# Patient Record
Sex: Female | Born: 1955 | Race: White | Hispanic: No | Smoking: Never smoker
Health system: Southern US, Community
[De-identification: ages and names within clinical notes are randomized; demographics above are authoritative.]

## PROBLEM LIST (undated history)

## (undated) DIAGNOSIS — F32A Depression, unspecified: Secondary | ICD-10-CM

## (undated) DIAGNOSIS — F419 Anxiety disorder, unspecified: Secondary | ICD-10-CM

## (undated) DIAGNOSIS — E119 Type 2 diabetes mellitus without complications: Secondary | ICD-10-CM

## (undated) DIAGNOSIS — E785 Hyperlipidemia, unspecified: Secondary | ICD-10-CM

## (undated) DIAGNOSIS — T7840XA Allergy, unspecified, initial encounter: Secondary | ICD-10-CM

## (undated) DIAGNOSIS — Z5189 Encounter for other specified aftercare: Secondary | ICD-10-CM

## (undated) DIAGNOSIS — E1122 Type 2 diabetes mellitus with diabetic chronic kidney disease: Secondary | ICD-10-CM

## (undated) DIAGNOSIS — M1990 Unspecified osteoarthritis, unspecified site: Secondary | ICD-10-CM

## (undated) DIAGNOSIS — J45909 Unspecified asthma, uncomplicated: Secondary | ICD-10-CM

## (undated) DIAGNOSIS — F329 Major depressive disorder, single episode, unspecified: Secondary | ICD-10-CM

## (undated) DIAGNOSIS — R42 Dizziness and giddiness: Secondary | ICD-10-CM

## (undated) DIAGNOSIS — N1831 Chronic kidney disease, stage 3a: Secondary | ICD-10-CM

## (undated) DIAGNOSIS — G43909 Migraine, unspecified, not intractable, without status migrainosus: Secondary | ICD-10-CM

## (undated) DIAGNOSIS — M810 Age-related osteoporosis without current pathological fracture: Secondary | ICD-10-CM

## (undated) HISTORY — DX: Anxiety disorder, unspecified: F41.9

## (undated) HISTORY — PX: CHOLECYSTECTOMY: SHX55

## (undated) HISTORY — PX: FEMUR FRACTURE SURGERY: SHX633

## (undated) HISTORY — DX: Allergy, unspecified, initial encounter: T78.40XA

## (undated) HISTORY — DX: Age-related osteoporosis without current pathological fracture: M81.0

## (undated) HISTORY — DX: Encounter for other specified aftercare: Z51.89

## (undated) HISTORY — DX: Hyperlipidemia, unspecified: E78.5

## (undated) HISTORY — DX: Major depressive disorder, single episode, unspecified: F32.9

## (undated) HISTORY — PX: TUBAL LIGATION: SHX77

## (undated) HISTORY — PX: ABDOMINAL HYSTERECTOMY: SHX81

## (undated) HISTORY — PX: JOINT REPLACEMENT: SHX530

## (undated) HISTORY — DX: Depression, unspecified: F32.A

## (undated) HISTORY — PX: ARTHROPLASTY: SHX135

## (undated) HISTORY — PX: TOTAL KNEE ARTHROPLASTY: SHX125

---

## 2007-01-10 ENCOUNTER — Ambulatory Visit: Payer: Self-pay

## 2008-02-16 ENCOUNTER — Ambulatory Visit: Payer: Self-pay | Admitting: Family Medicine

## 2008-03-25 ENCOUNTER — Ambulatory Visit: Payer: Self-pay | Admitting: Family Medicine

## 2009-08-03 ENCOUNTER — Ambulatory Visit: Payer: Self-pay

## 2009-08-05 DIAGNOSIS — R0602 Shortness of breath: Secondary | ICD-10-CM | POA: Insufficient documentation

## 2009-10-04 ENCOUNTER — Observation Stay: Payer: Self-pay | Admitting: Internal Medicine

## 2010-06-16 ENCOUNTER — Ambulatory Visit: Payer: Self-pay | Admitting: Family Medicine

## 2010-10-05 ENCOUNTER — Ambulatory Visit: Payer: Self-pay | Admitting: Family Medicine

## 2010-12-30 ENCOUNTER — Emergency Department: Payer: Self-pay | Admitting: Emergency Medicine

## 2011-02-21 ENCOUNTER — Encounter: Payer: Self-pay | Admitting: Orthopedic Surgery

## 2011-08-01 ENCOUNTER — Ambulatory Visit: Payer: Self-pay | Admitting: Family Medicine

## 2011-11-15 ENCOUNTER — Ambulatory Visit: Payer: Self-pay | Admitting: Otolaryngology

## 2011-12-14 ENCOUNTER — Ambulatory Visit: Payer: Self-pay | Admitting: Family Medicine

## 2013-10-23 IMAGING — CR DG FEMUR 2V*L*
1 series · 4 of 4 positions shown · non-contrast
Comparison: none

REASON FOR EXAM: pain, twistiing injury
COMMENTS:

[Series 1: view not recorded · 0.17mm/px · 4 of 4 slices shown]
[im 1/4]
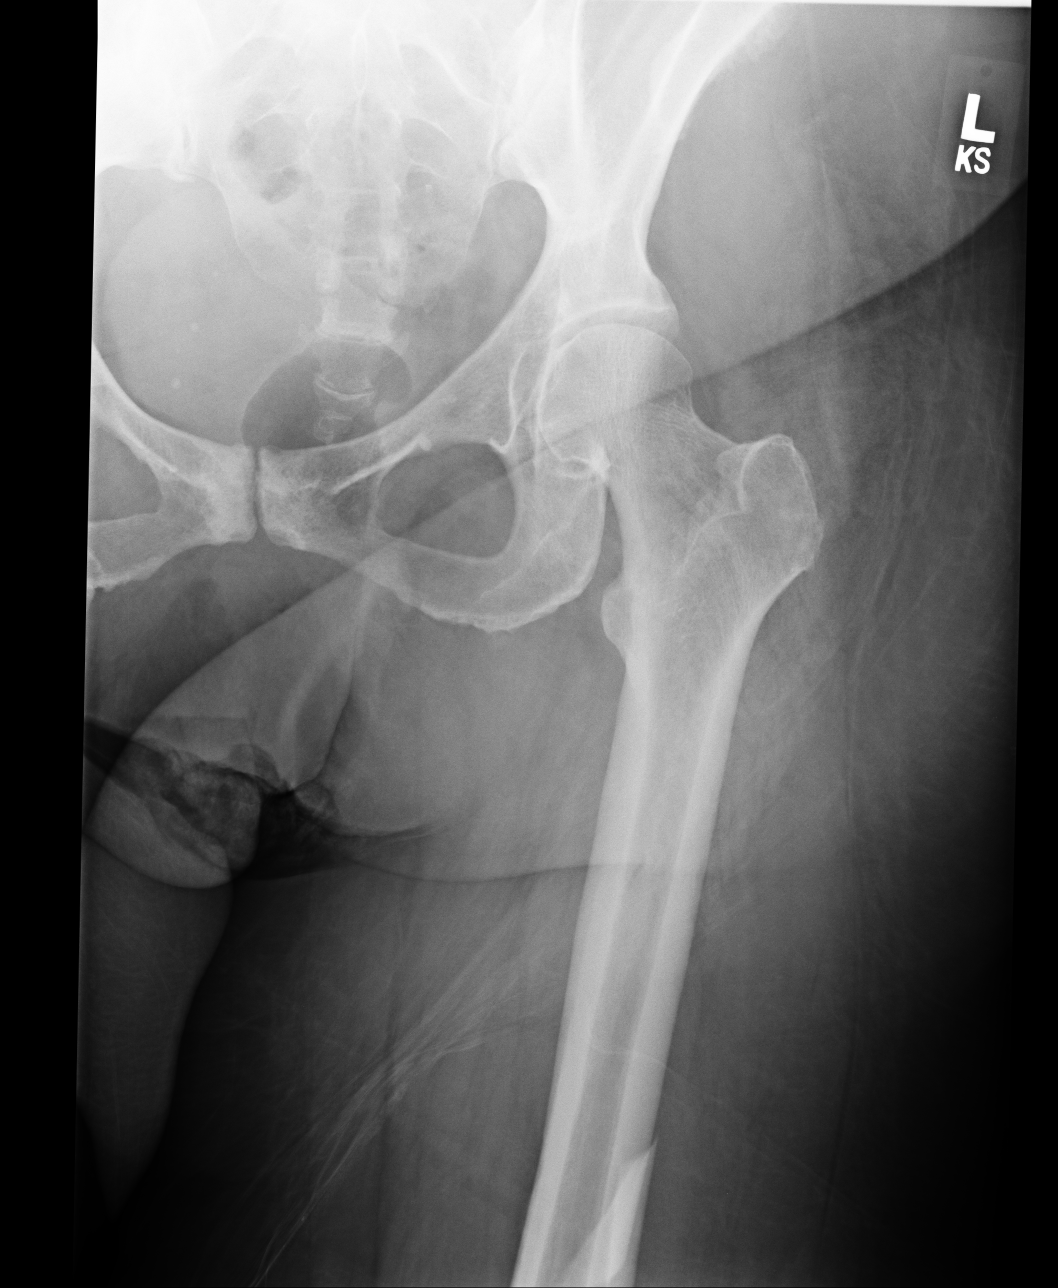
[im 2/4]
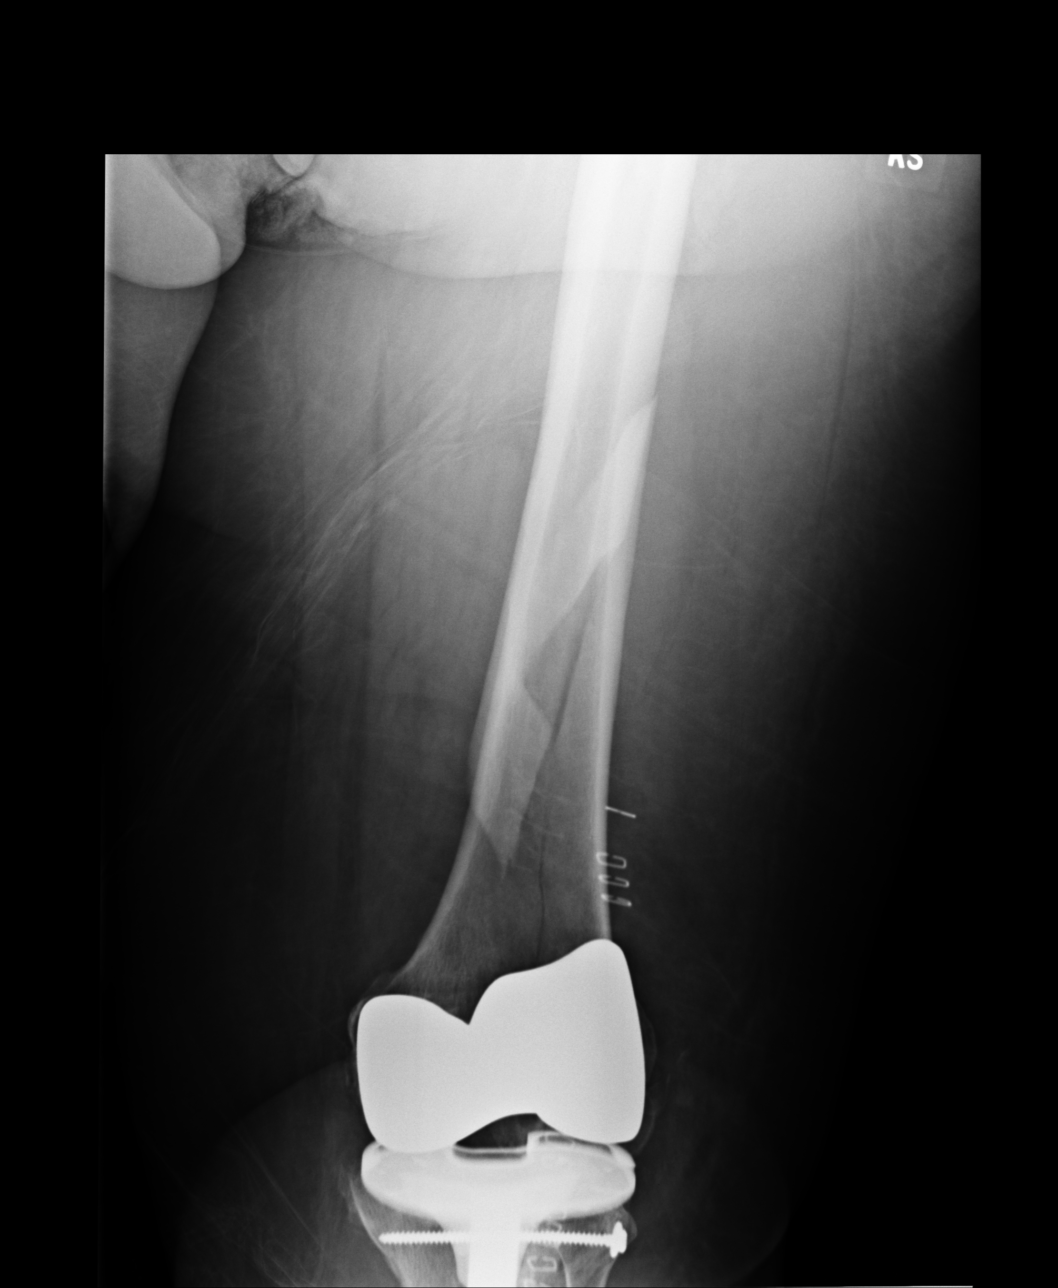
[im 3/4]
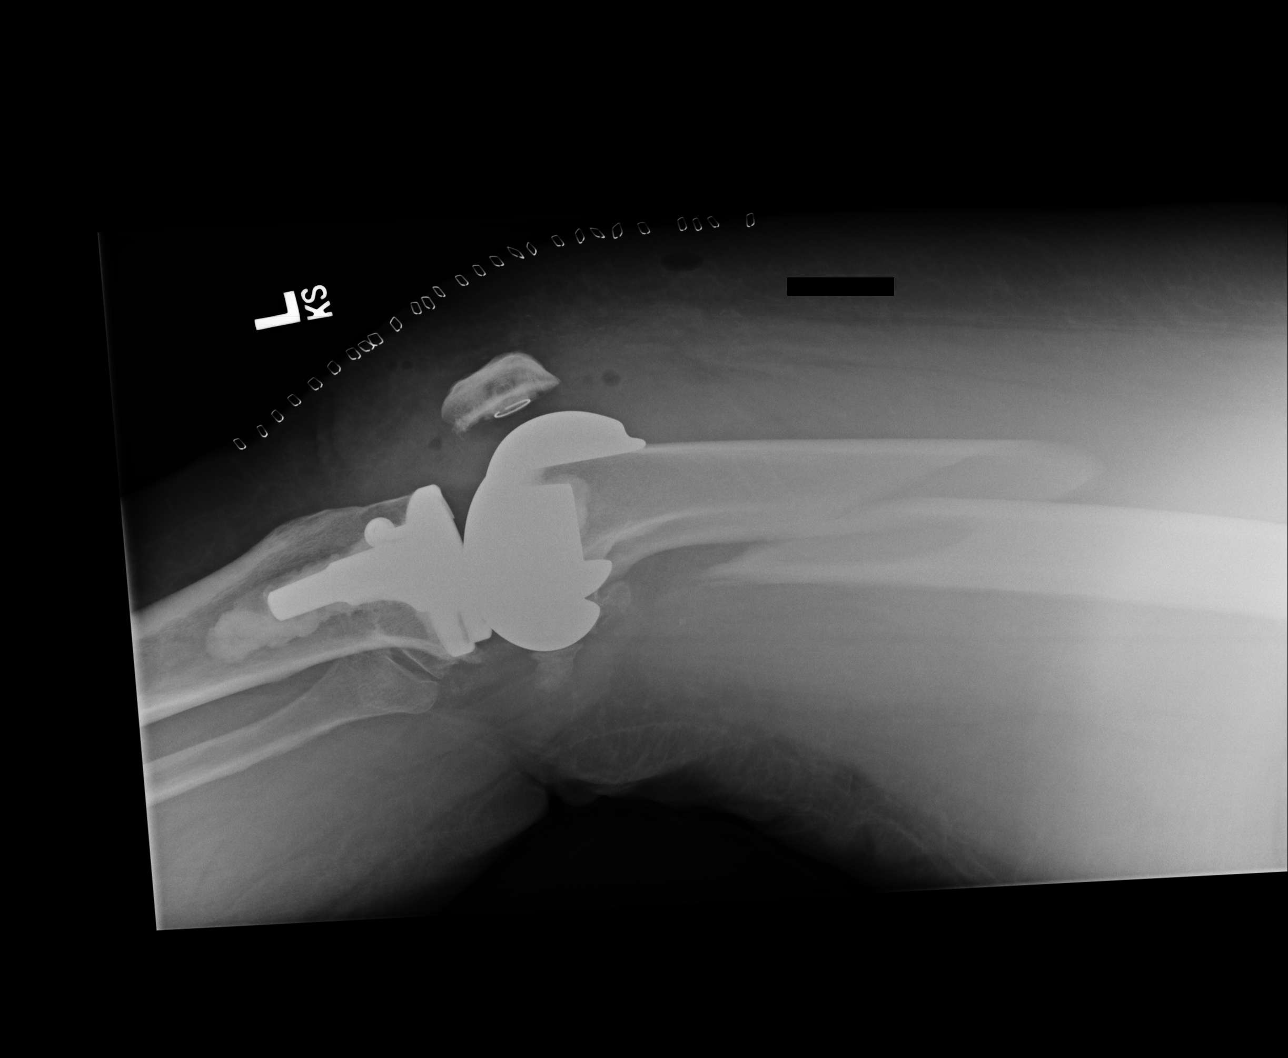
[im 4/4]
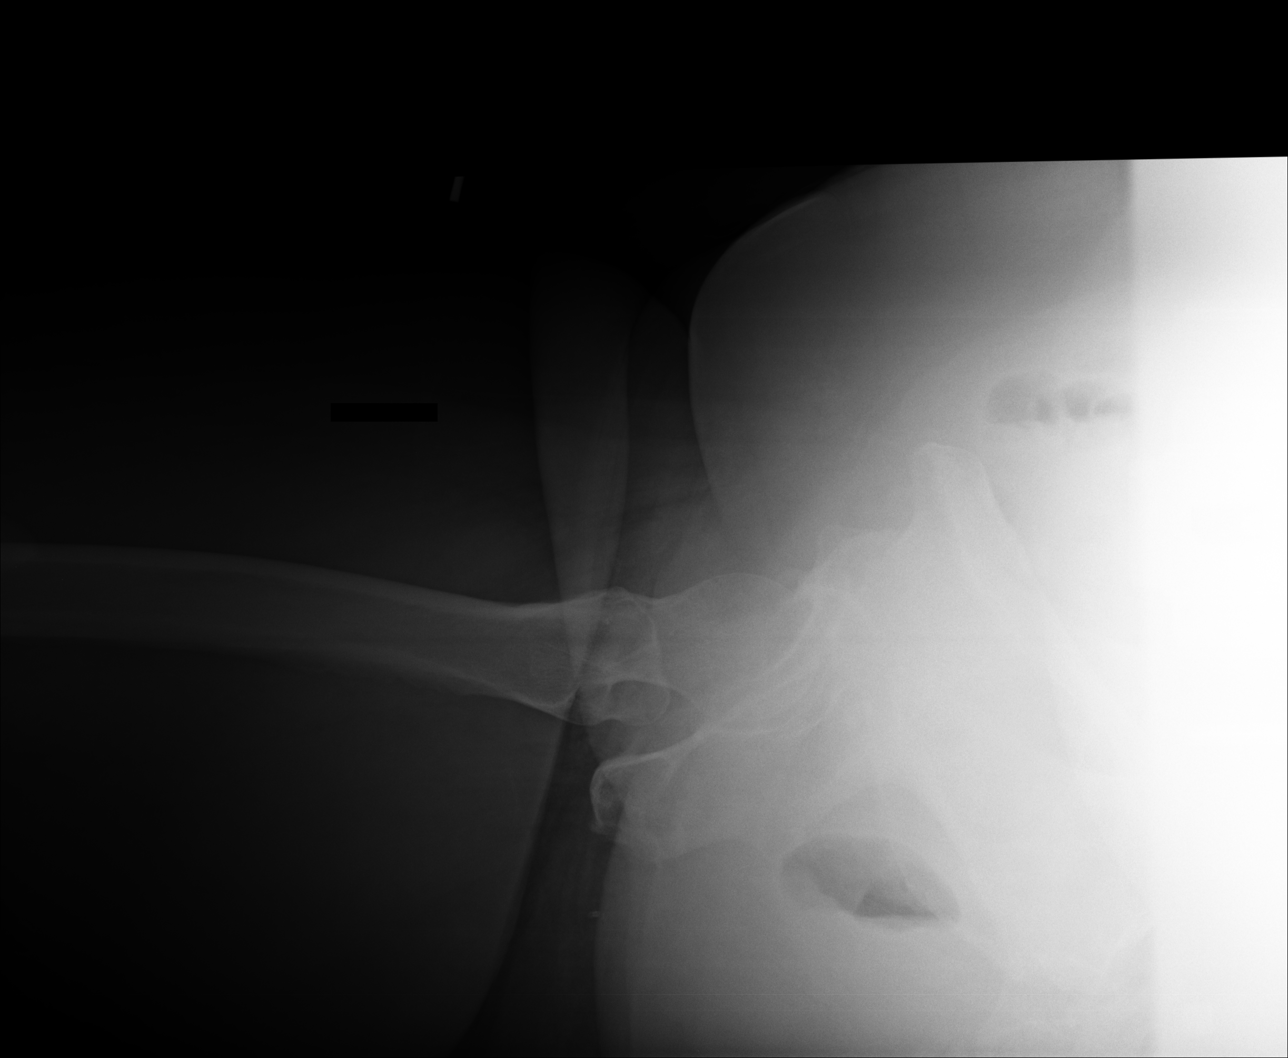

[4 of 4 positions shown; findings below may reference images not displayed]

PROCEDURE:     DXR - DXR FEMUR LEFT  - December 30, 2010  [DATE]

RESULT:     An oblique comminuted distal femoral fracture is identified. A
vertical component extends to the level of the patient's knee prosthesis
lateral femoral condylar component. There does appear to a component of
override. There is no significant displacement or angulation.
IMPRESSION: Distal left femoral fracture. A component of the fracture extends to the
level of the patient's knee prosthesis.

## 2013-10-23 IMAGING — CR DG KNEE COMPLETE 4+V*L*
1 series · 4 of 4 positions shown · non-contrast
Comparison: none

REASON FOR EXAM: pain, twisting injury
COMMENTS:   May transport without cardiac monitor

[Series 1: view not recorded · 0.17mm/px · 4 of 4 slices shown]
[im 1/4]
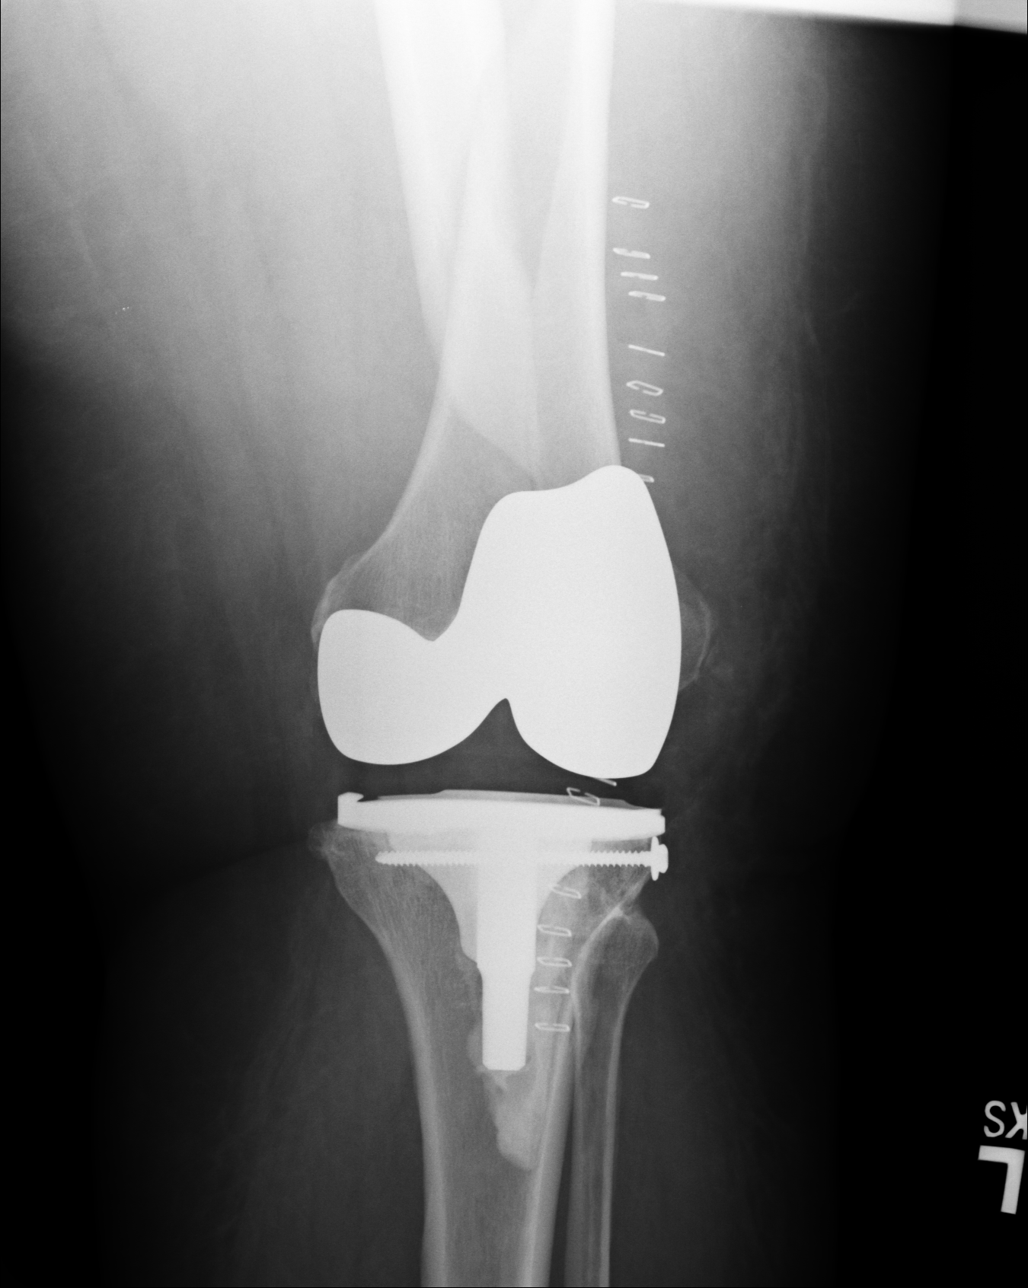
[im 2/4]
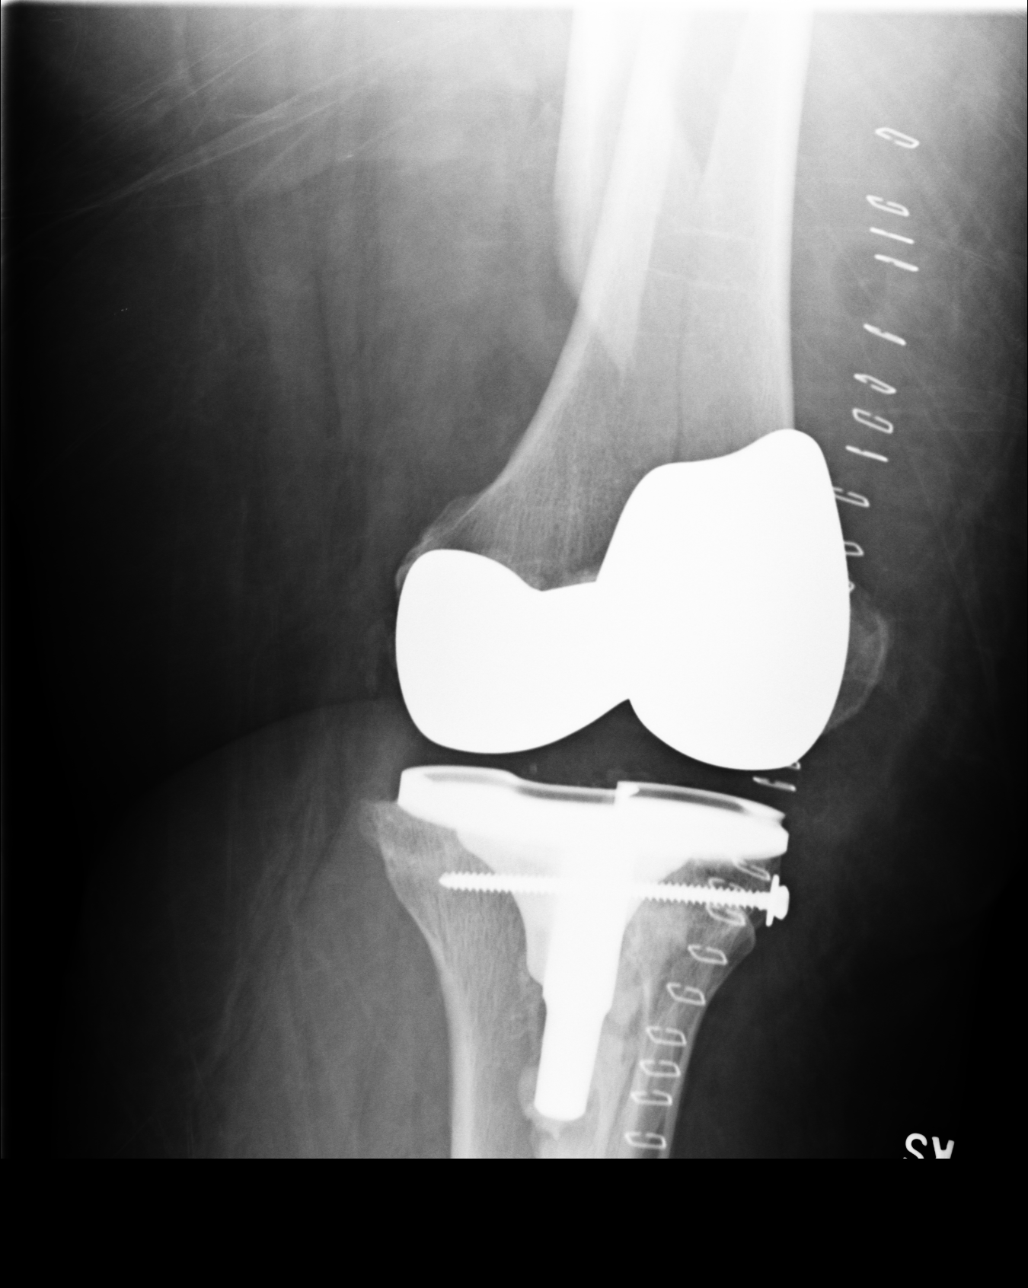
[im 3/4]
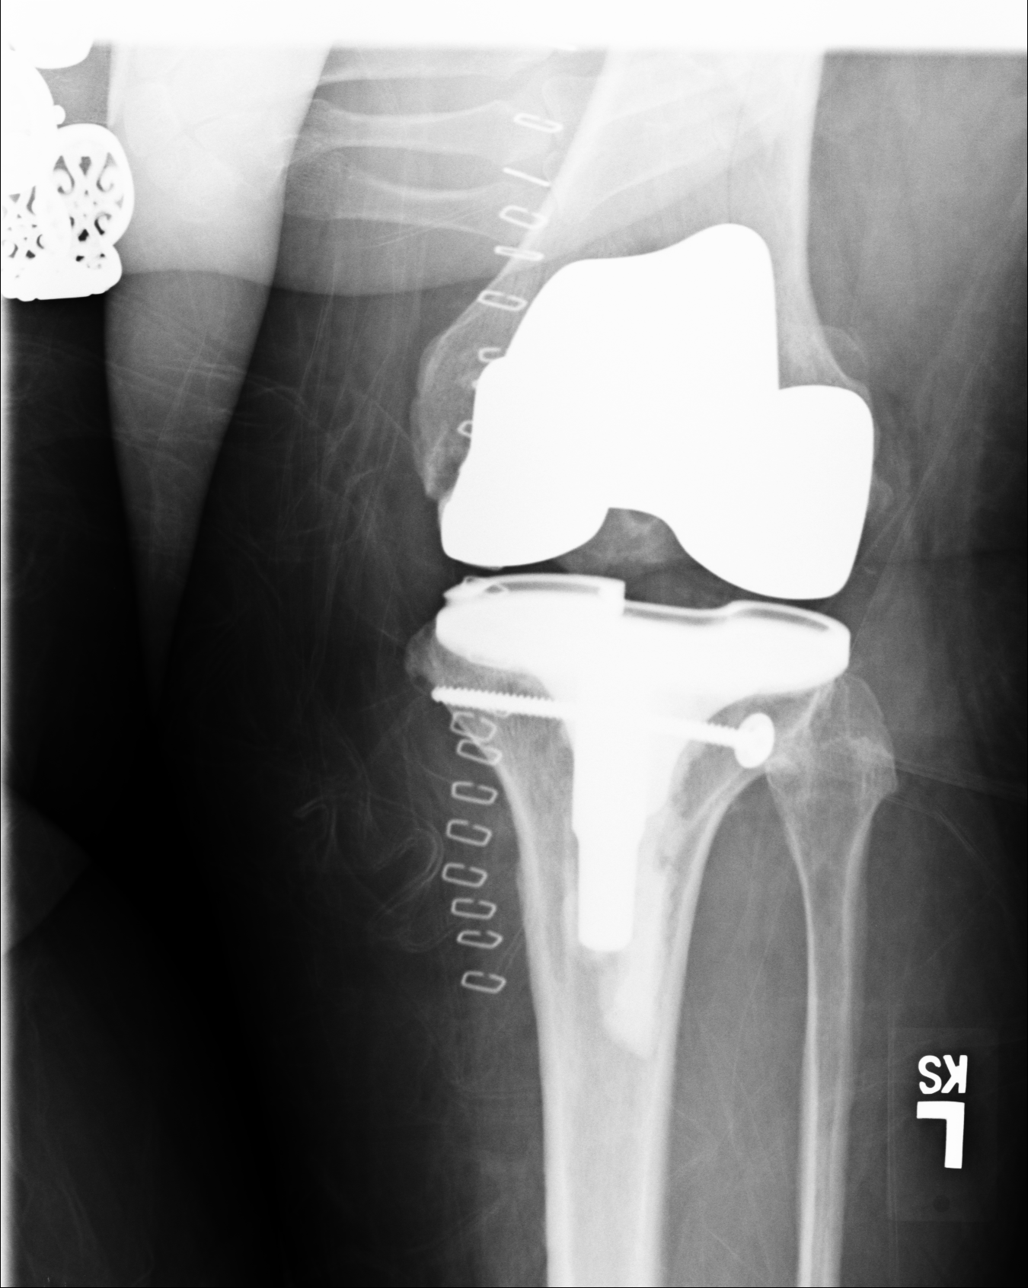
[im 4/4]
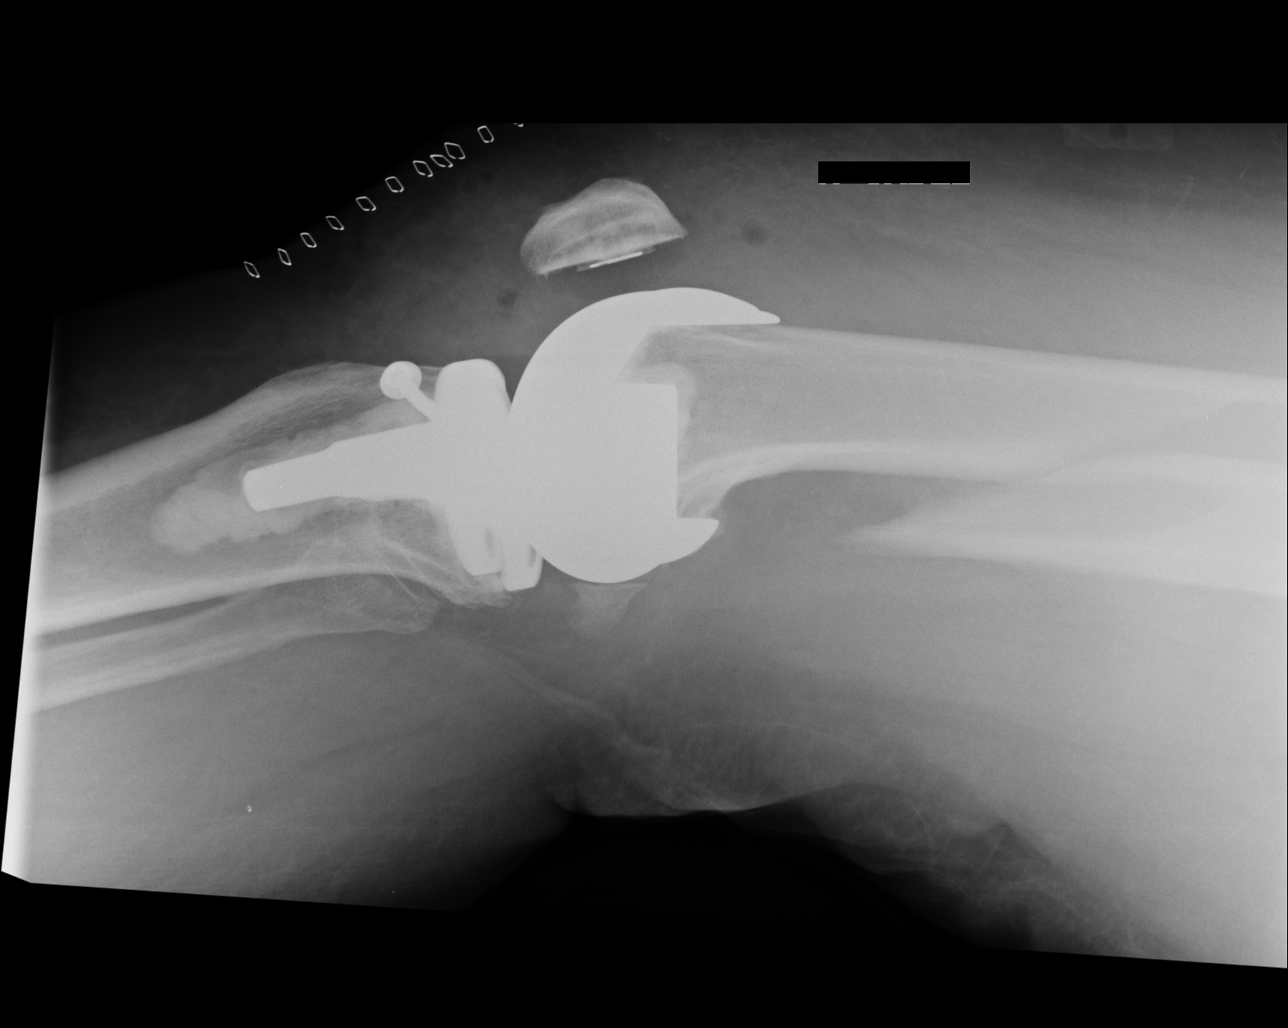

[4 of 4 positions shown; findings below may reference images not displayed]

PROCEDURE:     DXR - DXR KNEE LT COMP WITH OBLIQUES  - December 30, 2010  [DATE]

RESULT:     The patient is status post total knee prostheses. A distal
femoral shaft fracture is partially visualized. A fracture line extends into
the lateral femoral condylar component of the prosthesis. Hardware otherwise
appears intact without evidence of loosening or failure. Skin staples are
identified about the left knee.
IMPRESSION: Partial visualized distal femoral shaft fracture. Left knee otherwise
appears to be intact.

## 2014-08-02 DIAGNOSIS — F329 Major depressive disorder, single episode, unspecified: Secondary | ICD-10-CM | POA: Insufficient documentation

## 2014-08-02 DIAGNOSIS — J45909 Unspecified asthma, uncomplicated: Secondary | ICD-10-CM | POA: Insufficient documentation

## 2014-08-02 DIAGNOSIS — R61 Generalized hyperhidrosis: Secondary | ICD-10-CM | POA: Insufficient documentation

## 2014-08-02 DIAGNOSIS — F419 Anxiety disorder, unspecified: Secondary | ICD-10-CM | POA: Insufficient documentation

## 2014-08-02 DIAGNOSIS — G4700 Insomnia, unspecified: Secondary | ICD-10-CM | POA: Insufficient documentation

## 2014-08-02 DIAGNOSIS — Z8659 Personal history of other mental and behavioral disorders: Secondary | ICD-10-CM | POA: Insufficient documentation

## 2014-08-02 DIAGNOSIS — O039 Complete or unspecified spontaneous abortion without complication: Secondary | ICD-10-CM | POA: Insufficient documentation

## 2014-08-02 DIAGNOSIS — E785 Hyperlipidemia, unspecified: Secondary | ICD-10-CM | POA: Insufficient documentation

## 2014-08-02 DIAGNOSIS — R7303 Prediabetes: Secondary | ICD-10-CM | POA: Insufficient documentation

## 2014-08-02 DIAGNOSIS — E7800 Pure hypercholesterolemia, unspecified: Secondary | ICD-10-CM | POA: Insufficient documentation

## 2014-08-02 DIAGNOSIS — E559 Vitamin D deficiency, unspecified: Secondary | ICD-10-CM | POA: Insufficient documentation

## 2014-08-02 DIAGNOSIS — R091 Pleurisy: Secondary | ICD-10-CM | POA: Insufficient documentation

## 2014-08-03 ENCOUNTER — Encounter: Payer: Self-pay | Admitting: Physician Assistant

## 2014-08-03 ENCOUNTER — Ambulatory Visit (INDEPENDENT_AMBULATORY_CARE_PROVIDER_SITE_OTHER): Payer: BLUE CROSS/BLUE SHIELD | Admitting: Physician Assistant

## 2014-08-03 VITALS — BP 132/74 | HR 102 | Temp 98.3°F | Resp 16 | Wt 246.2 lb

## 2014-08-03 DIAGNOSIS — R399 Unspecified symptoms and signs involving the genitourinary system: Secondary | ICD-10-CM | POA: Diagnosis not present

## 2014-08-03 DIAGNOSIS — J4521 Mild intermittent asthma with (acute) exacerbation: Secondary | ICD-10-CM

## 2014-08-03 LAB — POCT URINALYSIS DIPSTICK
Bilirubin, UA: NEGATIVE
Blood, UA: NEGATIVE
Glucose, UA: NEGATIVE
KETONES UA: NEGATIVE
Leukocytes, UA: NEGATIVE
NITRITE UA: NEGATIVE
PH UA: 6.5
Protein, UA: NEGATIVE
Urobilinogen, UA: 0.2

## 2014-08-03 MED ORDER — ALBUTEROL SULFATE HFA 108 (90 BASE) MCG/ACT IN AERS
2.0000 | INHALATION_SPRAY | RESPIRATORY_TRACT | Status: DC | PRN
Start: 2014-08-03 — End: 2014-12-09

## 2014-08-03 MED ORDER — HYDROCODONE-HOMATROPINE 5-1.5 MG/5ML PO SYRP: 5.0000 mL | ORAL_SOLUTION | Freq: Three times a day (TID) | ORAL | Status: DC | PRN

## 2014-08-03 MED ORDER — CIPROFLOXACIN HCL 500 MG PO TABS: 500.0000 mg | ORAL_TABLET | Freq: Two times a day (BID) | ORAL | Status: DC

## 2014-08-03 NOTE — Progress Notes (Signed)
Subjective:    Patient ID: Joanna Bishop, female    DOB: 09/24/1955, 59 y.o.   MRN: [id number]  Urinary Tract Infection  This is a new problem. The current episode started yesterday. The problem occurs every urination. The problem has been gradually improving. The quality of the pain is described as aching (foul odor and back pain). The pain is at a severity of 3/10. The pain is mild. The maximum temperature recorded prior to her arrival was 101 - 101.9 F. The fever has been present for less than 1 day. She is sexually active. There is no history of pyelonephritis. Pertinent negatives include no chills, discharge, flank pain, frequency, hematuria, hesitancy, nausea, possible pregnancy, sweats, urgency or vomiting. Associated symptoms comments: Foul odor. She has tried increased fluids for the symptoms. The treatment provided mild relief. There is no history of catheterization, kidney stones, recurrent UTIs, a single kidney, urinary stasis or a urological procedure.  Cough This is a chronic problem. The current episode started more than 1 year ago. The problem has been gradually worsening. The problem occurs every few minutes. The cough is productive of purulent sputum. Associated symptoms include a fever, shortness of breath and wheezing. Pertinent negatives include no chest pain, chills, ear congestion, ear pain, headaches, heartburn, hemoptysis, myalgias, nasal congestion, postnasal drip, rash, rhinorrhea, sore throat, sweats or weight loss. The symptoms are aggravated by lying down. She has tried a beta-agonist inhaler and rest for the symptoms. The treatment provided mild relief. Her past medical history is significant for asthma, bronchitis and environmental allergies. There is no history of bronchiectasis, COPD, emphysema or pneumonia.      Review of Systems  Constitutional: Positive for fever. Negative for chills and weight loss.  HENT: Negative for ear pain, postnasal drip, rhinorrhea and  sore throat.   Respiratory: Positive for cough, shortness of breath and wheezing. Negative for hemoptysis.   Cardiovascular: Negative for chest pain.  Gastrointestinal: Negative for heartburn, nausea and vomiting.  Genitourinary: Negative for hesitancy, urgency, frequency, hematuria and flank pain.  Musculoskeletal: Positive for back pain (low back pain with urination). Negative for myalgias.  Skin: Negative for rash.  Allergic/Immunologic: Positive for environmental allergies.  Neurological: Negative for weakness, numbness and headaches.       Objective:   Physical Exam  Constitutional: She appears well-developed and well-nourished. No distress.  HENT:  Head: Normocephalic and atraumatic.  Right Ear: Hearing, tympanic membrane, external ear and ear canal normal.  Left Ear: Hearing, tympanic membrane, external ear and ear canal normal.  Nose: Nose normal. Right sinus exhibits no maxillary sinus tenderness and no frontal sinus tenderness. Left sinus exhibits no maxillary sinus tenderness and no frontal sinus tenderness.  Mouth/Throat: Uvula is midline, oropharynx is clear and moist and mucous membranes are normal. No oropharyngeal exudate.  Neck: Normal range of motion. Neck supple. No tracheal deviation present.  Cardiovascular: Regular rhythm.  Tachycardia present.  Exam reveals no gallop and no friction rub.   No murmur heard. Pulmonary/Chest: Effort normal. No stridor. No respiratory distress. She has no wheezes. She has rales (RUL). She exhibits no tenderness.  Lymphadenopathy:    She has no cervical adenopathy.  Skin: She is not diaphoretic.  Vitals reviewed.         Assessment & Plan:  1. AB (asthmatic bronchitis), mild intermittent, with acute exacerbation Worsening with increased sputum production.  Will treat as below.  She is to call the office if symptoms fail to improve.  - albuterol (PROVENTIL  HFA;VENTOLIN HFA) 108 (90 BASE) MCG/ACT inhaler; Inhale 2 puffs into the  lungs every 4 (four) hours as needed for wheezing or shortness of breath. Every 4-6 hours as needed  Dispense: 1 Inhaler; Refill: 6 - HYDROcodone-homatropine (HYCODAN) 5-1.5 MG/5ML syrup; Take 5 mLs by mouth every 8 (eight) hours as needed for cough.  Dispense: 120 mL; Refill: 0 - ciprofloxacin (CIPRO) 500 MG tablet; Take 1 tablet (500 mg total) by mouth 2 (two) times daily.  Dispense: 20 tablet; Refill: 0  2. Urinary tract infection symptoms UA was negative for UTI.  Will treat with cipro as below due to being symptomatic.  - ciprofloxacin (CIPRO) 500 MG tablet; Take 1 tablet (500 mg total) by mouth 2 (two) times daily.  Dispense: 20 tablet; Refill: 0 - POCT Urinalysis Dipstick

## 2014-08-03 NOTE — Patient Instructions (Signed)
Chronic Asthmatic Bronchitis Chronic asthmatic bronchitis is a complication of persistent asthma. After a period of time with asthma, some people develop airflow obstruction that is present all the time, even when not having an asthma attack.There is also persistent inflammation of the airways, and the bronchial tubes produce more mucus. Chronic asthmatic bronchitis usually is a permanent problem with the lungs. CAUSES  Chronic asthmatic bronchitis happens most often in people who have asthma and also smoke cigarettes. Occasionally, it can happen to a person with long-standing or severe asthma even if the person is not a smoker. SIGNS AND SYMPTOMS  Chronic asthmatic bronchitis usually causes symptoms of both asthma and chronic bronchitis, including:   Coughing.  Increased sputum production.  Wheezing and shortness of breath.  Chest discomfort.  Recurring infections. DIAGNOSIS  Your health care provider will take a medical history and perform a physical exam. Chronic asthmatic bronchitis is suspected when a person with asthma has abnormal results on breathing tests (pulmonary function tests) even when breathing symptoms are at their best. Other tests, such as a chest X-ray, may be performed to rule out other conditions.  TREATMENT  Treatment involves controlling symptoms with medicine and lifestyle changes.  Your health care provider may prescribe asthma medicines, including inhaler and nebulizer medicines.  Infection can be treated with medicine to kill germs (antibiotics). Serious infections may require hospitalization. These can include:  Pneumonia.  Sinus infections.  Acute bronchitis.   Preventing infection and hospitalization is very important. Get an influenza vaccination every year as directed by your health care provider. Ask your health care provider whether you need a pneumonia vaccine.  Ask your health care provider whether you would benefit from a pulmonary  rehabilitation program. HOME CARE INSTRUCTIONS  Take medicines only as directed by your health care provider.  If you are a cigarette smoker, the most important thing that you can do is quit. Talk to your health care provider for help with quitting smoking.  Avoid pollen, dust, animal dander, molds, smoke, and other things that cause attacks.  Regular exercise is very important to help you feel better. Discuss possible exercise routines with your health care provider.  If animal dander is the cause of asthma, you may not be able to keep pets.  It is important that you:  Become educated about your medical condition.  Participate in maintaining wellness.  Seek medical care as directed. Delay in seeking medical care could cause permanent injury and may be a risk to your life. SEEK MEDICAL CARE IF:  You have wheezing and shortness of breath even if taking medicine to prevent attacks.  You have muscle aches, chest pain, or thickening of sputum.  Your sputum changes from clear or white to yellow, green, gray, or bloody. SEEK IMMEDIATE MEDICAL CARE IF:  Your usual medicines do not stop your wheezing.  You have increased coughing or shortness of breath or both.  You have increased difficulty breathing.  You have any problems from the medicine you are taking, such as a rash, itching, swelling, or trouble breathing. MAKE SURE YOU:   Understand these instructions.  Will watch your condition.  Will get help right away if you are not doing well or get worse. Document Released: 11/30/2005 Document Revised: 06/29/2013 Document Reviewed: 03/23/2013 [employer] Patient Information 2015 [employer]. This information is not intended to replace advice given to you by your health care provider. Make sure you discuss any questions you have with your health care provider.  

## 2014-08-06 ENCOUNTER — Other Ambulatory Visit: Payer: Self-pay

## 2014-08-09 MED ORDER — SERTRALINE HCL 100 MG PO TABS: 150.0000 mg | ORAL_TABLET | Freq: Every day | ORAL | Status: DC

## 2014-09-09 ENCOUNTER — Encounter: Payer: BLUE CROSS/BLUE SHIELD | Admitting: Physician Assistant

## 2014-09-10 ENCOUNTER — Encounter: Payer: Self-pay | Admitting: Physician Assistant

## 2014-09-10 ENCOUNTER — Ambulatory Visit (INDEPENDENT_AMBULATORY_CARE_PROVIDER_SITE_OTHER): Payer: BLUE CROSS/BLUE SHIELD | Admitting: Physician Assistant

## 2014-09-10 VITALS — BP 102/60 | HR 80 | Temp 97.9°F | Resp 20 | Wt 246.0 lb

## 2014-09-10 DIAGNOSIS — E7800 Pure hypercholesterolemia, unspecified: Secondary | ICD-10-CM

## 2014-09-10 DIAGNOSIS — Z1211 Encounter for screening for malignant neoplasm of colon: Secondary | ICD-10-CM

## 2014-09-10 DIAGNOSIS — Z0000 Encounter for general adult medical examination without abnormal findings: Secondary | ICD-10-CM

## 2014-09-10 DIAGNOSIS — R7303 Prediabetes: Secondary | ICD-10-CM

## 2014-09-10 DIAGNOSIS — R7309 Other abnormal glucose: Secondary | ICD-10-CM

## 2014-09-10 DIAGNOSIS — Z1239 Encounter for other screening for malignant neoplasm of breast: Secondary | ICD-10-CM

## 2014-09-10 NOTE — Progress Notes (Signed)
Patient ID: Joanna Bishop, female   DOB: 1955-06-20, 59 y.o.   MRN: [id number]        Patient: Joanna Bishop Female    DOB: July 27, 1955   59 y.o.   MRN: [id number] Visit Date: 09/10/2014  Today's Provider: Margaretann Loveless, PA-C   Chief Complaint  Patient presents with  . Annual Exam   Subjective:    HPI Patient is here for her annual physical exam. She feels well with no complaints.  She is status post hysterectomy with complete removal of the uterus and cervix. The hysterectomy was done for endometriosis and heavy menses.  She never had an abnormal Pap smear. She is due for her colonoscopy. There is a positive family history for colon cancer in her maternal grandfather. She also has not had a mammogram in a couple of years. There is no family history of breast cancer. She has been diagnosed as being a borderline diabetic and does have a positive family history for diabetes. She also has a history of hypercholesterolemia and hypertriglyceridemia.    Allergies  Allergen Reactions  . Penicillins    Previous Medications   ALBUTEROL (PROVENTIL HFA;VENTOLIN HFA) 108 (90 BASE) MCG/ACT INHALER    Inhale 2 puffs into the lungs every 4 (four) hours as needed for wheezing or shortness of breath. Every 4-6 hours as needed   ALPRAZOLAM (XANAX) 0.5 MG TABLET    Take 1 tablet by mouth every 8 (eight) hours as needed.   ASPIRIN 81 MG TABLET    Take by mouth daily.   BUPROPION (WELLBUTRIN XL) 150 MG 24 HR TABLET    Take 1 tablet by mouth daily.   CALCIUM-MAGNESIUM-VITAMIN D (CALCIUM 500 PO)    Take 1 tablet by mouth 2 (two) times daily.   CHOLECALCIFEROL (VITAMIN D) 1000 UNITS TABLET    Take 1 tablet by mouth 2 (two) times daily.   CIPROFLOXACIN (CIPRO) 500 MG TABLET    Take 1 tablet (500 mg total) by mouth 2 (two) times daily.   CLOTRIMAZOLE-BETAMETHASONE (LOTRISONE) CREAM    1 application 2 (two) times daily.   FLUTICASONE (FLONASE) 50 MCG/ACT NASAL SPRAY    Place 2 sprays into the nose daily.   FLUTICASONE-SALMETEROL (ADVAIR) 250-50 MCG/DOSE AEPB    Inhale 1 puff into the lungs 2 (two) times daily.   HYDROCODONE-HOMATROPINE (HYCODAN) 5-1.5 MG/5ML SYRUP    Take 5 mLs by mouth every 8 (eight) hours as needed for cough.   LORATADINE (CLARITIN) 10 MG TABLET    Take 1 tablet by mouth daily.   MONTELUKAST (SINGULAIR) 10 MG TABLET    Take 1 tablet by mouth daily.   MULTIPLE VITAMIN PO    Take 1 tablet by mouth daily.   OMEGA-3 FATTY ACIDS PO    Take 1 tablet by mouth daily.   SERTRALINE (ZOLOFT) 100 MG TABLET    Take 1.5 tablets (150 mg total) by mouth daily.    Review of Systems  Constitutional: Positive for diaphoresis and fatigue. Negative for fever, chills and unexpected weight change.  HENT: Positive for mouth sores and trouble swallowing. Negative for congestion, dental problem, ear discharge, ear pain, hearing loss, nosebleeds, postnasal drip, rhinorrhea, sinus pressure, sneezing, sore throat, tinnitus and voice change.   Eyes: Negative.   Respiratory: Positive for apnea, chest tightness, shortness of breath and wheezing. Negative for cough, choking and stridor.        Has asthma.   Cardiovascular: Negative.   Gastrointestinal: Negative.   Endocrine: Positive  for heat intolerance, polydipsia and polyphagia. Negative for polyuria.  Genitourinary: Positive for urgency and enuresis. Negative for dysuria, frequency, hematuria, vaginal bleeding, vaginal discharge, vaginal pain, menstrual problem and pelvic pain.  Musculoskeletal: Positive for myalgias, arthralgias, gait problem (left leg is shorter than right due to surgeries), neck pain and neck stiffness.  Skin: Negative.   Neurological: Positive for light-headedness and headaches. Negative for dizziness, syncope, speech difficulty, weakness and numbness.  Hematological: Negative.   Psychiatric/Behavioral: Positive for decreased concentration. Negative for suicidal ideas, hallucinations, behavioral problems, confusion, sleep  disturbance, self-injury, dysphoric mood and agitation. The patient is not nervous/anxious.     History  Substance Use Topics  . Smoking status: Never Smoker   . Smokeless tobacco: Never Used  . Alcohol Use: No   Objective:   BP 102/60 mmHg  Pulse 80  Temp(Src) 97.9 F (36.6 C)  Resp 20  Wt 246 lb (111.585 kg)  Physical Exam  Constitutional: She is oriented to person, place, and time. She appears well-developed and well-nourished. No distress.  HENT:  Head: Normocephalic and atraumatic.  Right Ear: External ear normal.  Left Ear: External ear normal.  Nose: Nose normal.  Mouth/Throat: Oropharynx is clear and moist. No oropharyngeal exudate.  Eyes: Conjunctivae and EOM are normal. Pupils are equal, round, and reactive to light. Right eye exhibits no discharge. Left eye exhibits no discharge. No scleral icterus.  Neck: Normal range of motion. Neck supple. No JVD present. No tracheal deviation present. No thyromegaly present.  Cardiovascular: Normal rate, regular rhythm, normal heart sounds and intact distal pulses.  Exam reveals no gallop and no friction rub.   No murmur heard. Pulmonary/Chest: Effort normal and breath sounds normal. No respiratory distress. She has no wheezes. She has no rales. She exhibits no tenderness. Right breast exhibits no inverted nipple, no mass, no nipple discharge, no skin change and no tenderness. Left breast exhibits no inverted nipple, no mass, no nipple discharge, no skin change and no tenderness. Breasts are symmetrical.  Abdominal: Soft. Bowel sounds are normal. She exhibits no distension and no mass. There is no tenderness. There is no rebound and no guarding.  Musculoskeletal: Normal range of motion. She exhibits no edema or tenderness.  Lymphadenopathy:    She has no cervical adenopathy.  Neurological: She is alert and oriented to person, place, and time.  Skin: Skin is warm and dry. No rash noted. She is not diaphoretic.  Psychiatric: She has a  normal mood and affect. Her behavior is normal. Judgment and thought content normal.  Vitals reviewed.       Assessment & Plan:     1. Routine general medical examination at a health care facility Will check labs and f/u pending labs or in 6 months. - CBC with Differential - Comprehensive metabolic panel - TSH  2. Borderline diabetes Will check labs and f/u pending labs or in 6 months. - CBC with Differential - Comprehensive metabolic panel - Hemoglobin A1c  3. Hypercholesteremia Will check labs as they have not been checked since 2013.  F/U pending labs or in 6 months. - Lipid panel  4. Breast cancer screening - Mammogram Digital Screening; Future  5. Colon cancer screening - Ambulatory referral to Gastroenterology         Margaretann LovelessJennifer M Burnette, PA-C  [facility]

## 2014-09-10 NOTE — Patient Instructions (Signed)
Health Maintenance Adopting a healthy lifestyle and getting preventive care can go a long way to promote health and wellness. Talk with your health care provider about what schedule of regular examinations is right for you. This is a good chance for you to check in with your provider about disease prevention and staying healthy. In between checkups, there are plenty of things you can do on your own. Experts have done a lot of research about which lifestyle changes and preventive measures are most likely to keep you healthy. Ask your health care provider for more information. WEIGHT AND DIET  Eat a healthy diet 1. Be sure to include plenty of vegetables, fruits, low-fat dairy products, and lean protein. 2. Do not eat a lot of foods high in solid fats, added sugars, or salt. 3. Get regular exercise. This is one of the most important things you can do for your health. 1. Most adults should exercise for at least 150 minutes each week. The exercise should increase your heart rate and make you sweat (moderate-intensity exercise). 2. Most adults should also do strengthening exercises at least twice a week. This is in addition to the moderate-intensity exercise.  Maintain a healthy weight 1. Body mass index (BMI) is a measurement that can be used to identify possible weight problems. It estimates body fat based on height and weight. Your health care provider can help determine your BMI and help you achieve or maintain a healthy weight. 2. For females 6 years of age and older:  1. A BMI below 18.5 is considered underweight. 2. A BMI of 18.5 to 24.9 is normal. 3. A BMI of 25 to 29.9 is considered overweight. 4. A BMI of 30 and above is considered obese.  Watch levels of cholesterol and blood lipids 1. You should start having your blood tested for lipids and cholesterol at 57 years of age, then have this test every 5 years. 2. You may need to have your cholesterol levels checked more often if: 1. Your  lipid or cholesterol levels are high. 2. You are older than 57 years of age. 3. You are at high risk for heart disease.  CANCER SCREENING   Lung Cancer 1. Lung cancer screening is recommended for adults 7-87 years old who are at high risk for lung cancer because of a history of smoking. 2. A yearly low-dose CT scan of the lungs is recommended for people who: 1. Currently smoke. 2. Have quit within the past 15 years. 3. Have at least a 30-pack-year history of smoking. A pack year is smoking an average of one pack of cigarettes a day for 1 year. 3. Yearly screening should continue until it has been 15 years since you quit. 4. Yearly screening should stop if you develop a health problem that would prevent you from having lung cancer treatment.  Breast Cancer  Practice breast self-awareness. This means understanding how your breasts normally appear and feel.  It also means doing regular breast self-exams. Let your health care provider know about any changes, no matter how small.  If you are in your 20s or 30s, you should have a clinical breast exam (CBE) by a health care provider every 1-3 years as part of a regular health exam.  If you are 30 or older, have a CBE every year. Also consider having a breast X-Madlock (mammogram) every year.  If you have a family history of breast cancer, talk to your health care provider about genetic screening.  If you are  at high risk for breast cancer, talk to your health care provider about having an MRI and a mammogram every year.  Breast cancer gene (BRCA) assessment is recommended for women who have family members with BRCA-related cancers. BRCA-related cancers include:  Breast.  Ovarian.  Tubal.  Peritoneal cancers.  Results of the assessment will determine the need for genetic counseling and BRCA1 and BRCA2 testing. Cervical Cancer Routine pelvic examinations to screen for cervical cancer are no longer recommended for nonpregnant women who  are considered low risk for cancer of the pelvic organs (ovaries, uterus, and vagina) and who do not have symptoms. A pelvic examination may be necessary if you have symptoms including those associated with pelvic infections. Ask your health care provider if a screening pelvic exam is right for you.   The Pap test is the screening test for cervical cancer for women who are considered at risk.  If you had a hysterectomy for a problem that was not cancer or a condition that could lead to cancer, then you no longer need Pap tests.  If you are older than 65 years, and you have had normal Pap tests for the past 10 years, you no longer need to have Pap tests.  If you have had past treatment for cervical cancer or a condition that could lead to cancer, you need Pap tests and screening for cancer for at least 20 years after your treatment.  If you no longer get a Pap test, assess your risk factors if they change (such as having a new sexual partner). This can affect whether you should start being screened again.  Some women have medical problems that increase their chance of getting cervical cancer. If this is the case for you, your health care provider may recommend more frequent screening and Pap tests.  The human papillomavirus (HPV) test is another test that may be used for cervical cancer screening. The HPV test looks for the virus that can cause cell changes in the cervix. The cells collected during the Pap test can be tested for HPV.  The HPV test can be used to screen women 2 years of age and older. Getting tested for HPV can extend the interval between normal Pap tests from three to five years.  An HPV test also should be used to screen women of any age who have unclear Pap test results.  After 57 years of age, women should have HPV testing as often as Pap tests.  Colorectal Cancer  This type of cancer can be detected and often prevented.  Routine colorectal cancer screening usually  begins at 57 years of age and continues through 57 years of age.  Your health care provider may recommend screening at an earlier age if you have risk factors for colon cancer.  Your health care provider may also recommend using home test kits to check for hidden blood in the stool.  A small camera at the end of a tube can be used to examine your colon directly (sigmoidoscopy or colonoscopy). This is done to check for the earliest forms of colorectal cancer.  Routine screening usually begins at age 57.  Direct examination of the colon should be repeated every 5-10 years through 57 years of age. However, you may need to be screened more often if early forms of precancerous polyps or small growths are found. Skin Cancer  Check your skin from head to toe regularly.  Tell your health care provider about any new moles or changes in  moles, especially if there is a change in a mole's shape or color.  Also tell your health care provider if you have a mole that is larger than the size of a pencil eraser.  Always use sunscreen. Apply sunscreen liberally and repeatedly throughout the day.  Protect yourself by wearing long sleeves, pants, a wide-brimmed hat, and sunglasses whenever you are outside. HEART DISEASE, DIABETES, AND HIGH BLOOD PRESSURE   Have your blood pressure checked at least every 1-2 years. High blood pressure causes heart disease and increases the risk of stroke.  If you are between 32 years and 30 years old, ask your health care provider if you should take aspirin to prevent strokes.  Have regular diabetes screenings. This involves taking a blood sample to check your fasting blood sugar level.  If you are at a normal weight and have a low risk for diabetes, have this test once every three years after 57 years of age.  If you are overweight and have a high risk for diabetes, consider being tested at a younger age or more often. PREVENTING INFECTION  Hepatitis B  If you have a  higher risk for hepatitis B, you should be screened for this virus. You are considered at high risk for hepatitis B if:  You were born in a country where hepatitis B is common. Ask your health care provider which countries are considered high risk.  Your parents were born in a high-risk country, and you have not been immunized against hepatitis B (hepatitis B vaccine).  You have HIV or AIDS.  You use needles to inject street drugs.  You live with someone who has hepatitis B.  You have had sex with someone who has hepatitis B.  You get hemodialysis treatment.  You take certain medicines for conditions, including cancer, organ transplantation, and autoimmune conditions. Hepatitis C  Blood testing is recommended for:  Everyone born from [birth year] through 1963.  Anyone with known risk factors for hepatitis C. Sexually transmitted infections (STIs)  You should be screened for sexually transmitted infections (STIs) including gonorrhea and chlamydia if:  You are sexually active and are younger than 57 years of age.  You are older than 57 years of age and your health care provider tells you that you are at risk for this type of infection.  Your sexual activity has changed since you were last screened and you are at an increased risk for chlamydia or gonorrhea. Ask your health care provider if you are at risk.  If you do not have HIV, but are at risk, it may be recommended that you take a prescription medicine daily to prevent HIV infection. This is called pre-exposure prophylaxis (PrEP). You are considered at risk if:  You are sexually active and do not regularly use condoms or know the HIV status of your partner(s).  You take drugs by injection.  You are sexually active with a partner who has HIV. Talk with your health care provider about whether you are at high risk of being infected with HIV. If you choose to begin PrEP, you should first be tested for HIV. You should then be tested  every 3 months for as long as you are taking PrEP.  PREGNANCY   If you are premenopausal and you may become pregnant, ask your health care provider about preconception counseling.  If you may become pregnant, take 400 to 800 micrograms (mcg) of folic acid every day.  If you want to prevent pregnancy, talk to your  health care provider about birth control (contraception). OSTEOPOROSIS AND MENOPAUSE   Osteoporosis is a disease in which the bones lose minerals and strength with aging. This can result in serious bone fractures. Your risk for osteoporosis can be identified using a bone density scan.  If you are 44 years of age or older, or if you are at risk for osteoporosis and fractures, ask your health care provider if you should be screened.  Ask your health care provider whether you should take a calcium or vitamin D supplement to lower your risk for osteoporosis.  Menopause may have certain physical symptoms and risks.  Hormone replacement therapy may reduce some of these symptoms and risks. Talk to your health care provider about whether hormone replacement therapy is right for you.  HOME CARE INSTRUCTIONS   Schedule regular health, dental, and eye exams.  Stay current with your immunizations.   Do not use any tobacco products including cigarettes, chewing tobacco, or electronic cigarettes.  If you are pregnant, do not drink alcohol.  If you are breastfeeding, limit how much and how often you drink alcohol.  Limit alcohol intake to no more than 1 drink per day for nonpregnant women. One drink equals 12 ounces of beer, 5 ounces of wine, or 1 ounces of hard liquor.  Do not use street drugs.  Do not share needles.  Ask your health care provider for help if you need support or information about quitting drugs.  Tell your health care provider if you often feel depressed.  Tell your health care provider if you have ever been abused or do not feel safe at home. Document  Released: 08/28/2010 Document Revised: 06/29/2013 Document Reviewed: 01/14/2013 [facility] Patient Information 2015 [place], [place]. This information is not intended to replace advice given to you by your health care provider. Make sure you discuss any questions you have with your health care provider.  Colonoscopy A colonoscopy is an exam to look at the entire large intestine (colon). This exam can help find problems such as tumors, polyps, inflammation, and areas of bleeding. The exam takes about 1 hour.  LET [facility] CARE PROVIDER KNOW ABOUT:  4. Any allergies you have. 5. All medicines you are taking, including vitamins, herbs, eye drops, creams, and over-the-counter medicines. 6. Previous problems you or members of your family have had with the use of anesthetics. 7. Any blood disorders you have. 8. Previous surgeries you have had. 9. Medical conditions you have. RISKS AND COMPLICATIONS  Generally, this is a safe procedure. However, as with any procedure, complications can occur. Possible complications include: 3. Bleeding. 4. Tearing or rupture of the colon wall. 5. Reaction to medicines given during the exam. 6. Infection (rare). BEFORE THE PROCEDURE  3. Ask your health care provider about changing or stopping your regular medicines. 4. You may be prescribed an oral bowel prep. This involves drinking a large amount of medicated liquid, starting the day before your procedure. The liquid will cause you to have multiple loose stools until your stool is almost clear or light green. This cleans out your colon in preparation for the procedure. 5. Do not eat or drink anything else once you have started the bowel prep, unless your health care provider tells you it is safe to do so. 6. Arrange for someone to drive you home after the procedure. PROCEDURE  5. You will be given medicine to help you relax (sedative). 6. You will lie on your side with your knees bent. 7. A  long, flexible tube  with a light and camera on the end (colonoscope) will be inserted through the rectum and into the colon. The camera sends video back to a computer screen as it moves through the colon. The colonoscope also releases carbon dioxide gas to inflate the colon. This helps your health care provider see the area better. 8. During the exam, your health care provider may take a small tissue sample (biopsy) to be examined under a microscope if any abnormalities are found. 9. The exam is finished when the entire colon has been viewed. AFTER THE PROCEDURE   Do not drive for 24 hours after the exam.  You may have a small amount of blood in your stool.  You may pass moderate amounts of gas and have mild abdominal cramping or bloating. This is caused by the gas used to inflate your colon during the exam.  Ask when your test results will be ready and how you will get your results. Make sure you get your test results. Document Released: 02/10/2000 Document Revised: 12/03/2012 Document Reviewed: 10/20/2012 [facility] Patient Information 2015 [place], [place]. This information is not intended to replace advice given to you by your health care provider. Make sure you discuss any questions you have with your health care provider.     Why follow it? Research shows. . Those who follow the Mediterranean diet have a reduced risk of heart disease  . The diet is associated with a reduced incidence of Parkinson's and Alzheimer's diseases . People following the diet may have longer life expectancies and lower rates of chronic diseases  . The Dietary Guidelines for Americans recommends the Mediterranean diet as an eating plan to promote health and prevent disease  What Is the Mediterranean Diet?  . Healthy eating plan based on typical foods and recipes of Mediterranean-style cooking . The diet is primarily a plant based diet; these foods should make up a majority of meals   Starches - Plant based foods should make up a  majority of meals - They are an important sources of vitamins, minerals, energy, antioxidants, and fiber - Choose whole grains, foods high in fiber and minimally processed items  - Typical grain sources include wheat, oats, barley, corn, brown rice, bulgar, farro, millet, polenta, couscous  - Various types of beans include chickpeas, lentils, fava beans, black beans, white beans   Fruits  Veggies - Large quantities of antioxidant rich fruits & veggies; 6 or more servings  - Vegetables can be eaten raw or lightly drizzled with oil and cooked  - Vegetables common to the traditional Mediterranean Diet include: artichokes, arugula, beets, broccoli, brussel sprouts, cabbage, carrots, celery, collard greens, cucumbers, eggplant, kale, leeks, lemons, lettuce, mushrooms, okra, onions, peas, peppers, potatoes, pumpkin, radishes, rutabaga, shallots, spinach, sweet potatoes, turnips, zucchini - Fruits common to the Mediterranean Diet include: apples, apricots, avocados, cherries, clementines, dates, figs, grapefruits, grapes, melons, nectarines, oranges, peaches, pears, pomegranates, strawberries, tangerines  Fats - Replace butter and margarine with healthy oils, such as olive oil, canola oil, and tahini  - Limit nuts to no more than a handful a day  - Nuts include walnuts, almonds, pecans, pistachios, pine nuts  - Limit or avoid candied, honey roasted or heavily salted nuts - Olives are central to the [employer] - can be eaten whole or used in a variety of dishes   Meats Protein - Limiting red meat: no more than a few times a month - When eating red meat: choose lean cuts and  keep the portion to the size of deck of cards - Eggs: approx. 0 to 4 times a week  - Fish and lean poultry: at least 2 a week  - Healthy protein sources include, chicken, [place], lean beef, lamb - Increase intake of seafood such as tuna, salmon, trout, mackerel, shrimp, scallops - Avoid or limit high fat processed meats such  as sausage and bacon  Dairy - Include moderate amounts of low fat dairy products  - Focus on healthy dairy such as fat free yogurt, skim milk, low or reduced fat cheese - Limit dairy products higher in fat such as whole or 2% milk, cheese, ice cream  Alcohol - Moderate amounts of red wine is ok  - No more than 5 oz daily for women (all ages) and men older than age 70  - No more than 10 oz of wine daily for men younger than 8  Other - Limit sweets and other desserts  - Use herbs and spices instead of salt to flavor foods  - Herbs and spices common to the traditional Mediterranean Diet include: basil, bay leaves, chives, cloves, cumin, fennel, garlic, lavender, marjoram, mint, oregano, parsley, pepper, rosemary, sage, savory, sumac, tarragon, thyme   It's not just a diet, it's a lifestyle:  . The Mediterranean diet includes lifestyle factors typical of those in the region  . Foods, drinks and meals are best eaten with others and savored . Daily physical activity is important for overall good health . This could be strenuous exercise like running and aerobics . This could also be more leisurely activities such as walking, housework, yard-work, or taking the stairs . Moderation is the key; a balanced and healthy diet accommodates most foods and drinks . Consider portion sizes and frequency of consumption of certain foods   Meal Ideas & Options:  . Breakfast:  o Whole wheat toast or whole wheat English muffins with peanut butter & hard boiled egg o Steel cut oats topped with apples & cinnamon and skim milk  o Fresh fruit: banana, strawberries, melon, berries, peaches  o Smoothies: strawberries, bananas, greek yogurt, peanut butter o Low fat greek yogurt with blueberries and granola  o Egg white omelet with spinach and mushrooms o Breakfast couscous: whole wheat couscous, apricots, skim milk, cranberries  . Sandwiches:  o Hummus and grilled vegetables (peppers, zucchini, squash) on whole  wheat bread   o Grilled chicken on whole wheat pita with lettuce, tomatoes, cucumbers or tzatziki  o Tuna salad on whole wheat bread: tuna salad made with greek yogurt, olives, red peppers, capers, green onions o Garlic rosemary lamb pita: lamb sauted with garlic, rosemary, salt & pepper; add lettuce, cucumber, greek yogurt to pita - flavor with lemon juice and black pepper  . Seafood:  o Mediterranean grilled salmon, seasoned with garlic, basil, parsley, lemon juice and black pepper o Shrimp, lemon, and spinach whole-grain pasta salad made with low fat greek yogurt  o Seared scallops with lemon orzo  o Seared tuna steaks seasoned salt, pepper, coriander topped with tomato mixture of olives, tomatoes, olive oil, minced garlic, parsley, green onions and cappers  . Meats:  o Herbed greek chicken salad with kalamata olives, cucumber, feta  o Red bell peppers stuffed with spinach, bulgur, lean ground beef (or lentils) & topped with feta   o Kebabs: skewers of chicken, tomatoes, onions, zucchini, squash  o [place] burgers: made with red onions, mint, dill, lemon juice, feta cheese topped with roasted red peppers . Vegetarian  o Cucumber salad: cucumbers, artichoke hearts, celery, red onion, feta cheese, tossed in olive oil & lemon juice  o Hummus and whole grain pita points with a greek salad (lettuce, tomato, feta, olives, cucumbers, red onion) o Lentil soup with celery, carrots made with vegetable broth, garlic, salt and pepper  o Tabouli salad: parsley, bulgur, mint, scallions, cucumbers, tomato, radishes, lemon juice, olive oil, salt and pepper.      Fat and Cholesterol Control Diet Fat and cholesterol levels in your blood and organs are influenced by your diet. High levels of fat and cholesterol may lead to diseases of the heart, small and large blood vessels, gallbladder, liver, and pancreas. CONTROLLING FAT AND CHOLESTEROL WITH DIET Although exercise and lifestyle factors are important, your  diet is key. That is because certain foods are known to raise cholesterol and others to lower it. The goal is to balance foods for their effect on cholesterol and more importantly, to replace saturated and trans fat with other types of fat, such as monounsaturated fat, polyunsaturated fat, and omega-3 fatty acids. On average, a person should consume no more than 15 to 17 g of saturated fat daily. Saturated and trans fats are considered "bad" fats, and they will raise LDL cholesterol. Saturated fats are primarily found in animal products such as meats, butter, and cream. However, that does not mean you need to give up all your favorite foods. Today, there are good tasting, low-fat, low-cholesterol substitutes for most of the things you like to eat. Choose low-fat or nonfat alternatives. Choose round or loin cuts of red meat. These types of cuts are lowest in fat and cholesterol. Chicken (without the skin), fish, veal, and ground [place] breast are great choices. Eliminate fatty meats, such as hot dogs and salami. Even shellfish have little or no saturated fat. Have a 3 oz (85 g) portion when you eat lean meat, poultry, or fish. Trans fats are also called "partially hydrogenated oils." They are oils that have been scientifically manipulated so that they are solid at room temperature resulting in a longer shelf life and improved taste and texture of foods in which they are added. Trans fats are found in stick margarine, some tub margarines, cookies, crackers, and baked goods.  When baking and cooking, oils are a great substitute for butter. The monounsaturated oils are especially beneficial since it is believed they lower LDL and raise HDL. The oils you should avoid entirely are saturated tropical oils, such as coconut and palm.  Remember to eat a lot from food groups that are naturally free of saturated and trans fat, including fish, fruit, vegetables, beans, grains (barley, rice, couscous, bulgur wheat), and pasta  (without cream sauces).  IDENTIFYING FOODS THAT LOWER FAT AND CHOLESTEROL  Soluble fiber may lower your cholesterol. This type of fiber is found in fruits such as apples, vegetables such as broccoli, potatoes, and carrots, legumes such as beans, peas, and lentils, and grains such as barley. Foods fortified with plant sterols (phytosterol) may also lower cholesterol. You should eat at least 2 g per day of these foods for a cholesterol lowering effect.  Read package labels to identify low-saturated fats, trans fat free, and low-fat foods at the supermarket. Select cheeses that have only 2 to 3 g saturated fat per ounce. Use a heart-healthy tub margarine that is free of trans fats or partially hydrogenated oil. When buying baked goods (cookies, crackers), avoid partially hydrogenated oils. Breads and muffins should be made from whole grains (whole-wheat or  whole oat flour, instead of "flour" or "enriched flour"). Buy non-creamy canned soups with reduced salt and no added fats.  FOOD PREPARATION TECHNIQUES  Never deep-fry. If you must fry, either stir-fry, which uses very little fat, or use non-stick cooking sprays. When possible, broil, bake, or roast meats, and steam vegetables. Instead of putting butter or margarine on vegetables, use lemon and herbs, applesauce, and cinnamon (for squash and sweet potatoes). Use nonfat yogurt, salsa, and low-fat dressings for salads.  LOW-SATURATED FAT / LOW-FAT FOOD SUBSTITUTES Meats / Saturated Fat (g) 10. Avoid: Steak, marbled (3 oz/85 g) / 11 g 11. Choose: Steak, lean (3 oz/85 g) / 4 g 7. Avoid: Hamburger (3 oz/85 g) / 7 g 8. Choose: Hamburger, lean (3 oz/85 g) / 5 g 7. Avoid: Ham (3 oz/85 g) / 6 g 8. Choose: Ham, lean cut (3 oz/85 g) / 2.4 g 10. Avoid: Chicken, with skin, dark meat (3 oz/85 g) / 4 g 11. Choose: Chicken, skin removed, dark meat (3 oz/85 g) / 2 g  Avoid: Chicken, with skin, light meat (3 oz/85 g) / 2.5 g  Choose: Chicken, skin removed, light  meat (3 oz/85 g) / 1 g Dairy / Saturated Fat (g)  Avoid: Whole milk (1 cup) / 5 g  Choose: Low-fat milk, 2% (1 cup) / 3 g  Choose: Low-fat milk, 1% (1 cup) / 1.5 g  Choose: Skim milk (1 cup) / 0.3 g  Avoid: Hard cheese (1 oz/28 g) / 6 g  Choose: Skim milk cheese (1 oz/28 g) / 2 to 3 g  Avoid: Cottage cheese, 4% fat (1 cup) / 6.5 g  Choose: Low-fat cottage cheese, 1% fat (1 cup) / 1.5 g  Avoid: Ice cream (1 cup) / 9 g  Choose: Sherbet (1 cup) / 2.5 g  Choose: Nonfat frozen yogurt (1 cup) / 0.3 g  Choose: Frozen fruit bar / trace  Avoid: Whipped cream (1 tbs) / 3.5 g  Choose: Nondairy whipped topping (1 tbs) / 1 g Condiments / Saturated Fat (g)  Avoid: Mayonnaise (1 tbs) / 2 g  Choose: Low-fat mayonnaise (1 tbs) / 1 g  Avoid: Butter (1 tbs) / 7 g  Choose: Extra light margarine (1 tbs) / 1 g  Avoid: Coconut oil (1 tbs) / 11.8 g  Choose: Olive oil (1 tbs) / 1.8 g  Choose: Corn oil (1 tbs) / 1.7 g  Choose: Safflower oil (1 tbs) / 1.2 g  Choose: Sunflower oil (1 tbs) / 1.4 g  Choose: Soybean oil (1 tbs) / 2.4 g  Choose: Canola oil (1 tbs) / 1 g Document Released: 02/12/2005 Document Revised: 06/09/2012 Document Reviewed: 05/13/2013 ExitCare Patient Information 2015 [place], [place]. This information is not intended to replace advice given to you by your health care provider. Make sure you discuss any questions you have with your health care provider.  American Heart Association (AHA) Exercise Recommendation  Being physically active is important to prevent heart disease and stroke, the nation's No. 1and No. 5killers. To improve overall cardiovascular health, we suggest at least 150 minutes per week of moderate exercise or 75 minutes per week of vigorous exercise (or a combination of moderate and vigorous activity). Thirty minutes a day, five times a week is an easy goal to remember. You will also experience benefits even if you divide your time into two or three  segments of 10 to 15 minutes per day.  For people who would benefit from lowering their blood  pressure or cholesterol, we recommend 40 minutes of aerobic exercise of moderate to vigorous intensity three to four times a week to lower the risk for heart attack and stroke.  Physical activity is anything that makes you move your body and burn calories.  This includes things like climbing stairs or playing sports. Aerobic exercises benefit your heart, and include walking, jogging, swimming or biking. Strength and stretching exercises are best for overall stamina and flexibility.  The simplest, positive change you can make to effectively improve your heart health is to start walking. It's enjoyable, free, easy, social and great exercise. A walking program is flexible and boasts high success rates because people can stick with it. It's easy for walking to become a regular and satisfying part of life.   For Overall Cardiovascular Health:  At least 30 minutes of moderate-intensity aerobic activity at least 5 days per week for a total of 150  OR   At least 25 minutes of vigorous aerobic activity at least 3 days per week for a total of 75 minutes; or a combination of moderate- and vigorous-intensity aerobic activity  AND   Moderate- to high-intensity muscle-strengthening activity at least 2 days per week for additional health benefits.  For Lowering Blood Pressure and Cholesterol  An average 40 minutes of moderate- to vigorous-intensity aerobic activity 3 or 4 times per week  What if I can't make it to the time goal? Something is always better than nothing! And everyone has to start somewhere. Even if you've been sedentary for years, today is the day you can begin to make healthy changes in your life. If you don't think you'll make it for 30 or 40 minutes, set a reachable goal for today. You can work up toward your overall goal by increasing your time as you get stronger. Don't let all-or-nothing  thinking rob you of doing what you can every day.  Source:http://www.heart.org

## 2014-09-14 ENCOUNTER — Other Ambulatory Visit: Payer: Self-pay

## 2014-09-14 ENCOUNTER — Telehealth: Payer: Self-pay | Admitting: Gastroenterology

## 2014-09-14 LAB — CBC WITH DIFFERENTIAL/PLATELET
Basophils Absolute: 0.1 10*3/uL (ref 0.0–0.2)
Basos: 1 %
EOS (ABSOLUTE): 0.5 10*3/uL — ABNORMAL HIGH (ref 0.0–0.4)
EOS: 7 %
HEMATOCRIT: 41.9 % (ref 34.0–46.6)
HEMOGLOBIN: 14.3 g/dL (ref 11.1–15.9)
Immature Grans (Abs): 0 10*3/uL (ref 0.0–0.1)
Immature Granulocytes: 0 %
LYMPHS ABS: 1.9 10*3/uL (ref 0.7–3.1)
Lymphs: 27 %
MCH: 29.4 pg (ref 26.6–33.0)
MCHC: 34.1 g/dL (ref 31.5–35.7)
MCV: 86 fL (ref 79–97)
MONOCYTES: 8 %
Monocytes Absolute: 0.5 10*3/uL (ref 0.1–0.9)
Neutrophils Absolute: 4 10*3/uL (ref 1.4–7.0)
Neutrophils: 57 %
Platelets: 242 10*3/uL (ref 150–379)
RBC: 4.86 x10E6/uL (ref 3.77–5.28)
RDW: 14 % (ref 12.3–15.4)
WBC: 7 10*3/uL (ref 3.4–10.8)

## 2014-09-14 LAB — LIPID PANEL
Chol/HDL Ratio: 4.3 ratio units (ref 0.0–4.4)
Cholesterol, Total: 187 mg/dL (ref 100–199)
HDL: 43 mg/dL (ref >39)
LDL Calculated: 98 mg/dL (ref 0–99)
TRIGLYCERIDES: 230 mg/dL — AB (ref 0–149)
VLDL Cholesterol Cal: 46 mg/dL — ABNORMAL HIGH (ref 5–40)

## 2014-09-14 LAB — COMPREHENSIVE METABOLIC PANEL
ALT: 44 IU/L — ABNORMAL HIGH (ref 0–32)
AST: 29 IU/L (ref 0–40)
Albumin/Globulin Ratio: 1.8 (ref 1.1–2.5)
Albumin: 4.6 g/dL (ref 3.5–5.5)
Alkaline Phosphatase: 73 IU/L (ref 39–117)
BUN/Creatinine Ratio: 14 (ref 9–23)
BUN: 16 mg/dL (ref 6–24)
Bilirubin Total: 0.6 mg/dL (ref 0.0–1.2)
CO2: 24 mmol/L (ref 18–29)
Calcium: 9.7 mg/dL (ref 8.7–10.2)
Chloride: 102 mmol/L (ref 97–108)
Creatinine, Ser: 1.12 mg/dL — ABNORMAL HIGH (ref 0.57–1.00)
GFR calc Af Amer: 63 mL/min/{1.73_m2} (ref >59)
GFR calc non Af Amer: 55 mL/min/{1.73_m2} — ABNORMAL LOW (ref >59)
Globulin, Total: 2.5 g/dL (ref 1.5–4.5)
Glucose: 133 mg/dL — ABNORMAL HIGH (ref 65–99)
Potassium: 5.1 mmol/L (ref 3.5–5.2)
Sodium: 142 mmol/L (ref 134–144)
Total Protein: 7.1 g/dL (ref 6.0–8.5)

## 2014-09-14 LAB — HEMOGLOBIN A1C
Est. average glucose Bld gHb Est-mCnc: 123 mg/dL
HEMOGLOBIN A1C: 5.9 % — AB (ref 4.8–5.6)

## 2014-09-14 LAB — TSH: TSH: 1.73 u[IU]/mL (ref 0.450–4.500)

## 2014-09-14 NOTE — Telephone Encounter (Signed)
Gastroenterology Pre-Procedure Review  Request Date: 10-08-14 Requesting Physician: Dr. Doristine CounterBurnett  PATIENT REVIEW QUESTIONS: The patient responded to the following health history questions as indicated:    1. Are you having any GI issues? no 2. Do you have a personal history of Polyps? no 3. Do you have a family history of Colon Cancer or Polyps? yes (Maternal grandfather) 4. Diabetes Mellitus? no 5. Joint replacements in the past 12 months?no 6. Major health problems in the past 3 months?no 7. Any artificial heart valves, MVP, or defibrillator?no    MEDICATIONS & ALLERGIES:    Patient reports the following regarding taking any anticoagulation/antiplatelet therapy:   Plavix, Coumadin, Eliquis, Xarelto, Lovenox, Pradaxa, Brilinta, or Effient? no Aspirin? yes (81mg )  Patient confirms/reports the following medications:  Current Outpatient Prescriptions  Medication Sig Dispense Refill  . albuterol (PROVENTIL HFA;VENTOLIN HFA) 108 (90 BASE) MCG/ACT inhaler Inhale 2 puffs into the lungs every 4 (four) hours as needed for wheezing or shortness of breath. Every 4-6 hours as needed 1 Inhaler 6  . ALPRAZolam (XANAX) 0.5 MG tablet Take 1 tablet by mouth every 8 (eight) hours as needed.    Marland Kitchen. aspirin 81 MG tablet Take by mouth daily.    Marland Kitchen. buPROPion (WELLBUTRIN XL) 150 MG 24 hr tablet Take 1 tablet by mouth daily.    . Calcium-Magnesium-Vitamin D (CALCIUM 500 PO) Take 1 tablet by mouth 2 (two) times daily.    . cholecalciferol (VITAMIN D) 1000 UNITS tablet Take 1 tablet by mouth 2 (two) times daily.    . ciprofloxacin (CIPRO) 500 MG tablet Take 1 tablet (500 mg total) by mouth 2 (two) times daily. (Patient not taking: Reported on 09/10/2014) 20 tablet 0  . clotrimazole-betamethasone (LOTRISONE) cream 1 application 2 (two) times daily.    . fluticasone (FLONASE) 50 MCG/ACT nasal spray Place 2 sprays into the nose daily.    . Fluticasone-Salmeterol (ADVAIR) 250-50 MCG/DOSE AEPB Inhale 1 puff into the  lungs 2 (two) times daily.    Marland Kitchen. HYDROcodone-homatropine (HYCODAN) 5-1.5 MG/5ML syrup Take 5 mLs by mouth every 8 (eight) hours as needed for cough. (Patient not taking: Reported on 09/10/2014) 120 mL 0  . loratadine (CLARITIN) 10 MG tablet Take 1 tablet by mouth daily.    . montelukast (SINGULAIR) 10 MG tablet Take 1 tablet by mouth daily.    . MULTIPLE VITAMIN PO Take 1 tablet by mouth daily.    . OMEGA-3 FATTY ACIDS PO Take 1 tablet by mouth daily.    . sertraline (ZOLOFT) 100 MG tablet Take 1.5 tablets (150 mg total) by mouth daily. 30 tablet 5   No current facility-administered medications for this visit.    Patient confirms/reports the following allergies:  Allergies  Allergen Reactions  . Penicillins     No orders of the defined types were placed in this encounter.    AUTHORIZATION INFORMATION Primary Insurance: 1D#: Group #:  Secondary Insurance: 1D#: Group #:  SCHEDULE INFORMATION: Date: 10-08-14 Time: Location: [facility]

## 2014-09-14 NOTE — Telephone Encounter (Signed)
Pt returning you call for colonoscopy triage Work [phone] Cell [phone]

## 2014-09-17 ENCOUNTER — Telehealth: Payer: Self-pay

## 2014-09-17 NOTE — Telephone Encounter (Signed)
LMTCB  aa 

## 2014-09-17 NOTE — Telephone Encounter (Signed)
Pt called you back around 4:30.  Joanna Bishop Joanna Bishop

## 2014-09-17 NOTE — Telephone Encounter (Signed)
-----   Message from Margaretann Loveless, PA-C sent at 09/14/2014  8:28 AM EDT ----- HgBA1c was elevtaed at 5.9 indicating pre-diabetic state.  Triglycerides were elevated at 230 (like below 150).  Total cholesterol was ok at 187.  Work on diet with trying to avoid fatty foods and foods with high cholesterol.  Try to add exercise for 30-40 minutes 3-4 days per week.  All other labs were WNL.  Will recheck labs in 6 months.  Thanks! -JB

## 2014-09-20 NOTE — Telephone Encounter (Signed)
Pt advised-aa 

## 2014-09-30 ENCOUNTER — Telehealth: Payer: Self-pay | Admitting: Gastroenterology

## 2014-09-30 NOTE — Telephone Encounter (Signed)
LVM for pt to return my call.

## 2014-09-30 NOTE — Telephone Encounter (Signed)
Patient needs to change her colonoscopy appt. 08/12

## 2014-10-07 NOTE — Telephone Encounter (Signed)
Returned pts call regarding rescheduling procedure. Pt stated her husband had an appt with his cardiologist and wanted to call me back when his appts were done. Contacted [facility] and cancelled procedure that was scheduled for 10/08/14.

## 2014-10-08 ENCOUNTER — Ambulatory Visit: Payer: BLUE CROSS/BLUE SHIELD | Admitting: Physician Assistant

## 2014-10-08 ENCOUNTER — Encounter: Admission: RE | Payer: Self-pay | Source: Ambulatory Visit

## 2014-10-08 ENCOUNTER — Ambulatory Visit
Admission: RE | Admit: 2014-10-08 | Payer: BLUE CROSS/BLUE SHIELD | Source: Ambulatory Visit | Admitting: Gastroenterology

## 2014-10-08 SURGERY — COLONOSCOPY WITH PROPOFOL
Anesthesia: Choice

## 2014-10-13 ENCOUNTER — Ambulatory Visit (INDEPENDENT_AMBULATORY_CARE_PROVIDER_SITE_OTHER): Payer: BLUE CROSS/BLUE SHIELD | Admitting: Physician Assistant

## 2014-10-13 ENCOUNTER — Encounter: Payer: Self-pay | Admitting: Physician Assistant

## 2014-10-13 VITALS — BP 148/84 | HR 99 | Temp 98.0°F | Resp 18 | Wt 242.4 lb

## 2014-10-13 DIAGNOSIS — J4521 Mild intermittent asthma with (acute) exacerbation: Secondary | ICD-10-CM

## 2014-10-13 MED ORDER — HYDROCODONE-HOMATROPINE 5-1.5 MG/5ML PO SYRP: 5.0000 mL | ORAL_SOLUTION | Freq: Three times a day (TID) | ORAL | Status: DC | PRN

## 2014-10-13 MED ORDER — PREDNISONE 10 MG PO TABS: ORAL_TABLET | ORAL | Status: DC

## 2014-10-13 MED ORDER — CLINDAMYCIN HCL 300 MG PO CAPS: 300.0000 mg | ORAL_CAPSULE | Freq: Three times a day (TID) | ORAL | Status: DC

## 2014-10-13 NOTE — Patient Instructions (Signed)
Cough, Adult  A cough is a reflex that helps clear your throat and airways. It can help heal the body or may be a reaction to an irritated airway. A cough may only last 2 or 3 weeks (acute) or may last more than 8 weeks (chronic).  CAUSES Acute cough:  Viral or bacterial infections. Chronic cough:  Infections.  Allergies.  Asthma.  Post-nasal drip.  Smoking.  Heartburn or acid reflux.  Some medicines.  Chronic lung problems (COPD).  Cancer. SYMPTOMS   Cough.  Fever.  Chest pain.  Increased breathing rate.  High-pitched whistling sound when breathing (wheezing).  Colored mucus that you cough up (sputum). TREATMENT   A bacterial cough may be treated with antibiotic medicine.  A viral cough must run its course and will not respond to antibiotics.  Your caregiver may recommend other treatments if you have a chronic cough. HOME CARE INSTRUCTIONS   Only take over-the-counter or prescription medicines for pain, discomfort, or fever as directed by your caregiver. Use cough suppressants only as directed by your caregiver.  Use a cold steam vaporizer or humidifier in your bedroom or home to help loosen secretions.  Sleep in a semi-upright position if your cough is worse at night.  Rest as needed.  Stop smoking if you smoke. SEEK IMMEDIATE MEDICAL CARE IF:   You have pus in your sputum.  Your cough starts to worsen.  You cannot control your cough with suppressants and are losing sleep.  You begin coughing up blood.  You have difficulty breathing.  You develop pain which is getting worse or is uncontrolled with medicine.  You have a fever. MAKE SURE YOU:   Understand these instructions.  Will watch your condition.  Will get help right away if you are not doing well or get worse. Document Released: 08/11/2010 Document Revised: 05/07/2011 Document Reviewed: 08/11/2010 Exit[employer] Patient Information 2015 [employer]. This information is not intended  to replace advice given to you by your health care provider. Make sure you discuss any questions you have with your health care provider. Asthma Asthma is a recurring condition in which the airways tighten and narrow. Asthma can make it difficult to breathe. It can cause coughing, wheezing, and shortness of breath. Asthma episodes, also called asthma attacks, range from minor to life-threatening. Asthma cannot be cured, but medicines and lifestyle changes can help control it. CAUSES Asthma is believed to be caused by inherited (genetic) and environmental factors, but its exact cause is unknown. Asthma may be triggered by allergens, lung infections, or irritants in the air. Asthma triggers are different for each person. Common triggers include:   Animal dander.  Dust mites.  Cockroaches.  Pollen from trees or grass.  Mold.  Smoke.  Air pollutants such as dust, household cleaners, hair sprays, aerosol sprays, paint fumes, strong chemicals, or strong odors.  Cold air, weather changes, and winds (which increase molds and pollens in the air).  Strong emotional expressions such as crying or laughing hard.  Stress.  Certain medicines (such as aspirin) or types of drugs (such as beta-blockers).  Sulfites in foods and drinks. Foods and drinks that may contain sulfites include dried fruit, potato chips, and sparkling grape juice.  Infections or inflammatory conditions such as the flu, a cold, or an inflammation of the nasal membranes (rhinitis).  Gastroesophageal reflux disease (GERD).  Exercise or strenuous activity. SYMPTOMS Symptoms may occur immediately after asthma is triggered or many hours later. Symptoms include:  Wheezing.  Excessive nighttime or   early morning coughing.  Frequent or severe coughing with a common cold.  Chest tightness.  Shortness of breath. DIAGNOSIS  The diagnosis of asthma is made by a review of your medical history and a physical exam. Tests may also  be performed. These may include:  Lung function studies. These tests show how much air you breathe in and out.  Allergy tests.  Imaging tests such as X-rays. TREATMENT  Asthma cannot be cured, but it can usually be controlled. Treatment involves identifying and avoiding your asthma triggers. It also involves medicines. There are 2 classes of medicine used for asthma treatment:   Controller medicines. These prevent asthma symptoms from occurring. They are usually taken every day.  Reliever or rescue medicines. These quickly relieve asthma symptoms. They are used as needed and provide short-term relief. Your health care provider will help you create an asthma action plan. An asthma action plan is a written plan for managing and treating your asthma attacks. It includes a list of your asthma triggers and how they may be avoided. It also includes information on when medicines should be taken and when their dosage should be changed. An action plan may also involve the use of a device called a peak flow meter. A peak flow meter measures how well the lungs are working. It helps you monitor your condition. HOME CARE INSTRUCTIONS   Take medicines only as directed by your health care provider. Speak with your health care provider if you have questions about how or when to take the medicines.  Use a peak flow meter as directed by your health care provider. Record and keep track of readings.  Understand and use the action plan to help minimize or stop an asthma attack without needing to seek medical care.  Control your home environment in the following ways to help prevent asthma attacks:  Do not smoke. Avoid being exposed to secondhand smoke.  Change your heating and air conditioning filter regularly.  Limit your use of fireplaces and wood stoves.  Get rid of pests (such as roaches and mice) and their droppings.  Throw away plants if you see mold on them.  Clean your floors and dust regularly.  Use unscented cleaning products.  Try to have someone else vacuum for you regularly. Stay out of rooms while they are being vacuumed and for a short while afterward. If you vacuum, use a dust mask from a hardware store, a double-layered or microfilter vacuum cleaner bag, or a vacuum cleaner with a HEPA filter.  Replace carpet with wood, tile, or vinyl flooring. Carpet can trap dander and dust.  Use allergy-proof pillows, mattress covers, and box spring covers.  Wash bed sheets and blankets every week in hot water and dry them in a dryer.  Use blankets that are made of polyester or cotton.  Clean bathrooms and kitchens with bleach. If possible, have someone repaint the walls in these rooms with mold-resistant paint. Keep out of the rooms that are being cleaned and painted.  Wash hands frequently. SEEK MEDICAL CARE IF:   You have wheezing, shortness of breath, or a cough even if taking medicine to prevent attacks.  The colored mucus you cough up (sputum) is thicker than usual.  Your sputum changes from clear or white to yellow, green, gray, or bloody.  You have any problems that may be related to the medicines you are taking (such as a rash, itching, swelling, or trouble breathing).  You are using a reliever medicine more than   2-3 times per week.  Your peak flow is still at 50-79% of your personal best after following your action plan for 1 hour.  You have a fever. SEEK IMMEDIATE MEDICAL CARE IF:   You seem to be getting worse and are unresponsive to treatment during an asthma attack.  You are short of breath even at rest.  You get short of breath when doing very little physical activity.  You have difficulty eating, drinking, or talking due to asthma symptoms.  You develop chest pain.  You develop a fast heartbeat.  You have a bluish color to your lips or fingernails.  You are light-headed, dizzy, or faint.  Your peak flow is less than 50% of your personal best. MAKE  SURE YOU:   Understand these instructions.  Will watch your condition.  Will get help right away if you are not doing well or get worse. Document Released: 02/12/2005 Document Revised: 06/29/2013 Document Reviewed: 09/11/2012 Exit[employer] Patient Information 2015 [employer]. This information is not intended to replace advice given to you by your health care provider. Make sure you discuss any questions you have with your health care provider.  

## 2014-10-13 NOTE — Progress Notes (Signed)
Subjective:    Patient ID: Joanna Bishop, female    DOB: Apr 19, 1955, 59 y.o.   MRN: [id number]  Cough This is a chronic problem. The current episode started more than 1 month ago. The problem has been gradually worsening. The problem occurs every few minutes. The cough is non-productive. Associated symptoms include headaches, postnasal drip, shortness of breath and wheezing. Pertinent negatives include no chest pain, chills, ear congestion, ear pain, fever, heartburn, hemoptysis, myalgias, nasal congestion, rash, rhinorrhea, sore throat, sweats or weight loss. The symptoms are aggravated by lying down. She has tried rest, steroid inhaler and a beta-agonist inhaler for the symptoms. The treatment provided mild relief. Her past medical history is significant for asthma and bronchitis. There is no history of bronchiectasis, COPD, emphysema, environmental allergies or pneumonia.      Review of Systems  Constitutional: Negative for fever, chills and weight loss.  HENT: Positive for postnasal drip. Negative for ear pain, rhinorrhea and sore throat.   Respiratory: Positive for cough, shortness of breath and wheezing. Negative for hemoptysis.   Cardiovascular: Negative for chest pain.  Gastrointestinal: Negative for heartburn.  Musculoskeletal: Negative for myalgias.  Skin: Negative for rash.  Allergic/Immunologic: Negative for environmental allergies.  Neurological: Positive for headaches.       Objective:   Physical Exam  Constitutional: She appears well-developed and well-nourished. No distress.  HENT:  Head: Normocephalic and atraumatic.  Right Ear: Hearing, tympanic membrane, external ear and ear canal normal.  Left Ear: Hearing, tympanic membrane, external ear and ear canal normal.  Nose: Nose normal.  Mouth/Throat: Uvula is midline, oropharynx is clear and moist and mucous membranes are normal. No oropharyngeal exudate.  Eyes: Conjunctivae and EOM are normal. Pupils are equal, round,  and reactive to light. Right eye exhibits no discharge. Left eye exhibits no discharge.  Neck: Normal range of motion. Neck supple. No JVD present. No tracheal deviation present. No Brudzinski's sign and no Kernig's sign noted. No thyromegaly present.  Cardiovascular: Normal rate, regular rhythm and normal heart sounds.  Exam reveals no gallop and no friction rub.   No murmur heard. Pulmonary/Chest: Effort normal. No stridor. No respiratory distress. She has decreased breath sounds (throughout). She has wheezes in the right lower field and the left lower field. She has no rales. She exhibits no tenderness.  Lymphadenopathy:    She has no cervical adenopathy.  Skin: Skin is warm and dry. She is not diaphoretic.  Vitals reviewed.         Assessment & Plan:  1. AB (asthmatic bronchitis), mild intermittent, with acute exacerbation Will treat with prednisone and clindamycin as below.  Hycodan cough syrup for nighttime cough prn.  She does not need to use her albuterol inhaler while taking the prednisone unless absolutely needed for rescue breathing.  She is to call the office if Sx do not improve or worsen.  May get CXR if no improvement.  Once stable may need to check spirometry again as it has been a while.  She may have asthma progression and need more control with her medications.  She does continue advair and singulair daily. - predniSONE (DELTASONE) 10 MG tablet; 6 tabs PO day 1 & 2, 5 tabs PO on day 3 & 4, 4 tabs PO on day 5 & 6, 3 tabs PO on day 7 & 8, 2 tabs PO on day 9 & 10, 1 tab PO onday 11 &12.  Dispense: 42 tablet; Refill: 0 - clindamycin (CLEOCIN) 300 MG capsule;  Take 1 capsule (300 mg total) by mouth 3 (three) times daily.  Dispense: 30 capsule; Refill: 0 - HYDROcodone-homatropine (HYCODAN) 5-1.5 MG/5ML syrup; Take 5 mLs by mouth every 8 (eight) hours as needed for cough.  Dispense: 120 mL; Refill: 0

## 2014-11-23 ENCOUNTER — Ambulatory Visit: Payer: BLUE CROSS/BLUE SHIELD | Admitting: Family Medicine

## 2014-12-09 ENCOUNTER — Encounter: Payer: Self-pay | Admitting: Physician Assistant

## 2014-12-09 ENCOUNTER — Ambulatory Visit (INDEPENDENT_AMBULATORY_CARE_PROVIDER_SITE_OTHER): Payer: BLUE CROSS/BLUE SHIELD | Admitting: Physician Assistant

## 2014-12-09 VITALS — BP 110/70 | HR 98 | Temp 98.3°F | Resp 16 | Ht 65.0 in | Wt 243.8 lb

## 2014-12-09 DIAGNOSIS — F419 Anxiety disorder, unspecified: Secondary | ICD-10-CM

## 2014-12-09 DIAGNOSIS — R05 Cough: Secondary | ICD-10-CM | POA: Diagnosis not present

## 2014-12-09 DIAGNOSIS — F3175 Bipolar disorder, in partial remission, most recent episode depressed: Secondary | ICD-10-CM | POA: Diagnosis not present

## 2014-12-09 DIAGNOSIS — Z1389 Encounter for screening for other disorder: Secondary | ICD-10-CM | POA: Diagnosis not present

## 2014-12-09 DIAGNOSIS — R059 Cough, unspecified: Secondary | ICD-10-CM

## 2014-12-09 DIAGNOSIS — J4521 Mild intermittent asthma with (acute) exacerbation: Secondary | ICD-10-CM | POA: Diagnosis not present

## 2014-12-09 MED ORDER — ALBUTEROL SULFATE HFA 108 (90 BASE) MCG/ACT IN AERS: 2.0000 | INHALATION_SPRAY | RESPIRATORY_TRACT | Status: DC | PRN

## 2014-12-09 MED ORDER — HYDROCODONE-HOMATROPINE 5-1.5 MG/5ML PO SYRP: 5.0000 mL | ORAL_SOLUTION | Freq: Three times a day (TID) | ORAL | Status: DC | PRN

## 2014-12-09 MED ORDER — ALPRAZOLAM 0.5 MG PO TABS: 0.5000 mg | ORAL_TABLET | Freq: Three times a day (TID) | ORAL | Status: DC | PRN

## 2014-12-09 MED ORDER — BUPROPION HCL ER (XL) 300 MG PO TB24: 300.0000 mg | ORAL_TABLET | Freq: Every day | ORAL | Status: DC

## 2014-12-09 NOTE — Patient Instructions (Signed)
Major Depressive Disorder Major depressive disorder is a mental illness. It also may be called clinical depression or unipolar depression. Major depressive disorder usually causes feelings of sadness, hopelessness, or helplessness. Some people with this disorder do not feel particularly sad but lose interest in doing things they used to enjoy (anhedonia). Major depressive disorder also can cause physical symptoms. It can interfere with work, school, relationships, and other normal everyday activities. The disorder varies in severity but is longer lasting and more serious than the sadness we all feel from time to time in our lives. Major depressive disorder often is triggered by stressful life events or major life changes. Examples of these triggers include divorce, loss of your job or home, a move, and the death of a family member or close friend. Sometimes this disorder occurs for no obvious reason at all. People who have family members with major depressive disorder or bipolar disorder are at higher risk for developing this disorder, with or without life stressors. Major depressive disorder can occur at any age. It may occur just once in your life (single episode major depressive disorder). It may occur multiple times (recurrent major depressive disorder). SYMPTOMS People with major depressive disorder have either anhedonia or depressed mood on nearly a daily basis for at least 2 weeks or longer. Symptoms of depressed mood include:  Feelings of sadness (blue or down in the dumps) or emptiness.  Feelings of hopelessness or helplessness.  Tearfulness or episodes of crying (may be observed by others).  Irritability (children and adolescents). In addition to depressed mood or anhedonia or both, people with this disorder have at least four of the following symptoms:  Difficulty sleeping or sleeping too much.   Significant change (increase or decrease) in appetite or weight.   Lack of energy or  motivation.  Feelings of guilt and worthlessness.   Difficulty concentrating, remembering, or making decisions.  Unusually slow movement (psychomotor retardation) or restlessness (as observed by others).   Recurrent wishes for death, recurrent thoughts of self-harm (suicide), or a suicide attempt. People with major depressive disorder commonly have persistent negative thoughts about themselves, other people, and the world. People with severe major depressive disorder may experiencedistorted beliefs or perceptions about the world (psychotic delusions). They also may see or hear things that are not real (psychotic hallucinations). DIAGNOSIS Major depressive disorder is diagnosed through an assessment by your health care provider. Your health care provider will ask aboutaspects of your daily life, such as mood,sleep, and appetite, to see if you have the diagnostic symptoms of major depressive disorder. Your health care provider may ask about your medical history and use of alcohol or drugs, including prescription medicines. Your health care provider also may do a physical exam and blood work. This is because certain medical conditions and the use of certain substances can cause major depressive disorder-like symptoms (secondary depression). Your health care provider also may refer you to a mental health specialist for further evaluation and treatment. TREATMENT It is important to recognize the symptoms of major depressive disorder and seek treatment. The following treatments can be prescribed for this disorder:   Medicine. Antidepressant medicines usually are prescribed. Antidepressant medicines are thought to correct chemical imbalances in the brain that are commonly associated with major depressive disorder. Other types of medicine may be added if the symptoms do not respond to antidepressant medicines alone or if psychotic delusions or hallucinations occur.  Talk therapy. Talk therapy can be  helpful in treating major depressive disorder by providing   support, education, and guidance. Certain types of talk therapy also can help with negative thinking (cognitive behavioral therapy) and with relationship issues that trigger this disorder (interpersonal therapy). A mental health specialist can help determine which treatment is best for you. Most people with major depressive disorder do well with a combination of medicine and talk therapy. Treatments involving electrical stimulation of the brain can be used in situations with extremely severe symptoms or when medicine and talk therapy do not work over time. These treatments include electroconvulsive therapy, transcranial magnetic stimulation, and vagal nerve stimulation.   This information is not intended to replace advice given to you by your health care provider. Make sure you discuss any questions you have with your health care provider.   Document Released: 06/09/2012 Document Revised: 03/05/2014 Document Reviewed: 06/09/2012 Elsevier Interactive Patient Education 2016 [employer] Inc.  

## 2014-12-10 NOTE — Progress Notes (Signed)
Subjective:     Patient ID: Joanna Bishop, female   DOB: 07/19/1955, 59 y.o.   MRN: [id number]  HPI  CC: Cough Patient returns to clinic today with concerns of cough, last office visit was 10/10/14 she was placed on Hydrocodone-Homatropine. Patient has finished Rx but states cough is still present  Panic Attack Patient would like to address today symptoms of anxiety and depression. Patient states that she was diagnosed with bipolar disorder years ago and has maintained on medication Wellbutrin 150mg  and Xanax PRN. Patient reports have a panic attack daily for the past 4 days.        Joanna Bishop is a 59 year old female that comes to the office today with persistent cough. This did improve some from her last visit but with the season changes and has started to become bothersome again. She states she has not been sleeping well. She feels that because of the lack of sleep it has caused her depression symptoms to worsen. She does have a history of bipolar affective disorder with depression. She has been currently controlled on Zoloft and Wellbutrin. She states that over the last 4 days she has noticed the symptoms worsening. She has had more frequent panic attacks. She states she has had approximately 3-4 panic attacks in the last couple days. Prior to this she had not had a panic attack in about 13 years. She does have Xanax as needed for her panic attacks. This does help. She has had symptoms of decreased interest in activities, not wanting to get up out of bed, and has even called out of work due to the fear of just being in public. This is unlike her and she states that she feels that her depression symptoms are uncontrolled at this time. She does not have any suicidal or homicidal ideations at this time.  Review of Systems  Constitutional: Positive for activity change, appetite change and fatigue. Negative for fever, chills and diaphoresis.  HENT: Positive for congestion, rhinorrhea and sinus  pressure. Negative for ear discharge, ear pain, sneezing, sore throat, tinnitus, trouble swallowing and voice change.   Eyes: Negative for discharge, itching and visual disturbance.  Respiratory: Positive for cough, chest tightness and shortness of breath. Negative for wheezing.   Cardiovascular: Positive for chest pain (with panic attacks) and palpitations (with panic attacks).  Gastrointestinal: Negative for nausea, vomiting, diarrhea and constipation.  Endocrine: Negative.   Musculoskeletal: Negative.   Skin: Negative.   Neurological: Negative for dizziness, seizures, syncope, weakness, light-headedness and headaches.  Psychiatric/Behavioral: Positive for dysphoric mood and decreased concentration. Negative for suicidal ideas, sleep disturbance and self-injury. The patient is nervous/anxious.        Objective:   Physical Exam  Constitutional: She is oriented to person, place, and time. She appears well-developed and well-nourished. No distress.  HENT:  Head: Normocephalic and atraumatic.  Right Ear: Hearing, tympanic membrane, external ear and ear canal normal.  Left Ear: Hearing, tympanic membrane, external ear and ear canal normal.  Nose: Nose normal. Right sinus exhibits no maxillary sinus tenderness and no frontal sinus tenderness. Left sinus exhibits no maxillary sinus tenderness and no frontal sinus tenderness.  Mouth/Throat: Uvula is midline, oropharynx is clear and moist and mucous membranes are normal. No oropharyngeal exudate.  Eyes: Conjunctivae and EOM are normal. Pupils are equal, round, and reactive to light. Right eye exhibits no discharge. Left eye exhibits no discharge.  Neck: Normal range of motion. Neck supple. No JVD present. No tracheal deviation present. No Brudzinski's  sign and no Kernig's sign noted. No thyromegaly present.  Cardiovascular: Normal rate, regular rhythm and normal heart sounds.  Exam reveals no gallop and no friction rub.   No murmur  heard. Pulmonary/Chest: Effort normal and breath sounds normal. No stridor. No respiratory distress. She has no wheezes. She has no rales. She exhibits no tenderness.  Lymphadenopathy:    She has no cervical adenopathy.  Neurological: She is alert and oriented to person, place, and time.  Skin: Skin is warm and dry. She is not diaphoretic.  Psychiatric: Her speech is normal and behavior is normal. Judgment and thought content normal. Her mood appears not anxious. Cognition and memory are normal. She exhibits a depressed mood.  Vitals reviewed.      Assessment:     1. AB (asthmatic bronchitis), mild intermittent, with acute exacerbation   2. Anxiety   3. Bipolar disorder, in partial remission, most recent episode depressed (HCC)   4. Cough        Plan:     1. AB (asthmatic bronchitis), mild intermittent, with acute exacerbation I do feel the cough is an acute exacerbation of her asthmatic bronchitis. Currently her lungs sound good and she is not having any wheezing as she has previously. There were no adventitious lung sounds. I will refill her albuterol as below as well as Hycodan cough syrup for nighttime cough. She is to call the office if symptoms fail to improve or worsen. She is one that will quickly transition into severe bronchitis. I feel more that the reason of her visit was her depression and anxiety other than her cough. She did not complain of the cough too much while in the room nor did she have a severe or frequent cough during my time with her. I will hold off on antibiotic treatment at this time. If she does not improve we will then consider antibiotic therapy. - albuterol (PROVENTIL HFA;VENTOLIN HFA) 108 (90 BASE) MCG/ACT inhaler; Inhale 2 puffs into the lungs every 4 (four) hours as needed for wheezing or shortness of breath. Every 4-6 hours as needed  Dispense: 1 Inhaler; Refill: 11 - HYDROcodone-homatropine (HYCODAN) 5-1.5 MG/5ML syrup; Take 5 mLs by mouth every 8 (eight)  hours as needed for cough.  Dispense: 120 mL; Refill: 0  2. Anxiety Worsening. She states that from her Xanax prescription from January she does still have 6 pills remaining. I told her I would go ahead and refill her Xanax just in case she needs it for the future since she has had a worsening of her symptoms recently. I will see her back in 4 weeks to evaluate and see how she is doing. - ALPRAZolam (XANAX) 0.5 MG tablet; Take 1 tablet (0.5 mg total) by mouth every 8 (eight) hours as needed.  Dispense: 30 tablet; Refill: 1  3. Bipolar disorder, in partial remission, most recent episode depressed (HCC) Worsening. I will increase her Wellbutrin to 300 mg daily. She will continue to take the Wellbutrin in the morning and the Zoloft at night. I will follow-up with her in 4 weeks to see how she is doing with the increase in medication. I did advise her if she has any adverse effects or if she starts developing suicidal or homicidal ideations she is to please discontinue the medication and call the office immediately. - buPROPion (WELLBUTRIN XL) 300 MG 24 hr tablet; Take 1 tablet (300 mg total) by mouth daily.  Dispense: 30 tablet; Refill: 1  4. Cough Seemingly stable at this  time. I will refill her Hycodan cough syrup as below to hopefully help with nighttime cough. She is to call the office if symptoms fail to improve or worsen. - HYDROcodone-homatropine (HYCODAN) 5-1.5 MG/5ML syrup; Take 5 mLs by mouth every 8 (eight) hours as needed for cough.  Dispense: 120 mL; Refill: 0

## 2014-12-14 ENCOUNTER — Telehealth: Payer: Self-pay | Admitting: Physician Assistant

## 2014-12-14 DIAGNOSIS — J4521 Mild intermittent asthma with (acute) exacerbation: Secondary | ICD-10-CM

## 2014-12-14 MED ORDER — MOXIFLOXACIN HCL 400 MG PO TABS: 400.0000 mg | ORAL_TABLET | Freq: Every day | ORAL | Status: DC

## 2014-12-14 NOTE — Telephone Encounter (Signed)
Please review. Thanks!  

## 2014-12-14 NOTE — Telephone Encounter (Signed)
Pt called saying her cough was not much better.  Also she wants you to know she has an appt with a therapist on Thursday and that her anxiety and depression are not much better either.  Her call back is   Thanks , Fortune Brandsteri

## 2014-12-14 NOTE — Telephone Encounter (Signed)
Sent in Rx for [facility] to [facility].  Have her call if Sx do not improve with antibiotic.  Thanks.

## 2014-12-15 NOTE — Telephone Encounter (Signed)
Patient advised as directed below.  Thanks,  -Sequoya Hogsett 

## 2014-12-15 NOTE — Telephone Encounter (Signed)
LMTCB  Thanks,  -Avryl Roehm 

## 2014-12-17 ENCOUNTER — Telehealth: Payer: Self-pay | Admitting: Physician Assistant

## 2014-12-17 NOTE — Telephone Encounter (Signed)
Forms have been filled out and are available at front desk for pick up at your convenience.  Thanks.

## 2014-12-17 NOTE — Telephone Encounter (Signed)
Patient advised as directed below.  Thanks,  -Jamirah Zelaya 

## 2015-01-05 ENCOUNTER — Telehealth: Payer: Self-pay | Admitting: Physician Assistant

## 2015-01-05 NOTE — Telephone Encounter (Signed)
Pt is asking if the paper work from [employer]/short term disability?  Pt states this would be the 2nd set sent to be completed.   CB#[phone]/MW

## 2015-01-05 NOTE — Telephone Encounter (Signed)
I do have this paperwork.  I did just receive it on Monday.  I will have it ready by tomorrow.

## 2015-01-05 NOTE — Telephone Encounter (Signed)
Do you have this paper work. I don't remember getting anything for her.

## 2015-01-06 NOTE — Telephone Encounter (Signed)
Patient aware of FMLA is completed and faxed. Per patient fax to: [phone].  Patient has an appointment on Monday with Antony ContrasJenni and will pick up a copy of the form.  Thanks,  -Joanna Bishop

## 2015-01-10 ENCOUNTER — Ambulatory Visit (INDEPENDENT_AMBULATORY_CARE_PROVIDER_SITE_OTHER): Payer: BLUE CROSS/BLUE SHIELD | Admitting: Physician Assistant

## 2015-01-10 ENCOUNTER — Encounter: Payer: Self-pay | Admitting: Physician Assistant

## 2015-01-10 VITALS — BP 130/80 | HR 86 | Temp 98.2°F | Resp 16 | Wt 244.0 lb

## 2015-01-10 DIAGNOSIS — J4530 Mild persistent asthma, uncomplicated: Secondary | ICD-10-CM

## 2015-01-10 DIAGNOSIS — G4700 Insomnia, unspecified: Secondary | ICD-10-CM

## 2015-01-10 DIAGNOSIS — F419 Anxiety disorder, unspecified: Secondary | ICD-10-CM

## 2015-01-10 DIAGNOSIS — F3175 Bipolar disorder, in partial remission, most recent episode depressed: Secondary | ICD-10-CM

## 2015-01-10 MED ORDER — DOXYLAMINE SUCCINATE (SLEEP) 25 MG PO TABS: 25.0000 mg | ORAL_TABLET | Freq: Every evening | ORAL | Status: DC | PRN

## 2015-01-10 NOTE — Progress Notes (Signed)
Patient: Joanna Bishop Female    DOB: 03-17-1955   59 y.o.   MRN: [id number] Visit Date: 01/10/2015  Today's Provider: Mar Daring, PA-C   Chief Complaint  Patient presents with  . Follow-up    Anxiety and Bipolar Disorder   Subjective:    HPI Anxiety: Patient here following-up on anxiety disorder.  She has the following symptoms: difficulty concentrating,chest pain, fatigue,feeling of losing control,dizziness,shortness of breath and Insomnia. Onset of symptoms was approximately 2 months ago, gradually worsening since that time. She denies current suicidal and homicidal ideation. Family history significant for anxiety and depression. 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  She complains of the following side effects from the treatment: insomnia.  Bipolar disorder, in partial remission, most recent episode depressed [facility]): Patient is here on her 4 week follow-up. On last office visit patient was worsening. Wellbutrin was increased  to 300 mg daily. She was to  continue to take the Wellbutrin in the morning and the Zoloft at night. She does state that she has noticed some improvement in her symptoms. She does not feel that she is back 100% yet. She still has anxiety with leaving the house. She has been seeing a therapist but states that she would be interested in seeing a psychiatrist at this point. She states that it has been approximately 10-15 years since she has seen a formal psychiatrist.  She did have adverse reactions with the Xanax. She states that when she took the Xanax when she would close her eyes she would see flashes of lights and a Her awake. She states that she was awake for 36 hours when she took the Xanax initially. She discontinued the use and started taking her melatonin again. She has not tried any over-the-counter sleep aids other than melatonin.  She does also have a lot of stress on her. She is primary caregiver for her husband  who has myasthenia gravis. She also has HER-2 granddaughters age 37 and age 42 for which she is primary caregiver. Her and her husband have custody over the children. She has had stress recently with her husband's declining health and physical abilities as well as the fact that her daughter recently got out of prison and has been trying to come around more frequently. She feels that these things could have exacerbated her depression symptoms. She also is curious if menopause could be playing a factor as well. She did have a partial hysterectomy in her 50s but her ovaries were left intact. She would not be able to judge menopause based on her menses.      Allergies  Allergen Reactions  . Penicillins    Previous Medications   ALBUTEROL (PROVENTIL HFA;VENTOLIN HFA) 108 (90 BASE) MCG/ACT INHALER    Inhale 2 puffs into the lungs every 4 (four) hours as needed for wheezing or shortness of breath. Every 4-6 hours as needed   ALPRAZOLAM (XANAX) 0.5 MG TABLET    Take 1 tablet (0.5 mg total) by mouth every 8 (eight) hours as needed.   ASPIRIN 81 MG TABLET    Take by mouth daily.   BUPROPION (WELLBUTRIN XL) 300 MG 24 HR TABLET    Take 1 tablet (300 mg total) by mouth daily.   CALCIUM-MAGNESIUM-VITAMIN D (CALCIUM 500 PO)    Take 1 tablet by mouth 2 (two) times daily.   FLUTICASONE (FLONASE) 50 MCG/ACT NASAL SPRAY    Place 2 sprays into the nose daily.  FLUTICASONE-SALMETEROL (ADVAIR) 250-50 MCG/DOSE AEPB    Inhale 1 puff into the lungs 2 (two) times daily.   MONTELUKAST (SINGULAIR) 10 MG TABLET    Take 1 tablet by mouth daily.   MULTIPLE VITAMIN PO    Take 1 tablet by mouth daily.   SERTRALINE (ZOLOFT) 100 MG TABLET    Take 1.5 tablets (150 mg total) by mouth daily.    Review of Systems  Constitutional: Positive for fatigue. Negative for fever and chills.  Respiratory: Positive for shortness of breath. Negative for cough, chest tightness and wheezing.   Cardiovascular: Positive for chest pain. Negative  for palpitations and leg swelling.  Gastrointestinal: Negative.   Neurological: Positive for dizziness and headaches.  Psychiatric/Behavioral: Positive for sleep disturbance and decreased concentration. The patient is nervous/anxious.     Social History  Substance Use Topics  . Smoking status: Never Smoker   . Smokeless tobacco: Never Used  . Alcohol Use: No   Objective:   BP 130/80 mmHg  Pulse 86  Temp(Src) 98.2 F (36.8 C) (Oral)  Resp 16  Wt 244 lb (110.678 kg)  Physical Exam  Constitutional: She appears well-developed and well-nourished. No distress.  Cardiovascular: Normal rate, regular rhythm and normal heart sounds.  Exam reveals no gallop and no friction rub.   No murmur heard. Pulmonary/Chest: Effort normal and breath sounds normal. No respiratory distress. She has no wheezes. She has no rales.  Skin: She is not diaphoretic.  Psychiatric: She has a normal mood and affect. Her behavior is normal. Judgment and thought content normal.  Vitals reviewed.       Assessment & Plan:     1. Bipolar disorder, in partial remission, most recent episode depressed ([facility]) She has been doing okay since the increase in the Wellbutrin. I will continue her Zoloft at night as prescribed and continue the Wellbutrin 300 mg during the day. I will also put in referral for her to psychiatry. I think she will would have a good rapport with Dr. Nicolasa Ducking and have requested an appointment with her first.  If there were no availabilities we will get her in with first available. I will see her back in 4 weeks. She is to call the office if her symptoms worsen or if she develops any suicidal or homicidal ideations in the meantime. - Ambulatory referral to Psychiatry  2. Anxiety Xanax discontinued due to side effects. We'll continue treatment with Zoloft and Wellbutrin. Referral to psychiatry has been made. She is to call the office if her symptoms worsen. - Ambulatory referral to Psychiatry  3.  Insomnia Xanax was discontinued due to side effects. The Xanax did cause her insomnia to worsen. She has been taking melatonin for the insomnia. She has not tried any over-the-counter sleep aids. I did send a prescription for Unisom as below to see if this may help. If she does not get a good sleep with Unisom we may try other sleep aids such as Ambien. She is to call the office if she has any adverse effects with the Unisom as she did with the Xanax. If not I will see her back in 4 weeks to reevaluate and see how she is doing. - doxylamine, Sleep, (UNISOM) 25 MG tablet; Take 1 tablet (25 mg total) by mouth at bedtime as needed.  Dispense: 30 tablet; Refill: 1 - Ambulatory referral to Psychiatry  4. AB (asthmatic bronchitis), mild persistent, uncomplicated She states today that she has noticed marked improvement in her cough but that  of recent she feels as if she cannot take a deep breath. She states that she will feel a she takes a deep breath but it feels like that it doesn't fill her lungs completely. She does use Advair daily and has her albuterol inhaler as needed. I gave her a sample today of Incruse for her to try instead of the Advair. I advised her to call the office if this works better and I will send in a prescription for her. I will also follow up with her in 4 weeks to see how she is doing at that time. If she does not have an acute exacerbation at that time we may get a spirometry as well.       Mar Daring, PA-C  [facility]

## 2015-01-10 NOTE — Patient Instructions (Signed)
Doxylamine tablets What is this medicine? DOXYLAMINE (dox IL a meen) is an antihistamine. It is used to treat sleeping problems. This medicine may be used for other purposes; ask your health care provider or pharmacist if you have questions. What should I tell my health care provider before I take this medicine? They need to know if you have any of these conditions: -glaucoma -heart disease -liver disease -lung or breathing disease, like asthma or emphysema -pain or trouble passing urine -prostate trouble -ulcers or other stomach problems -an unusual or allergic reaction to doxylamine, other medicines, foods, dyes, or preservatives -pregnant or trying to get pregnant -breast-feeding How should I use this medicine? Take this medicine by mouth with a glass of water. Follow the directions on the package or prescription label. Take this medicine 30 minutes before you go to bed. Do not take it more often than directed. Talk to your pediatrician regarding the use of this medicine in children. While this drug may be prescribed for children as young as 57 years old for selected conditions, precautions do apply. Patients over 57 years old may have a stronger reaction and need a smaller dose. Overdosage: If you think you have taken too much of this medicine contact a poison control center or emergency room at once. NOTE: This medicine is only for you. Do not share this medicine with others. What if I miss a dose? If you miss a dose, take it as soon as you can. If it is almost time for your next dose, take only that dose. Do not take double or extra doses. What may interact with this medicine? -alcohol -MAOIs like Carbex, Eldepryl, Marplan, Nardil, and Parnate -some medicines for allergies, cold, or cough -medicines that make you sleepy This list may not describe all possible interactions. Give your health care provider a list of all the medicines, herbs, non-prescription drugs, or dietary  supplements you use. Also tell them if you smoke, drink alcohol, or use illegal drugs. Some items may interact with your medicine. What should I watch for while using this medicine? See your doctor or healthcare professional if you need to use this sleep aid for more than 2 weeks. Your mouth may get dry. Chewing sugarless gum or sucking hard candy, and drinking plenty of water may help. Contact your doctor if the problem does not go away or is severe. This medicine may cause dry eyes and blurred vision. If you wear contact lenses you may feel some discomfort. Lubricating drops may help. See your eye doctor if the problem does not go away or is severe. You may get drowsy or dizzy. Do not drive, use machinery, or do anything that needs mental alertness until you know how this medicine affects you. Do not stand or sit up quickly, especially if you are an older patient. This reduces the risk of dizzy or fainting spells. Alcohol may interfere with the effect of this medicine. Avoid alcoholic drinks. What side effects may I notice from receiving this medicine? Side effects that you should report to your doctor or health care professional as soon as possible: -allergic reactions like skin rash, itching or hives, swelling of the face, lips, or tongue -changes in vision -confused, agitated, or nervous -fast, irregular heartbeat -feeling faint or lightheaded, falls -muscle or facial twitches -seizure -trouble passing urine or change in the amount of urine -unusual bleeding or bruising Side effects that usually do not require medical attention (report to your doctor or health care professional if they  continue or are bothersome): -dry mouth -headache -loss of appetite -stomach upset, vomiting This list may not describe all possible side effects. Call your doctor for medical advice about side effects. You may report side effects to FDA at 1-800-FDA-1088. Where should I keep my medicine? Keep out of the  reach of children. Store at room temperature between 15 and 30 degrees C (59 and 86 degrees F). Do not freeze. Protect from light. Throw away any unused medicine after the expiration date. NOTE: This sheet is a summary. It may not cover all possible information. If you have questions about this medicine, talk to your doctor, pharmacist, or health care provider.    2016, Elsevier/Gold Standard. (2007-06-05 17:57:55) Umeclidinium inhalation powder What is this medicine? UMECLIDINIUM (ue MEK li DIN ee um) is a bronchodilator. It helps open up the airways of your lungs. It is for chronic obstructive pulmonary disease (COPD), including chronic bronchitis or emphysema. Do NOT use for asthma or an acute asthma attack. Do NOT use for a COPD attack. This medicine may be used for other purposes; ask your health care provider or pharmacist if you have questions. What should I tell my health care provider before I take this medicine? They need to know if you have any of these conditions: -bladder problems or difficulty passing urine -glaucoma -prostate trouble -an unusual or allergic reaction to umeclidinium, lactose, milk proteins, other medicines, foods, dyes, or preservatives -pregnant or trying to get pregnant -breast-feeding How should I use this medicine? This medicine is inhaled through the mouth. It is used once per day. Follow the directions on the prescription label. Do not use a spacer device with this inhaler. Take your medicine at regular intervals. Do not take your medicine more often than directed. Do not stop taking except on your doctor's advice. Make sure that you are using your inhaler correctly. Ask you doctor or health care provider if you have any questions. Talk to your pediatrician regarding the use of this medicine in children. Special care may be needed. Overdosage: If you think you have taken too much of this medicine contact a poison control center or emergency room at  once. NOTE: This medicine is only for you. Do not share this medicine with others. What if I miss a dose? If you miss a dose, use it as soon as you can. If it is almost time for your next dose, use only that dose and continue with your regular schedule. Do not use double or extra doses. What may interact with this medicine? -atropine -antihistamines for allergy, cough and cold -certain medicines for bladder problems like oxybutynin, tolterodine -certain medicines for stomach problems like dicyclomine, hyoscyamine -certain medicines for travel sickness like scopolamine -certain medicines for Parkinson's disease like benztropine, trihexyphenidyl -ipratropium -tiotropium This list may not describe all possible interactions. Give your health care provider a list of all the medicines, herbs, non-prescription drugs, or dietary supplements you use. Also tell them if you smoke, drink alcohol, or use illegal drugs. Some items may interact with your medicine. What should I watch for while using this medicine? Visit your doctor or health care professional for regular checkups. Tell your doctor or health care professional if your symptoms do not get better. If your symptoms get worse or if you need your short-acting inhalers more often, call your doctor right away. Do not use this medicine more than once every 24 hours. What side effects may I notice from receiving this medicine? Side effects that you should  report to your doctor or health care professional as soon as possible: -allergic reactions like skin rash or hives, swelling of the face, lips, or tongue -breathing problems right after inhaling your medicine -changes in vision -cough -fast or irregular heartbeat -feeling faint or lightheaded, falls -fever or chills -sore throat -trouble passing urine or change in the amount of urine Side effects that usually do not require medical attention (Report these to your doctor or health care  professional if they continue or are bothersome.): -headache -muscle pain -nausea -stomach pain This list may not describe all possible side effects. Call your doctor for medical advice about side effects. You may report side effects to FDA at 1-800-FDA-1088. Where should I keep my medicine? Keep out of the reach of children. Store at room temperature between 15 and 30 degrees C (59 and 86 degrees F). Store in a dry place away from direct heat or sunlight. Throw away 6 weeks after you remove the inhaler from the foil tray, or after the dose indicator reads 0, whichever comes first. Throw away any unopened packages after the expiration date. NOTE: This sheet is a summary. It may not cover all possible information. If you have questions about this medicine, talk to your doctor, pharmacist, or health care provider.    2016, Elsevier/Gold Standard. (2012-06-27 16:37:09)

## 2015-01-31 ENCOUNTER — Telehealth: Payer: Self-pay | Admitting: Physician Assistant

## 2015-01-31 ENCOUNTER — Encounter: Payer: Self-pay | Admitting: Physician Assistant

## 2015-01-31 NOTE — Telephone Encounter (Signed)
LMTCB  Thanks,  -Danyela Posas 

## 2015-01-31 NOTE — Telephone Encounter (Signed)
Pt stated her work faxed disability forms to the office on 01/27/15. Pt wanted to make sure that they were received and going to be faxed back. Pt also stated that she can not see the other doctor until 02/11/15 but was supposed to return to work on 02/08/15 so she needed to at least be out until after the appt on 02/11/15. Pt would like a nurse to return her call to update her. Thanks TNP

## 2015-01-31 NOTE — Telephone Encounter (Signed)
Pt returned call. Thanks TNP °

## 2015-01-31 NOTE — Telephone Encounter (Signed)
Patient advised as directed below. Informed patient that we have not receive any disability form on 12/1 but we have a copy of the one back in  November 110. Per patient wants a copy of the form and will pick it up tomorrow.  Thanks,  -[name]

## 2015-01-31 NOTE — Telephone Encounter (Signed)
I called patient regarding her disability form. But she is needing a doctor note until after the appointment on 02/11/15

## 2015-01-31 NOTE — Telephone Encounter (Signed)
Letter has been printed for work and can either be faxed or picked up at the front desk.

## 2015-02-01 ENCOUNTER — Telehealth: Payer: Self-pay | Admitting: Physician Assistant

## 2015-02-01 NOTE — Telephone Encounter (Signed)
Pt called wanting Joyceline to be on the look out for her disability papers.  They should come in on a fax.  Call back is   [phone]  Thanks Barth Kirkseri

## 2015-02-02 NOTE — Telephone Encounter (Signed)
We received the paper work. Will call and notify patient when ready and when fax.  Thanks,  -Lanyah Spengler

## 2015-02-03 NOTE — Telephone Encounter (Signed)
Advised Rinaldo Cloudamela forms have been completed and Faxed.  Thanks,  -Matina Rodier R. 02/03/15

## 2015-02-07 ENCOUNTER — Ambulatory Visit: Payer: BLUE CROSS/BLUE SHIELD | Admitting: Physician Assistant

## 2015-02-08 ENCOUNTER — Telehealth: Payer: Self-pay

## 2015-02-08 NOTE — Telephone Encounter (Signed)
FYIEdmund Hilda: LaRoy nurse from [facility] [employer] called regarding the dates patient have been seen in our office. Also was requesting office notes from us and the Psychiatrist. I told him to called patient Psychiatrist office and to request the notes. A consent needs to be sign. Also Nurse stated that received the paper work but not office notes. I asked if he can fax over a form requesting the notes specifically.  I was not sure if we were going to send them.   Thanks,  -Shantrell Placzek

## 2015-02-09 NOTE — Telephone Encounter (Signed)
If Pam says that it is ok for us to send them then we can.  She would have to give verbal consent to us or written.  I think she has an appt coming up tomorrow. Thanks. -JB

## 2015-02-10 ENCOUNTER — Ambulatory Visit (INDEPENDENT_AMBULATORY_CARE_PROVIDER_SITE_OTHER): Payer: BLUE CROSS/BLUE SHIELD | Admitting: Physician Assistant

## 2015-02-10 ENCOUNTER — Encounter: Payer: Self-pay | Admitting: Physician Assistant

## 2015-02-10 VITALS — BP 126/70 | HR 86 | Temp 98.1°F | Resp 16 | Wt 240.8 lb

## 2015-02-10 DIAGNOSIS — J4530 Mild persistent asthma, uncomplicated: Secondary | ICD-10-CM | POA: Diagnosis not present

## 2015-02-10 DIAGNOSIS — G4700 Insomnia, unspecified: Secondary | ICD-10-CM | POA: Diagnosis not present

## 2015-02-10 DIAGNOSIS — F3162 Bipolar disorder, current episode mixed, moderate: Secondary | ICD-10-CM | POA: Diagnosis not present

## 2015-02-10 DIAGNOSIS — F4001 Agoraphobia with panic disorder: Secondary | ICD-10-CM | POA: Diagnosis not present

## 2015-02-10 MED ORDER — UMECLIDINIUM BROMIDE 62.5 MCG/INH IN AEPB: 1.0000 | INHALATION_SPRAY | Freq: Every day | RESPIRATORY_TRACT | Status: DC

## 2015-02-10 NOTE — Progress Notes (Signed)
Patient: Joanna Bishop Female    DOB: [birth year]   59 y.o.   MRN: [id number] Visit Date: 02/10/2015  Today's Provider: Margaretann Loveless, PA-C   Chief Complaint  Patient presents with  . Follow-up    Bipolar Disorder,Insomnia,Asthmatic brochitis   Subjective:    Insomnia Primary symptoms: sleep disturbance, difficulty falling asleep, frequent awakening, no malaise/fatigue, napping.  The current episode started 1 to 4 weeks ago. The problem occurs nightly. The problem is unchanged. The symptoms are aggravated by anxiety. How many beverages per day that contain caffeine: 0 - 1.  Types of beverages you drink: soda and coffee. Past treatments include medication. Typical bedtime:  10-11 P.M..  How long after going to bed to you fall asleep: 30-60 minutes.   Duration of naps:  One to two hours.     1. AB (asthmatic bronchitis), mild persistent, uncomplicated: Last office visit patient was feeling like she couldn't take deep breath. Patient was given samples of Incruse to try instead of Advair. Patient states the samples that was given of Incruse are working well.  She has not had a relapse of acute exacerbation since switching to Incruse.    2. Bipolar disorder, in partial remission, most recent episode depressed [facility]): Patient is here for her 4 week follow-up on bipolar disorder. She does continue to have a lot of stress on her. She is primary caregiver for her husband who was diagnosed with myasthenia gravis, which is now felt to be ALS due to rapid progression. He is being sent to a specialist at [facility] for further evaluation.  She also has her 2 granddaughters, age 34 and age 15, for which she is primary caregiver. Patient on last office visit was improving on her treatments. Patient was also referred to a psychiatrist Dr. Maryruth Bun. Per patient went to Dr. Caryn Section on 02/09/15 and she took her off the wellbutrin and put patient on Depakote. Patient was advised to take the Depakote in night  and Zoloft in the morning.  She also wrote to keep the patient out of work until 03/07/15 for her to transition to the new treatment plan.  She was also diagnosed with agorophobia with panic attacks.  She did however discontinue her xanax for panic attacks.      Allergies  Allergen Reactions  . Penicillins    Previous Medications   ALBUTEROL (PROVENTIL HFA;VENTOLIN HFA) 108 (90 BASE) MCG/ACT INHALER    Inhale 2 puffs into the lungs every 4 (four) hours as needed for wheezing or shortness of breath. Every 4-6 hours as needed   ASPIRIN 81 MG TABLET    Take by mouth daily.   CALCIUM-MAGNESIUM-VITAMIN D (CALCIUM 500 PO)    Take 1 tablet by mouth 2 (two) times daily.   DIVALPROEX (DEPAKOTE ER) 250 MG 24 HR TABLET    Take 250 mg by mouth at bedtime.   DOXYLAMINE, SLEEP, (UNISOM) 25 MG TABLET    Take 1 tablet (25 mg total) by mouth at bedtime as needed.   FLUTICASONE (FLONASE) 50 MCG/ACT NASAL SPRAY    Place 2 sprays into the nose daily.   FLUTICASONE-SALMETEROL (ADVAIR) 250-50 MCG/DOSE AEPB    Inhale 1 puff into the lungs 2 (two) times daily.   MONTELUKAST (SINGULAIR) 10 MG TABLET    Take 1 tablet by mouth daily.   MULTIPLE VITAMIN PO    Take 1 tablet by mouth daily.   SERTRALINE (ZOLOFT) 100 MG TABLET    Take  1.5 tablets (150 mg total) by mouth daily.    Review of Systems  Constitutional: Positive for fatigue. Negative for fever, malaise/fatigue, diaphoresis, appetite change and unexpected weight change.  HENT: Negative.   Eyes: Negative.   Respiratory: Positive for cough and shortness of breath. Negative for chest tightness and wheezing.   Cardiovascular: Negative.   Gastrointestinal: Negative.   Endocrine: Negative.   Genitourinary: Negative.   Musculoskeletal: Negative.   Allergic/Immunologic: Negative.   Neurological: Negative.   Hematological: Negative.   Psychiatric/Behavioral: Positive for sleep disturbance, dysphoric mood and agitation. Negative for suicidal ideas and self-injury.  The patient is nervous/anxious and has insomnia.     Social History  Substance Use Topics  . Smoking status: Never Smoker   . Smokeless tobacco: Never Used  . Alcohol Use: No   Objective:   BP 126/70 mmHg  Pulse 86  Temp(Src) 98.1 F (36.7 C) (Oral)  Resp 16  Wt 240 lb 12.8 oz (109.226 kg)  Physical Exam  Constitutional: She appears well-developed and well-nourished. No distress.  Neck: Normal range of motion. Neck supple. No JVD present. No tracheal deviation present. No thyromegaly present.  Cardiovascular: Normal rate, regular rhythm and normal heart sounds.  Exam reveals no gallop and no friction rub.   No murmur heard. Pulmonary/Chest: Effort normal and breath sounds normal. No respiratory distress. She has no wheezes. She has no rales.  Lymphadenopathy:    She has no cervical adenopathy.  Skin: She is not diaphoretic.  Psychiatric: She has a normal mood and affect. Her behavior is normal. Judgment and thought content normal.  Vitals reviewed.       Assessment & Plan:     1. Bipolar disorder, current episode mixed, moderate (HCC) Still not fully controlled. Patient did follow through with appointment with Dr. Maryruth BunKapur. At that visit patient was switched from Wellbutrin and Zoloft to Zoloft and Depakote. She is currently undergoing a transition from lowering the Wellbutrin to discontinue use as well as titrating the dose of Depakote up to therapeutic levels. She is going to be undergoing lab work to make sure that all labs are stable with these changes. This will be done on December 27th at her follow-up with Dr. Maryruth BunKapur. She is currently been put out of work until 03/07/2015 to fully be able to transition to the new medications and make sure that she is stable at that dose. I will follow-up with her in 3 weeks, which will be after her appointment with Dr. Maryruth BunKapur that prior to her return to work to make sure that she is still stable for this transition.  2. AB (asthmatic  bronchitis), mild persistent, uncomplicated This is currently stable on Incruse daily and albuterol as needed. I did prescribe this for her today as previously she was given samples and I did not have any samples in the office today. I did however give her a prescription payment card that will help with copayments on the inhalers. She is to call the office if she has any issues with getting this medication filled, worsening symptoms, questions or concerns. If not I will see her back in 3 weeks and we can reevaluate how she is doing again at that time. I would like to have a repeat spirometry on her as she has not had one in a while but we have been holding off due to the worsening bipolar and medication changes. When she is more stabilized with the medications we will get a repeat spirometry. - Umeclidinium Bromide (  INCRUSE ELLIPTA) 62.5 MCG/INH AEPB; Inhale 1-2 puffs into the lungs daily.  Dispense: 1 each; Refill: 13  3. Insomnia Worsening. Thought to have been secondary to some of her medications as well as a hypomania state mixed with depression. Dr. Maryruth Bun has put her on Depakote at night. We are awaiting for dosage to reach therapeutic goal to see if this helps the insomnia.  4. Agoraphobia with panic disorder This has been improving with treatment. She has only had 2 days of the new treatment of Depakote with the Zoloft so we're unsure of how effective this will be for her agoraphobia. She was however improving with the Zoloft and Wellbutrin prior to the medication change. She will be following up with Dr. Maryruth Bun on December 27 and then again will follow up with me in 3 weeks. She is to call the office if she has any worsening symptoms, questions or concerns.       Margaretann Loveless, PA-C  [facility]

## 2015-02-10 NOTE — Patient Instructions (Signed)
Umeclidinium inhalation powder  What is this medicine?  UMECLIDINIUM (ue MEK li DIN ee um) is a bronchodilator. It helps open up the airways of your lungs. It is for chronic obstructive pulmonary disease (COPD), including chronic bronchitis or emphysema. Do NOT use for asthma or an acute asthma attack. Do NOT use for a COPD attack.  This medicine may be used for other purposes; ask your health care provider or pharmacist if you have questions.  What should I tell my health care provider before I take this medicine?  They need to know if you have any of these conditions:  -bladder problems or difficulty passing urine  -glaucoma  -prostate trouble  -an unusual or allergic reaction to umeclidinium, lactose, milk proteins, other medicines, foods, dyes, or preservatives  -pregnant or trying to get pregnant  -breast-feeding  How should I use this medicine?  This medicine is inhaled through the mouth. It is used once per day. Follow the directions on the prescription label. Do not use a spacer device with this inhaler. Take your medicine at regular intervals. Do not take your medicine more often than directed. Do not stop taking except on your doctor's advice. Make sure that you are using your inhaler correctly. Ask you doctor or health care provider if you have any questions.  Talk to your pediatrician regarding the use of this medicine in children. Special care may be needed.  Overdosage: If you think you have taken too much of this medicine contact a poison control center or emergency room at once.  NOTE: This medicine is only for you. Do not share this medicine with others.  What if I miss a dose?  If you miss a dose, use it as soon as you can. If it is almost time for your next dose, use only that dose and continue with your regular schedule. Do not use double or extra doses.  What may interact with this medicine?  -atropine  -antihistamines for allergy, cough and cold  -certain medicines for bladder problems like  oxybutynin, tolterodine  -certain medicines for stomach problems like dicyclomine, hyoscyamine  -certain medicines for travel sickness like scopolamine  -certain medicines for Parkinson's disease like benztropine, trihexyphenidyl  -ipratropium  -tiotropium  This list may not describe all possible interactions. Give your health care provider a list of all the medicines, herbs, non-prescription drugs, or dietary supplements you use. Also tell them if you smoke, drink alcohol, or use illegal drugs. Some items may interact with your medicine.  What should I watch for while using this medicine?  Visit your doctor or health care professional for regular checkups. Tell your doctor or health care professional if your symptoms do not get better.  If your symptoms get worse or if you need your short-acting inhalers more often, call your doctor right away. Do not use this medicine more than once every 24 hours.  What side effects may I notice from receiving this medicine?  Side effects that you should report to your doctor or health care professional as soon as possible:  -allergic reactions like skin rash or hives, swelling of the face, lips, or tongue  -breathing problems right after inhaling your medicine  -changes in vision  -cough  -fast or irregular heartbeat  -feeling faint or lightheaded, falls  -fever or chills  -sore throat  -trouble passing urine or change in the amount of urine  Side effects that usually do not require medical attention (Report these to your doctor or health care   professional if they continue or are bothersome.):  -headache  -muscle pain  -nausea  -stomach pain  This list may not describe all possible side effects. Call your doctor for medical advice about side effects. You may report side effects to FDA at 1-800-FDA-1088.  Where should I keep my medicine?  Keep out of the reach of children.  Store at room temperature between 15 and 30 degrees C (59 and 86 degrees F). Store in a dry place away from  direct heat or sunlight. Throw away 6 weeks after you remove the inhaler from the foil tray, or after the dose indicator reads 0, whichever comes first. Throw away any unopened packages after the expiration date.  NOTE: This sheet is a summary. It may not cover all possible information. If you have questions about this medicine, talk to your doctor, pharmacist, or health care provider.     © 2016, Elsevier/Gold Standard. (2012-06-27 16:37:09)

## 2015-02-10 NOTE — Telephone Encounter (Signed)
Per Rinaldo CloudPamela sign consent at [employer] [facility] office. Not sure if it was to release notes to the disability people.  With our notes she may not give consent to be send. Antony ContrasJenni will talk with patient and come up with a plan.  Thanks,  -Joseline

## 2015-02-11 ENCOUNTER — Other Ambulatory Visit: Payer: Self-pay | Admitting: Physician Assistant

## 2015-02-11 DIAGNOSIS — J4530 Mild persistent asthma, uncomplicated: Secondary | ICD-10-CM

## 2015-02-11 MED ORDER — UMECLIDINIUM BROMIDE 62.5 MCG/INH IN AEPB: 1.0000 | INHALATION_SPRAY | Freq: Every day | RESPIRATORY_TRACT | Status: DC

## 2015-02-18 ENCOUNTER — Telehealth: Payer: Self-pay | Admitting: Emergency Medicine

## 2015-02-18 DIAGNOSIS — J0141 Acute recurrent pansinusitis: Secondary | ICD-10-CM

## 2015-02-18 MED ORDER — SULFAMETHOXAZOLE-TRIMETHOPRIM 800-160 MG PO TABS: 1.0000 | ORAL_TABLET | Freq: Two times a day (BID) | ORAL | Status: DC

## 2015-02-18 NOTE — Telephone Encounter (Signed)
Antibiotic sent to [facility] [address]  Increase use of albuterol inhaler as needed.  If not improving by early next week with use of albuterol let me know and I will send in prednisone if needed.  If she needs cough syrup let me know.  I can print and have up front for pick up. Thanks.

## 2015-02-18 NOTE — Telephone Encounter (Signed)
Pt wanted to let you know that she has a sinus infection and she thinks that it is going into bronchitis. She said she was seen for this and was told to call if she did not get any better. Please advise.   [facility] [address]  CB# [phone]

## 2015-02-18 NOTE — Telephone Encounter (Signed)
LMTCB 02/18/2015  Thanks,   -Hao Dion  

## 2015-02-23 NOTE — Telephone Encounter (Signed)
Pt advised-aa 

## 2015-03-01 ENCOUNTER — Ambulatory Visit (INDEPENDENT_AMBULATORY_CARE_PROVIDER_SITE_OTHER): Payer: BLUE CROSS/BLUE SHIELD | Admitting: Physician Assistant

## 2015-03-01 ENCOUNTER — Encounter: Payer: Self-pay | Admitting: Physician Assistant

## 2015-03-01 VITALS — BP 110/60 | HR 97 | Temp 98.2°F | Resp 16 | Wt 247.4 lb

## 2015-03-01 DIAGNOSIS — F3162 Bipolar disorder, current episode mixed, moderate: Secondary | ICD-10-CM | POA: Diagnosis not present

## 2015-03-01 DIAGNOSIS — J4530 Mild persistent asthma, uncomplicated: Secondary | ICD-10-CM

## 2015-03-01 NOTE — Progress Notes (Signed)
Patient: Joanna Bishop Female    DOB: [birth year]   60 y.o.   MRN: [id number] Visit Date: 03/01/2015  Today's Provider: Margaretann Loveless, PA-C   Chief Complaint  Patient presents with  . Follow-up    Bipolar Disorder and Asthmatic Bronchitis   Subjective:    HPI 1. AB (asthmatic bronchitis), mild persistent, uncomplicated: Patient was seen on 02/10/15. Patient states Incruse works well. Patient is not wheezing today, no SOB, and feels like when she takes a deep breath is actually going to her lungs. Has a mild cough today but feels is the change of weather.  2. Bipolar disorder, current episode mixed, moderate (HCC): Patient here for her 3 weeks follow up. Patient followed with Doctor Maryruth Bun  On Dec. 27th and got labs done. Patient states Dr. Maryruth Bun took her off from Zoloft and put Latuda 40 mg. Patient reports doing well on the medication specially with the sleep. Patient states that with the Depakote was feeling more angry and losing control. Patient states her granddaughter described her as a machine gun. This incident happened on the 26 th of Dec.Per patient Dr. Maryruth Bun lower her Depakote to 4 times a day instead of 5 times a day.     Allergies  Allergen Reactions  . Penicillins    Previous Medications   ALBUTEROL (PROVENTIL HFA;VENTOLIN HFA) 108 (90 BASE) MCG/ACT INHALER    Inhale 2 puffs into the lungs every 4 (four) hours as needed for wheezing or shortness of breath. Every 4-6 hours as needed   ASPIRIN 81 MG TABLET    Take by mouth daily.   CALCIUM-MAGNESIUM-VITAMIN D (CALCIUM 500 PO)    Take 1 tablet by mouth 2 (two) times daily.   DIVALPROEX (DEPAKOTE ER) 250 MG 24 HR TABLET    Take 250 mg by mouth at bedtime.   DOXYLAMINE, SLEEP, (UNISOM) 25 MG TABLET    Take 1 tablet (25 mg total) by mouth at bedtime as needed.   FLUTICASONE (FLONASE) 50 MCG/ACT NASAL SPRAY    Place 2 sprays into the nose daily.   MONTELUKAST (SINGULAIR) 10 MG TABLET    Take 1 tablet by mouth daily.   MULTIPLE VITAMIN PO    Take 1 tablet by mouth daily.   SERTRALINE (ZOLOFT) 100 MG TABLET    Take 1.5 tablets (150 mg total) by mouth daily.   UMECLIDINIUM BROMIDE (INCRUSE ELLIPTA) 62.5 MCG/INH AEPB    Inhale 1 puff into the lungs daily.    Review of Systems  Constitutional: Negative.   HENT: Negative.  Negative for congestion.   Respiratory: Positive for cough. Negative for chest tightness, shortness of breath and wheezing.   Cardiovascular: Negative.   Gastrointestinal: Negative.   Neurological: Negative.   Psychiatric/Behavioral: Negative.     Social History  Substance Use Topics  . Smoking status: Never Smoker   . Smokeless tobacco: Never Used  . Alcohol Use: No   Objective:   BP 110/60 mmHg  Pulse 97  Temp(Src) 98.2 F (36.8 C) (Oral)  Resp 16  Wt 247 lb 6.4 oz (112.22 kg)  SpO2 96%  Physical Exam  Constitutional: She appears well-developed and well-nourished. No distress.  Neck: Normal range of motion. Neck supple. No tracheal deviation present. No thyromegaly present.  Cardiovascular: Normal rate, regular rhythm and normal heart sounds.  Exam reveals no gallop and no friction rub.   No murmur heard. Pulmonary/Chest: Effort normal and breath sounds normal. No respiratory distress. She has no  wheezes. She has no rales.  Lymphadenopathy:    She has no cervical adenopathy.  Skin: She is not diaphoretic.  Psychiatric: She has a normal mood and affect. Her behavior is normal. Judgment and thought content normal.  Vitals reviewed.       Assessment & Plan:     1. AB (asthmatic bronchitis), mild persistent, uncomplicated Currently stable on Incruse.  We will continue current medical treatment plan. We did discuss possibly doing spirometry today but she states that she has another doctor's appointment with her husband for which she had to get to. We will hold off on testing spirometry at this time due to that. I will see her back in 3 months and we will check spirometry  then. She is to call the office in the meantime if she has any exacerbations or worsening symptoms.  2. Bipolar disorder, current episode mixed, moderate (HCC) Mood is improved tremendously since previous. She is currently on Depakote 4 times daily and Latuda. Zoloft and Wellbutrin have been discontinued. She does follow-up with Dr. Maryruth BunKapur again on Friday, 03/04/2015. She states she does feel much better. She did become slightly tearful when talking about her husband's recent diagnosis of ALS but she did not cry and was overall in a seemingly good mood. She is to call the office if she has any worsening symptoms. - lurasidone (LATUDA) 40 MG TABS tablet; Take 1 tablet (40 mg total) by mouth daily with breakfast.  Dispense: 30 tablet; Refill: 0       Margaretann LovelessJennifer M Burnette, PA-C  [facility]

## 2015-03-01 NOTE — Patient Instructions (Signed)
Umeclidinium inhalation powder  What is this medicine?  UMECLIDINIUM (ue MEK li DIN ee um) is a bronchodilator. It helps open up the airways of your lungs. It is for chronic obstructive pulmonary disease (COPD), including chronic bronchitis or emphysema. Do NOT use for asthma or an acute asthma attack. Do NOT use for a COPD attack.  This medicine may be used for other purposes; ask your health care provider or pharmacist if you have questions.  What should I tell my health care provider before I take this medicine?  They need to know if you have any of these conditions:  -bladder problems or difficulty passing urine  -glaucoma  -prostate trouble  -an unusual or allergic reaction to umeclidinium, lactose, milk proteins, other medicines, foods, dyes, or preservatives  -pregnant or trying to get pregnant  -breast-feeding  How should I use this medicine?  This medicine is inhaled through the mouth. It is used once per day. Follow the directions on the prescription label. Do not use a spacer device with this inhaler. Take your medicine at regular intervals. Do not take your medicine more often than directed. Do not stop taking except on your doctor's advice. Make sure that you are using your inhaler correctly. Ask you doctor or health care provider if you have any questions.  Talk to your pediatrician regarding the use of this medicine in children. Special care may be needed.  Overdosage: If you think you have taken too much of this medicine contact a poison control center or emergency room at once.  NOTE: This medicine is only for you. Do not share this medicine with others.  What if I miss a dose?  If you miss a dose, use it as soon as you can. If it is almost time for your next dose, use only that dose and continue with your regular schedule. Do not use double or extra doses.  What may interact with this medicine?  -atropine  -antihistamines for allergy, cough and cold  -certain medicines for bladder problems like  oxybutynin, tolterodine  -certain medicines for stomach problems like dicyclomine, hyoscyamine  -certain medicines for travel sickness like scopolamine  -certain medicines for Parkinson's disease like benztropine, trihexyphenidyl  -ipratropium  -tiotropium  This list may not describe all possible interactions. Give your health care provider a list of all the medicines, herbs, non-prescription drugs, or dietary supplements you use. Also tell them if you smoke, drink alcohol, or use illegal drugs. Some items may interact with your medicine.  What should I watch for while using this medicine?  Visit your doctor or health care professional for regular checkups. Tell your doctor or health care professional if your symptoms do not get better.  If your symptoms get worse or if you need your short-acting inhalers more often, call your doctor right away. Do not use this medicine more than once every 24 hours.  What side effects may I notice from receiving this medicine?  Side effects that you should report to your doctor or health care professional as soon as possible:  -allergic reactions like skin rash or hives, swelling of the face, lips, or tongue  -breathing problems right after inhaling your medicine  -changes in vision  -cough  -fast or irregular heartbeat  -feeling faint or lightheaded, falls  -fever or chills  -sore throat  -trouble passing urine or change in the amount of urine  Side effects that usually do not require medical attention (Report these to your doctor or health care   professional if they continue or are bothersome.):  -headache  -muscle pain  -nausea  -stomach pain  This list may not describe all possible side effects. Call your doctor for medical advice about side effects. You may report side effects to FDA at 1-800-FDA-1088.  Where should I keep my medicine?  Keep out of the reach of children.  Store at room temperature between 15 and 30 degrees C (59 and 86 degrees F). Store in a dry place away from  direct heat or sunlight. Throw away 6 weeks after you remove the inhaler from the foil tray, or after the dose indicator reads 0, whichever comes first. Throw away any unopened packages after the expiration date.  NOTE: This sheet is a summary. It may not cover all possible information. If you have questions about this medicine, talk to your doctor, pharmacist, or health care provider.     © 2016, Elsevier/Gold Standard. (2012-06-27 16:37:09)

## 2015-03-01 NOTE — Addendum Note (Signed)
Addended by: Margaretann LovelessBURNETTE, Field Staniszewski M on: 03/01/2015 10:38 AM   Modules accepted: Orders, Medications

## 2015-03-14 ENCOUNTER — Ambulatory Visit: Payer: BLUE CROSS/BLUE SHIELD | Admitting: Physician Assistant

## 2015-04-04 ENCOUNTER — Telehealth: Payer: Self-pay | Admitting: Physician Assistant

## 2015-04-04 DIAGNOSIS — J4541 Moderate persistent asthma with (acute) exacerbation: Secondary | ICD-10-CM

## 2015-04-04 MED ORDER — PREDNISONE 10 MG PO TABS: ORAL_TABLET | ORAL | Status: DC

## 2015-04-04 MED ORDER — AZITHROMYCIN 250 MG PO TABS: ORAL_TABLET | ORAL | Status: DC

## 2015-04-04 NOTE — Telephone Encounter (Signed)
Zpak and prednisone sent to [facility]. If no improvement she will need to be seen.

## 2015-04-04 NOTE — Telephone Encounter (Signed)
Patient reports that she has had a lingering cough that has gotten worse over the last 3 days. She reports that she may be developing bronchitis again. She reports that you had prescribed prednisone before and that helped her symptoms the best. Patient is requesting that prednisone be called into the pharmacy. She reports that she has had a low grade fever, coughing up thick green mucus, and reports that it's mainly in her chest. Patient uses [facility] on [address]. Thanks!

## 2015-04-04 NOTE — Telephone Encounter (Signed)
Advised patient as below.  

## 2015-04-04 NOTE — Telephone Encounter (Signed)
Yes if you could see what is going on please. I think she may have trouble getting in unfortunately due to the health of her husband.  Thanks.

## 2015-04-04 NOTE — Telephone Encounter (Signed)
Joanna Bishop with [facility] [address] called advise of drug interactions with the antibiotic.    CB#[phone]

## 2015-04-04 NOTE — Telephone Encounter (Signed)
Pt would like to speak with Antony Contras b/c she thinks she is getting bronchitis again. I advised that we can not treat over the phone and pt stated that she would like to speak with Antony Contras before she schedules an appt. Thanks TNP

## 2015-04-04 NOTE — Telephone Encounter (Signed)
Antony Contras, would you like me to speak with patient? Let me know. Thanks!

## 2015-04-04 NOTE — Telephone Encounter (Signed)
Also spoke with Antony Contras about interaction with Zpak and citalopram causing QT prolongation and she reports that patient may take Zpak.

## 2015-04-06 ENCOUNTER — Other Ambulatory Visit: Payer: Self-pay

## 2015-04-06 DIAGNOSIS — J302 Other seasonal allergic rhinitis: Secondary | ICD-10-CM

## 2015-04-06 MED ORDER — MONTELUKAST SODIUM 10 MG PO TABS: 10.0000 mg | ORAL_TABLET | Freq: Every day | ORAL | Status: DC

## 2015-05-16 ENCOUNTER — Telehealth: Payer: Self-pay

## 2015-05-16 NOTE — Telephone Encounter (Signed)
She needs to come in for appt and EKG please. Thanks.

## 2015-05-16 NOTE — Telephone Encounter (Signed)
Dr. Maryruth BunKapur called concerned about patient after she mentioned to her that she has been having chest pains. She reports that patient says that she has had symptoms for about 1 week. She describes it as a dull ache that seems to come and go. She reports that the episodes are becoming more frequent. She reports that chest pain could be anxiety related. However, she advised patient that she should be evaluated through her PCP also.  I called patient to get more information, and she reports that she also has a cold sensation in her legs and feet. She denies any pain radiating to her back. Denies headache, dizziness, or shortness of breath. Patient reports that it has been going on for about 1 week, and her symptoms does not seem to be activity related. Is there any recommendations you like me to give patient? Please advise. Thanks!

## 2015-05-16 NOTE — Telephone Encounter (Signed)
Pt called back and scheduled appt for f/u and EKG for 05/17/15. Thanks TNP

## 2015-05-16 NOTE — Telephone Encounter (Signed)
Left message to call back  

## 2015-05-17 ENCOUNTER — Encounter: Payer: Self-pay | Admitting: Physician Assistant

## 2015-05-17 ENCOUNTER — Ambulatory Visit (INDEPENDENT_AMBULATORY_CARE_PROVIDER_SITE_OTHER): Payer: BLUE CROSS/BLUE SHIELD | Admitting: Physician Assistant

## 2015-05-17 VITALS — BP 150/80 | HR 79 | Temp 98.0°F | Resp 16 | Wt 252.2 lb

## 2015-05-17 DIAGNOSIS — R079 Chest pain, unspecified: Secondary | ICD-10-CM | POA: Diagnosis not present

## 2015-05-17 NOTE — Progress Notes (Signed)
Patient: Joanna Bishop Female    DOB: 07/08/1955   60 y.o.   MRN: [id number] Visit Date: 05/17/2015  Today's Provider: Margaretann LovelessJennifer M Birtie Fellman, PA-C   Chief Complaint  Patient presents with  . Chest Pain   Subjective:    Chest Pain  This is a new problem. The current episode started 1 to 4 weeks ago. The onset quality is gradual. The problem occurs intermittently (it comes and goes but becoming more frequently). The pain is present in the substernal region. The pain is at a severity of 8/10 (at the worst, most of the time is 3/10). The quality of the pain is described as dull, crushing and tightness. The pain radiates to the left jaw and mid back. Associated symptoms include back pain, dizziness (sometimes), exertional chest pressure (sometimes), leg pain (on both legs with cold sensation), lower extremity edema (ankles and feet), malaise/fatigue, numbness (Left arm), shortness of breath and weakness. Pertinent negatives include no abdominal pain, cough, fever, headaches, irregular heartbeat, nausea, near-syncope, palpitations, syncope or vomiting. The pain is aggravated by walking. She has tried rest (Aleve and elevating her legs) for the symptoms. The treatment provided no relief.       Allergies  Allergen Reactions  . Penicillins    Previous Medications   ALBUTEROL (PROVENTIL HFA;VENTOLIN HFA) 108 (90 BASE) MCG/ACT INHALER    Inhale 2 puffs into the lungs every 4 (four) hours as needed for wheezing or shortness of breath. Every 4-6 hours as needed   ASPIRIN 81 MG TABLET    Take by mouth daily.   AZITHROMYCIN (ZITHROMAX) 250 MG TABLET    Take 2 tablets PO on day one, and one tablet PO daily thereafter until completed.   CALCIUM-MAGNESIUM-VITAMIN D (CALCIUM 500 PO)    Take 1 tablet by mouth 2 (two) times daily.   DIVALPROEX (DEPAKOTE ER) 250 MG 24 HR TABLET    Take 250 mg by mouth at bedtime. Reported on 05/17/2015   DOXYLAMINE, SLEEP, (UNISOM) 25 MG TABLET    Take 1 tablet (25 mg  total) by mouth at bedtime as needed.   FLUTICASONE (FLONASE) 50 MCG/ACT NASAL SPRAY    Place 2 sprays into the nose daily.   LURASIDONE (LATUDA) 40 MG TABS TABLET    Take 40 mg by mouth daily with breakfast. Reported on 05/17/2015   MONTELUKAST (SINGULAIR) 10 MG TABLET    Take 1 tablet (10 mg total) by mouth daily.   MULTIPLE VITAMIN PO    Take 1 tablet by mouth daily.   PREDNISONE (DELTASONE) 10 MG TABLET    Take 6 tabs PO on day 1&2, 5 tabs PO on day 3&4, 4 tabs PO on day 5&6, 3 tabs PO on day 7&8, 2 tabs PO on day 9&10, 1 tab PO on day 11&12.   UMECLIDINIUM BROMIDE (INCRUSE ELLIPTA) 62.5 MCG/INH AEPB    Inhale 1 puff into the lungs daily.    Review of Systems  Constitutional: Positive for malaise/fatigue. Negative for fever and chills.  Respiratory: Positive for shortness of breath. Negative for cough.   Cardiovascular: Positive for chest pain. Negative for palpitations, syncope and near-syncope.  Gastrointestinal: Negative for nausea, vomiting and abdominal pain.  Musculoskeletal: Positive for back pain.  Neurological: Positive for dizziness (sometimes), weakness and numbness (Left arm). Negative for headaches.    Social History  Substance Use Topics  . Smoking status: Never Smoker   . Smokeless tobacco: Never Used  . Alcohol Use: No  Objective:   BP 150/80 mmHg  Pulse 79  Temp(Src) 98 F (36.7 C) (Oral)  Resp 16  Wt 252 lb 3.2 oz (114.397 kg)  Physical Exam  Constitutional: She appears well-developed and well-nourished. No distress.  Neck: Normal range of motion. Neck supple. No JVD present. No tracheal deviation present. No thyromegaly present.  Cardiovascular: Normal rate, regular rhythm and normal heart sounds.  Exam reveals no gallop and no friction rub.   No murmur heard. Pulmonary/Chest: Effort normal and breath sounds normal. No respiratory distress. She has no wheezes. She has no rales.  Musculoskeletal: She exhibits no edema (no more than normal ).    Lymphadenopathy:    She has no cervical adenopathy.  Skin: She is not diaphoretic.  Vitals reviewed.       Assessment & Plan:     1. Chest pain, unspecified chest pain type EKG was normal with exception of left axis deviation. BP was elevated today in the office, but yesterday her BP was WNL at Dr. Shelda Altes office. I did advise her to check her BP at home 1st thing in the morning and call with results. It is likely this may be anxiety as she is under a lot of stress with her husband's ALS diagnosis. She is overweight, h/o hypercholesterolemia and borderline diabetic.  I do feel she needs a cardiac stress test to further evaluate and R/O cardiac involvement however.  I will see her back in 4 weeks to discuss her cardiology appointment. She is to call the office if symptoms worsen.  - EKG 12-Lead - Ambulatory referral to Cardiology       Margaretann Loveless, PA-C  [facility]

## 2015-05-17 NOTE — Patient Instructions (Signed)
Nonspecific Chest Pain  °Chest pain can be caused by many different conditions. There is always a chance that your pain could be related to something serious, such as a heart attack or a blood clot in your lungs. Chest pain can also be caused by conditions that are not life-threatening. If you have chest pain, it is very important to follow up with your health care provider. °CAUSES  °Chest pain can be caused by: °· Heartburn. °· Pneumonia or bronchitis. °· Anxiety or stress. °· Inflammation around your heart (pericarditis) or lung (pleuritis or pleurisy). °· A blood clot in your lung. °· A collapsed lung (pneumothorax). It can develop suddenly on its own (spontaneous pneumothorax) or from trauma to the chest. °· Shingles infection (varicella-zoster virus). °· Heart attack. °· Damage to the bones, muscles, and cartilage that make up your chest wall. This can include: °¨ Bruised bones due to injury. °¨ Strained muscles or cartilage due to frequent or repeated coughing or overwork. °¨ Fracture to one or more ribs. °¨ Sore cartilage due to inflammation (costochondritis). °RISK FACTORS  °Risk factors for chest pain may include: °· Activities that increase your risk for trauma or injury to your chest. °· Respiratory infections or conditions that cause frequent coughing. °· Medical conditions or overeating that can cause heartburn. °· Heart disease or family history of heart disease. °· Conditions or health behaviors that increase your risk of developing a blood clot. °· Having had chicken pox (varicella zoster). °SIGNS AND SYMPTOMS °Chest pain can feel like: °· Burning or tingling on the surface of your chest or deep in your chest. °· Crushing, pressure, aching, or squeezing pain. °· Dull or sharp pain that is worse when you move, cough, or take a deep breath. °· Pain that is also felt in your back, neck, shoulder, or arm, or pain that spreads to any of these areas. °Your chest pain may come and go, or it may stay  constant. °DIAGNOSIS °Lab tests or other studies may be needed to find the cause of your pain. Your health care provider may have you take a test called an ambulatory ECG (electrocardiogram). An ECG records your heartbeat patterns at the time the test is performed. You may also have other tests, such as: °· Transthoracic echocardiogram (TTE). During echocardiography, sound waves are used to create a picture of all of the heart structures and to look at how blood flows through your heart. °· Transesophageal echocardiogram (TEE). This is a more advanced imaging test that obtains images from inside your body. It allows your health care provider to see your heart in finer detail. °· Cardiac monitoring. This allows your health care provider to monitor your heart rate and rhythm in real time. °· Holter monitor. This is a portable device that records your heartbeat and can help to diagnose abnormal heartbeats. It allows your health care provider to track your heart activity for several days, if needed. °· Stress tests. These can be done through exercise or by taking medicine that makes your heart beat more quickly. °· Blood tests. °· Imaging tests. °TREATMENT  °Your treatment depends on what is causing your chest pain. Treatment may include: °· Medicines. These may include: °¨ Acid blockers for heartburn. °¨ Anti-inflammatory medicine. °¨ Pain medicine for inflammatory conditions. °¨ Antibiotic medicine, if an infection is present. °¨ Medicines to dissolve blood clots. °¨ Medicines to treat coronary artery disease. °· Supportive care for conditions that do not require medicines. This may include: °¨ Resting. °¨ Applying heat   or cold packs to injured areas. °¨ Limiting activities until pain decreases. °HOME CARE INSTRUCTIONS °· If you were prescribed an antibiotic medicine, finish it all even if you start to feel better. °· Avoid any activities that bring on chest pain. °· Do not use any tobacco products, including  cigarettes, chewing tobacco, or electronic cigarettes. If you need help quitting, ask your health care provider. °· Do not drink alcohol. °· Take medicines only as directed by your health care provider. °· Keep all follow-up visits as directed by your health care provider. This is important. This includes any further testing if your chest pain does not go away. °· If heartburn is the cause for your chest pain, you may be told to keep your head raised (elevated) while sleeping. This reduces the chance that acid will go from your stomach into your esophagus. °· Make lifestyle changes as directed by your health care provider. These may include: °¨ Getting regular exercise. Ask your health care provider to suggest some activities that are safe for you. °¨ Eating a heart-healthy diet. A registered dietitian can help you to learn healthy eating options. °¨ Maintaining a healthy weight. °¨ Managing diabetes, if necessary. °¨ Reducing stress. °SEEK MEDICAL CARE IF: °· Your chest pain does not go away after treatment. °· You have a rash with blisters on your chest. °· You have a fever. °SEEK IMMEDIATE MEDICAL CARE IF:  °· Your chest pain is worse. °· You have an increasing cough, or you cough up blood. °· You have severe abdominal pain. °· You have severe weakness. °· You faint. °· You have chills. °· You have sudden, unexplained chest discomfort. °· You have sudden, unexplained discomfort in your arms, back, neck, or jaw. °· You have shortness of breath at any time. °· You suddenly start to sweat, or your skin gets clammy. °· You feel nauseous or you vomit. °· You suddenly feel light-headed or dizzy. °· Your heart begins to beat quickly, or it feels like it is skipping beats. °These symptoms may represent a serious problem that is an emergency. Do not wait to see if the symptoms will go away. Get medical help right away. Call your local emergency services (911 in the [place] Do not drive yourself to the hospital. °  °This  information is not intended to replace advice given to you by your health care provider. Make sure you discuss any questions you have with your health care provider. °  °Document Released: 11/22/2004 Document Revised: 03/05/2014 Document Reviewed: 09/18/2013 °Elsevier Interactive Patient Education ©2016 [employer] ° °

## 2015-05-30 ENCOUNTER — Ambulatory Visit: Payer: BLUE CROSS/BLUE SHIELD | Admitting: Physician Assistant

## 2015-06-14 ENCOUNTER — Ambulatory Visit (INDEPENDENT_AMBULATORY_CARE_PROVIDER_SITE_OTHER): Payer: BLUE CROSS/BLUE SHIELD | Admitting: Physician Assistant

## 2015-06-14 ENCOUNTER — Encounter: Payer: Self-pay | Admitting: Physician Assistant

## 2015-06-14 VITALS — BP 138/88 | HR 94 | Temp 97.8°F | Resp 16 | Wt 247.6 lb

## 2015-06-14 DIAGNOSIS — M1812 Unilateral primary osteoarthritis of first carpometacarpal joint, left hand: Secondary | ICD-10-CM | POA: Diagnosis not present

## 2015-06-14 DIAGNOSIS — J4540 Moderate persistent asthma, uncomplicated: Secondary | ICD-10-CM

## 2015-06-14 DIAGNOSIS — M7522 Bicipital tendinitis, left shoulder: Secondary | ICD-10-CM

## 2015-06-14 NOTE — Progress Notes (Signed)
Patient: Joanna Bishop Female    DOB: 03/13/1955   60 y.o.   MRN: [id number] Visit Date: 06/14/2015  Today's Provider: Margaretann LovelessJennifer M Burnette, PA-C   Chief Complaint  Patient presents with  . Follow-up    Cardiology appointment  . Asthma   Subjective:    HPIPatient is here to follow up on her Cardiology appointment for atypical chest pain. Per patient went to the Cardiologist last week and she is scheduled for May 5 for a stress test. It is felt that the chest pain may have been associated with her anxiety secondary to her the deterioration of her husband's health (he has ALS), but due to overweight, sedentary lifestyle and family history it was felt it best to R/O cardiac involvement. She states she has not had any recent episodes of the chest pain like she had before.   Asthma: Joanna Cablesamela S Deskins also presents for evaluation of asthma. Patient's symptoms include chest tightness, non-productive cough and wheezing and SOB. Associated symptoms include sneezing and headache (squeezing).The patient has been suffering from these symptoms for approximately 3 days. Symptoms have been gradually worsening since their onset. Medications used in the past to treat these symptoms include ipratropium inhalers and oral steroids (albuterol inhaler prn). She states she has increased her albuterol inhaler use to twice daily due to recent exacerbations. She also continues to use her Incruse inhaler daily. Patient is awoken from sleep approximately 2 times per night (not due to asthma, but because of her husband). Patient has not required Emergency Room treatment for these symptoms, and has not required hospitalization.  Bipolar/Depression: She is also doing better with her bipolar-depression episode currently. She is taking Geodon and depakote currently. Kasandra KnudsenLatuda was discontinued due to cost. She is followed by Dr. Maryruth BunKapur and follows up with her next month. She has not started work back currently because she is  awaiting HH to be set up for her husband at home as he cannot be left for long periods be himself and the fact that [employer] [facility] [facility] to come back at 4 days per week for 10 hours instead of part-time like Dr. Maryruth BunKapur had requested of starting back at 5 hour days.     Allergies  Allergen Reactions  . Penicillins    Previous Medications   ALBUTEROL (PROVENTIL HFA;VENTOLIN HFA) 108 (90 BASE) MCG/ACT INHALER    Inhale 2 puffs into the lungs every 4 (four) hours as needed for wheezing or shortness of breath. Every 4-6 hours as needed   ASPIRIN 81 MG TABLET    Take by mouth daily.   AZITHROMYCIN (ZITHROMAX) 250 MG TABLET    Take 2 tablets PO on day one, and one tablet PO daily thereafter until completed.   CALCIUM-MAGNESIUM-VITAMIN D (CALCIUM 500 PO)    Take 1 tablet by mouth 2 (two) times daily.   CITALOPRAM (CELEXA) 20 MG TABLET    Take by mouth.   DIVALPROEX (DEPAKOTE ER) 250 MG 24 HR TABLET    Take 250 mg by mouth at bedtime. Reported on 05/17/2015   DOXYLAMINE, SLEEP, (UNISOM) 25 MG TABLET    Take 1 tablet (25 mg total) by mouth at bedtime as needed.   FLUTICASONE (FLONASE) 50 MCG/ACT NASAL SPRAY    Place 2 sprays into the nose daily.   LURASIDONE (LATUDA) 40 MG TABS TABLET    Take 40 mg by mouth daily with breakfast. Reported on 06/14/2015   MONTELUKAST (SINGULAIR) 10 MG TABLET  Take 1 tablet (10 mg total) by mouth daily.   MULTIPLE VITAMIN PO    Take 1 tablet by mouth daily.   PREDNISONE (DELTASONE) 10 MG TABLET    Take 6 tabs PO on day 1&2, 5 tabs PO on day 3&4, 4 tabs PO on day 5&6, 3 tabs PO on day 7&8, 2 tabs PO on day 9&10, 1 tab PO on day 11&12.   UMECLIDINIUM BROMIDE (INCRUSE ELLIPTA) 62.5 MCG/INH AEPB    Inhale 1 puff into the lungs daily.   ZIPRASIDONE (GEODON) 60 MG CAPSULE    Take by mouth.    Review of Systems  Constitutional: Positive for fatigue. Negative for fever and chills.  HENT: Positive for sneezing. Negative for postnasal drip, rhinorrhea and sore throat.     Respiratory: Positive for cough, chest tightness, shortness of breath and wheezing.   Cardiovascular: Positive for chest pain (is seeing the Cardiologist ). Negative for palpitations and leg swelling.  Gastrointestinal: Negative for nausea, vomiting and abdominal pain.  Musculoskeletal: Positive for joint swelling (left wrist and left knee) and arthralgias (left shoulder, left knee and left wrist). Negative for back pain, gait problem, neck pain and neck stiffness.  Neurological: Positive for headaches.  Psychiatric/Behavioral: Positive for sleep disturbance (Sleeps approx 3 hours at a time because husband wakes her up during the night). Negative for dysphoric mood (improving) and decreased concentration. The patient is nervous/anxious (occasionally but improving).     Social History  Substance Use Topics  . Smoking status: Never Smoker   . Smokeless tobacco: Never Used  . Alcohol Use: No   Objective:   BP 138/88 mmHg  Pulse 94  Temp(Src) 97.8 F (36.6 C) (Oral)  Resp 16  Wt 247 lb 9.6 oz (112.311 kg)  SpO2 96%  Physical Exam  Constitutional: She appears well-developed and well-nourished. No distress.  HENT:  Head: Normocephalic and atraumatic.  Right Ear: Hearing, external ear and ear canal normal. Tympanic membrane is not erythematous and not bulging. A middle ear effusion is present.  Left Ear: Hearing, external ear and ear canal normal. Tympanic membrane is not erythematous and not bulging. A middle ear effusion is present.  Nose: Mucosal edema present. No rhinorrhea. Right sinus exhibits no maxillary sinus tenderness and no frontal sinus tenderness. Left sinus exhibits no maxillary sinus tenderness and no frontal sinus tenderness.  Mouth/Throat: Uvula is midline, oropharynx is clear and moist and mucous membranes are normal. No oropharyngeal exudate, posterior oropharyngeal edema or posterior oropharyngeal erythema.  Eyes: Conjunctivae are normal. Pupils are equal, round, and  reactive to light. Right eye exhibits no discharge. Left eye exhibits no discharge. No scleral icterus.  Neck: Normal range of motion. Neck supple. No tracheal deviation present. No thyromegaly present.  Cardiovascular: Normal rate, regular rhythm and normal heart sounds.  Exam reveals no gallop and no friction rub.   No murmur heard. Pulmonary/Chest: Effort normal and breath sounds normal. No stridor. No respiratory distress. She has no wheezes. She has no rales.  Currently no wheezing heard  Musculoskeletal:       Left shoulder: She exhibits decreased range of motion (decreased abdustion) and tenderness (pain over supraspinatus muscle and bicpes tendon). She exhibits no bony tenderness, no swelling, no effusion, no crepitus, no deformity, no laceration, no pain, no spasm, normal pulse and normal strength.       Right wrist: Normal.       Left wrist: She exhibits tenderness (tender over 1st MC-carpal joint), bony tenderness and swelling (very  mild). She exhibits normal range of motion, no effusion, no crepitus, no deformity and no laceration.       Left knee: She exhibits swelling. She exhibits normal range of motion, no effusion, no ecchymosis, no deformity, no laceration, no erythema, normal alignment, no LCL laxity, normal patellar mobility, no bony tenderness, normal meniscus and no MCL laxity. Tenderness found. Medial joint line (known OA;has not worsened but no improvement) and lateral joint line tenderness noted. No MCL, no LCL and no patellar tendon tenderness noted.  Lymphadenopathy:    She has no cervical adenopathy.  Skin: Skin is warm and dry. She is not diaphoretic.  Vitals reviewed.       Assessment & Plan:     1. AB (asthmatic bronchitis), moderate persistent, uncomplicated Currently stable with Incruse and albuterol rescue inhaler. She does have a history of getting acute exacerbations of chronic asthmatic bronchitis during allergy seasons requiring an antibiotic and oral  steroid taper. She is to call the office if she develops a fever, increasing productive or non-productive cough, increasing SOB or wheezing.  2. Primary osteoarthritis of first carpometacarpal joint of left hand Discussed conservative options including continuing aleve 220 mg 2 tabs BID with food and using a wrist brace for support.  She may ice the area as well. Discussed adding physical therapy for relief and to be taught appropriate lifting techniques for lifting her husband, as I feel these issues are developing from overuse of lifting him inappropriately. She does not wish to have PT at this time but will call if symptoms persist or worsen over the next two weeks. If it improves I will see her back in 3 months for her CPE.   3. Biceps tendinitis on left As stated above will continue conservative therapy with aleve, heating/icing as needed/ and she has a TENs unit for the shoulder. Exercises and stretches handout were given to the patient. She is to call if no improvement in 2 weeks for consideration of PT vs orthopedic referral. If symptoms improve I will see her back in 3 months for CPE.       Margaretann Loveless, PA-C  [facility]

## 2015-06-14 NOTE — Patient Instructions (Signed)
Osteoarthritis Osteoarthritis is a disease that causes soreness and inflammation of a joint. It occurs when the cartilage at the affected joint wears down. Cartilage acts as a cushion, covering the ends of bones where they meet to form a joint. Osteoarthritis is the most common form of arthritis. It often occurs in older people. The joints affected most often by this condition include those in the:  Ends of the fingers.  Thumbs.  Neck.  Lower back.  Knees.  Hips. CAUSES  Over time, the cartilage that covers the ends of bones begins to wear away. This causes bone to rub on bone, producing pain and stiffness in the affected joints.  RISK FACTORS Certain factors can increase your chances of having osteoarthritis, including:  Older age.  Excessive body weight.  Overuse of joints.  Previous joint injury. SIGNS AND SYMPTOMS   Pain, swelling, and stiffness in the joint.  Over time, the joint may lose its normal shape.  Small deposits of bone (osteophytes) may grow on the edges of the joint.  Bits of bone or cartilage can break off and float inside the joint space. This may cause more pain and damage. DIAGNOSIS  Your health care provider will do a physical exam and ask about your symptoms. Various tests may be ordered, such as:  X-rays of the affected joint.  Blood tests to rule out other types of arthritis. Additional tests may be used to diagnose your condition. TREATMENT  Goals of treatment are to control pain and improve joint function. Treatment plans may include:  A prescribed exercise program that allows for rest and joint relief.  A weight control plan.  Pain relief techniques, such as:  Properly applied heat and cold.  Electric pulses delivered to nerve endings under the skin (transcutaneous electrical nerve stimulation [TENS]).  Massage.  Certain nutritional supplements.  Medicines to control pain, such as:  Acetaminophen.  Nonsteroidal  anti-inflammatory drugs (NSAIDs), such as naproxen.  Narcotic or central-acting agents, such as tramadol.  Corticosteroids. These can be given orally or as an injection.  Surgery to reposition the bones and relieve pain (osteotomy) or to remove loose pieces of bone and cartilage. Joint replacement may be needed in advanced states of osteoarthritis. HOME CARE INSTRUCTIONS   Take medicines only as directed by your health care provider.  Maintain a healthy weight. Follow your health care provider's instructions for weight control. This may include dietary instructions.  Exercise as directed. Your health care provider can recommend specific types of exercise. These may include:  Strengthening exercises. These are done to strengthen the muscles that support joints affected by arthritis. They can be performed with weights or with exercise bands to add resistance.  Aerobic activities. These are exercises, such as brisk walking or low-impact aerobics, that get your heart pumping.  Range-of-motion activities. These keep your joints limber.  Balance and agility exercises. These help you maintain daily living skills.  Rest your affected joints as directed by your health care provider.  Keep all follow-up visits as directed by your health care provider. SEEK MEDICAL CARE IF:   Your skin turns red.  You develop a rash in addition to your joint pain.  You have worsening joint pain.  You have a fever along with joint or muscle aches. SEEK IMMEDIATE MEDICAL CARE IF:  You have a significant loss of weight or appetite.  You have night sweats. FOR MORE INFORMATION   National Institute of Arthritis and Musculoskeletal and Skin Diseases: www.niams.nih.gov  National Institute on   Aging: https://walker.com/  American College of Rheumatology: www.rheumatology.org   This information is not intended to replace advice given to you by your health care provider. Make sure you discuss any questions you  have with your health care provider.   Document Released: 02/12/2005 Document Revised: 03/05/2014 Document Reviewed: 10/20/2012 Elsevier Interactive Patient Education 2016 Elsevier Inc.  Biceps Tendon Tendinitis (Distal) With Rehab Tendinitis involves inflammation and pain over the affected tendon. The distal biceps tendon (near the elbow) is vulnerable to tendinitis. Distal biceps tendonitis is usually due to the bony bump near the elbow (bicipital tuberosity) causing increased friction over the tendon. The biceps tendon attaches the biceps muscle to one bone in the elbow and two in the shoulder. It is important for proper function of the elbow and for turning the palm upward (supination). SYMPTOMS   Pain, aching, tenderness, and sometimes warmth or redness over the front of the elbow.  Pain when bending the elbow or turning the palm up, using the wrist, especially if performed against resistance.  Crackling sound (crepitation) when the tendon or elbow is moved or touched. CAUSES  The symptoms of biceps tendonitis are due to inflammation of the tendon. Inflammation may be caused by:  Strain from sudden increase in amount or intensity of activity.  Direct blow or injury to the elbow (uncommon).  Overuse or repetitive elbow bending or wrist rotation, particularly when turning the palm up, or with elbow hyperextension. RISK INCREASES WITH:  Sports that involve contact or overhead arm activity (throwing sports, gymnastics, weightlifting, bodybuilding, rock climbing).  Heavy labor.  Poor strength and flexibility.  Failure to warm up properly before activity.  Injury to other structures of the elbow.  Restraint of the elbow. PREVENTION  Warm up and stretch properly before activity.  Allow time for recovery between activities.  Maintain physical fitness:  Strength, flexibility, and endurance.  Cardiovascular fitness.  Learn and use proper exercise technique. PROGNOSIS    With proper treatment, biceps tendon tendonitis is usually curable within 6 weeks.  RELATED COMPLICATIONS   Longer healing time if not properly treated or if not given enough time to heal.  Chronically inflamed tendon that causes persistent pain with activity, that may progress to constant pain and potentially rupture of the tendon.  Recurring symptoms, especially if activity is resumed too soon, with overuse or with poor technique. TREATMENT  Treatment first involves ice and medicine to reduce pain and inflammation. Modify activities that cause pain, to reduce the chances of causing the condition to get worse. Strengthening and stretching exercises should be performed to promote proper use of the muscles of the elbow. These exercise may be performed at home or with a therapist. Other treatments may be given such as ultrasound or heat therapy. Surgery is usually not recommended.  MEDICATION  If pain medicine is needed, nonsteroidal anti-inflammatory medicines (aspirin and ibuprofen), or other minor pain relievers (acetaminophen), are often advised.  Do not take pain relieving medication for 7 days before surgery.  Prescription pain relievers may be given if your caregiver thinks they are needed. Use only as directed and only as much as you need. HEAT AND COLD:   Cold treatment (icing) should be applied for 10 to 15 minutes every 2 to 3 hours for inflammation and pain, and immediately after activity that aggravates your symptoms. Use ice packs or an ice massage.  Heat treatment may be used before performing stretching and strengthening activities prescribed by your caregiver, physical therapist, or athletic trainer. Use a  heat pack or a warm water soak. SEEK MEDICAL CARE IF:   Symptoms get worse or do not improve in 2 weeks, despite treatment.  New, unexplained symptoms develop. (Drugs used in treatment may produce side effects.) EXERCISES  RANGE OF MOTION (ROM) AND STRETCHING EXERCISES  - Biceps Tendon Tendinitis (Distal) These exercises may help you when beginning to rehabilitate your injury. Your symptoms may go away with or without further involvement from your physician, physical therapist, or athletic trainer. While completing these exercises, remember:   Restoring tissue flexibility helps normal motion to return to the joints. This allows healthier, less painful movement and activity.  An effective stretch should be held for at least 30 seconds.  A stretch should never be painful. You should only feel a gentle lengthening or release in the stretched tissue. STRETCH - Elbow Flexors   Lie on a firm bed or countertop on your back. Be sure that you are in a comfortable position which will allow you to relax your arm muscles.  Place a folded towel under your right / left upper arm, so that your elbow and shoulder are at the same height. Extend your arm; your elbow should not rest on the bed or towel.  Allow the weight of your hand to straighten your elbow. Keep your arm and chest muscles relaxed. Your caretaker may ask you to increase the intensity of your stretch by adding a small wrist or hand weight.  Hold for __________ seconds. You should feel a stretch on the inside of your elbow. Slowly return to the starting position. Repeat __________ times. Complete this exercise __________ times per day. RANGE OF MOTION - Supination, Active   Stand or sit with your elbows at your side. Bend your right / left elbow to 90 degrees.  Turn your palm upward until you feel a gentle stretch on the inside of your forearm.  Hold this position for __________ seconds. Slowly release and return to the starting position. Repeat __________ times. Complete this stretch __________ times per day.  RANGE OF MOTION - Pronation, Active   Stand or sit with your elbows at your side. Bend your right / left elbow to 90 degrees.  Turn your palm downward until you feel a gentle stretch on the top of  your forearm.  Hold this position for __________ seconds. Slowly release and return to the starting position. Repeat __________ times. Complete this stretch __________ times per day.  STRENGTHENING EXERCISES - Biceps Tendon Tendinitis (Distal) These exercises may help you when beginning to rehabilitate your injury. They may resolve your symptoms with or without further involvement from your physician, physical therapist or athletic trainer. While completing these exercises, remember:   Muscles can gain both the endurance and the strength needed for everyday activities through controlled exercises.  Complete these exercises as instructed by your physician, physical therapist or athletic trainer. Increase the resistance and repetitions only as guided.  You may experience muscle soreness or fatigue, but the pain or discomfort you are trying to eliminate should never get worse during these exercises. If this pain does get worse, stop and make sure you are following the directions exactly. If the pain is still present after adjustments, discontinue the exercise until you can discuss the trouble with your clinician. STRENGTH - Elbow Flexors, Isometric   Stand or sit upright on a firm surface. Place your right / left arm so that your hand is palm-up and at the height of your waist.  Place your opposite hand  on top of your forearm. Gently push down as your right / left arm resists. Push as hard as you can with both arms without causing any pain or movement at your right / left elbow. Hold this stationary position for __________ seconds.  Gradually release the tension in both arms. Allow your muscles to relax completely before repeating. Repeat __________ times. Complete this exercise __________ times per day. STRENGTH - Forearm Supinators   Sit with your right / left forearm supported on a table, keeping your elbow below shoulder height. Rest your hand over the edge, palm down.  Gently grip a hammer  or a soup ladle.  Without moving your elbow, slowly turn your palm and hand upward to a "thumbs-up" position.  Hold this position for __________ seconds. Slowly return to the starting position. Repeat __________ times. Complete this exercise __________ times per day.  STRENGTH - Forearm Pronators   Sit with your right / left forearm supported on a table, keeping your elbow below shoulder height. Rest your hand over the edge, palm up.  Gently grip a hammer or a soup ladle.  Without moving your elbow, slowly turn your palm and hand upward to a "thumbs-up" position.  Hold this position for __________ seconds. Slowly return to the starting position. Repeat __________ times. Complete this exercise __________ times per day.  STRENGTH - Elbow Flexors, Supinated  With good posture, stand or sit on a firm chair without armrests. Allow your right / left arm to rest at your side with your palm facing forward.  Holding a __________ weight, or gripping a rubber exercise band or tubing, bring your hand toward your shoulder.  Allow your muscles to control the resistance as your hand returns to your side. Repeat __________ times. Complete this exercise __________ times per day.  STRENGTH - Elbow Flexors, Neutral  With good posture, stand or sit on a firm chair without armrests. Allow your right / left arm to rest at your side with your thumb facing forward.  Holding a __________ weight, or gripping a rubber exercise band or tubing, bring your hand toward your shoulder.  Allow your muscles to control the resistance as your hand returns to your side. Repeat __________ times. Complete this exercise __________ times per day.    This information is not intended to replace advice given to you by your health care provider. Make sure you discuss any questions you have with your health care provider.   Document Released: 02/12/2005 Document Revised: 03/05/2014 Document Reviewed: 05/27/2008 Elsevier  Interactive Patient Education [employer]2016 Elsevier Inc.

## 2015-06-15 ENCOUNTER — Ambulatory Visit: Payer: BLUE CROSS/BLUE SHIELD | Admitting: Physician Assistant

## 2015-07-07 ENCOUNTER — Telehealth: Payer: Self-pay | Admitting: Physician Assistant

## 2015-07-07 DIAGNOSIS — J40 Bronchitis, not specified as acute or chronic: Secondary | ICD-10-CM

## 2015-07-07 MED ORDER — AZITHROMYCIN 250 MG PO TABS: ORAL_TABLET | ORAL | Status: DC

## 2015-07-07 MED ORDER — PREDNISONE 10 MG PO TABS: ORAL_TABLET | ORAL | Status: DC

## 2015-07-07 NOTE — Telephone Encounter (Signed)
Please advise.  Thanks,  -Joseline 

## 2015-07-07 NOTE — Telephone Encounter (Signed)
[facility] [address]

## 2015-07-07 NOTE — Telephone Encounter (Signed)
Pt called saying her cough has gone into bronchitis.  She uses [facility] [address]  Pt's call back is [phone]   She said you told her to call if this happened.  Was in two weeks ago.  Thanks, Fortune Brandsteri

## 2015-07-12 ENCOUNTER — Inpatient Hospital Stay
Admission: EM | Admit: 2015-07-12 | Discharge: 2015-07-15 | DRG: 885 | Disposition: A | Discharge status: Home / Self Care | Payer: Medicaid Other | Source: Intra-hospital | Attending: Psychiatry | Admitting: Psychiatry

## 2015-07-12 ENCOUNTER — Emergency Department
Admission: EM | Admit: 2015-07-12 | Discharge: 2015-07-12 | Disposition: A | Discharge status: Other Acute Inpatient Hospital | Payer: Medicaid Other | Attending: Student | Admitting: Student

## 2015-07-12 ENCOUNTER — Encounter: Payer: Self-pay | Admitting: *Deleted

## 2015-07-12 ENCOUNTER — Encounter: Payer: Self-pay | Admitting: Emergency Medicine

## 2015-07-12 DIAGNOSIS — F314 Bipolar disorder, current episode depressed, severe, without psychotic features: Secondary | ICD-10-CM

## 2015-07-12 DIAGNOSIS — F319 Bipolar disorder, unspecified: Principal | ICD-10-CM | POA: Diagnosis present

## 2015-07-12 DIAGNOSIS — Z9049 Acquired absence of other specified parts of digestive tract: Secondary | ICD-10-CM

## 2015-07-12 DIAGNOSIS — J449 Chronic obstructive pulmonary disease, unspecified: Secondary | ICD-10-CM | POA: Diagnosis present

## 2015-07-12 DIAGNOSIS — E559 Vitamin D deficiency, unspecified: Secondary | ICD-10-CM | POA: Diagnosis present

## 2015-07-12 DIAGNOSIS — Z7951 Long term (current) use of inhaled steroids: Secondary | ICD-10-CM | POA: Insufficient documentation

## 2015-07-12 DIAGNOSIS — Z880 Allergy status to penicillin: Secondary | ICD-10-CM | POA: Diagnosis not present

## 2015-07-12 DIAGNOSIS — R45851 Suicidal ideations: Secondary | ICD-10-CM | POA: Diagnosis present

## 2015-07-12 DIAGNOSIS — E663 Overweight: Secondary | ICD-10-CM | POA: Diagnosis present

## 2015-07-12 DIAGNOSIS — Z825 Family history of asthma and other chronic lower respiratory diseases: Secondary | ICD-10-CM

## 2015-07-12 DIAGNOSIS — R7303 Prediabetes: Secondary | ICD-10-CM | POA: Diagnosis present

## 2015-07-12 DIAGNOSIS — E1169 Type 2 diabetes mellitus with other specified complication: Secondary | ICD-10-CM | POA: Diagnosis present

## 2015-07-12 DIAGNOSIS — Z90710 Acquired absence of both cervix and uterus: Secondary | ICD-10-CM | POA: Diagnosis not present

## 2015-07-12 DIAGNOSIS — Z8659 Personal history of other mental and behavioral disorders: Secondary | ICD-10-CM

## 2015-07-12 DIAGNOSIS — Z79899 Other long term (current) drug therapy: Secondary | ICD-10-CM | POA: Diagnosis not present

## 2015-07-12 DIAGNOSIS — Z8249 Family history of ischemic heart disease and other diseases of the circulatory system: Secondary | ICD-10-CM

## 2015-07-12 DIAGNOSIS — Z7982 Long term (current) use of aspirin: Secondary | ICD-10-CM | POA: Insufficient documentation

## 2015-07-12 DIAGNOSIS — F332 Major depressive disorder, recurrent severe without psychotic features: Secondary | ICD-10-CM | POA: Insufficient documentation

## 2015-07-12 DIAGNOSIS — G43909 Migraine, unspecified, not intractable, without status migrainosus: Secondary | ICD-10-CM | POA: Diagnosis present

## 2015-07-12 DIAGNOSIS — Z915 Personal history of self-harm: Secondary | ICD-10-CM | POA: Diagnosis not present

## 2015-07-12 DIAGNOSIS — Z599 Problem related to housing and economic circumstances, unspecified: Secondary | ICD-10-CM | POA: Diagnosis not present

## 2015-07-12 DIAGNOSIS — Z833 Family history of diabetes mellitus: Secondary | ICD-10-CM

## 2015-07-12 DIAGNOSIS — F322 Major depressive disorder, single episode, severe without psychotic features: Secondary | ICD-10-CM

## 2015-07-12 DIAGNOSIS — Z96652 Presence of left artificial knee joint: Secondary | ICD-10-CM | POA: Diagnosis present

## 2015-07-12 DIAGNOSIS — J45909 Unspecified asthma, uncomplicated: Secondary | ICD-10-CM | POA: Diagnosis not present

## 2015-07-12 DIAGNOSIS — F4001 Agoraphobia with panic disorder: Secondary | ICD-10-CM | POA: Diagnosis present

## 2015-07-12 DIAGNOSIS — E7800 Pure hypercholesterolemia, unspecified: Secondary | ICD-10-CM | POA: Diagnosis present

## 2015-07-12 DIAGNOSIS — F3130 Bipolar disorder, current episode depressed, mild or moderate severity, unspecified: Secondary | ICD-10-CM | POA: Diagnosis not present

## 2015-07-12 DIAGNOSIS — G4700 Insomnia, unspecified: Secondary | ICD-10-CM | POA: Diagnosis present

## 2015-07-12 HISTORY — DX: Unspecified asthma, uncomplicated: J45.909

## 2015-07-12 LAB — CBC
HEMATOCRIT: 42.7 % (ref 35.0–47.0)
HEMOGLOBIN: 14.7 g/dL (ref 12.0–16.0)
MCH: 29.5 pg (ref 26.0–34.0)
MCHC: 34.4 g/dL (ref 32.0–36.0)
MCV: 85.7 fL (ref 80.0–100.0)
Platelets: 219 10*3/uL (ref 150–440)
RBC: 4.99 MIL/uL (ref 3.80–5.20)
RDW: 13.4 % (ref 11.5–14.5)
WBC: 8.2 10*3/uL (ref 3.6–11.0)

## 2015-07-12 LAB — URINE DRUG SCREEN, QUALITATIVE (ARMC ONLY)
AMPHETAMINES, UR SCREEN: NOT DETECTED
Barbiturates, Ur Screen: NOT DETECTED
Benzodiazepine, Ur Scrn: NOT DETECTED
COCAINE METABOLITE, UR ~~LOC~~: NOT DETECTED
Cannabinoid 50 Ng, Ur ~~LOC~~: NOT DETECTED
MDMA (ECSTASY) UR SCREEN: NOT DETECTED
METHADONE SCREEN, URINE: NOT DETECTED
OPIATE, UR SCREEN: NOT DETECTED
Phencyclidine (PCP) Ur S: NOT DETECTED
TRICYCLIC, UR SCREEN: NOT DETECTED

## 2015-07-12 LAB — COMPREHENSIVE METABOLIC PANEL
ALBUMIN: 4.4 g/dL (ref 3.5–5.0)
ALK PHOS: 82 U/L (ref 38–126)
ALT: 51 U/L (ref 14–54)
ANION GAP: 8 (ref 5–15)
AST: 35 U/L (ref 15–41)
BUN: 18 mg/dL (ref 6–20)
CHLORIDE: 109 mmol/L (ref 101–111)
CO2: 22 mmol/L (ref 22–32)
CREATININE: 0.91 mg/dL (ref 0.44–1.00)
Calcium: 9.4 mg/dL (ref 8.9–10.3)
GFR calc non Af Amer: >60 mL/min (ref >60)
GLUCOSE: 154 mg/dL — AB (ref 65–99)
Potassium: 4.3 mmol/L (ref 3.5–5.1)
SODIUM: 139 mmol/L (ref 135–145)
Total Bilirubin: 0.9 mg/dL (ref 0.3–1.2)
Total Protein: 7.4 g/dL (ref 6.5–8.1)

## 2015-07-12 LAB — LIPID PANEL
Cholesterol: 165 mg/dL (ref 0–200)
HDL: 36 mg/dL — AB (ref >40)
LDL CALC: 56 mg/dL (ref 0–99)
TRIGLYCERIDES: 367 mg/dL — AB (ref <150)
Total CHOL/HDL Ratio: 4.6 RATIO
VLDL: 73 mg/dL — ABNORMAL HIGH (ref 0–40)

## 2015-07-12 LAB — ACETAMINOPHEN LEVEL

## 2015-07-12 LAB — SALICYLATE LEVEL

## 2015-07-12 LAB — TSH: TSH: 3.03 u[IU]/mL (ref 0.350–4.500)

## 2015-07-12 LAB — ETHANOL: Alcohol, Ethyl (B): <5 mg/dL (ref <5)

## 2015-07-12 MED ORDER — ALBUTEROL SULFATE HFA 108 (90 BASE) MCG/ACT IN AERS
2.0000 | INHALATION_SPRAY | RESPIRATORY_TRACT | Status: DC | PRN
  Administered 2015-07-15: 2 via RESPIRATORY_TRACT
  Filled 2015-07-12: qty 6.7

## 2015-07-12 MED ORDER — UMECLIDINIUM BROMIDE 62.5 MCG/INH IN AEPB
1.0000 | INHALATION_SPRAY | Freq: Every day | RESPIRATORY_TRACT | Status: DC
  Administered 2015-07-13 – 2015-07-15 (×3): 1 via RESPIRATORY_TRACT
  Filled 2015-07-12: qty 7

## 2015-07-12 MED ORDER — MAGNESIUM HYDROXIDE 400 MG/5ML PO SUSP
30.0000 mL | Freq: Every day | ORAL | Status: DC | PRN
Start: 2015-07-12 — End: 2015-07-15

## 2015-07-12 MED ORDER — CITALOPRAM HYDROBROMIDE 20 MG PO TABS: 20.0000 mg | ORAL_TABLET | Freq: Every day | ORAL | Status: DC

## 2015-07-12 MED ORDER — MONTELUKAST SODIUM 10 MG PO TABS: 10.0000 mg | ORAL_TABLET | Freq: Every day | ORAL | Status: DC

## 2015-07-12 MED ORDER — FLUTICASONE PROPIONATE 50 MCG/ACT NA SUSP
2.0000 | Freq: Every day | NASAL | Status: DC
  Administered 2015-07-13 – 2015-07-15 (×3): 2 via NASAL
  Filled 2015-07-12: qty 16

## 2015-07-12 MED ORDER — ASPIRIN EC 81 MG PO TBEC
81.0000 mg | DELAYED_RELEASE_TABLET | Freq: Every day | ORAL | Status: DC
  Administered 2015-07-13 – 2015-07-15 (×3): 81 mg via ORAL
  Filled 2015-07-12 (×3): qty 1

## 2015-07-12 MED ORDER — ZIPRASIDONE HCL 60 MG PO CAPS
60.0000 mg | ORAL_CAPSULE | Freq: Every day | ORAL | Status: DC
  Administered 2015-07-12 (×2): 60 mg via ORAL

## 2015-07-12 MED ORDER — ASPIRIN EC 81 MG PO TBEC: 81.0000 mg | DELAYED_RELEASE_TABLET | Freq: Every day | ORAL | Status: DC

## 2015-07-12 MED ORDER — ACETAMINOPHEN 325 MG PO TABS
650.0000 mg | ORAL_TABLET | Freq: Four times a day (QID) | ORAL | Status: DC | PRN
  Administered 2015-07-14: 650 mg via ORAL
  Filled 2015-07-12 (×2): qty 2

## 2015-07-12 MED ORDER — CITALOPRAM HYDROBROMIDE 20 MG PO TABS
20.0000 mg | ORAL_TABLET | Freq: Every day | ORAL | Status: DC
  Administered 2015-07-13 – 2015-07-15 (×3): 20 mg via ORAL
  Filled 2015-07-12 (×3): qty 1

## 2015-07-12 MED ORDER — FLUTICASONE PROPIONATE 50 MCG/ACT NA SUSP: 2.0000 | Freq: Every day | NASAL | Status: DC

## 2015-07-12 MED ORDER — ALBUTEROL SULFATE HFA 108 (90 BASE) MCG/ACT IN AERS: 2.0000 | INHALATION_SPRAY | RESPIRATORY_TRACT | Status: DC | PRN

## 2015-07-12 MED ORDER — ZIPRASIDONE HCL 20 MG PO CAPS
ORAL_CAPSULE | ORAL | Status: AC
  Administered 2015-07-12: 60 mg via ORAL
  Filled 2015-07-12: qty 3

## 2015-07-12 MED ORDER — MONTELUKAST SODIUM 10 MG PO TABS
10.0000 mg | ORAL_TABLET | Freq: Every day | ORAL | Status: DC
  Administered 2015-07-12 – 2015-07-14 (×3): 10 mg via ORAL
  Filled 2015-07-12 (×3): qty 1

## 2015-07-12 MED ORDER — ALUM & MAG HYDROXIDE-SIMETH 200-200-20 MG/5ML PO SUSP: 30.0000 mL | ORAL | Status: DC | PRN

## 2015-07-12 MED ORDER — ZIPRASIDONE HCL 40 MG PO CAPS
60.0000 mg | ORAL_CAPSULE | Freq: Every day | ORAL | Status: DC
  Administered 2015-07-13 – 2015-07-14 (×2): 60 mg via ORAL
  Filled 2015-07-12 (×2): qty 1

## 2015-07-12 NOTE — ED Provider Notes (Addendum)
[facility] Emergency Department Provider Note   ____________________________________________  Time seen: Approximately 10:18 AM  I have reviewed the triage vital signs and the nursing notes.   HISTORY  Chief Complaint Suicidal    HPI Joanna Bishop is a 60 y.o. female complaining of severe depression secondary to she has to take care of her husband that has advanced ALS and she is taking care of her 2 grandchildren secondary to her daughter being on drugs. Patient states that her daughter had an overdose this past Friday night and now she is missing. Patient states that also she has been on disability and "her disability checks have not been consistent "therefore she is also having money problems. Patient states that she went to see her psychiatrist this morning because she was so depressed and she did not directly tell her she was suicidal, but told her that she wishes she just go to bed and not wake up. Patient cried a lot in the emergency department. Patient denies any recent medical problems or illnesses including cough or cold symptoms, fever, chills, abdominal pain, dysuria, frequency, or rash. Patient states that her depression has been forthcoming worsening over the past several days.   Past Medical History  Diagnosis Date  . Depression   . Anxiety     Patient Active Problem List   Diagnosis Date Noted  . Bipolar disorder, now depressed (HCC) 07/12/2015  . 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 Insomnia 01/10/2015  . Anxiety 08/02/2014  . AB (asthmatic bronchitis) 08/02/2014  . Airway hyperreactivity 08/02/2014  . Bipolar disorder, current episode mixed, moderate (HCC) 08/02/2014  . Clinical depression 08/02/2014  . Hypercholesteremia 08/02/2014  . Cannot sleep 08/02/2014  . Gonalgia 08/02/2014  . Night sweat 08/02/2014  . Suicidal ideation 08/02/2014  . Avitaminosis D 08/02/2014  . Borderline diabetes mellitus 08/02/2014  .  Generalized hyperhidrosis 08/02/2014  . DYSPNEA 08/05/2009    Past Surgical History  Procedure Laterality Date  . Cholecystectomy    . Abdominal hysterectomy    . Total knee arthroplasty Left   . Arthroplasty    . Femur fracture surgery      Current Outpatient Rx  Name  Route  Sig  Dispense  Refill  . albuterol (PROVENTIL HFA;VENTOLIN HFA) 108 (90 BASE) MCG/ACT inhaler   Inhalation   Inhale 2 puffs into the lungs every 4 (four) hours as needed for wheezing or shortness of breath. Every 4-6 hours as needed   1 Inhaler   11   . aspirin 81 MG tablet   Oral   Take by mouth daily.         Marland Kitchen. azithromycin (ZITHROMAX) 250 MG tablet      Take 2 tablets PO on day one, and one tablet PO daily thereafter until completed.   6 tablet   0   . Calcium-Magnesium-Vitamin D (CALCIUM 500 PO)   Oral   Take 1 tablet by mouth 2 (two) times daily.         . divalproex (DEPAKOTE ER) 250 MG 24 hr tablet   Oral   Take 250 mg by mouth at bedtime. Reported on 05/17/2015         . doxylamine, Sleep, (UNISOM) 25 MG tablet   Oral   Take 1 tablet (25 mg total) by mouth at bedtime as needed. Patient not taking: Reported on 05/17/2015   30 tablet   1   . fluticasone (FLONASE) 50 MCG/ACT nasal spray   Nasal  Place 2 sprays into the nose daily.         . montelukast (SINGULAIR) 10 MG tablet   Oral   Take 1 tablet (10 mg total) by mouth daily.   90 tablet   3   . MULTIPLE VITAMIN PO   Oral   Take 1 tablet by mouth daily.         . predniSONE (DELTASONE) 10 MG tablet      Take 6 tabs PO on day 1&2, 5 tabs PO on day 3&4, 4 tabs PO on day 5&6, 3 tabs PO on day 7&8, 2 tabs PO on day 9&10, 1 tab PO on day 11&12.   42 tablet   0   . Umeclidinium Bromide (INCRUSE ELLIPTA) 62.5 MCG/INH AEPB   Inhalation   Inhale 1 puff into the lungs daily.   30 each   13   . ziprasidone (GEODON) 60 MG capsule   Oral   Take by mouth.           Allergies Penicillins  Family History    Problem Relation Age of Onset  . Diabetes Mother   . COPD Mother   . Diabetes Father   . Coronary artery disease Father   . Coronary artery disease Maternal Grandmother   . Heart attack Maternal Grandmother   . Diabetes Paternal Grandmother     Social History Social History  Substance Use Topics  . Smoking status: Never Smoker   . Smokeless tobacco: Never Used  . Alcohol Use: No    Review of Systems Constitutional: No fever/chills Eyes: No visual changes. ENT: No sore throat. Cardiovascular: Denies chest pain. Respiratory: Denies shortness of breath. Gastrointestinal: No abdominal pain.  No nausea, no vomiting.  No diarrhea.  No constipation. Genitourinary: Negative for dysuria. Musculoskeletal: Negative for back pain. Skin: Negative for rash. Neurological: Negative for headaches, focal weakness or numbness. Psychological: Severely depressed, crying a lot, wish and she would go to sleep and not wake up. 10-point ROS otherwise negative.  ____________________________________________   PHYSICAL EXAM:  VITAL SIGNS: ED Triage Vitals  Enc Vitals Group     BP 07/12/15 0853 134/95 mmHg     Pulse Rate 07/12/15 0853 95     Resp 07/12/15 0853 20     Temp --      Temp Source 07/12/15 0853 Oral     SpO2 07/12/15 0853 95 %     Weight 07/12/15 0853 247 lb (112.038 kg)     Height 07/12/15 0853  (1.626 m)     Head Cir --      Peak Flow --      Pain Score 07/12/15 0853 0     Pain Loc --      Pain Edu? --      Excl. in GC? --     Constitutional: Alert and oriented. Well appearing and in no acute distress. Eyes: Conjunctivae are normal. PERRL. EOMI. Head: Atraumatic. Nose: No congestion/rhinnorhea. Mouth/Throat: Mucous membranes are moist.  Oropharynx non-erythematous. Neck: No stridor.   Cardiovascular: Normal rate, regular rhythm. Grossly normal heart sounds.  Good peripheral circulation. Respiratory: Normal respiratory effort.  No retractions. Lungs  CTAB. Gastrointestinal: Soft and nontender. No distention. No abdominal bruits. No CVA tenderness. Musculoskeletal: No lower extremity tenderness nor edema.  No joint effusions. Neurologic:  Normal speech and language. No gross focal neurologic deficits are appreciated. No gait instability. Skin:  Skin is warm, dry and intact. No rash noted. Psychiatric: Mood and affect are  Very depressed and patient was crying in the emergency department. Speech and behavior are normal.  ____________________________________________   LABS (all labs ordered are listed, but only abnormal results are displayed)  Labs Reviewed  COMPREHENSIVE METABOLIC PANEL - Abnormal; Notable for the following:    Glucose, Bld 154 (*)    All other components within normal limits  ACETAMINOPHEN LEVEL - Abnormal; Notable for the following:    Acetaminophen (Tylenol), Serum <10 (*)    All other components within normal limits  ETHANOL  SALICYLATE LEVEL  CBC  URINE DRUG SCREEN, QUALITATIVE ([facility] ONLY)   ____________________________________________  EKG   ____________________________________________  RADIOLOGY   ____________________________________________   PROCEDURES  Procedure(s) performed: None  Critical Care performed: No  ____________________________________________   INITIAL IMPRESSION / ASSESSMENT AND PLAN / ED COURSE  Pertinent labs & imaging results that were available during my care of the patient were reviewed by me and considered in my medical decision making (see chart for details).  3:34 PM Patient will have assessment team evaluation as well as routine labs. Patient is medically cleared for assessment team.  3:34 PM Pt has been evaluated by Dr. Toni Amend and is going to be referred for admission at this time. ____________________________________________   FINAL CLINICAL IMPRESSION(S) / ED DIAGNOSES  Final diagnoses:  Severe depression  Suicidal ideations      NEW MEDICATIONS  STARTED DURING THIS VISIT:  Current Discharge Medication List       Note:  This document was prepared using Dragon voice recognition software and may include unintentional dictation errors.    Leona Carry, MD 07/12/15 1519  Leona Carry, MD 07/12/15 [phone]

## 2015-07-12 NOTE — Tx Team (Signed)
Initial Interdisciplinary Treatment Plan   PATIENT STRESSORS: Financial difficulties Health problems Traumatic event   PATIENT STRENGTHS: Ability for insight Average or above average intelligence Communication skills Motivation for treatment/growth Religious Affiliation Supportive family/friends   PROBLEM LIST: Problem List/Patient Goals Date to be addressed Date deferred Reason deferred Estimated date of resolution  "get some rest"      "Develop better coping skills"                                                 DISCHARGE CRITERIA:  Ability to meet basic life and health needs Improved stabilization in mood, thinking, and/or behavior  PRELIMINARY DISCHARGE PLAN: Return to previous living arrangement  PATIENT/FAMIILY INVOLVEMENT: This treatment plan has been presented to and reviewed with the patient, Josephine Cablesamela S Uselman, and/or family member, .  The patient and family have been given the opportunity to ask questions and make suggestions.  Leonia ReaderCobb, Keefer Soulliere B 07/12/2015, 6:36 PM

## 2015-07-12 NOTE — ED Notes (Signed)
BEHAVIORAL HEALTH ROUNDING Patient sleeping: No. Patient alert and oriented: yes Behavior appropriate: Yes.  ; If no, describe:  Nutrition and fluids offered: yes Toileting and hygiene offered: Yes  Sitter present: q15 minute observations and security  monitoring Law enforcement present: Yes  ODS  

## 2015-07-12 NOTE — ED Notes (Signed)
[facility]  She has a visitor and when they are finished we will transfer

## 2015-07-12 NOTE — Progress Notes (Signed)
[facility] Dr. Toni Amendlapacs.  Attending Physician will be Dr. Ardyth HarpsHernandez.   Patient has been assigned to room 305A, by [facility] Charge Nurse LilbournPhyllis.   Intake Paper Work has been signed and placed on patient chart.  ER staff is aware of the admission Carlisle Beers( [facility] Sect.; ER MD; Irish Lackuthie Patient's Nurse & Byrd HesselbachMaria Patient Access).   07/12/2015 Cheryl FlashNicole Ramon Zanders, MS, NCC, LPCA Therapeutic Triage Specialist

## 2015-07-12 NOTE — ED Notes (Signed)
Nurse talked with Patient, she denies Si at this time, she just states she did not sleep last night much because her husband is sick and weak and she is also raising her grand-daughters that are 7 and 10, she states that her daughter is on heroin and had to be resuscitated this past Saturday night. Patient states that she tries to put everything in Gods hands, but she is more stressed out than she even thought, because when she went to see Dr. Today she could not stop crying, Patient states she did not want to kill herself, but if she could lay down and just die it would be nice. Patient states " I know I need rest, and I have been so overwhelmed. Patient is pleasant and is safe, q 15 min. Checks and camera surveillance in progress. .Marland Kitchen
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
## 2015-07-12 NOTE — Consult Note (Signed)
[facility] Consult   Reason for Consult:  Consult for this 60 year old woman voluntarily referred to the emergency room by her outpatient psychiatrist because of worsening depression and concern about suicidality Referring Physician:  Lovena Le Patient Identification: Joanna Bishop MRN:  [id number] Principal Diagnosis: Bipolar disorder, now depressed [facility]) Diagnosis:   Patient Active Problem List   Diagnosis Date Noted  . Bipolar disorder, now depressed ([place]) [F31.30] 07/12/2015  . 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 Insomnia [G47.00] 01/10/2015  . Anxiety [F41.9] 08/02/2014  . AB (asthmatic bronchitis) [J45.909] 08/02/2014  . Airway hyperreactivity [J45.909] 08/02/2014  . Bipolar disorder, current episode mixed, moderate ([place]) [F31.62] 08/02/2014  . Clinical depression [F32.9] 08/02/2014  . Hypercholesteremia [E78.00] 08/02/2014  . Cannot sleep [G47.00] 08/02/2014  . Gonalgia [M25.569] 08/02/2014  . Night sweat [R61] 08/02/2014  . Suicidal ideation [R45.851] 08/02/2014  . Avitaminosis D [E55.9] 08/02/2014  . Borderline diabetes mellitus [R73.03] 08/02/2014  . Generalized hyperhidrosis [R61] 08/02/2014  . DYSPNEA [R06.02] 08/05/2009    Total Time spent with patient: 1 hour  Subjective:   Joanna Bishop is a 60 y.o. female patient admitted with "I'm just feel so overwhelmed".  HPI:  Patient interviewed. Chart reviewed including old notes relating to mental health issues. Labs and vitals and medications reviewed. Spoke with outpatient psychiatric provider Dr. Nicolasa Ducking and reviewed her notes from her office. Patient has been seeing Dr. 4 for many months for treatment of depression as part of a diagnosis of bipolar disorder. She came for her regular appointment today and reported that she was feeling worse. She described herself as feeling overwhelmed and having suicidal thoughts. Patient tells me that she told Dr. Nicolasa Ducking that she wished that she could go to  sleep and never wake up but that she did not necessarily mean that she had any thought of killing herself. She denies to me that she actually was planning on doing anything to herself. She admits however that for the past week she is been feeling even worse. She feels frightened upset and overwhelmed by her stress. Major stress is a husband who has ALS and for whom she is having to provide more and more physical care. She also has daughters with problems including one who overdosed and has a drug abuse problem. Asian's own health seems to of been bad recently. She recently complained of bronchitis and was started on azithromycin and a brief steroid taper. She says that she has been compliant with her current medicines which are citalopram and Geodon. She denies that she's been abusing alcohol or drugs. Patient presents as a slightly disheveled tearful but forthcoming woman who seems to be feeling quite bad.  Social history: Married. Husband apparently has ALS and is starting to gradually get sicker. This of course worries the patient. She also has some daughters who have major problems including substance abuse and she worries about that. Patient worked at [employer] but has been out of work for many months. Dr. Nicolasa Ducking urged the patient to go back to work recently and suspects that that urging may have also been part of what has stressed her out so much. Nevertheless they have major financial problems.  Medical history: Overweight. Chronic COPD and asthma. Recently treated for bronchitis.  Substance abuse history: Patient reports that she used to drink heavily until about 2001 but at that time stopped. She says she drinks very little now and not abusively and denies any other drug use.  Past Psychiatric History: Long-standing problems  with depression and has been diagnosed with bipolar disorder in the past. Recently has also been complaining of visual hallucinations but just shadows out of the corner of her eye.  Has been treated with antidepressants and mood stabilizers. Currently taking Celexa and Geodon. Patient says she has a history of one suicide attempt as a teenager and at least one previous psychiatric hospitalization several years ago.  Risk to Self: Is patient at risk for suicide?: Yes Risk to Others:   Prior Inpatient Therapy:   Prior Outpatient Therapy:    Past Medical History:  Past Medical History  Diagnosis Date  . Depression   . Anxiety     Past Surgical History  Procedure Laterality Date  . Cholecystectomy    . Abdominal hysterectomy    . Total knee arthroplasty Left   . Arthroplasty    . Femur fracture surgery     Family History:  Family History  Problem Relation Age of Onset  . Diabetes Mother   . COPD Mother   . Diabetes Father   . Coronary artery disease Father   . Coronary artery disease Maternal Grandmother   . Heart attack Maternal Grandmother   . Diabetes Paternal Grandmother    Family Psychiatric  History: Patient says her mother has bipolar disorder. No history of suicidal behavior in the family Social History:  History  Alcohol Use No     History  Drug Use No    Social History   Social History  . Marital Status: Married    Spouse Name: N/A  . Number of Children: N/A  . Years of Education: N/A   Social History Main Topics  . Smoking status: Never Smoker   . Smokeless tobacco: Never Used  . Alcohol Use: No  . Drug Use: No  . Sexual Activity: Not Asked   Other Topics Concern  . None   Social History Narrative   Additional Social History:    Allergies:   Allergies  Allergen Reactions  . Penicillins     Labs:  Results for orders placed or performed during the hospital encounter of 07/12/15 (from the past 48 hour(s))  Comprehensive metabolic panel     Status: Abnormal   Collection Time: 07/12/15  8:55 AM  Result Value Ref Range   Sodium 139 135 - 145 mmol/L   Potassium 4.3 3.5 - 5.1 mmol/L   Chloride 109 101 - 111 mmol/L   CO2  22 22 - 32 mmol/L   Glucose, Bld 154 (H) 65 - 99 mg/dL   BUN 18 6 - 20 mg/dL   Creatinine, Ser 0.91 0.44 - 1.00 mg/dL   Calcium 9.4 8.9 - 10.3 mg/dL   Total Protein 7.4 6.5 - 8.1 g/dL   Albumin 4.4 3.5 - 5.0 g/dL   AST 35 15 - 41 U/L   ALT 51 14 - 54 U/L   Alkaline Phosphatase 82 38 - 126 U/L   Total Bilirubin 0.9 0.3 - 1.2 mg/dL   GFR calc non Af Amer >60 >60 mL/min   GFR calc Af Amer >60 >60 mL/min    Comment: (NOTE) The eGFR has been calculated using the CKD EPI equation. This calculation has not been validated in all clinical situations. eGFR's persistently <60 mL/min signify possible Chronic Kidney Disease.    Anion gap 8 5 - 15  Ethanol     Status: None   Collection Time: 07/12/15  8:55 AM  Result Value Ref Range   Alcohol, Ethyl (B) <5 <5 mg/dL  Comment:        LOWEST DETECTABLE LIMIT FOR SERUM ALCOHOL IS 5 mg/dL FOR MEDICAL PURPOSES ONLY   Salicylate level     Status: None   Collection Time: 07/12/15  8:55 AM  Result Value Ref Range   Salicylate Lvl <7.8 2.8 - 30.0 mg/dL  Acetaminophen level     Status: Abnormal   Collection Time: 07/12/15  8:55 AM  Result Value Ref Range   Acetaminophen (Tylenol), Serum <10 (L) 10 - 30 ug/mL    Comment:        THERAPEUTIC CONCENTRATIONS VARY SIGNIFICANTLY. A RANGE OF 10-30 ug/mL MAY BE AN EFFECTIVE CONCENTRATION FOR MANY PATIENTS. HOWEVER, SOME ARE BEST TREATED AT CONCENTRATIONS OUTSIDE THIS RANGE. ACETAMINOPHEN CONCENTRATIONS >150 ug/mL AT 4 HOURS AFTER INGESTION AND >50 ug/mL AT 12 HOURS AFTER INGESTION ARE OFTEN ASSOCIATED WITH TOXIC REACTIONS.   cbc     Status: None   Collection Time: 07/12/15  8:55 AM  Result Value Ref Range   WBC 8.2 3.6 - 11.0 K/uL   RBC 4.99 3.80 - 5.20 MIL/uL   Hemoglobin 14.7 12.0 - 16.0 g/dL   HCT 42.7 35.0 - 47.0 %   MCV 85.7 80.0 - 100.0 fL   MCH 29.5 26.0 - 34.0 pg   MCHC 34.4 32.0 - 36.0 g/dL   RDW 13.4 11.5 - 14.5 %   Platelets 219 150 - 440 K/uL  Urine Drug Screen,  Qualitative     Status: None   Collection Time: 07/12/15 10:04 AM  Result Value Ref Range   Tricyclic, Ur Screen NONE DETECTED NONE DETECTED   Amphetamines, Ur Screen NONE DETECTED NONE DETECTED   MDMA (Ecstasy)Ur Screen NONE DETECTED NONE DETECTED   Cocaine Metabolite,Ur [place] NONE DETECTED NONE DETECTED   Opiate, Ur Screen NONE DETECTED NONE DETECTED   Phencyclidine (PCP) Ur S NONE DETECTED NONE DETECTED   Cannabinoid 50 Ng, Ur [place] NONE DETECTED NONE DETECTED   Barbiturates, Ur Screen NONE DETECTED NONE DETECTED   Benzodiazepine, Ur Scrn NONE DETECTED NONE DETECTED   Methadone Scn, Ur NONE DETECTED NONE DETECTED    Comment: (NOTE) 295  Tricyclics, urine               Cutoff 1000 ng/mL 200  Amphetamines, urine             Cutoff 1000 ng/mL 300  MDMA (Ecstasy), urine           Cutoff 500 ng/mL 400  Cocaine Metabolite, urine       Cutoff 300 ng/mL 500  Opiate, urine                   Cutoff 300 ng/mL 600  Phencyclidine (PCP), urine      Cutoff 25 ng/mL 700  Cannabinoid, urine              Cutoff 50 ng/mL 800  Barbiturates, urine             Cutoff 200 ng/mL 900  Benzodiazepine, urine           Cutoff 200 ng/mL 1000 Methadone, urine                Cutoff 300 ng/mL 1100 1200 The urine drug screen provides only a preliminary, unconfirmed 1300 analytical test result and should not be used for non-medical 1400 purposes. Clinical consideration and professional judgment should 1500 be applied to any positive drug screen result due to possible 1600 interfering substances. A more specific alternate chemical  method 1700 must be used in order to obtain a confirmed analytical result.  1800 Gas chromato graphy / mass spectrometry (GC/MS) is the preferred 1900 confirmatory method.     Current Facility-Administered Medications  Medication Dose Route Frequency Provider Last Rate Last Dose  . albuterol (PROVENTIL HFA;VENTOLIN HFA) 108 (90 Base) MCG/ACT inhaler 2 puff  2 puff Inhalation Q4H PRN Gonzella Lex, MD      . aspirin EC tablet 81 mg  81 mg Oral Daily John T Clapacs, MD      . citalopram (CELEXA) tablet 20 mg  20 mg Oral Daily John T Clapacs, MD      . fluticasone (FLONASE) 50 MCG/ACT nasal spray 2 spray  2 spray Each Nare Daily John T Clapacs, MD      . montelukast (SINGULAIR) tablet 10 mg  10 mg Oral QHS John T Clapacs, MD      . ziprasidone (GEODON) capsule 60 mg  60 mg Oral Q supper Gonzella Lex, MD       Current Outpatient Prescriptions  Medication Sig Dispense Refill  . albuterol (PROVENTIL HFA;VENTOLIN HFA) 108 (90 BASE) MCG/ACT inhaler Inhale 2 puffs into the lungs every 4 (four) hours as needed for wheezing or shortness of breath. Every 4-6 hours as needed 1 Inhaler 11  . aspirin 81 MG tablet Take by mouth daily.    Marland Kitchen azithromycin (ZITHROMAX) 250 MG tablet Take 2 tablets PO on day one, and one tablet PO daily thereafter until completed. 6 tablet 0  . Calcium-Magnesium-Vitamin D (CALCIUM 500 PO) Take 1 tablet by mouth 2 (two) times daily.    . divalproex (DEPAKOTE ER) 250 MG 24 hr tablet Take 250 mg by mouth at bedtime. Reported on 05/17/2015    . doxylamine, Sleep, (UNISOM) 25 MG tablet Take 1 tablet (25 mg total) by mouth at bedtime as needed. (Patient not taking: Reported on 05/17/2015) 30 tablet 1  . fluticasone (FLONASE) 50 MCG/ACT nasal spray Place 2 sprays into the nose daily.    . montelukast (SINGULAIR) 10 MG tablet Take 1 tablet (10 mg total) by mouth daily. 90 tablet 3  . MULTIPLE VITAMIN PO Take 1 tablet by mouth daily.    . predniSONE (DELTASONE) 10 MG tablet Take 6 tabs PO on day 1&2, 5 tabs PO on day 3&4, 4 tabs PO on day 5&6, 3 tabs PO on day 7&8, 2 tabs PO on day 9&10, 1 tab PO on day 11&12. 42 tablet 0  . Umeclidinium Bromide (INCRUSE ELLIPTA) 62.5 MCG/INH AEPB Inhale 1 puff into the lungs daily. 30 each 13  . ziprasidone (GEODON) 60 MG capsule Take by mouth.      Musculoskeletal: Strength & Muscle Tone: within normal limits Gait & Station:  normal Patient leans: N/A  Psychiatric Specialty Exam: Review of Systems  Constitutional: Positive for malaise/fatigue.  HENT: Negative.   Eyes: Negative.   Respiratory: Positive for cough.   Cardiovascular: Negative.   Gastrointestinal: Negative.   Musculoskeletal: Negative.   Skin: Negative.   Neurological: Negative.   Psychiatric/Behavioral: Positive for depression, suicidal ideas and hallucinations. Negative for memory loss and substance abuse. The patient is nervous/anxious and has insomnia.     Blood pressure 134/95, pulse 95, resp. rate 20, height 5' 4"  (1.626 m), weight 112.038 kg (247 lb), SpO2 95 %.Body mass index is 42.38 kg/(m^2).  General Appearance: Casual  Eye Contact::  Fair  Speech:  Slow  Volume:  Decreased  Mood:  Dysphoric  Affect:  Depressed and Tearful  Thought Process:  Goal Directed  Orientation:  Full (Time, Place, and Person)  Thought Content:  Hallucinations: Visual  Suicidal Thoughts:  Yes.  without intent/plan  Homicidal Thoughts:  No  Memory:  Immediate;   Good Recent;   Good Remote;   Fair  Judgement:  Fair  Insight:  Fair  Psychomotor Activity:  Decreased  Concentration:  Fair  Recall:  [employer] of Knowledge:Fair  Language: Fair  Akathisia:  No  Handed:  Right  AIMS (if indicated):     Assets:  Communication Skills Desire for Improvement Housing Resilience  ADL's:  Intact  Cognition: WNL  Sleep:      Treatment Plan Summary: Daily contact with patient to assess and evaluate symptoms and progress in treatment, Medication management and Plan Patient with bipolar disorder depressed 2 seems to be getting worse. Tearful feeling overwhelmed. Having suicidal thoughts although now she is denying intent or plan. Earlier today it sounds like she had made much more strongly worded statements about suicidality to her outpatient psychiatrist. One thing that I do notice that could be relevant is that several days ago she was prescribed a steroid  taper along with an antibiotic for bronchitis. Patient denied it when I had asked her earlier she was taking steroids but according to the reconciliation pharmacy confirmed that it was prescribed. This would account possibly for why her blood sugars are running high also. This could be playing a part in the worsening of her mood. Nevertheless I think the patient qualifies for inpatient hospitalization with the suicidal thoughts and worsening mood and history of bipolar disorder. She is agreeable to voluntary admission. Orders completed. Continue outpatient medicines but not the steroids for now. She will be on continuous observation. I will pass on the information to her outpatient psychiatrist.  Disposition: Recommend psychiatric Inpatient admission when medically cleared. Supportive therapy provided about ongoing stressors.  Alethia Berthold, MD 07/12/2015 2:20 PM

## 2015-07-12 NOTE — Progress Notes (Signed)
60 year old female received from the ED in scrubs.  Tearful with sad affect.  Denies SI.  States "I just didn't care if I went to sleep and never woke up again"  Verbalizes that she was just feeling overwhelmed.   Body search and skin assessment performed.  No contraband found.  Skin warm and dry to touch.  No broken areas noted.

## 2015-07-12 NOTE — ED Notes (Signed)
[facility] Patient voices understanding. Nurse gave Regina EckPhyllis [facility], nurse took all her belongings with her, Patient without any signs of distress at this time.

## 2015-07-12 NOTE — ED Notes (Signed)
Pt moved from 19H to room 22. Pt given phone, cracker, peanut butter and ginger ale. Pt tearful after hanging up phone.

## 2015-07-12 NOTE — ED Notes (Signed)
Pt transferred over from main ed, she is to be adm to [facility] as a voluntary pt, pt offers no c/o and is in no acute distress at this time

## 2015-07-12 NOTE — ED Notes (Signed)
Pt to ed from dr Lurlean Leydenkapurs office.  Pt states she was there for a regular check up and told dr Maryruth Bunkapur, "I want to lay down and not wake up"  Pt denies SI at this time. Denies HI, denies hallucinations. Pt states she feels stressed due to health condition of husband and that her daughter overdosed on Friday.  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  Pt states she feels depressed and and overwhelmed.  Pt tearful during assessment at triage.

## 2015-07-13 ENCOUNTER — Encounter: Payer: Self-pay | Admitting: Psychiatry

## 2015-07-13 DIAGNOSIS — F3130 Bipolar disorder, current episode depressed, mild or moderate severity, unspecified: Secondary | ICD-10-CM

## 2015-07-13 LAB — GLUCOSE, CAPILLARY: GLUCOSE-CAPILLARY: 136 mg/dL — AB (ref 65–99)

## 2015-07-13 LAB — HEMOGLOBIN A1C: HEMOGLOBIN A1C: 6 % (ref 4.0–6.0)

## 2015-07-13 MED ORDER — CLONAZEPAM 0.5 MG PO TABS: 0.5000 mg | ORAL_TABLET | Freq: Three times a day (TID) | ORAL | Status: DC | PRN

## 2015-07-13 MED ORDER — CALCIUM-MAGNESIUM-VITAMIN D 500-250-200 MG-MG-UNIT PO TABS: ORAL_TABLET | Freq: Two times a day (BID) | ORAL | Status: DC

## 2015-07-13 MED ORDER — MAGNESIUM OXIDE 400 (241.3 MG) MG PO TABS
200.0000 mg | ORAL_TABLET | Freq: Two times a day (BID) | ORAL | Status: DC
  Administered 2015-07-13 – 2015-07-15 (×5): 200 mg via ORAL
  Filled 2015-07-13 (×3): qty 1
  Filled 2015-07-13: qty 2
  Filled 2015-07-13: qty 1

## 2015-07-13 MED ORDER — CALCIUM CARBONATE-VITAMIN D 500-200 MG-UNIT PO TABS
1.0000 | ORAL_TABLET | Freq: Two times a day (BID) | ORAL | Status: DC
  Administered 2015-07-13 – 2015-07-15 (×5): 1 via ORAL
  Filled 2015-07-13 (×6): qty 1

## 2015-07-13 NOTE — Progress Notes (Signed)
D: Patient stated slept good last night .Stated appetite is good and energy level  Is normal. Stated concentration is good . Stated on Depression scale , hopeless and anxiety .( low 0-10 high) Denies suicidal  homicidal ideations  .  No auditory hallucinations  No pain concerns . Appropriate ADL'S. Interacting with peers and staff.  Cheerful  On approach. Voice no concerns around her situations at home  A: Encourage patient participation with unit programming . Instruction  Given on  Medication , verbalize understanding. R: Voice no other concerns. Staff continue to monitor

## 2015-07-13 NOTE — [facility] Group Notes (Signed)
[facility] Group Notes:  (Nursing/MHT/Case Management/Adjunct)  Date:  07/13/2015  Time:  4:18 PM  Type of Therapy:  Group Therapy  Participation Level:  Did Not Attend  Participation Quality Mayra NeerJackie L Jireh Elmore 07/13/2015, 4:18 PM

## 2015-07-13 NOTE — Plan of Care (Signed)
Problem: Coping: Goal: Ability to cope will improve Outcome: Not Progressing New Admission not progressing  Will review coping  Skills

## 2015-07-13 NOTE — H&P (Addendum)
Psychiatric Admission Assessment Adult  Patient Identification: Joanna Bishop MRN:  [id number] Date of EvaluationGENELL THEDE7 Chief Complaint:  Bipolar Disorder Principal Diagnosis: Bipolar depression (HCC) Diagnosis:   Patient Active Problem List   Diagnosis Date Noted  . Bipolar depression (HCC) [F31.30] 07/12/2015  . 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 AB (asthmatic bronchitis) [J45.909] 08/02/2014  . Hypercholesteremia [E78.00] 08/02/2014  . Suicidal ideation [R45.851] 08/02/2014  . Avitaminosis D [E55.9] 08/02/2014  . Borderline diabetes mellitus [R73.03] 08/02/2014  . Generalized hyperhidrosis [R61] 08/02/2014   History of Present Illness:  Joanna Bishop is an 60 y.o. female. Who has been referred to the ED on 5/16 by her outpatient psychiatrist. Per the pt psyhiatrist (Dr. Maryruth Bun) pt shared that she wanted to go to sleep and didn't care if she woke up. Pt reports a history of Depression and Bipolar Disorder. Pt states that her husband has ALS and that she is his primary caregiver. Pt states that she often does not sleep due to worrying about her husband not being able to breath. Pt reports sleeping approximately 2 hours per night. She also has a daughter who is struggling with substance abuse and who reportedly overdosed on heroin this past weekend. Pt says her daughter is now missing.  Spoke with outpatient psychiatric Dr Maryruth Bun this morning. Pt treated for bipolar disorder. Has been under her care since December 2016.  She went for her regular appointment yesterday and reported that she was feeling worse. She described herself as feeling overwhelmed and having suicidal thoughts. Dr Maryruth Bun refer her to the ER for eval and admission into our unit.  Dr Maryruth Bun reports pt has done well on citalopram and geodon.    Patient reports compliance with medications.  Thinks her worsening mood is cause by all her current stressors.  Says her husband just sign up (April) to  receive benefits trough the [place].  They have applied for home health and home equipment (electric wheelchair).   Patient has been out of work on [employer] for several months,  says she is suppose to return to work at [employer] h weeks.  Patient says it will be impossible for her to do that at this time.  She has apply for disability on the basis of bipolar d/o and past issues with her right leg (knee replacement).  Substance abuse history: Patient reports that she used to drink heavily until about 2001 but at that time stopped. She says she drinks very little now and not abusively and denies any other drug use.   Pt has shared that she has previously attempted suicide on one occasion. Pt most recent inpatient admission was at [facility] in 2001.   Associated Signs/Symptoms: Depression Symptoms:  depressed mood, insomnia, hopelessness, recurrent thoughts of death, loss of energy/fatigue, (Hypo) Manic Symptoms:  denies Anxiety Symptoms:  Excessive Worry, Psychotic Symptoms:  denies PTSD Symptoms: Negative   Total Time spent with patient: 1 hour  Past Psychiatric History: Long-standing problems with depression and has been diagnosed with bipolar disorder in the past. Recently has also been complaining of visual hallucinations but just shadows out of the corner of her eye. Has been treated with antidepressants and mood stabilizers. Currently taking Celexa and Geodon. Patient says she has a history of one suicide attempt as a teenager and at least one previous psychiatric hospitalization several years ago.  Is the patient at risk to self? Yes.    Has the patient been a risk to self in  the past 6 months? No.  Has the patient been a risk to self within the distant past? Yes.    Is the patient a risk to others? No.  Has the patient been a risk to others in the past 6 months? No.  Has the patient been a risk to others within the distant past? No.    Past Medical History: Overweight. Chronic  COPD and asthma. Recently treated for bronchitis.  Past Medical History  Diagnosis Date  . Depression   . Anxiety   . Asthma     Past Surgical History  Procedure Laterality Date  . Cholecystectomy    . Abdominal hysterectomy    . Total knee arthroplasty Left   . Arthroplasty    . Femur fracture surgery     Family History:  Family History  Problem Relation Age of Onset  . Diabetes Mother   . COPD Mother   . Diabetes Father   . Coronary artery disease Father   . Coronary artery disease Maternal Grandmother   . Heart attack Maternal Grandmother   . Diabetes Paternal Grandmother    Family Psychiatric  History: denies  Tobacco Screening: non smoker  Social History:  Married. Husband apparently has ALS and is starting to gradually get sicker. This of course worries the patient. She also has some daughters who have major problems including substance abuse and she worries about that. Patient worked at [employer] but has been out of work for many months. Dr. Maryruth BunKapur urged the patient to go back to work recently and suspects that that urging may have also been part of what has stressed her out so much. Nevertheless they have major financial problems. History  Alcohol Use: Not on file     History  Drug Use Not on file   Allergies:   Allergies  Allergen Reactions  . Penicillins    Lab Results:  Results for orders placed or performed during the hospital encounter of 07/12/15 (from the past 48 hour(s))  Hemoglobin A1c     Status: None   Collection Time: 07/12/15  7:23 PM  Result Value Ref Range   Hgb A1c MFr Bld 6.0 4.0 - 6.0 %  Lipid panel, fasting     Status: Abnormal   Collection Time: 07/12/15  7:23 PM  Result Value Ref Range   Cholesterol 165 0 - 200 mg/dL   Triglycerides 161367 (H) <150 mg/dL   HDL 36 (L) >09>40 mg/dL   Total CHOL/HDL Ratio 4.6 RATIO   VLDL 73 (H) 0 - 40 mg/dL   LDL Cholesterol 56 0 - 99 mg/dL    Comment:        Total Cholesterol/HDL:CHD Risk Coronary Heart  Disease Risk Table                     Men   Women  1/2 Average Risk   3.4   3.3  Average Risk       5.0   4.4  2 X Average Risk   9.6   7.1  3 X Average Risk  23.4   11.0        Use the calculated Patient Ratio above and the CHD Risk Table to determine the patient's CHD Risk.        ATP III CLASSIFICATION (LDL):  <100     mg/dL   Optimal  [id number]  mg/dL   Near or Above  Optimal  130-159  mg/dL   Borderline  161-096  mg/dL   High  >045     mg/dL   Very High   TSH     Status: None   Collection Time: 07/12/15  7:23 PM  Result Value Ref Range   TSH 3.030 0.350 - 4.500 uIU/mL    Blood Alcohol level:  Lab Results  Component Value Date   ETH <5 07/12/2015    Metabolic Disorder Labs:  Lab Results  Component Value Date   HGBA1C 6.0 07/12/2015   No results found for: PROLACTIN Lab Results  Component Value Date   CHOL 165 07/12/2015   TRIG 367* 07/12/2015   HDL 36* 07/12/2015   CHOLHDL 4.6 07/12/2015   VLDL 73* 07/12/2015   LDLCALC 56 07/12/2015   LDLCALC 98 09/13/2014    Current Medications: Current Facility-Administered Medications  Medication Dose Route Frequency Provider Last Rate Last Dose  . acetaminophen (TYLENOL) tablet 650 mg  650 mg Oral Q6H PRN Audery Amel, MD      . albuterol (PROVENTIL HFA;VENTOLIN HFA) 108 (90 Base) MCG/ACT inhaler 2 puff  2 puff Inhalation Q4H PRN Audery Amel, MD      . alum & mag hydroxide-simeth (MAALOX/MYLANTA) 200-200-20 MG/5ML suspension 30 mL  30 mL Oral Q4H PRN Audery Amel, MD      . aspirin EC tablet 81 mg  81 mg Oral Daily Audery Amel, MD      . Calcium-Magnesium-Vitamin D 500-250-200 MG-MG-UNIT TABS   Oral BID Jimmy Footman, MD      . citalopram (CELEXA) tablet 20 mg  20 mg Oral Daily John T Clapacs, MD      . fluticasone (FLONASE) 50 MCG/ACT nasal spray 2 spray  2 spray Each Nare Daily John T Clapacs, MD      . magnesium hydroxide (MILK OF MAGNESIA) suspension 30 mL  30 mL Oral Daily  PRN Audery Amel, MD      . montelukast (SINGULAIR) tablet 10 mg  10 mg Oral QHS Audery Amel, MD   10 mg at 07/12/15 2152  . umeclidinium bromide (INCRUSE ELLIPTA) 62.5 MCG/INH 1 puff  1 puff Inhalation Daily Audery Amel, MD   1 puff at 07/12/15 1856  . ziprasidone (GEODON) capsule 60 mg  60 mg Oral Q supper Audery Amel, MD       PTA Medications: Prescriptions prior to admission  Medication Sig Dispense Refill Last Dose  . albuterol (PROVENTIL HFA;VENTOLIN HFA) 108 (90 BASE) MCG/ACT inhaler Inhale 2 puffs into the lungs every 4 (four) hours as needed for wheezing or shortness of breath. Every 4-6 hours as needed 1 Inhaler 11 07/11/2015 at Unknown time  . aspirin 81 MG tablet Take by mouth daily.   07/11/2015 at Unknown time  . Calcium-Magnesium-Vitamin D (CALCIUM 500 PO) Take 1 tablet by mouth 2 (two) times daily.   07/11/2015 at Unknown time  . fluticasone (FLONASE) 50 MCG/ACT nasal spray Place 2 sprays into the nose daily.   07/11/2015 at Unknown time  . montelukast (SINGULAIR) 10 MG tablet Take 1 tablet (10 mg total) by mouth daily. 90 tablet 3 07/11/2015 at Unknown time  . MULTIPLE VITAMIN PO Take 1 tablet by mouth daily.   07/11/2015 at Unknown time  . Umeclidinium Bromide (INCRUSE ELLIPTA) 62.5 MCG/INH AEPB Inhale 1 puff into the lungs daily. 30 each 13 07/11/2015 at Unknown time  . ziprasidone (GEODON) 60 MG capsule Take by mouth.   07/11/2015  at Unknown time    Musculoskeletal: Strength & Muscle Tone: within normal limits Gait & Station: normal Patient leans: N/A  Psychiatric Specialty Exam: Physical Exam  Constitutional: She is oriented to person, place, and time. She appears well-developed and well-nourished.  HENT:  Head: Normocephalic and atraumatic.  Eyes: EOM are normal.  Neck: Normal range of motion.  Respiratory: Effort normal.  Neurological: She is alert and oriented to person, place, and time.    Review of Systems  Constitutional: Negative.   HENT: Negative.    Eyes: Negative.   Respiratory: Negative.   Cardiovascular: Negative.   Gastrointestinal: Negative.   Genitourinary: Negative.   Musculoskeletal: Negative.   Skin: Negative.   Neurological: Negative.   Endo/Heme/Allergies: Negative.   Psychiatric/Behavioral: Positive for depression and suicidal ideas. The patient is nervous/anxious and has insomnia.     Blood pressure 95/67, pulse 102, temperature 98 F (36.7 C), temperature source Oral, resp. rate 18, height 5\' 4"  (1.626 m), weight 112.038 kg (247 lb), SpO2 97 %.Body mass index is 42.38 kg/(m^2).  General Appearance: Well Groomed  Patent attorney::  Good  Speech:  Clear and Coherent  Volume:  Normal  Mood:  Dysphoric  Affect:  Blunt  Thought Process:  Logical  Orientation:  Full (Time, Place, and Person)  Thought Content:  Hallucinations: None  Suicidal Thoughts:  No  Homicidal Thoughts:  No  Memory:  Immediate;   Good Recent;   Good Remote;   Good  Judgement:  Fair  Insight:  Fair  Psychomotor Activity:  Decreased  Concentration:  Good  Recall:  Good  Fund of Knowledge:Good  Language: Good  Akathisia:  No  Handed:    AIMS (if indicated):     Assets:  Manufacturing systems engineer Physical Health  ADL's:  Intact  Cognition: WNL  Sleep:  Number of Hours: 9     Treatment Plan Summary:  Bipolar d/o continue celexa 20 mg po q am and geodon 60 mg po qhs  Anxiety: started on klonopin 0.5 mg tid prn  Asthma: continue albuterol prn, singular and incruse daily  Allergies: continue flonase  Vit d def: continue calcium and vit d  Cardiovascular health: continue ASA 81 mg  Borderline DM: hemoglobin A1c 6.  Will order low carb diet.  Precautions q 15 m checks  Hospitalization status continue voluntary admission  Diet: carb modified  Labs: will order prolactin.  Dispo once stable will return home with family  F/u: will continue to f/u with Dr Maryruth Bun  Collateral from Dr Maryruth Bun has been obtained.  Records reviewed --pt f/u  with [facility] for [facility].  I certify that inpatient services furnished can reasonably be expected to improve the patient's condition.    Jimmy Footman, MD 5/17/20178:07 AM

## 2015-07-13 NOTE — Progress Notes (Signed)
D: Pt affect is depressed this evening. She appears very tired and states "I just need to get some rest so I can feel better." She rates depression 6/10 and anxiety 0/10. Denies SI/HI/AVH at this time. Denies pain. A: Emotional support and encouragement provided. Medications administered with education. q15 minute safety checks maintained. R: Pt remains free from harm. Will continue to monitor.

## 2015-07-13 NOTE — [facility] Group Notes (Addendum)
[facility] LCSW Group Therapy  07/13/2015 3:08 PM  Type of Therapy:  Group Therapy  Participation Level:  Active  Participation Quality:  Appropriate and Attentive  Affect:  Appropriate  Cognitive:  Alert, Appropriate and Oriented  Insight:  Engaged  Engagement in Therapy:  Engaged  Modes of Intervention:  Discussion, Problem-solving, Socialization and Support  Summary of Progress/Problems: Patient attended and participated in group discussion introducing herself and sharing during an introductory exercise that her [place] vacation would [place] to [place] for the lush green and laid back view on life". Patient was supportive in group and shared that her husband is diagnosed with ALS and is a caregiver to him and also raising 2 grandchildren and life is stressful as she becomes old and less physically capable and would like to get disability for herself.   [name], Joanna Bishop, MSW, LCSW 07/13/2015, 3:08 PM

## 2015-07-13 NOTE — Progress Notes (Signed)
Recreation Therapy Notes  Date: 05.17.17 Time: 1:00 pm Location: Craft Room  Group Topic: Self-esteem  Goal Area(s) Addresses:  Patient will write at least one positive trait. Patient will verbalize benefit of having a healthy self-esteem.  Behavioral Response: Attentive, Interactive  Intervention: I Am  Activity: Patients were given a worksheet with the letter I on it and instructed to write as many positive traits inside the letter.  Education: LRT educated patients on ways they can increase their self-esteem.  Education Outcome: Acknowledges education/In group clarification offered   Clinical Observations/Feedback: Patient completed activity by writing positive traits. Patient contributed to group discussion by stating how her self-esteem affects her.  [name],Holley Kocurek M, LRT/CTRS 07/13/2015 2:09 PM

## 2015-07-13 NOTE — Tx Team (Signed)
Interdisciplinary Treatment Plan Update (Adult)  Date:  07/13/2015 Time Reviewed:  9:55 AM  Progress in Treatment: Attending groups: Yes. Participating in groups:  Yes. Taking medication as prescribed:  Yes. Tolerating medication:  Yes. Family/Significant othe contact made:  No, will contact:  husband or son Patient understands diagnosis:  Yes. Discussing patient identified problems/goals with staff:  Yes. Medical problems stabilized or resolved:  Yes. Denies suicidal/homicidal ideation: Yes. Issues/concerns per patient self-inventory:  Yes. Other:  New problem(s) identified: No, Describe:  NA  Discharge Plan or Barriers: Pt plans to return home and follow up with outpatient.    Reason for Continuation of Hospitalization: Depression Medication stabilization Suicidal ideation  Comments:Joanna Bishop is an 60 y.o. female. Who has been referred to the ED on 5/16 by her outpatient psychiatrist. Per the pt psyhiatrist (Dr. Nicolasa Ducking) pt shared that she wanted to go to sleep and didn't care if she woke up. Pt reports a history of Depression and Bipolar Disorder. Pt states that her husband has ALS and that she is his primary caregiver. Pt states that she often does not sleep due to worrying about her husband not being able to breath. Pt reports sleeping approximately 2 hours per night. She also has a daughter who is struggling with substance abuse and who reportedly overdosed on heroin this past weekend. Pt says her daughter is now missing.Spoke with outpatient psychiatric Dr Nicolasa Ducking this morning. Pt treated for bipolar disorder. Has been under her care since December 2016. She went for her regular appointment yesterday and reported that she was feeling worse. She described herself as feeling overwhelmed and having suicidal thoughts. Dr Nicolasa Ducking refer her to the ER for eval and admission into our unit. Dr Nicolasa Ducking reports pt has done well on citalopram and geodon. Patient reports compliance with  medications. Thinks her worsening mood is cause by all her current stressors. Says her husband just sign up (April) to receive benefits trough the [place]. They have applied for home health and home equipment (electric wheelchair). Patient has been out of work on [employer] for several months, says she is suppose to return to work at [employer] h weeks. Patient says it will be impossible for her to do that at this time. She has apply for disability on the basis of bipolar d/o and past issues with her right leg (knee replacement).  Estimated length of stay: 3-5 days  New goal(s): NA  Review of initial/current patient goals per problem list:   1.  Goal(s): Patient will participate in aftercare plan * Met:  * Target date: at discharge * As evidenced by: Patient will participate within aftercare plan AEB aftercare provider and housing plan at discharge being identified.   2.  Goal (s): Patient will exhibit decreased depressive symptoms and suicidal ideations. * Met:  *  Target date: at discharge * As evidenced by: Patient will utilize self rating of depression at 3 or below and demonstrate decreased signs of depression or be deemed stable for discharge by MD.   3.  Goal(s): Patient will demonstrate decreased signs and symptoms of anxiety. * Met:  * Target date: at discharge * As evidenced by: Patient will utilize self rating of anxiety at 3 or below and demonstrated decreased signs of anxiety, or be deemed stable for discharge by MD  Attendees: Patient:  Joanna Bishop 5/17/20179:55 AM  Family:   5/17/20179:55 AM  Physician:  Dr. Jerilee Hoh   5/17/20179:55 AM  Nursing:   Carolynn Sayers, RN  5/17/20179:55 AM  Case Manager:   5/17/20179:55 AM  Counselor:   5/17/20179:55 AM  Other:  Wray Kearns, LCSWA 5/17/20179:55 AM  Other:  Everitt Amber, LRT  5/17/20179:55 AM  Other:   5/17/20179:55 AM  Other:  5/17/20179:55 AM  Other:  5/17/20179:55 AM  Other:  5/17/20179:55 AM  Other:   5/17/20179:55 AM  Other:  5/17/20179:55 AM  Other:  5/17/20179:55 AM  Other:   5/17/20179:55 AM   Scribe for Treatment Team:   Wray Kearns MSW, [place] , 07/13/2015, 9:55 AM

## 2015-07-13 NOTE — Progress Notes (Signed)
Recreation Therapy Notes  INPATIENT RECREATION THERAPY ASSESSMENT  Patient Details Name: Joanna Bishop MRN: [id number] DOB: 07/25/1955 Today's Date: 07/13/2015  Patient Stressors: Family, Relationship, Death, Work, Other (Comment) daughter OD recently and after she was released from the hospital, patient doesn't know where daughter went; husband was recently diagnoses with ALS; ex-husband died last month and mother died 2 years ago; stressed over the thought of returned to work and the changed that have been made; in constant pain; taking care of her 2 granddaughters; taking care of the house and 5 dogs  Coping Skills:   Isolate, Avoidance, Art/Dance, Music, Sports, Other (Comment) (Play games on tablet, read books)  Personal Challenges: Anger, Communication, Concentration, Decision-Making, Expressing Yourself, Problem-Solving, Self-Esteem/Confidence, Social Interaction, Stress Management, Time Management, Trusting Others, Work Nutritional therapisterformance  Leisure Interests (2+):  Individual - Other (Comment) (Relax and do nothing, sitting in the yard, doing things with family)  Awareness of Community Resources:  Yes  Community Resources:  Research scientist (physical sciences)[employer], [employer]  Current Use: Yes  If no, Barriers?:    Patient Strengths:  Dedicated  Patient Identified Areas of Improvement:  Self-esteem, time management  Current Recreation Participation:  Sleeping  Patient Goal for Hospitalization:  To learn to think better about herself and learn how to handle stress  [place]  [facility]  County of Residence:  [place]   Current SI (including self-harm):  No  Current HI:  No  Consent to Intern Participation: N/A   [name],Dow Blahnik M, LRT/CTRS 07/13/2015, 3:32 PM

## 2015-07-13 NOTE — Plan of Care (Signed)
Problem: Safety: Goal: Ability to disclose and discuss suicidal ideas will improve Outcome: Progressing Pt denies SI at this time. She rates depression 6/10.  Problem: Medication: Goal: Compliance with prescribed medication regimen will improve Outcome: Progressing Pt taking medications as prescribed.

## 2015-07-13 NOTE — [facility] Suicide Risk Assessment (Signed)
[facility]   Nursing information obtained from:    Demographic factors:    Current Mental Status:    Loss Factors:    Historical Factors:    Risk Reduction Factors:     Total Time spent with patient: 1 hour Principal Problem: Bipolar depression (HCC) Diagnosis:   Patient Active Problem List   Diagnosis Date Noted  . Bipolar depression (HCC) [F31.30] 07/12/2015  . 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 AB (asthmatic bronchitis) [J45.909] 08/02/2014  . Hypercholesteremia [E78.00] 08/02/2014  . Suicidal ideation [R45.851] 08/02/2014  . Avitaminosis D [E55.9] 08/02/2014  . Borderline diabetes mellitus [R73.03] 08/02/2014  . Generalized hyperhidrosis [R61] 08/02/2014   Subjective Data:   Continued Clinical Symptoms:  Alcohol Use Disorder Identification Test Final Score (AUDIT): 0 The "Alcohol Use Disorders Identification Test", Guidelines for Use in Primary Care, Second Edition.  World Science writerHealth Organization [facility](WHO). Score between 0-7:  no or low risk or alcohol related problems. Score between 8-15:  moderate risk of alcohol related problems. Score between 16-19:  high risk of alcohol related problems. Score 20 or above:  warrants further diagnostic evaluation for alcohol dependence and treatment.   CLINICAL FACTORS:   Severe Anxiety and/or Agitation Bipolar Disorder:   Depressive phase Previous Psychiatric Diagnoses and Treatments     Psychiatric Specialty Exam: ROS  Blood pressure 95/67, pulse 102, temperature 98 F (36.7 C), temperature source Oral, resp. rate 18, height 5\' 4"  (1.626 m), weight 112.038 kg (247 lb), SpO2 97 %.Body mass index is 42.38 kg/(m^2).   COGNITIVE FEATURES THAT CONTRIBUTE TO RISK:  None    SUICIDE RISK:   Moderate:  Frequent suicidal ideation with limited intensity, and duration, some specificity in terms of plans, no associated intent, good self-control, limited dysphoria/symptomatology, some risk factors  present, and identifiable protective factors, including available and accessible social support.  [facility]  I certify that inpatient services furnished can reasonably be expected to improve the patient's condition.   Jimmy FootmanHernandez-Gonzalez,  Peyton Spengler, MD 07/13/2015, 8:09 AM

## 2015-07-14 LAB — PROLACTIN: Prolactin: 124.3 ng/mL — ABNORMAL HIGH (ref 4.8–23.3)

## 2015-07-14 MED ORDER — BUTALBITAL-APAP-CAFFEINE 50-325-40 MG PO TABS
2.0000 | ORAL_TABLET | Freq: Four times a day (QID) | ORAL | Status: DC | PRN
  Filled 2015-07-14: qty 2

## 2015-07-14 MED ORDER — BUTALBITAL-APAP-CAFFEINE 50-325-40 MG PO TABS
2.0000 | ORAL_TABLET | Freq: Once | ORAL | Status: AC
  Administered 2015-07-14: 2 via ORAL

## 2015-07-14 NOTE — Plan of Care (Signed)
Problem: Coping: Goal: Ability to cope will improve Outcome: Progressing Able to get up following headache processing information  and catch up  With routine

## 2015-07-14 NOTE — Plan of Care (Signed)
Problem: [facility] Participation in Recreation Therapeutic Interventions Goal: STG-Patient will demonstrate improved self esteem by identif STG: Self-Esteem - Within 4 treatment sessions, patient will verbalize at least 5 positive affirmation statements in each of 2 treatment sessions to increase self-esteem post d/c.  Outcome: Progressing Treatment Session 1; Completed 1 out of 2: At approximately 2:05 pm, LRT met with patient in patient room. Patient verbalized 5 positive affirmation statements. Patient reported it felt "alright". LRT encouraged patient to continue saying positive affirmation statements.  Leonette Monarch, LRT/CTRS 05.18.17 3:46 pm Goal: STG-Other Recreation Therapy Goal (Specify) STG: Stress Management - Within 4 treatment sessions, patient will verbalize understanding of the stress management techniques in each of 2 treatment sessions to increase stress management skills post d/c.  Outcome: Progressing Treatment Session 1; Completed 1 out of 2: At approximately 2:05 pm, LRT met with patient in patient room. LRT educated and provided patient with handouts on stress management techniques. Patient verbalized understanding. LRT encouraged patient to read over and practice the stress management techniques.  Leonette Monarch, LRT/CTRS 05.18.17 3:47 pm

## 2015-07-14 NOTE — Progress Notes (Signed)
   D:Patient reports of having a headache this am that has turned into a migraine . No  Participation with unit programing . Patient unable to eat lunch  Or  Breakfast. Affect flat  With depressed mood .  Denies suicidal  homicidal ideations  .  No auditory hallucinations  No pain concerns . No  Appropriate ADL'S. No Interacting with peers and staff.  A: Encourage patient participation with unit programming . Instruction  Given on  Medication , verbalize understanding. R: Voice no other concerns. Staff continue to monitor

## 2015-07-14 NOTE — Progress Notes (Signed)
Recreation Therapy Notes  Date: 05.18.17 Time: 1:00 pm Location: Craft Room  Group Topic: Leisure Education  Goal Area(s) Addresses:  Patient will identify activities for each letter of the alphabet. Patient will verbalize ability to integrate positive leisure into life post d/c. Patient will verbalize ability to use leisure as a coping skill.  Behavioral Response: Did not attend  Intervention: Leisure Alphabet  Activity: Patients were given a Leisure Alphabet worksheet and instructed to pick a leisure activity for each letter of the alphabet.  Education: LRT educated patients on what they need to participate in leisure.  Education Outcome: Patient did not attend group.   Clinical Observations/Feedback: Patient did not attend group.  Quitman Norberto M, LRT/CTRS 07/14/2015 2:55 PM 

## 2015-07-14 NOTE — [facility] Group Notes (Signed)
[facility] LCSW Group Therapy  07/14/2015 10:55 AM  Type of Therapy:  Group Therapy  Participation Level:  Did Not Attend  Modes of Intervention:  Discussion, Education, Socialization and Support  Summary of Progress/Problems: Balance in life: Patients will discuss the concept of balance and how it looks and feels to be unbalanced. Pt will identify areas in their life that is unbalanced and ways to become more balanced.    Jame Seelig L Cloee Dunwoody MSW, LCSWA  07/14/2015, 10:55 AM   

## 2015-07-14 NOTE — Progress Notes (Signed)
[facility]  07/14/2015 9:01 AM Joanna Bishop  MRN:  [id number] Subjective:  Patient reports not doing very well today as she has a severe migraine. She has been in bed all day she has not attended any groups and has not eating any meals. She has taken Tylenol with no relief. As a few hours ago she received Fioricet. Patient states the migraine is decreasing in intensity but still she is unable to get out of bed. She is reporting feeling better no longer suicidal. Denies any other issues or concerns.  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 Bipolar depression (HCC) [F31.30] 07/12/2015  . 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 AB (asthmatic bronchitis) [J45.909] 08/02/2014  . Hypercholesteremia [E78.00] 08/02/2014  . Suicidal ideation [R45.851] 08/02/2014  . Avitaminosis D [E55.9] 08/02/2014  . Borderline diabetes mellitus [R73.03] 08/02/2014  . Generalized hyperhidrosis [R61] 08/02/2014   Total Time spent with patient: 30 minutes  Past Psychiatric History:   Past Medical History:  Past Medical History  Diagnosis Date  . Depression   . Anxiety   . Asthma     Past Surgical History  Procedure Laterality Date  . Cholecystectomy    . Abdominal hysterectomy    . Total knee arthroplasty Left   . Arthroplasty    . Femur fracture surgery     Family History:  Family History  Problem Relation Age of Onset  . Diabetes Mother   . COPD Mother   . Diabetes Father   . Coronary artery disease Father   . Coronary artery disease Maternal Grandmother   . Heart  attack Maternal Grandmother   . Diabetes Paternal Grandmother    Family Psychiatric  History:  Social History:  History  Alcohol Use: Not on file     History  Drug Use Not on file    Social History   Social History  . Marital Status: Married    Spouse Name: N/A  . Number of Children: N/A  . Years of Education: N/A   Social History Main Topics  . Smoking status: Never Smoker   . Smokeless tobacco: Never Used  . Alcohol Use: None  . Drug Use: None  . Sexual Activity: Not Asked   Other Topics Concern  . None   Social History Narrative   Current Medications: Current Facility-Administered Medications  Medication Dose Route Frequency Provider Last Rate Last Dose  . acetaminophen (TYLENOL) tablet 650 mg  650 mg Oral Q6H PRN Audery AmelJohn T Clapacs, MD   650 mg at 07/14/15 [id number]  . albuterol (PROVENTIL HFA;VENTOLIN HFA) 108 (90 Base) MCG/ACT inhaler 2 puff  2 puff Inhalation Q4H PRN Audery AmelJohn T Clapacs, MD      . alum & mag hydroxide-simeth (MAALOX/MYLANTA) 200-200-20 MG/5ML suspension 30 mL  30 mL Oral Q4H PRN Audery AmelJohn T Clapacs, MD      . aspirin EC tablet 81 mg  81 mg Oral Daily Audery AmelJohn T Clapacs, MD   81 mg at 07/13/15 0957  . calcium-vitamin D (OSCAL WITH D) 500-200 MG-UNIT per tablet 1 tablet  1 tablet Oral BID  Jimmy Footman, MD   1 tablet at 07/13/15 2139   And  . magnesium oxide (MAG-OX) tablet 200 mg  200 mg Oral BID Jimmy Footman, MD   200 mg at 07/13/15 2138  . citalopram (CELEXA) tablet 20 mg  20 mg Oral Daily Audery Amel, MD   20 mg at 07/13/15 0956  . clonazePAM (KLONOPIN) tablet 0.5 mg  0.5 mg Oral TID PRN Jimmy Footman, MD      . fluticasone (FLONASE) 50 MCG/ACT nasal spray 2 spray  2 spray Each Nare Daily Audery Amel, MD   2 spray at 07/13/15 0956  . magnesium hydroxide (MILK OF MAGNESIA) suspension 30 mL  30 mL Oral Daily PRN Audery Amel, MD      . montelukast (SINGULAIR) tablet 10 mg  10 mg Oral QHS Audery Amel, MD   10 mg at 07/13/15  2139  . umeclidinium bromide (INCRUSE ELLIPTA) 62.5 MCG/INH 1 puff  1 puff Inhalation Daily Audery Amel, MD   1 puff at 07/13/15 0959  . ziprasidone (GEODON) capsule 60 mg  60 mg Oral Q supper Audery Amel, MD   60 mg at 07/13/15 1720    Lab Results:  Results for orders placed or performed during the hospital encounter of 07/12/15 (from the past 48 hour(s))  Hemoglobin A1c     Status: None   Collection Time: 07/12/15  7:23 PM  Result Value Ref Range   Hgb A1c MFr Bld 6.0 4.0 - 6.0 %  Lipid panel, fasting     Status: Abnormal   Collection Time: 07/12/15  7:23 PM  Result Value Ref Range   Cholesterol 165 0 - 200 mg/dL   Triglycerides 161 (H) <150 mg/dL   HDL 36 (L) >09 mg/dL   Total CHOL/HDL Ratio 4.6 RATIO   VLDL 73 (H) 0 - 40 mg/dL   LDL Cholesterol 56 0 - 99 mg/dL    Comment:        Total Cholesterol/HDL:CHD Risk Coronary Heart Disease Risk Table                     Men   Women  1/2 Average Risk   3.4   3.3  Average Risk       5.0   4.4  2 X Average Risk   9.6   7.1  3 X Average Risk  23.4   11.0        Use the calculated Patient Ratio above and the CHD Risk Table to determine the patient's CHD Risk.        ATP III CLASSIFICATION (LDL):  <100     mg/dL   Optimal  604-540  mg/dL   Near or Above                    Optimal  130-159  mg/dL   Borderline  981-191  mg/dL   High  >478     mg/dL   Very High   Prolactin     Status: Abnormal   Collection Time: 07/12/15  7:23 PM  Result Value Ref Range   Prolactin 124.3 (H) 4.8 - 23.3 ng/mL    Comment: (NOTE) Performed At: [facility] [address] [place], [place] [id number] Mila Homer MD MV:[id number]   TSH     Status: None   Collection Time: 07/12/15  7:23 PM  Result Value Ref Range   TSH 3.030 0.350 - 4.500 uIU/mL  Glucose,  capillary     Status: Abnormal   Collection Time: 07/13/15  9:03 PM  Result Value Ref Range   Glucose-Capillary 136 (H) 65 - 99 mg/dL    Blood Alcohol level:  Lab Results   Component Value Date   ETH <5 07/12/2015    Physical Findings: AIMS: Facial and Oral Movements Muscles of Facial Expression: None, normal Lips and Perioral Area: None, normal Jaw: None, normal Tongue: None, normal,Extremity Movements Upper (arms, wrists, hands, fingers): None, normal, Trunk Movements Neck, shoulders, hips: None, normal, Overall Severity Severity of abnormal movements (highest score from questions above): None, normal Incapacitation due to abnormal movements: None, normal Patient's awareness of abnormal movements (rate only patient's report): No Awareness, Dental Status Current problems with teeth and/or dentures?: No Does patient usually wear dentures?: No  CIWA:    COWS:     Musculoskeletal: Strength & Muscle Tone: within normal limits Gait & Station: normal Patient leans: N/A  Psychiatric Specialty Exam: Review of Systems  Constitutional: Negative.   Eyes: Negative.   Respiratory: Negative.   Cardiovascular: Negative.   Gastrointestinal: Negative.   Genitourinary: Negative.   Musculoskeletal: Negative.   Skin: Negative.   Neurological: Positive for headaches.  Endo/Heme/Allergies: Negative.   Psychiatric/Behavioral: Positive for depression. The patient has insomnia.     Blood pressure 137/96, pulse 91, temperature 98.3 F (36.8 C), temperature source Oral, resp. rate 18, height 5\' 4"  (1.626 m), weight 112.038 kg (247 lb), SpO2 97 %.Body mass index is 42.38 kg/(m^2).  General Appearance: Well Groomed  Patent attorney::  Good  Speech:  Clear and Coherent  Volume:  Normal  Mood:  Dysphoric  Affect:  Congruent  Thought Process:  Linear  Orientation:  Full (Time, Place, and Person)  Thought Content:  Hallucinations: None  Suicidal Thoughts:  No  Homicidal Thoughts:  No  Memory:  Immediate;   Good Recent;   Good Remote;   Good  Judgement:  Good  Insight:  Good  Psychomotor Activity:  Decreased  Concentration:  Good  Recall:  Good  Fund of  Knowledge:Good  Language: Good  Akathisia:  No  Handed:    AIMS (if indicated):     Assets:  Manufacturing systems engineer Physical Health  ADL's:  Intact  Cognition: WNL  Sleep:  Number of Hours: 7.45   Treatment Plan Summary:  Bipolar d/o continue celexa 20 mg po q am and geodon 60 mg po qhs  Anxiety: started on klonopin 0.5 mg tid prn  Asthma: continue albuterol prn, singular and incruse daily  Allergies: continue flonase  Vit d def: continue calcium and vit d  Cardiovascular health: continue ASA 81 mg  Borderline DM: hemoglobin A1c 6. Will order low carb diet.  Precautions q 15 m checks  Hospitalization status continue voluntary admission  Diet: carb modified  Labs: will order prolactin.  Dispo once stable will return home with family  F/u: will continue to f/u with Dr Maryruth Bun  Collateral from Dr Maryruth Bun has been obtained. Records reviewed --pt f/u with [facility] for primary care.  Likely will d/c tomorrow  Jimmy Footman, MD 07/14/2015, 9:01 AM

## 2015-07-14 NOTE — Plan of Care (Signed)
Problem: Medication: Goal: Compliance with prescribed medication regimen will improve Outcome: Progressing Medications administered as ordered by the physician, medications Therapeutic Effects, SEs and Adverse effects discussed, questions encouraged; no PRN given, 15 minute checks maintained for safety, clinical and moral support provided, patient encouraged to continue to express feelings and demonstrate safe care. Patient remain free from harm, will continue to monitor.
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
## 2015-07-14 NOTE — [facility] Suicide Risk Assessment (Signed)
[facility] INPATIENT:  Family/Significant Other Suicide Prevention Education  Suicide Prevention Education:  Education Completed; Valentina ShaggyJenny Hunt [phone] (daughter in law) has been identified by the patient as the family member/significant other with whom the patient will be residing, and identified as the person(s) who will aid the patient in the event of a mental health crisis (suicidal ideations/suicide attempt).  With written consent from the patient, the family member/significant other has been provided the following suicide prevention education, prior to the and/or following the discharge of the patient.  The suicide prevention education provided includes the following:  Suicide risk factors  Suicide prevention and interventions  National Suicide Hotline telephone number  [facility] assessment telephone number  [facility] Emergency Assistance 911  [facility] and/or Residential Mobile Crisis Unit telephone number  Request made of family/significant other to:  Remove weapons (e.g., guns, rifles, knives), all items previously/currently identified as safety concern.    Remove drugs/medications (over-the-counter, prescriptions, illicit drugs), all items previously/currently identified as a safety concern.  The family member/significant other verbalizes understanding of the suicide prevention education information provided.  The family member/significant other agrees to remove the items of safety concern listed above.  Daisy Floroandace L Laker Thompson MSW, LCSWA  07/14/2015, 4:44 PM

## 2015-07-14 NOTE — Progress Notes (Addendum)
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

## 2015-07-15 MED ORDER — BUTALBITAL-APAP-CAFFEINE 50-325-40 MG PO TABS: 2.0000 | ORAL_TABLET | Freq: Four times a day (QID) | ORAL | Status: DC | PRN

## 2015-07-15 MED ORDER — CLONAZEPAM 0.5 MG PO TABS: 0.5000 mg | ORAL_TABLET | Freq: Three times a day (TID) | ORAL | Status: DC | PRN

## 2015-07-15 MED ORDER — CITALOPRAM HYDROBROMIDE 20 MG PO TABS: 20.0000 mg | ORAL_TABLET | Freq: Every day | ORAL | Status: DC

## 2015-07-15 NOTE — [facility] Suicide Risk Assessment (Signed)
[facility]   Principal Problem: Bipolar depression [facility](HCC) Discharge Diagnoses:  Patient Active Problem List   Diagnosis Date Noted  . Bipolar depression (HCC) [F31.30] 07/12/2015  . 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 AB (asthmatic bronchitis) [J45.909] 08/02/2014  . Hypercholesteremia [E78.00] 08/02/2014  . Suicidal ideation [R45.851] 08/02/2014  . Avitaminosis D [E55.9] 08/02/2014  . Borderline diabetes mellitus [R73.03] 08/02/2014  . Generalized hyperhidrosis [R61] 08/02/2014      Psychiatric Specialty Exam: ROS  Blood pressure 125/67, pulse 92, temperature 98.8 F (37.1 C), temperature source Oral, resp. rate 18, height 5\' 4"  (1.626 m), weight 112.038 kg (247 lb), SpO2 97 %.Body mass index is 42.38 kg/(m^2).                                                       Mental Status Per Nursing Assessment::   On Admission:     Demographic Factors:  Caucasian  Loss Factors: Financial problems/change in socioeconomic status  Historical Factors: NA  Risk Reduction Factors:   Responsible for children under 58 years of age, Sense of responsibility to family, Religious beliefs about death, Living with another person, especially a relative and Positive social support  Continued Clinical Symptoms:  Depression:   Insomnia Previous Psychiatric Diagnoses and Treatments  Cognitive Features That Contribute To Risk:  None    Suicide Risk:  Minimal: No identifiable suicidal ideation.  Patients presenting with no risk factors but with morbid ruminations; may be classified as minimal risk based on the severity of the depressive symptoms  Follow-up Information    Follow up with Margaretann LovelessJennifer M Burnette, PA-C.   Specialty:  Family Medicine   Why:  follow up with your primary care doctor   Contact information:   [address] [place] [address] [phone]       Follow up with Levora AngelKAPUR,AARTI KAMAL, MD  On 07/19/2015.   Specialty:  Psychiatry   Why:  Your hospital follow up appointment will be Tuesday, May 23rd at 9:30am.    Contact information:   [address] [place] [address] [phone]        Jimmy FootmanHernandez-Gonzalez,  Makayla Lanter, MD 07/15/2015, 11:54 AM

## 2015-07-15 NOTE — Tx Team (Signed)
Interdisciplinary Treatment Plan Update (Adult)  Date:  07/15/2015 Time Reviewed:  9:25 AM  Progress in Treatment: Attending groups: Yes. Participating in groups:  Yes. Taking medication as prescribed:  Yes. Tolerating medication:  Yes. Family/Significant othe contact made:  Yes, individual(s) contacted:  daughter in law Patient understands diagnosis:  Yes. Discussing patient identified problems/goals with staff:  Yes. Medical problems stabilized or resolved:  Yes. Denies suicidal/homicidal ideation: Yes. Issues/concerns per patient self-inventory:  Yes. 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   New goal(s): NA  Review of initial/current patient goals per problem list:   1.  Goal(s): Patient will participate in aftercare plan * Met:  * Target date: at discharge * As evidenced by: Patient will participate within aftercare plan AEB aftercare provider and housing plan at discharge being identified.   2.  Goal (s): Patient will exhibit decreased depressive symptoms and suicidal ideations. * Met:  *  Target date: at discharge * As evidenced by: Patient will utilize self rating of depression at 3 or below and demonstrate decreased signs of depression or be deemed stable for discharge by MD.  Attendees: Patient:  Joanna Bishop 5/19/20179:25 AM  Family:   5/19/20179:25 AM  Physician:  Dr. Jerilee Hoh   5/19/20179:25 AM  Nursing:   Floyde Parkins, RN   5/19/20179:25 AM  Case Manager:   5/19/20179:25 AM  Counselor:   5/19/20179:25 AM  Other:  Wray Kearns, [place]  5/19/20179:25 AM  Other:  Everitt Amber, LRT  5/19/20179:25 AM  Other:   5/19/20179:25 AM  Other:  5/19/20179:25 AM  Other:  5/19/20179:25 AM  Other:  5/19/20179:25 AM  Other:  5/19/20179:25 AM  Other:  5/19/20179:25 AM  Other:  5/19/20179:25 AM  Other:   5/19/20179:25 AM   Scribe for Treatment Team:   Wray Kearns MSW, [place] , 07/15/2015, 9:25 AM

## 2015-07-15 NOTE — Progress Notes (Signed)
D: Observed pt in dayroom interacting with peers. Patient alert and oriented x4. Patient denies SI/HI/AVH. Pt affect is appropriate. Pt talked about being excited to leave, and go home to family. Pt talked about stressors and learning here that "I need to take care of myself and ask for help a times." Pt had no complaints. A: Offered active listening and support. Provided therapeutic communication. Administered scheduled medications.  R: Pt pleasant and cooperative. Pt medication compliant. Will continue Q15 min. checks. Safety maintained.

## 2015-07-15 NOTE — Plan of Care (Signed)
Problem: [facility] Participation in Recreation Therapeutic Interventions Goal: STG-Patient will demonstrate improved self esteem by identif STG: Self-Esteem - Within 4 treatment sessions, patient will verbalize at least 5 positive affirmation statements in each of 2 treatment sessions to increase self-esteem post d/c.  Outcome: Completed/Met Date Met:  07/15/15 Treatment Session 2; Completed 2 out of 2: At approximately 12:15 pm, LRT met with patient in patient room. Patient verbalized 5 positive affirmation statements. Patient reported it felt "good". LRT encouraged patient to continue saying positive affirmation statements.   M , LRT/CTRS 05.19.17 12:39 pm Goal: STG-Other Recreation Therapy Goal (Specify) STG: Stress Management - Within 4 treatment sessions, patient will verbalize understanding of the stress management techniques in each of 2 treatment sessions to increase stress management skills post d/c.  Outcome: Completed/Met Date Met:  07/15/15 Treatment Session 2; Completed 2 out of 2: At approximately 12:15 pm, LRT met with patient in patient room. Patient reported she read over and practiced the stress management techniques. Patient verbalized understanding and reported the techniques were helpful. LRT encouraged patient to continue practicing the stress management techniques.   M , LRT/CTRS 05.19.17 12:41 pm     

## 2015-07-15 NOTE — Discharge Summary (Addendum)
Physician Discharge Summary Note  Patient:  Joanna Bishop is an 60 y.o., female MRN:  [id number] DOB:  05/10/1955 Patient phone:  [phone] (home)  Patient address:   [place] [address],  Total Time spent with patient: 30 minutes  Date of Admission:  07/12/2015 Date of Discharge: 07/15/15  Reason for Admission:  SI  Principal Problem: Bipolar depression [facility]) Discharge Diagnoses: Patient Active Problem List   Diagnosis Date Noted  . Bipolar depression ([place]) [F31.30] 07/12/2015  . 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 AB (asthmatic bronchitis) [J45.909] 08/02/2014  . Hypercholesteremia [E78.00] 08/02/2014  . Suicidal ideation [R45.851] 08/02/2014  . Avitaminosis D [E55.9] 08/02/2014  . Borderline diabetes mellitus [R73.03] 08/02/2014  . Generalized hyperhidrosis [R61] 08/02/2014   History of Present Illness:  Joanna Bishop is an 60 y.o. female. Who has been referred to the ED on 5/16 by her outpatient psychiatrist. Per the pt psyhiatrist (Dr. Nicolasa Ducking) pt shared that she wanted to go to sleep and didn't care if she woke up. Pt reports a history of Depression and Bipolar Disorder. Pt states that her husband has ALS and that she is his primary caregiver. Pt states that she often does not sleep due to worrying about her husband not being able to breath. Pt reports sleeping approximately 2 hours per night. She also has a daughter who is struggling with substance abuse and who reportedly overdosed on heroin this past weekend. Pt says her daughter is now missing.  Spoke with outpatient psychiatric Dr Nicolasa Ducking this morning. Pt treated for bipolar disorder. Has been under her care since December 2016. She went for her regular appointment yesterday and reported that she was feeling worse. She described herself as feeling overwhelmed and having suicidal thoughts. Dr Nicolasa Ducking refer her to the ER for eval and admission into our unit. Dr Nicolasa Ducking reports pt has done  well on citalopram and geodon.   Patient reports compliance with medications. Thinks her worsening mood is cause by all her current stressors. Says her husband just sign up (April) to receive benefits trough the [place]. They have applied for home health and home equipment (electric wheelchair).   Patient has been out of work on [employer] for several months, says she is suppose to return to work at [employer] h weeks. Patient says it will be impossible for her to do that at this time. She has apply for disability on the basis of bipolar d/o and past issues with her right leg (knee replacement).  Substance abuse history: Patient reports that she used to drink heavily until about 2001 but at that time stopped. She says she drinks very little now and not abusively and denies any other drug use. Pt does not smoke   Pt has shared that she has previously attempted suicide on one occasion. Pt most recent inpatient admission was at [facility] in 2001.   Associated Signs/Symptoms: Depression Symptoms: depressed mood, insomnia, hopelessness, recurrent thoughts of death, loss of energy/fatigue, (Hypo) Manic Symptoms: denies Anxiety Symptoms: Excessive Worry, Psychotic Symptoms: denies PTSD Symptoms: Negative   Total Time spent with patient: 1 hour  Past Psychiatric History: Long-standing problems with depression and has been diagnosed with bipolar disorder in the past. Recently has also been complaining of visual hallucinations but just shadows out of the corner of her eye. Has been treated with antidepressants and mood stabilizers. Currently taking Celexa and Geodon. Patient says she has a history of one suicide attempt as a teenager and  at least one previous psychiatric hospitalization several years ago.    Past Medical History: Overweight. Chronic COPD and asthma. Recently treated for bronchitis. Past Medical History  Diagnosis Date  . Depression   . Anxiety   . Asthma     Past  Surgical History  Procedure Laterality Date  . Cholecystectomy    . Abdominal hysterectomy    . Total knee arthroplasty Left   . Arthroplasty    . Femur fracture surgery     Family History:  Family History  Problem Relation Age of Onset  . Diabetes Mother   . COPD Mother   . Diabetes Father   . Coronary artery disease Father   . Coronary artery disease Maternal Grandmother   . Heart attack Maternal Grandmother   . Diabetes Paternal Grandmother    Family Psychiatric  History: denies  Social History:  Married. Husband apparently has ALS and is starting to gradually get sicker. This of course worries the patient. She also has some daughters who have major problems including substance abuse and she worries about that. Patient worked at [employer] but has been out of work for many months. Dr. Nicolasa Ducking urged the patient to go back to work recently and suspects that that urging may have also been part of what has stressed her out so much. Nevertheless they have major financial problems. History  Alcohol Use: Not on file     History  Drug Use Not on file    Social History   Social History  . Marital Status: Married    Spouse Name: N/A  . Number of Children: N/A  . Years of Education: N/A   Social History Main Topics  . Smoking status: Never Smoker   . Smokeless tobacco: Never Used  . Alcohol Use: None  . Drug Use: None  . Sexual Activity: Not Asked   Other Topics Concern  . None   Social History Narrative    Hospital Course:    Bipolar d/o continue celexa 20 mg po q am and geodon 60 mg po qhs  Anxiety: started on klonopin 0.5 mg tid prn  Asthma: continue albuterol prn, singular and incruse daily  Migraines the patient has been started on Fioricet when necessary.  Allergies: continue flonase  Vit d def: continue calcium and vit d  Cardiovascular health: continue ASA 81 mg  Borderline DM: hemoglobin A1c 6.  Dispo once stable will return home with family  today  F/u: will continue to f/u with Dr Nicolasa Ducking  Collateral from Dr Nicolasa Ducking has been obtained. Records reviewed --pt f/u with [facility] for primary care.  Patient reports doing much better. She denies hopelessness or helplessness. She denies thoughts of suicide or homicide. She denies auditory or visual hallucinations. She describes her mood as much improved. She is future oriented. She tells me that family has to step in and they have been helping taking care of her husband while she has been here. Her family members have offered to continue helping care for her husband.   This hospitalization was uneventful. The patient was cooperative at all times. She participated in programming. She was cooperative with the nursing staff. She had appropriate interactions with peers. There was no display of onset for disruptive behaviors. There was no need for seclusion, or restraint  The only medication change during this hospitalization was the addition of clonazepam to be used as needed. The day prior to discharge the patient complained of migraine headaches. She responded well to Fioricet  and requested a prescription of this medication upon discharge.  Physical Findings: AIMS: Facial and Oral Movements Muscles of Facial Expression: None, normal Lips and Perioral Area: None, normal Jaw: None, normal Tongue: None, normal,Extremity Movements Upper (arms, wrists, hands, fingers): None, normal, Trunk Movements Neck, shoulders, hips: None, normal, Overall Severity Severity of abnormal movements (highest score from questions above): None, normal Incapacitation due to abnormal movements: None, normal Patient's awareness of abnormal movements (rate only patient's report): No Awareness, Dental Status Current problems with teeth and/or dentures?: No Does patient usually wear dentures?: No  CIWA:    COWS:     Musculoskeletal: Strength & Muscle Tone: within normal limits Gait & Station:  normal Patient leans: N/A  Psychiatric Specialty Exam: Review of Systems  Constitutional: Negative.   HENT: Negative.   Eyes: Negative.   Respiratory: Negative.   Cardiovascular: Negative.   Gastrointestinal: Negative.   Genitourinary: Negative.   Musculoskeletal: Negative.   Skin: Negative.   Neurological: Negative.   Endo/Heme/Allergies: Negative.   Psychiatric/Behavioral: Positive for depression. Negative for suicidal ideas, hallucinations, memory loss and substance abuse. The patient is not nervous/anxious and does not have insomnia.     Blood pressure 125/67, pulse 92, temperature 98.8 F (37.1 C), temperature source Oral, resp. rate 18, height _0  (1.626 m), weight 112.038 kg (247 lb), SpO2 97 %.Body mass index is 42.38 kg/(m^2).  General Appearance: Well Groomed  Engineer, water::  Good  Speech:  Clear and Coherent  Volume:  Normal  Mood:  Euthymic  Affect:  Appropriate  Thought Process:  Logical  Orientation:  Full (Time, Place, and Person)  Thought Content:  Hallucinations: None  Suicidal Thoughts:  No  Homicidal Thoughts:  No  Memory:  Immediate;   Good Recent;   Good Remote;   Good  Judgement:  Good  Insight:  Good  Psychomotor Activity:  Normal  Concentration:  Good  Recall:  Good  Fund of Knowledge:Good  Language: Good  Akathisia:  No  Handed:    AIMS (if indicated):     Assets:  Communication Skills  ADL's:  Intact  Cognition: WNL  Sleep:  Number of Hours: 5.25   Have you used any form of tobacco in the last 30 days? (Cigarettes, Smokeless Tobacco, Cigars, and/or Pipes): No  Has this patient used any form of tobacco in the last 30 days? (Cigarettes, Smokeless Tobacco, Cigars, and/or Pipes) Yes, No  Blood Alcohol level:  Lab Results  Component Value Date   ETH <5 18/84/1660    Metabolic Disorder Labs:  Lab Results  Component Value Date   HGBA1C 6.0 07/12/2015   Lab Results  Component Value Date   PROLACTIN 124.3* 07/12/2015   Lab Results   Component Value Date   CHOL 165 07/12/2015   TRIG 367* 07/12/2015   HDL 36* 07/12/2015   CHOLHDL 4.6 07/12/2015   VLDL 73* 07/12/2015   LDLCALC 56 07/12/2015   LDLCALC 98 09/13/2014   Results for Joanna Bishop, Joanna Bishop (MRN [id number]) as of 07/15/2015 11:58  Ref. Range 07/12/2015 08:55 07/12/2015 10:04 07/12/2015 19:23 07/13/2015 21:03  Sodium Latest Ref Range: 135-145 mmol/L 139     Potassium Latest Ref Range: 3.5-5.1 mmol/L 4.3     Chloride Latest Ref Range: 101-111 mmol/L 109     CO2 Latest Ref Range: 22-32 mmol/L 22     BUN Latest Ref Range: 6-20 mg/dL 18     Creatinine Latest Ref Range: 0.44-1.00 mg/dL 0.91     Calcium Latest Ref Range: 8.9-10.3  mg/dL 9.4     EGFR (Non-African Amer.) Latest Ref Range: >60 mL/min >60     EGFR (African American) Latest Ref Range: >60 mL/min >60     Glucose Latest Ref Range: 65-99 mg/dL 154 (H)     Anion gap Latest Ref Range: 5-15  8     Alkaline Phosphatase Latest Ref Range: 38-126 U/L 82     Albumin Latest Ref Range: 3.5-5.0 g/dL 4.4     AST Latest Ref Range: 15-41 U/L 35     ALT Latest Ref Range: 14-54 U/L 51     Total Protein Latest Ref Range: 6.5-8.1 g/dL 7.4     Total Bilirubin Latest Ref Range: 0.3-1.2 mg/dL 0.9     Cholesterol Latest Ref Range: 0-200 mg/dL   165   Triglycerides Latest Ref Range: <150 mg/dL   367 (H)   HDL Cholesterol Latest Ref Range: >40 mg/dL   36 (L)   LDL (calc) Latest Ref Range: 0-99 mg/dL   56   VLDL Latest Ref Range: 0-40 mg/dL   73 (H)   Total CHOL/HDL Ratio Latest Units: RATIO   4.6   WBC Latest Ref Range: 3.6-11.0 K/uL 8.2     RBC Latest Ref Range: 3.80-5.20 MIL/uL 4.99     Hemoglobin Latest Ref Range: 12.0-16.0 g/dL 14.7     HCT Latest Ref Range: 35.0-47.0 % 42.7     MCV Latest Ref Range: 80.0-100.0 fL 85.7     MCH Latest Ref Range: 26.0-34.0 pg 29.5     MCHC Latest Ref Range: 32.0-36.0 g/dL 34.4     RDW Latest Ref Range: 11.5-14.5 % 13.4     Platelets Latest Ref Range: 150-440 K/uL 219     Acetaminophen  (Tylenol), S Latest Ref Range: 10-30 ug/mL <45 (L)     Salicylate Lvl Latest Ref Range: 2.8-30.0 mg/dL <4.0     Prolactin Latest Ref Range: 4.8-23.3 ng/mL   124.3 (H)   Hemoglobin A1C Latest Ref Range: 4.0-6.0 %   6.0   TSH Latest Ref Range: 0.350-4.500 uIU/mL   3.030   Alcohol, Ethyl (B) Latest Ref Range: <5 mg/dL <5     Amphetamines, Ur Screen Latest Ref Range: NONE DETECTED   NONE DETECTED    Barbiturates, Ur Screen Latest Ref Range: NONE DETECTED   NONE DETECTED    Benzodiazepine, Ur Scrn Latest Ref Range: NONE DETECTED   NONE DETECTED    Cocaine Metabolite,Ur [place] Latest Ref Range: NONE DETECTED   NONE DETECTED    Methadone Scn, Ur Latest Ref Range: NONE DETECTED   NONE DETECTED    MDMA (Ecstasy)Ur Screen Latest Ref Range: NONE DETECTED   NONE DETECTED    Cannabinoid 50 Ng, Ur [place] Latest Ref Range: NONE DETECTED   NONE DETECTED    Opiate, Ur Screen Latest Ref Range: NONE DETECTED   NONE DETECTED    Phencyclidine (PCP) Ur S Latest Ref Range: NONE DETECTED   NONE DETECTED    Tricyclic, Ur Screen Latest Ref Range: NONE DETECTED   NONE DETECTED     See Psychiatric Specialty Exam and Suicide Risk Assessment completed by Attending Physician prior to discharge.  Discharge destination:  Home  Is patient on multiple antipsychotic therapies at discharge:  No   Has Patient had three or more failed trials of antipsychotic monotherapy by history:  No  Recommended Plan for Multiple Antipsychotic Therapies: NA     Medication List    STOP taking these medications  MULTIPLE VITAMIN PO      TAKE these medications      Indication   albuterol 108 (90 Base) MCG/ACT inhaler  Commonly known as:  PROVENTIL HFA;VENTOLIN HFA  Inhale 2 puffs into the lungs every 4 (four) hours as needed for wheezing or shortness of breath. Every 4-6 hours as needed      aspirin 81 MG tablet  Take by mouth daily.      butalbital-acetaminophen-caffeine 50-325-40 MG tablet  Commonly known as:  FIORICET, ESGIC   Take 2 tablets by mouth every 6 (six) hours as needed for headache.      CALCIUM 500 PO  Take 1 tablet by mouth 2 (two) times daily.      citalopram 20 MG tablet  Commonly known as:  CELEXA  Take 1 tablet (20 mg total) by mouth daily.      clonazePAM 0.5 MG tablet  Commonly known as:  KLONOPIN  Take 1 tablet (0.5 mg total) by mouth 3 (three) times daily as needed (as needed for anxiety).      fluticasone 50 MCG/ACT nasal spray  Commonly known as:  FLONASE  Place 2 sprays into the nose daily.      GEODON 60 MG capsule  Generic drug:  ziprasidone  Take by mouth.      montelukast 10 MG tablet  Commonly known as:  SINGULAIR  Take 1 tablet (10 mg total) by mouth daily.      umeclidinium bromide 62.5 MCG/INH Aepb  Commonly known as:  INCRUSE ELLIPTA  Inhale 1 puff into the lungs daily.            Follow-up Information    Follow up with Mar Daring, PA-C.   Specialty:  Family Medicine   Why:  follow up with your primary care doctor   Contact information:   [place] [address] [phone]       Follow up with Jay Schlichter, MD On 07/19/2015.   Specialty:  Psychiatry   Why:  Your hospital follow up appointment will be Tuesday, May 23rd at 9:30am.    Contact information:   [place] [address] [phone]       >30 minutes. >50 % of the time was spent incoordination of care.   Signed: Hildred Priest, MD 07/15/2015, 11:56 AM

## 2015-07-15 NOTE — Progress Notes (Signed)
Recreation Therapy Notes  INPATIENT RECREATION TR PLAN  Patient Details Name: RAILEE BONILLAS MRN: [id number] DOB: [birth year] Today's Date: 07/15/2015  Rec Therapy Plan Is patient appropriate for Therapeutic Recreation?: Yes Treatment times per week: At least once a week TR Treatment/Interventions: 1:1 session, Group participation (Comment) (Appropriate participation in daily recreaitonal therapy tx)  Discharge Criteria Pt will be discharged from therapy if:: Treatment goals are met, Discharged Treatment plan/goals/alternatives discussed and agreed upon by:: Patient/family  Discharge Summary Short term goals set: See Care Plan Progress toward goals comments: One-to-one attended Which groups?: Self-esteem One-to-one attended: Self-esteem, stress management Reason goals not met: N/A Therapeutic equipment acquired: None Reason patient discharged from therapy: Discharge from hospital Pt/family agrees with progress & goals achieved: Yes Date patient discharged from therapy: 07/15/15   Leonette Monarch, LRT/CTRS 07/15/2015, 3:11 PM

## 2015-07-15 NOTE — Progress Notes (Signed)
Patient denies SI/HI, denies A/V hallucinations. Patient verbalizes understanding of discharge instructions, follow up care and prescriptions. Patient given all belongings from  locker. Patient escorted out by staff, transported by herself. 

## 2015-07-15 NOTE — Progress Notes (Signed)
  [facility]  Will you be returning to the same living situation after discharge:  Yes,  home  At discharge, do you have transportation home?: Yes,  family  Do you have the ability to pay for your medications: Yes,  insurance, income   Release of information consent forms completed and in the chart;  Patient's signature needed at discharge.  Patient to Follow up at: Follow-up Information    Follow up with Margaretann LovelessJennifer M Burnette, PA-C.   Specialty:  Family Medicine   Why:  follow up with your primary care doctor   Contact information:   [address] [place] [address] [phone]       Follow up with Levora AngelKAPUR,AARTI KAMAL, MD On 07/19/2015.   Specialty:  Psychiatry   Why:  Your hospital follow up appointment will be Tuesday, May 23rd at 9:30am.    Contact information:   [address] [place] [address] [phone]       Next level of care provider [facility] Link:no  Safety Planning and Suicide Prevention discussed: Yes,  with patient and daughter in law   Have you used any form of tobacco in the last 30 days? (Cigarettes, Smokeless Tobacco, Cigars, and/or Pipes): No  Has patient been referred to the Quitline?: N/A patient is not a smoker  Patient has been referred for addiction treatment: N/A  Rondall Allegraandace L Eila Runyan MSW, LCSWA  07/15/2015, 9:43 AM

## 2015-08-11 ENCOUNTER — Telehealth: Payer: Self-pay | Admitting: Physician Assistant

## 2015-08-11 ENCOUNTER — Encounter: Payer: Self-pay | Admitting: Physician Assistant

## 2015-08-11 ENCOUNTER — Ambulatory Visit (INDEPENDENT_AMBULATORY_CARE_PROVIDER_SITE_OTHER): Payer: BLUE CROSS/BLUE SHIELD | Admitting: Physician Assistant

## 2015-08-11 VITALS — BP 138/80 | HR 74 | Temp 98.1°F | Resp 16 | Wt 248.4 lb

## 2015-08-11 DIAGNOSIS — J4541 Moderate persistent asthma with (acute) exacerbation: Secondary | ICD-10-CM

## 2015-08-11 MED ORDER — AZITHROMYCIN 250 MG PO TABS: ORAL_TABLET | ORAL | Status: DC

## 2015-08-11 MED ORDER — PREDNISONE 10 MG PO TABS: ORAL_TABLET | ORAL | Status: DC

## 2015-08-11 NOTE — Patient Instructions (Signed)
Chronic Bronchitis Chronic bronchitis is a lasting inflammation of the bronchial tubes, which are the tubes that carry air into your lungs. This is inflammation that occurs:   On most days of the week.   For at least three months at a time.   Over a period of two years in a row. When the bronchial tubes are inflamed, they start to produce mucus. The inflammation and buildup of mucus make it more difficult to breathe. Chronic bronchitis is usually a permanent problem and is one type of chronic obstructive pulmonary disease (COPD). People with chronic bronchitis are at greater risk for getting repeated colds, or respiratory infections. CAUSES  Chronic bronchitis most often occurs in people who have:  Long-standing, severe asthma.  A history of smoking.  Asthma and who also smoke. SIGNS AND SYMPTOMS  Chronic bronchitis may cause the following:   A cough that brings up mucus (productive cough).  Shortness of breath.  Early morning headache.  Wheezing.  Chest discomfort.   Recurring respiratory infections. DIAGNOSIS  Your health care provider may confirm the diagnosis by:  Taking your medical history.  Performing a physical exam.  Taking a chest X-ray.   Performing pulmonary function tests. TREATMENT  Treatment involves controlling symptoms with medicines, oxygen therapy, or making lifestyle changes, such as exercising and eating a healthy, well-balanced diet. Medicines could include:  Inhalers to improve air flow in and out of your lungs.  Antibiotics to treat bacterial infections, such as pneumonia, sinus infections, and acute bronchitis. As a preventative measure, your health care provider may recommend routine vaccinations for influenza and pneumonia. This is to prevent infection and hospitalization since you may be more at risk for these types of infections.  HOME CARE INSTRUCTIONS  Take medicines only as directed by your health care provider.   If you smoke  cigarettes, chew tobacco, or use electronic cigarettes, quit. If you need help quitting, ask your health care provider.  Avoid pollen, dust, animal dander, molds, smoke, and other things that cause shortness of breath or wheezing attacks.  Talk to your health care provider about possible exercise routines. Regular exercise is very important to help you feel better.  If you are prescribed oxygen use at home follow these guidelines:  Never smoke while using oxygen. Oxygen does not burn or explode, but flammable materials will burn faster in the presence of oxygen.  Keep a fire extinguisher close by. Let your fire department know that you have oxygen in your home.  Warn visitors not to smoke near you when you are using oxygen. Put up "no smoking" signs in your home where you most often use the oxygen.  Regularly test your smoke detectors at home to make sure they work. If you receive care in your home from a nurse or other health care provider, he or she may also check to make sure your smoke detectors work.  Ask your health care provider whether you would benefit from a pulmonary rehabilitation program.  Do not wait to get medical care if you have any concerning symptoms. Delays could cause permanent injury and may be life threatening. SEEK MEDICAL CARE IF:  You have increased coughing or shortness of breath or both.  You have muscle aches.  You have chest pain.  Your mucus gets thicker.  Your mucus changes from clear or white to yellow, green, gray, or bloody. SEEK IMMEDIATE MEDICAL CARE IF:  Your usual medicines do not stop your wheezing.   You have increased difficulty breathing.     You have any problems with the medicine you are taking, such as a rash, itching, swelling, or trouble breathing. MAKE SURE YOU:   Understand these instructions.  Will watch your condition.  Will get help right away if you are not doing well or get worse.   This information is not intended to  replace advice given to you by your health care provider. Make sure you discuss any questions you have with your health care provider.   Document Released: 11/30/2005 Document Revised: 03/05/2014 Document Reviewed: 03/23/2013 Elsevier Interactive Patient Education 2016 Elsevier Inc.  

## 2015-08-11 NOTE — Progress Notes (Signed)
Patient: Joanna Bishop Female    DOB: 10/19/1955   60 y.o.   MRN: [id number] Visit Date: 08/11/2015  Today's Provider: Margaretann Loveless, PA-C   Chief Complaint  Patient presents with  . Asthma   Subjective:    HPI Asthma Flare: She has previously been evaluated here for asthma/bronchitis and now presents with an asthma exacerbation. This exacerbation began 8 days ago.  Associated symptoms include nonproductive cough, sneezing, wheezing and SOB,chest tightness.Suspected precipitants include no identifiable factor.  Symptoms have been gradually worsening since their onset.She reports that walking is getting more difficult, "distances are getting shorter and shorter". She has treated this current exacerbation Albuterol inhaler, Flonase, and montelukast. She denies no chest pain, leg swelling,fever,runny nose,or congestion.  Her husband's ALS is progressing and they were told he may have 6 month prognosis. The [facility] assistance is finally coming through, however, and she feels less stressed and calm about her financial situation.    Allergies  Allergen Reactions  . Penicillins    Current Meds  Medication Sig  . albuterol (PROVENTIL HFA;VENTOLIN HFA) 108 (90 BASE) MCG/ACT inhaler Inhale 2 puffs into the lungs every 4 (four) hours as needed for wheezing or shortness of breath. Every 4-6 hours as needed  . aspirin 81 MG tablet Take by mouth daily.  . butalbital-acetaminophen-caffeine (FIORICET, ESGIC) 50-325-40 MG tablet Take 2 tablets by mouth every 6 (six) hours as needed for headache.  . Calcium-Magnesium-Vitamin D (CALCIUM 500 PO) Take 1 tablet by mouth 2 (two) times daily.  . citalopram (CELEXA) 20 MG tablet Take 1 tablet (20 mg total) by mouth daily.  . clonazePAM (KLONOPIN) 0.5 MG tablet Take 1 tablet (0.5 mg total) by mouth 3 (three) times daily as needed (as needed for anxiety).  . fluticasone (FLONASE) 50 MCG/ACT nasal spray Place 2 sprays into the nose daily.  . montelukast  (SINGULAIR) 10 MG tablet Take 1 tablet (10 mg total) by mouth daily.  Marland Kitchen Umeclidinium Bromide (INCRUSE ELLIPTA) 62.5 MCG/INH AEPB Inhale 1 puff into the lungs daily.  . ziprasidone (GEODON) 60 MG capsule Take by mouth.    Review of Systems  Constitutional: Positive for fatigue. Negative for fever (low grade initially) and chills.  HENT: Positive for congestion, postnasal drip and rhinorrhea. Negative for ear pain, sinus pressure, sneezing, sore throat (in the beginning none now) and trouble swallowing.   Respiratory: Positive for cough, chest tightness, shortness of breath and wheezing.   Cardiovascular: Negative for chest pain, palpitations and leg swelling.  Gastrointestinal: Negative for nausea, vomiting and abdominal pain.  Neurological: Negative for dizziness and headaches.    Social History  Substance Use Topics  . Smoking status: Never Smoker   . Smokeless tobacco: Never Used  . Alcohol Use: Not on file   Objective:   BP 138/80 mmHg  Pulse 74  Temp(Src) 98.1 F (36.7 C) (Oral)  Resp 16  Wt 248 lb 6.4 oz (112.674 kg)  SpO2 97%  Physical Exam  Constitutional: She appears well-developed and well-nourished. No distress.  HENT:  Head: Normocephalic and atraumatic.  Right Ear: Hearing, tympanic membrane, external ear and ear canal normal.  Left Ear: Hearing, tympanic membrane, external ear and ear canal normal.  Nose: Nose normal. Right sinus exhibits no maxillary sinus tenderness and no frontal sinus tenderness. Left sinus exhibits no maxillary sinus tenderness and no frontal sinus tenderness.  Mouth/Throat: Uvula is midline, oropharynx is clear and moist and mucous membranes are normal. No oropharyngeal exudate,  posterior oropharyngeal edema or posterior oropharyngeal erythema.  Eyes: Conjunctivae are normal. Pupils are equal, round, and reactive to light. Right eye exhibits no discharge. Left eye exhibits no discharge. No scleral icterus.  Neck: Normal range of motion. Neck  supple. No tracheal deviation present. No thyromegaly present.  Cardiovascular: Normal rate, regular rhythm and normal heart sounds.  Exam reveals no gallop and no friction rub.   No murmur heard. Pulmonary/Chest: Effort normal. No stridor. No respiratory distress. She has wheezes. She has no rales. She exhibits no tenderness.  Lymphadenopathy:    She has no cervical adenopathy.  Skin: Skin is warm and dry. She is not diaphoretic.  Vitals reviewed.       Assessment & Plan:     1. AB (asthmatic bronchitis), moderate persistent, with acute exacerbation Worsening symptoms that is not responding to other treatment. Will give zpak and prednisone as below. She is to call if symptoms worsen. She will f/u in approx 6 weeks for her CPE. - azithromycin (ZITHROMAX) 250 MG tablet; Take 2 tablets PO on day one, and one tablet PO daily thereafter until completed.  Dispense: 6 tablet; Refill: 1 - predniSONE (DELTASONE) 10 MG tablet; Take 6 tabs PO on day 1&2, 5 tabs PO on day 3&4, 4 tabs PO on day 5&6, 3 tabs PO on day 7&8, 2 tabs PO on day 9&10, 1 tab PO on day 11&12.  Dispense: 42 tablet; Refill: 0       Margaretann LovelessJennifer M Burnette, PA-C  [facility]

## 2015-08-11 NOTE — Telephone Encounter (Signed)
Pt states she feels like she's not getting any better from the medication that was prescribed to her, pt would like to know if Dr will call in something else or pt states if she needs to make a appointment she will. Please let pt know which one to do. Thanks CC

## 2015-08-11 NOTE — Telephone Encounter (Signed)
Spoke with Rinaldo Cloudamela and she reports that she is not feeling good and if it was possible to be seen or to get something prescribed buth feel s that she has bronchitis or Asthma flare. Scheduled appointment at 2 pm today for a 30 minute office visit.  Thanks,  -Andersyn Fragoso

## 2015-09-13 ENCOUNTER — Encounter: Payer: BLUE CROSS/BLUE SHIELD | Admitting: Physician Assistant

## 2015-09-22 ENCOUNTER — Encounter: Payer: BLUE CROSS/BLUE SHIELD | Admitting: Physician Assistant

## 2015-10-14 ENCOUNTER — Ambulatory Visit (INDEPENDENT_AMBULATORY_CARE_PROVIDER_SITE_OTHER): Payer: Medicaid Other | Admitting: Physician Assistant

## 2015-10-14 ENCOUNTER — Encounter: Payer: Self-pay | Admitting: Physician Assistant

## 2015-10-14 VITALS — BP 130/80 | HR 78 | Temp 97.9°F | Resp 16 | Ht 64.5 in | Wt 257.6 lb

## 2015-10-14 DIAGNOSIS — F319 Bipolar disorder, unspecified: Secondary | ICD-10-CM

## 2015-10-14 DIAGNOSIS — E119 Type 2 diabetes mellitus without complications: Secondary | ICD-10-CM | POA: Diagnosis not present

## 2015-10-14 DIAGNOSIS — E559 Vitamin D deficiency, unspecified: Secondary | ICD-10-CM | POA: Diagnosis not present

## 2015-10-14 DIAGNOSIS — Z1239 Encounter for other screening for malignant neoplasm of breast: Secondary | ICD-10-CM | POA: Diagnosis not present

## 2015-10-14 DIAGNOSIS — G43109 Migraine with aura, not intractable, without status migrainosus: Secondary | ICD-10-CM | POA: Diagnosis not present

## 2015-10-14 DIAGNOSIS — Z1159 Encounter for screening for other viral diseases: Secondary | ICD-10-CM | POA: Diagnosis not present

## 2015-10-14 DIAGNOSIS — F3130 Bipolar disorder, current episode depressed, mild or moderate severity, unspecified: Secondary | ICD-10-CM

## 2015-10-14 DIAGNOSIS — R7303 Prediabetes: Secondary | ICD-10-CM | POA: Diagnosis not present

## 2015-10-14 DIAGNOSIS — Z124 Encounter for screening for malignant neoplasm of cervix: Secondary | ICD-10-CM

## 2015-10-14 DIAGNOSIS — G5602 Carpal tunnel syndrome, left upper limb: Secondary | ICD-10-CM | POA: Diagnosis not present

## 2015-10-14 DIAGNOSIS — E7800 Pure hypercholesterolemia, unspecified: Secondary | ICD-10-CM

## 2015-10-14 DIAGNOSIS — Z0000 Encounter for general adult medical examination without abnormal findings: Secondary | ICD-10-CM | POA: Diagnosis not present

## 2015-10-14 DIAGNOSIS — Z1211 Encounter for screening for malignant neoplasm of colon: Secondary | ICD-10-CM | POA: Diagnosis not present

## 2015-10-14 LAB — IFOBT (OCCULT BLOOD): IFOBT: NEGATIVE

## 2015-10-14 MED ORDER — BUTALBITAL-APAP-CAFF-COD 50-325-40-30 MG PO CAPS: 1.0000 | ORAL_CAPSULE | ORAL | 0 refills | Status: DC | PRN

## 2015-10-14 NOTE — Patient Instructions (Signed)

## 2015-10-14 NOTE — Progress Notes (Signed)
Patient: Joanna Bishop, Female    DOB: 26-Jan-1956, 60 y.o.   MRN: [id number] Visit Date: 10/14/2015  Today's Provider: Margaretann Loveless, PA-C   Chief Complaint  Patient presents with  . Annual Exam   Subjective:    Annual physical exam Joanna Bishop is a 60 y.o. female who presents today for health maintenance and complete physical. She feels fairly well. She reports exercising none. She reports she is sleeping fairly well.  She is concern about some tingling on left hand. This occurs at any time but does bother her at night. She has been having to use that hand more with assisting her husband and lifting him. Reports numbness in middle finger that radiates to ring finger, index finger and little finger. When it is sharp it will also radiate up her forearm. Denies any weakness. -----------------------------------------------------------------   Review of Systems  Constitutional: Positive for activity change and fatigue.       Crying and Irritable  HENT: Positive for ear pain, mouth sores, tinnitus and trouble swallowing.   Respiratory: Positive for shortness of breath and wheezing.   Cardiovascular: Positive for chest pain and leg swelling.  Endocrine: Positive for polydipsia and polyuria.  Genitourinary: Positive for enuresis, frequency and urgency.  Musculoskeletal: Positive for arthralgias, gait problem, joint swelling, myalgias and neck pain.  Neurological: Positive for light-headedness, numbness and headaches.  Psychiatric/Behavioral: Positive for decreased concentration.  These issues all are chronic with exception of left hand pain/numbness  Social History      She  reports that she has never smoked. She has never used smokeless tobacco. She reports that she drinks alcohol.       Social History   Social History  . Marital status: Married    Spouse name: N/A  . Number of children: N/A  . Years of education: N/A   Social History Main Topics  . Smoking  status: Never Smoker  . Smokeless tobacco: Never Used  . Alcohol use 0.0 oz/week     Comment: 1/2 ca of beer every 2 weeks  . Drug use: Unknown  . Sexual activity: Not Currently   Other Topics Concern  . None   Social History Narrative  . None    Past Medical History:  Diagnosis Date  . Anxiety   . Asthma   . Depression      Patient Active Problem List   Diagnosis Date Noted  . Carpal tunnel syndrome of left wrist 10/14/2015  . Bipolar depression (HCC) 07/12/2015  . 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 AB (asthmatic bronchitis) 08/02/2014  . Hypercholesteremia 08/02/2014  . Suicidal ideation 08/02/2014  . Avitaminosis D 08/02/2014  . Borderline diabetes mellitus 08/02/2014  . Generalized hyperhidrosis 08/02/2014  . Airway hyperreactivity 08/02/2014    Past Surgical History:  Procedure Laterality Date  . ABDOMINAL HYSTERECTOMY    . ARTHROPLASTY    . CHOLECYSTECTOMY    . FEMUR FRACTURE SURGERY    . TOTAL KNEE ARTHROPLASTY Left     Family History        Family Status  Relation Status  . Mother Deceased  . Father Deceased  . Maternal Grandmother Deceased  . Paternal Grandmother Deceased        Her family history includes COPD in her mother; Coronary artery disease in her father and maternal grandmother; Diabetes in her father, mother, and paternal grandmother; Heart attack in her maternal grandmother.    Allergies  Allergen Reactions  .  Penicillins     Current Meds  Medication Sig  . albuterol (PROVENTIL HFA;VENTOLIN HFA) 108 (90 BASE) MCG/ACT inhaler Inhale 2 puffs into the lungs every 4 (four) hours as needed for wheezing or shortness of breath. Every 4-6 hours as needed  . aspirin 81 MG tablet Take by mouth daily.  . Calcium-Magnesium-Vitamin D (CALCIUM 500 PO) Take 1 tablet by mouth 2 (two) times daily.  . fluticasone (FLONASE) 50 MCG/ACT nasal spray Place 2 sprays into the nose daily.  . montelukast (SINGULAIR) 10 MG tablet Take 1 tablet  (10 mg total) by mouth daily.  Marland Kitchen. Umeclidinium Bromide (INCRUSE ELLIPTA) 62.5 MCG/INH AEPB Inhale 1 puff into the lungs daily.    Patient Care Team: Margaretann LovelessJennifer M Burnette, PA-C as PCP - General (Physician Assistant)     Objective:   Vitals: BP 130/80 (BP Location: Right Arm, Patient Position: Sitting, Cuff Size: Large)   Pulse 78   Temp 97.9 F (36.6 C) (Oral)   Resp 16   Ht 5' 4.5" (1.638 m)   Wt 257 lb 9.6 oz (116.8 kg)   BMI 43.53 kg/m    Physical Exam  Constitutional: She is oriented to person, place, and time. She appears well-developed and well-nourished. No distress.  HENT:  Head: Normocephalic and atraumatic.  Right Ear: Hearing, tympanic membrane, external ear and ear canal normal.  Left Ear: Hearing, tympanic membrane, external ear and ear canal normal.  Nose: Nose normal.  Mouth/Throat: Uvula is midline, oropharynx is clear and moist and mucous membranes are normal. No oropharyngeal exudate.  Eyes: Conjunctivae and EOM are normal. Pupils are equal, round, and reactive to light. Right eye exhibits no discharge. Left eye exhibits no discharge. No scleral icterus.  Neck: Normal range of motion. Neck supple. No JVD present. Carotid bruit is not present. No tracheal deviation present. No thyromegaly present.  Cardiovascular: Normal rate, regular rhythm, normal heart sounds and intact distal pulses.  Exam reveals no gallop and no friction rub.   No murmur heard. Pulmonary/Chest: Effort normal and breath sounds normal. No respiratory distress. She has no wheezes. She has no rales. She exhibits no tenderness. Right breast exhibits no inverted nipple, no mass, no nipple discharge, no skin change and no tenderness. Left breast exhibits no inverted nipple, no mass, no nipple discharge, no skin change and no tenderness. Breasts are symmetrical.  Abdominal: Soft. Bowel sounds are normal. She exhibits no distension and no mass. There is no tenderness. There is no rebound and no guarding.  Hernia confirmed negative in the right inguinal area and confirmed negative in the left inguinal area.  Genitourinary: Rectum normal and vagina normal. Rectal exam shows no external hemorrhoid (skin tag), no internal hemorrhoid, no fissure, no mass, no tenderness, anal tone normal and guaiac negative stool. No breast swelling, tenderness, discharge or bleeding. Pelvic exam was performed with patient supine. There is no rash, tenderness, lesion or injury on the right labia. There is no rash, tenderness, lesion or injury on the left labia. Right adnexum displays no mass, no tenderness and no fullness. Left adnexum displays no mass, no tenderness and no fullness. No erythema, tenderness or bleeding in the vagina. No signs of injury around the vagina. No vaginal discharge found.  Genitourinary Comments: Surgically absent  Musculoskeletal: Normal range of motion. She exhibits no edema or tenderness.  Lymphadenopathy:    She has no cervical adenopathy.       Right: No inguinal adenopathy present.       Left: No inguinal  adenopathy present.  Neurological: She is alert and oriented to person, place, and time. She has normal reflexes. No cranial nerve deficit. Coordination normal.  Skin: Skin is warm and dry. No rash noted. She is not diaphoretic.  Psychiatric: She has a normal mood and affect. Her behavior is normal. Judgment and thought content normal.  Vitals reviewed.  Depression Screen PHQ 2/9 Scores 12/09/2014  PHQ - 2 Score 6  PHQ- 9 Score 24    Assessment & Plan:     Routine Health Maintenance and Physical Exam  Exercise Activities and Dietary recommendations Goals    None      Immunization History  Administered Date(s) Administered  . Pneumococcal Polysaccharide-23 07/30/2012  . Tdap 07/28/2009    Health Maintenance  Topic Date Due  . Hepatitis C Screening  1955-08-08  . HIV Screening  05/03/1972  . PAP SMEAR  05/04/1978  . MAMMOGRAM  05/04/2007  . COLONOSCOPY  05/04/2007    . INFLUENZA VACCINE  09/27/2015  . TETANUS/TDAP  07/29/2019      Discussed health benefits of physical activity, and encouraged her to engage in regular exercise appropriate for her age and condition.    1. Annual physical exam Normal physical exam today. Will check labs as below and f/u pending lab results. If labs are stable and WNL she will not need to have these rechecked for one year at her next annual physical exam. She is to call the office in the meantime if she has any acute issue, questions or concerns. - CBC with Differential/Platelet - TSH  2. Screening for breast cancer Breast exam today was normal. There is no family history of breast cancer. She does perform regular self breast exams. Mammogram was ordered as below. [facility] patient so she may schedule her mammogram at her convenience. - MM DIGITAL SCREENING BILATERAL; Future  3. Screening for cervical cancer Pap collected today. Will send as below and f/u pending results. - Pap IG and HPV (high risk) DNA detection  4. Colon cancer screening Fecal occult in office was negative today. Patient has never had colonoscopy and does not think she will have time with her Husband's medical issues at this time. Is willing to have cologuard testing done. Cologuard ordered as below. - Cologuard - IFOBT POC (occult bld, rslt in office)  5. Hypercholesteremia Will check labs as below and f/u pending results. - Lipid panel  6. Avitaminosis D Will check labs as below and f/u pending results. - CBC with Differential/Platelet - Vitamin D (25 hydroxy)  7. Borderline diabetes mellitus Will check labs as below and f/u pending results. - Comprehensive metabolic panel - Hemoglobin A1c  8. Migraine with aura and without status migrainosus, not intractable Occasional migraine with aura. None currently. Diagnosis pulled for medication refill. - butalbital-acetaminophen-caffeine (FIORICET WITH  CODEINE) 50-325-40-30 MG capsule; Take 1 capsule by mouth every 4 (four) hours as needed for headache.  Dispense: 30 capsule; Refill: 0  9. Carpal tunnel syndrome of left wrist I do believe she has carpal tunnel of the left wrist. Discussed use of NSAIDs on bad days, using a wrist brace especially at night, weight loss. If it worsens and she starts noticing grip strength decrease she is to call the office. Unable to f/u with ortho or seek treatment at this time due to not being able to leave husband alone and not having someone to help her and him on same day if she were required to have  surgery, nor would she be able to care for him following surgery.   10. Need for hepatitis C screening test - Hepatitis C Antibody  11. Bipolar depression (HCC) Pulled diagnosis for note. Patient states she is doing well currently. She had been hospitalized in 06/2015 for suicidal ideations without voicing plan of safety. Per records she should be on geodon 60mg  and citalopram 20mg  but patient states she is taking depakote 500mg  2 tabs at bedtime and benxtropine 0.5mg  1 tab BID. She was previously followed by Dr. Maryruth Bun but has not seen her since hospitalization.   --------------------------------------------------------------------    Margaretann Loveless, PA-C  [facility]

## 2015-10-15 LAB — CBC WITH DIFFERENTIAL/PLATELET
BASOS ABS: 0.1 10*3/uL (ref 0.0–0.2)
Basos: 1 %
EOS (ABSOLUTE): 0.3 10*3/uL (ref 0.0–0.4)
Eos: 5 %
HEMATOCRIT: 40.4 % (ref 34.0–46.6)
Hemoglobin: 13.7 g/dL (ref 11.1–15.9)
Immature Grans (Abs): 0 10*3/uL (ref 0.0–0.1)
Immature Granulocytes: 1 %
LYMPHS ABS: 2.1 10*3/uL (ref 0.7–3.1)
Lymphs: 31 %
MCH: 30.1 pg (ref 26.6–33.0)
MCHC: 33.9 g/dL (ref 31.5–35.7)
MCV: 89 fL (ref 79–97)
MONOS ABS: 0.5 10*3/uL (ref 0.1–0.9)
Monocytes: 8 %
Neutrophils Absolute: 3.6 10*3/uL (ref 1.4–7.0)
Neutrophils: 54 %
Platelets: 226 10*3/uL (ref 150–379)
RBC: 4.55 x10E6/uL (ref 3.77–5.28)
RDW: 13.7 % (ref 12.3–15.4)
WBC: 6.7 10*3/uL (ref 3.4–10.8)

## 2015-10-15 LAB — LIPID PANEL
Chol/HDL Ratio: 4.3 ratio units (ref 0.0–4.4)
Cholesterol, Total: 165 mg/dL (ref 100–199)
HDL: 38 mg/dL — AB (ref >39)
LDL Calculated: 74 mg/dL (ref 0–99)
Triglycerides: 263 mg/dL — ABNORMAL HIGH (ref 0–149)
VLDL Cholesterol Cal: 53 mg/dL — ABNORMAL HIGH (ref 5–40)

## 2015-10-15 LAB — COMPREHENSIVE METABOLIC PANEL
ALK PHOS: 91 IU/L (ref 39–117)
ALT: 53 IU/L — ABNORMAL HIGH (ref 0–32)
AST: 31 IU/L (ref 0–40)
Albumin/Globulin Ratio: 2 (ref 1.2–2.2)
Albumin: 4.5 g/dL (ref 3.5–5.5)
BUN/Creatinine Ratio: 29 — ABNORMAL HIGH (ref 9–23)
BUN: 24 mg/dL (ref 6–24)
Bilirubin Total: 0.6 mg/dL (ref 0.0–1.2)
CHLORIDE: 102 mmol/L (ref 96–106)
CO2: 21 mmol/L (ref 18–29)
CREATININE: 0.84 mg/dL (ref 0.57–1.00)
Calcium: 9.2 mg/dL (ref 8.7–10.2)
GFR calc Af Amer: 89 mL/min/{1.73_m2} (ref >59)
GFR calc non Af Amer: 77 mL/min/{1.73_m2} (ref >59)
GLOBULIN, TOTAL: 2.2 g/dL (ref 1.5–4.5)
Glucose: 133 mg/dL — ABNORMAL HIGH (ref 65–99)
POTASSIUM: 4.8 mmol/L (ref 3.5–5.2)
SODIUM: 142 mmol/L (ref 134–144)
Total Protein: 6.7 g/dL (ref 6.0–8.5)

## 2015-10-15 LAB — HEMOGLOBIN A1C
ESTIMATED AVERAGE GLUCOSE: 148 mg/dL
HEMOGLOBIN A1C: 6.8 % — AB (ref 4.8–5.6)

## 2015-10-15 LAB — VITAMIN D 25 HYDROXY (VIT D DEFICIENCY, FRACTURES): VIT D 25 HYDROXY: 29 ng/mL — AB (ref 30.0–100.0)

## 2015-10-15 LAB — HEPATITIS C ANTIBODY

## 2015-10-15 LAB — TSH: TSH: 2.51 u[IU]/mL (ref 0.450–4.500)

## 2015-10-17 ENCOUNTER — Telehealth: Payer: Self-pay | Admitting: Physician Assistant

## 2015-10-17 DIAGNOSIS — E1165 Type 2 diabetes mellitus with hyperglycemia: Secondary | ICD-10-CM | POA: Insufficient documentation

## 2015-10-17 DIAGNOSIS — E119 Type 2 diabetes mellitus without complications: Secondary | ICD-10-CM | POA: Insufficient documentation

## 2015-10-17 DIAGNOSIS — E1122 Type 2 diabetes mellitus with diabetic chronic kidney disease: Secondary | ICD-10-CM | POA: Insufficient documentation

## 2015-10-17 MED ORDER — METFORMIN HCL 500 MG PO TABS: 500.0000 mg | ORAL_TABLET | Freq: Two times a day (BID) | ORAL | 0 refills | Status: DC

## 2015-10-17 NOTE — Telephone Encounter (Signed)
-----   Message from Margaretann LovelessJennifer M Burnette, [place]PA-C sent at 10/17/2015 12:39 PM EDT ----- HgBA1c has increased to diabetic range at 6.8. Cholesterol is stable. Vit D is borderline low at 29.0. I do recommend increasing vit D supplement to 1200-2000IU daily of OTC Vit D3. Also recommend starting metformin to lower blood sugar to prevent end organ damage. All other labs are stable. Will recheck HgBA1c in 3 months.

## 2015-10-17 NOTE — Telephone Encounter (Signed)
LMTCB  Thanks,  -Menno Vanbergen 

## 2015-10-17 NOTE — Addendum Note (Signed)
Addended by: Margaretann LovelessBURNETTE, Krystie Leiter M on: 10/17/2015 12:40 PM   Modules accepted: Orders

## 2015-10-17 NOTE — Telephone Encounter (Signed)
Patient advised as directed below.  Thanks,  -Deosha Werden 

## 2015-10-17 NOTE — Telephone Encounter (Signed)
Order for cologuard faxed to [facility] °

## 2015-10-18 ENCOUNTER — Emergency Department
Admission: EM | Admit: 2015-10-18 | Discharge: 2015-10-18 | Disposition: A | Discharge status: Home / Self Care | Payer: BLUE CROSS/BLUE SHIELD | Attending: Emergency Medicine | Admitting: Emergency Medicine

## 2015-10-18 ENCOUNTER — Emergency Department: Payer: BLUE CROSS/BLUE SHIELD

## 2015-10-18 ENCOUNTER — Encounter: Payer: Self-pay | Admitting: Emergency Medicine

## 2015-10-18 DIAGNOSIS — E119 Type 2 diabetes mellitus without complications: Secondary | ICD-10-CM | POA: Diagnosis not present

## 2015-10-18 DIAGNOSIS — M25561 Pain in right knee: Secondary | ICD-10-CM | POA: Diagnosis present

## 2015-10-18 DIAGNOSIS — Z7984 Long term (current) use of oral hypoglycemic drugs: Secondary | ICD-10-CM | POA: Diagnosis not present

## 2015-10-18 DIAGNOSIS — J45909 Unspecified asthma, uncomplicated: Secondary | ICD-10-CM | POA: Insufficient documentation

## 2015-10-18 DIAGNOSIS — Z7982 Long term (current) use of aspirin: Secondary | ICD-10-CM | POA: Insufficient documentation

## 2015-10-18 HISTORY — DX: Type 2 diabetes mellitus without complications: E11.9

## 2015-10-18 LAB — GLUCOSE, CAPILLARY: Glucose-Capillary: 135 mg/dL — ABNORMAL HIGH (ref 65–99)

## 2015-10-18 MED ORDER — HYDROMORPHONE HCL 2 MG PO TABS
2.0000 mg | ORAL_TABLET | Freq: Once | ORAL | Status: AC
  Administered 2015-10-18: 2 mg via ORAL
  Filled 2015-10-18: qty 1

## 2015-10-18 MED ORDER — HYDROCODONE-ACETAMINOPHEN 5-325 MG PO TABS: 1.0000 | ORAL_TABLET | Freq: Four times a day (QID) | ORAL | 0 refills | Status: DC | PRN

## 2015-10-18 NOTE — ED Provider Notes (Signed)
Time Seen: Approximately 1923  I have reviewed the triage notes  Chief Complaint: Leg Swelling   History of Present Illness: Joanna Bishop is a 60 y.o. female who presents with some acute onset of right lower extremity swelling and discomfort focus more on the right knee region. Patient states she's had the pain over the last 3 days. Pain is worse with ambulation and she's noticed some swelling in comparison to her left leg. She has no history of deep venous thrombosis or pulmonary embolism. 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   Past Medical History:  Diagnosis Date  . Anxiety   . Asthma   . Depression   . Diabetes mellitus without complication [facility])     Patient Active Problem List   Diagnosis Date Noted  . Type 2 diabetes mellitus without complication, without long-term current use of insulin (HCC) 10/17/2015  . Carpal tunnel syndrome of left wrist 10/14/2015  . Bipolar depression (HCC) 07/12/2015  . 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 AB (asthmatic bronchitis) 08/02/2014  . Hypercholesteremia 08/02/2014  . Suicidal ideation 08/02/2014  . Avitaminosis D 08/02/2014  . Generalized hyperhidrosis 08/02/2014  . Airway hyperreactivity 08/02/2014    Past Surgical History:  Procedure Laterality Date  . ABDOMINAL HYSTERECTOMY    . ARTHROPLASTY    . CHOLECYSTECTOMY    . FEMUR FRACTURE SURGERY    . TOTAL KNEE ARTHROPLASTY Left     Past Surgical History:  Procedure Laterality Date  . ABDOMINAL HYSTERECTOMY    . ARTHROPLASTY    . CHOLECYSTECTOMY    . FEMUR FRACTURE SURGERY    . TOTAL KNEE ARTHROPLASTY Left     Current Outpatient Rx  . Order #: [id number] Class: Normal  . Order #: [id number] Class: Historical Med  . Order #: [id number] Class: Historical Med  . Order #: [id number] Class: Print  . Order #: [id number] Class: Historical Med  . Order #:  [id number] Class: Print  . Order #: [id number] Class: Historical Med  . Order #: [id number] Class: Historical Med  . Order #: [id number] Class: Print  . Order #: [id number] Class: Normal  . Order #: [id number] Class: Normal  . Order #: [id number] Class: Normal    Allergies:  Penicillins  Family History: Family History  Problem Relation Age of Onset  . Diabetes Mother   . COPD Mother   . Diabetes Father   . Coronary artery disease Father   . Coronary artery disease Maternal Grandmother   . Heart attack Maternal Grandmother   . Diabetes Paternal Grandmother     Social History: Social History  Substance Use Topics  . Smoking status: Never Smoker  . Smokeless tobacco: Never Used  . Alcohol use 0.0 oz/week     Comment: 1/2 ca of beer every 2 weeks     Review of Systems:   10 point review of systems was performed and was otherwise negative:  Constitutional: No fever Eyes: No visual disturbances ENT: No sore throat, ear pain Cardiac: No chest pain Respiratory: No shortness of breath, wheezing, or stridor Abdomen: No abdominal pain, no vomiting, No diarrhea Endocrine: No weight loss, No night sweats Extremities: No peripheral edema, cyanosis Skin: No rashes, easy bruising Neurologic: No focal weakness, trouble with speech or swollowing Urologic: No dysuria, Hematuria, or urinary frequency Review of the patient's laboratory work that was done last week shows no significant abnormalities  Physical Exam:  ED Triage Vitals  Enc  Vitals Group     BP 10/18/15 1731 (!) 146/106     Pulse Rate 10/18/15 1731 90     Resp 10/18/15 1731 20     Temp 10/18/15 1731 98.1 F (36.7 C)     Temp Source 10/18/15 1731 Oral     SpO2 10/18/15 1731 96 %     Weight 10/18/15 1731 257 lb (116.6 kg)     Height 10/18/15 1731 5\' 4"  (1.626 m)     Head Circumference --      Peak Flow --      Pain Score 10/18/15 1732 9     Pain Loc --      Pain Edu? --      Excl. in GC? --     General: Awake , Alert ,  and Oriented times 3; GCS 15 Head: Normal cephalic , atraumatic Eyes: Pupils equal , round, reactive to light Nose/Throat: No nasal drainage, patent upper airway without erythema or exudate.  Neck: Supple, Full range of motion, No anterior adenopathy or palpable thyroid masses Lungs: Clear to ascultation without wheezes , rhonchi, or rales Heart: Regular rate, regular rhythm without murmurs , gallops , or rubs Abdomen: Soft, non tender without rebound, guarding , or rigidity; bowel sounds positive and symmetric in all 4 quadrants. No organomegaly .        Extremities: Patient has point tenderness over the medial surface of the left knee with some radiation up into the left femur region and also toward the left tibia. There is no knee instability. The patella is well aligned. There is no crepitus or ballottement. The patient's Loc didn't test is negative. There is some mild swelling in comparison to the left lower extremity. Extremities otherwise neurovascularly intact Neurologic: normal ambulation, Motor symmetric without deficits, sensory intact Skin: warm, dry, no rashes   Labs:   All laboratory work was reviewed including any pertinent negatives or positives listed below:  Labs Reviewed  GLUCOSE, CAPILLARY - Abnormal; Notable for the following:       Result Value   Glucose-Capillary 135 (*)    All other components within normal limits    Radiology:  "Dg Knee 2 Views Right  Result Date: 10/18/2015 CLINICAL DATA:  Right leg pain and swelling since Saturday EXAM: RIGHT KNEE - 1-2 VIEW COMPARISON:  None. FINDINGS: No fracture or dislocation is seen. Moderate tricompartmental degenerative changes, most prominent in the patellofemoral compartment. The visualized soft tissues are unremarkable. No definite suprapatellar knee joint effusion. IMPRESSION: No acute osseus abnormality is seen. Moderate degenerative changes. Electronically Signed   By: Charline BillsSriyesh  Krishnan M.D.   On: 10/18/2015 20:02    [place] Venous Img Lower Unilateral Right  Result Date: 10/18/2015 CLINICAL DATA:  Right leg pain and swelling for 4 days EXAM: RIGHT LOWER EXTREMITY VENOUS DOPPLER ULTRASOUND TECHNIQUE: Gray-scale sonography with graded compression, as well as color Doppler and duplex ultrasound were performed to evaluate the lower extremity deep venous systems from the level of the common femoral vein and including the common femoral, femoral, profunda femoral, popliteal and calf veins including the posterior tibial, peroneal and gastrocnemius veins when visible. The superficial great saphenous vein was also interrogated. Spectral Doppler was utilized to evaluate flow at rest and with distal augmentation maneuvers in the common femoral, femoral and popliteal veins. COMPARISON:  None. FINDINGS: Contralateral Common Femoral Vein: Respiratory phasicity is normal and symmetric with the symptomatic side. No evidence of thrombus. Normal compressibility. Common Femoral Vein: No evidence of thrombus. Saphenofemoral Junction:  No evidence of thrombus. Profunda Femoral Vein: No evidence of thrombus. Femoral Vein: No evidence of thrombus. Popliteal Vein: No evidence of thrombus. Calf Veins: No evidence of thrombus. IMPRESSION: No evidence of right lower extremity deep venous thrombosis. Electronically Signed   By: Marnee SpringJonathon  Watts M.D.   On: 10/18/2015 18:33  "  I personally reviewed the radiologic studies   Procedures: Patient had a knee immobilizer applied to the right knee per the nursing staff  ED Course:  Patient's stay here was uneventful and she'll be given pain medication. She is not appear to have a deep venous thrombosis and she has previously had normal renal function, etc. I felt the swelling was most likely inflammation from the right knee. She's been advised to rest, ice and elevate. She will receive prescription pain medication and advised to [facility]  Clinical Course      Assessment:  Right knee discomfort Right lower extremity swelling  Final Clinical Impression:   Final diagnoses:  Right knee pain     Plan:  Outpatient " Discharge Medication List as of 10/18/2015  8:30 PM    START taking these medications   Details  HYDROcodone-acetaminophen (NORCO) 5-325 MG tablet Take 1 tablet by mouth every 6 (six) hours as needed for moderate pain., Starting Tue 10/18/2015, Print      "Patient was advised take over-the-counter ibuprofen around the clock as tolerated  Patient was advised to return immediately if condition worsens. Patient was advised to follow up with their primary care physician or other specialized physicians involved in their outpatient care. The patient and/or family member/power of attorney had laboratory results reviewed at the bedside. All questions and concerns were addressed and appropriate discharge instructions were distributed by the nursing staff.            Jennye MoccasinBrian S Quigley, MD 10/18/15 [phone]

## 2015-10-18 NOTE — ED Triage Notes (Addendum)
Pt reports right leg pain and swelling since Saturday. Pt denies hx of blood clots, denies recent long travel. Pt dx with DM yesterday.

## 2015-10-18 NOTE — Discharge Instructions (Signed)
Please [facility] for further evaluation  Please return immediately if condition worsens. Please contact her primary physician or the physician you were given for referral. If you have any specialist physicians involved in her treatment and plan please also contact them. Thank you for using [facility] emergency [facility].

## 2015-10-19 ENCOUNTER — Ambulatory Visit: Payer: Self-pay | Admitting: Physician Assistant

## 2015-10-19 ENCOUNTER — Telehealth: Payer: Self-pay

## 2015-10-19 LAB — PAP IG AND HPV HIGH-RISK
HPV, HIGH-RISK: NEGATIVE
PAP SMEAR COMMENT: 0

## 2015-10-19 NOTE — Telephone Encounter (Signed)
Patient advised as directed below. 

## 2015-10-19 NOTE — Telephone Encounter (Deleted)
-----   Message from Margaretann LovelessJennifer M Burnette, [place]PA-C sent at 10/19/2015  1:07 PM EDT ----- Pap is normal, HPV negative.  Will repeat in 5 years.

## 2015-10-19 NOTE — Telephone Encounter (Signed)
-----   Message from Jennifer M Burnette, PA-C sent at 10/19/2015  1:07 PM EDT ----- Pap is normal, HPV negative.  Will repeat in 5 years. 

## 2015-12-16 ENCOUNTER — Other Ambulatory Visit: Payer: Self-pay

## 2015-12-16 DIAGNOSIS — E119 Type 2 diabetes mellitus without complications: Secondary | ICD-10-CM

## 2015-12-16 DIAGNOSIS — F319 Bipolar disorder, unspecified: Secondary | ICD-10-CM

## 2015-12-16 MED ORDER — DIVALPROEX SODIUM ER 500 MG PO TB24: 1000.0000 mg | ORAL_TABLET | Freq: Every day | ORAL | 1 refills | Status: DC

## 2015-12-16 MED ORDER — BENZTROPINE MESYLATE 0.5 MG PO TABS: 0.5000 mg | ORAL_TABLET | Freq: Two times a day (BID) | ORAL | 1 refills | Status: DC

## 2015-12-16 MED ORDER — METFORMIN HCL 500 MG PO TABS: 500.0000 mg | ORAL_TABLET | Freq: Two times a day (BID) | ORAL | 1 refills | Status: DC

## 2016-02-23 ENCOUNTER — Ambulatory Visit: Payer: Self-pay | Admitting: Physician Assistant

## 2016-02-23 ENCOUNTER — Telehealth: Payer: Self-pay | Admitting: Physician Assistant

## 2016-02-23 NOTE — Telephone Encounter (Signed)
Pt was coming in this am for bronchitis.  Her water was froze up so she reschd until tomorrow am.  She would like Boneta LucksJenny to call her today.  She said it was about the bronchitis and that Boneta LucksJenny knows her history.  Her call back is [phone]  Thanks, Barth Kirksteri

## 2016-02-23 NOTE — Telephone Encounter (Signed)
Spoke with patient. She is going to keep 945 appt tomorrow.

## 2016-02-23 NOTE — Telephone Encounter (Signed)
Please review. Thank you. sd  

## 2016-02-24 ENCOUNTER — Ambulatory Visit (INDEPENDENT_AMBULATORY_CARE_PROVIDER_SITE_OTHER): Admitting: Physician Assistant

## 2016-02-24 ENCOUNTER — Encounter: Payer: Self-pay | Admitting: Physician Assistant

## 2016-02-24 VITALS — BP 140/80 | HR 100 | Temp 97.7°F | Resp 16 | Wt 263.8 lb

## 2016-02-24 DIAGNOSIS — E559 Vitamin D deficiency, unspecified: Secondary | ICD-10-CM

## 2016-02-24 DIAGNOSIS — E7800 Pure hypercholesterolemia, unspecified: Secondary | ICD-10-CM | POA: Diagnosis not present

## 2016-02-24 DIAGNOSIS — J4541 Moderate persistent asthma with (acute) exacerbation: Secondary | ICD-10-CM | POA: Diagnosis not present

## 2016-02-24 DIAGNOSIS — E119 Type 2 diabetes mellitus without complications: Secondary | ICD-10-CM | POA: Diagnosis not present

## 2016-02-24 MED ORDER — PREDNISONE 10 MG PO TABS: ORAL_TABLET | ORAL | 1 refills | Status: DC

## 2016-02-24 MED ORDER — AZITHROMYCIN 250 MG PO TABS: ORAL_TABLET | ORAL | 1 refills | Status: DC

## 2016-02-24 NOTE — Patient Instructions (Signed)
Bronchospasm, Adult A bronchospasm is a spasm or tightening of the airways going into the lungs. During a bronchospasm breathing becomes more difficult because the airways get smaller. When this happens there can be coughing, a whistling sound when breathing (wheezing), and difficulty breathing. Bronchospasm is often associated with asthma, but not all patients who experience a bronchospasm have asthma. What are the causes? A bronchospasm is caused by inflammation or irritation of the airways. The inflammation or irritation may be triggered by:  Allergies (such as to animals, pollen, food, or mold). Allergens that cause bronchospasm may cause wheezing immediately after exposure or many hours later.  Infection. Viral infections are believed to be the most common cause of bronchospasm.  Exercise.  Irritants (such as pollution, cigarette smoke, strong odors, aerosol sprays, and paint fumes).  Weather changes. Winds increase molds and pollens in the air. Rain refreshes the air by washing irritants out. Cold air may cause inflammation.  Stress and emotional upset. What are the signs or symptoms?  Wheezing.  Excessive nighttime coughing.  Frequent or severe coughing with a simple cold.  Chest tightness.  Shortness of breath. How is this diagnosed? Bronchospasm is usually diagnosed through a history and physical exam. Tests, such as chest X-rays, are sometimes done to look for other conditions. How is this treated?  Inhaled medicines can be given to open up your airways and help you breathe. The medicines can be given using either an inhaler or a nebulizer machine.  Corticosteroid medicines may be given for severe bronchospasm, usually when it is associated with asthma. Follow these instructions at home:  Always have a plan prepared for seeking medical care. Know when to call your health care provider and local emergency services (911 in the [place] Know where you can access local  emergency care.  Only take medicines as directed by your health care provider.  If you were prescribed an inhaler or nebulizer machine, ask your health care provider to explain how to use it correctly. Always use a spacer with your inhaler if you were given one.  It is necessary to remain calm during an attack. Try to relax and breathe more slowly.  Control your home environment in the following ways:  Change your heating and air conditioning filter at least once a month.  Limit your use of fireplaces and wood stoves.  Do not smoke and do not allow smoking in your home.  Avoid exposure to perfumes and fragrances.  Get rid of pests (such as roaches and mice) and their droppings.  Throw away plants if you see mold on them.  Keep your house clean and dust free.  Replace carpet with wood, tile, or vinyl flooring. Carpet can trap dander and dust.  Use allergy-proof pillows, mattress covers, and box spring covers.  Wash bed sheets and blankets every week in hot water and dry them in a dryer.  Use blankets that are made of polyester or cotton.  Wash hands frequently. Contact a health care provider if:  You have muscle aches.  You have chest pain.  The sputum changes from clear or white to yellow, green, gray, or bloody.  The sputum you cough up gets thicker.  There are problems that may be related to the medicine you are given, such as a rash, itching, swelling, or trouble breathing. Get help right away if:  You have worsening wheezing and coughing even after taking your prescribed medicines.  You have increased difficulty breathing.  You develop severe chest pain. This   information is not intended to replace advice given to you by your health care provider. Make sure you discuss any questions you have with your health care provider. Document Released: 02/15/2003 Document Revised: 07/21/2015 Document Reviewed: 08/04/2012 Elsevier Interactive Patient Education  2017  Elsevier Inc.  

## 2016-02-24 NOTE — Progress Notes (Signed)
Patient: Joanna Bishop Female    DOB: 01/22/1956   60 y.o.   MRN: [id number] Visit Date: 02/24/2016  Today's Provider: Margaretann LovelessJennifer M Anaika Santillano, PA-C   Chief Complaint  Patient presents with  . URI   Subjective:    URI   This is a new problem. The current episode started in the past 7 days (Started on Christmas eve). The problem has been gradually worsening. There has been no fever. Associated symptoms include congestion, coughing, headaches, a plugged ear sensation, a sore throat (from coughing) and wheezing. Pertinent negatives include no abdominal pain, chest pain, ear pain, rhinorrhea, sinus pain, sneezing or swollen glands. She has tried decongestant and inhaler use (Tylenol Cold an Flu) for the symptoms. The treatment provided no relief.   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 She reports he is still ambulatory but is still declining steadily.   She is also fasting today so that we may recheck her A1c, cholesterol and Vit D. These labs had been abnormal at last check. A1c indicated diabetes and she was started on metformin 500mg  BID. She reports compliance with the medications but states she is having increased appetite, tingling in the hands and lower extremity swelling from the metformin.    Allergies  Allergen Reactions  . Penicillins      Current Outpatient Prescriptions:  .  albuterol (PROVENTIL HFA;VENTOLIN HFA) 108 (90 BASE) MCG/ACT inhaler, Inhale 2 puffs into the lungs every 4 (four) hours as needed for wheezing or shortness of breath. Every 4-6 hours as needed, Disp: 1 Inhaler, Rfl: 11 .  aspirin 81 MG tablet, Take by mouth daily., Disp: , Rfl:  .  benztropine (COGENTIN) 0.5 MG tablet, Take 1 tablet (0.5 mg total) by mouth 2 (two) times daily., Disp: 180 tablet, Rfl: 1 .  butalbital-acetaminophen-caffeine (FIORICET WITH CODEINE) 50-325-40-30 MG capsule, Take 1 capsule by mouth every 4 (four) hours as needed for headache., Disp: 30 capsule, Rfl: 0 .   Calcium-Magnesium-Vitamin D (CALCIUM 500 PO), Take 1 tablet by mouth 2 (two) times daily., Disp: , Rfl:  .  divalproex (DEPAKOTE ER) 500 MG 24 hr tablet, Take 2 tablets (1,000 mg total) by mouth at bedtime., Disp: 180 tablet, Rfl: 1 .  fluticasone (FLONASE) 50 MCG/ACT nasal spray, Place 2 sprays into the nose daily., Disp: , Rfl:  .  HYDROcodone-acetaminophen (NORCO) 5-325 MG tablet, Take 1 tablet by mouth every 6 (six) hours as needed for moderate pain., Disp: 20 tablet, Rfl: 0 .  metFORMIN (GLUCOPHAGE) 500 MG tablet, Take 1 tablet (500 mg total) by mouth 2 (two) times daily with a meal., Disp: 180 tablet, Rfl: 1 .  montelukast (SINGULAIR) 10 MG tablet, Take 1 tablet (10 mg total) by mouth daily., Disp: 90 tablet, Rfl: 3 .  Umeclidinium Bromide (INCRUSE ELLIPTA) 62.5 MCG/INH AEPB, Inhale 1 puff into the lungs daily., Disp: 30 each, Rfl: 13 .  clonazePAM (KLONOPIN) 0.5 MG tablet, Take 1 tablet (0.5 mg total) by mouth 3 (three) times daily as needed (as needed for anxiety). (Patient not taking: Reported on 02/24/2016), Disp: 15 tablet, Rfl: 0  Review of Systems  Constitutional: Positive for appetite change (increased with metformin) and fatigue. Negative for fever.  HENT: Positive for congestion and sore throat (from coughing). Negative for ear pain, postnasal drip, rhinorrhea, sinus pain, sinus pressure and sneezing.   Respiratory: Positive for cough, chest tightness, shortness of breath and wheezing.   Cardiovascular: Positive for leg swelling. Negative  for chest pain and palpitations.  Gastrointestinal: Negative for abdominal pain.  Neurological: Positive for numbness and headaches. Negative for dizziness and weakness.    Social History  Substance Use Topics  . Smoking status: Never Smoker  . Smokeless tobacco: Never Used  . Alcohol use 0.0 oz/week     Comment: 1/2 ca of beer every 2 weeks   Objective:   BP 140/80 (BP Location: Right Arm, Patient Position: Sitting, Cuff Size: Normal)    Pulse 100   Temp 97.7 F (36.5 C) (Oral)   Resp 16   Wt 263 lb 12.8 oz (119.7 kg)   SpO2 96%   BMI 45.28 kg/m   Physical Exam  Constitutional: She appears well-developed and well-nourished. No distress.  HENT:  Head: Normocephalic and atraumatic.  Right Ear: Hearing, external ear and ear canal normal. Tympanic membrane is bulging. Tympanic membrane is not perforated and not erythematous. A middle ear effusion is present.  Left Ear: Hearing, external ear and ear canal normal. Tympanic membrane is bulging. Tympanic membrane is not perforated and not erythematous. A middle ear effusion is present.  Nose: Mucosal edema and rhinorrhea present. Right sinus exhibits no maxillary sinus tenderness and no frontal sinus tenderness. Left sinus exhibits no maxillary sinus tenderness and no frontal sinus tenderness.  Mouth/Throat: Uvula is midline and mucous membranes are normal. Posterior oropharyngeal erythema present. No oropharyngeal exudate or posterior oropharyngeal edema.  Dull, bulging TM  Eyes: Conjunctivae are normal. Pupils are equal, round, and reactive to light. Right eye exhibits no discharge. Left eye exhibits no discharge. No scleral icterus.  Neck: Normal range of motion. Neck supple. No tracheal deviation present. No thyromegaly present.  Cardiovascular: Normal rate, regular rhythm and normal heart sounds.  Exam reveals no gallop and no friction rub.   No murmur heard. Pulmonary/Chest: Effort normal. No stridor. No respiratory distress. She has decreased breath sounds. She has wheezes (expiratory throughout). She has no rhonchi. She has no rales.  Lymphadenopathy:    She has no cervical adenopathy.  Skin: Skin is warm and dry. She is not diaphoretic.  Vitals reviewed.     Assessment & Plan:     1. Moderate persistent asthmatic bronchitis with acute exacerbation Worsening symptoms. Will treat with Zpak and prednisone as below. She is to continue her regular inhalers as well. Push  fluids and try to get plenty of rest. Call if no improvement.  - azithromycin (ZITHROMAX) 250 MG tablet; Take 2 tablets PO on day one, and one tablet PO daily thereafter until completed.  Dispense: 6 tablet; Refill: 1 - predniSONE (DELTASONE) 10 MG tablet; Take 6 tabs PO on day 1&2, 5 tabs PO on day 3&4, 4 tabs PO on day 5&6, 3 tabs PO on day 7&8, 2 tabs PO on day 9&10, 1 tab PO on day 11&12.  Dispense: 42 tablet; Refill: 1  2. Type 2 diabetes mellitus without complication, without long-term current use of insulin (HCC) Continue metformin for right now. Will check labs as below and f/u pending lab results. May consider discontinuing if normal, or changing if still elevated. I will see her back in 3 months for recheck. - CBC with Differential - HgB A1c  3. Hypercholesteremia Stable. Diagnosis pulled for medication refill. Continue current medical treatment plan. - CBC with Differential - Lipid Profile  4. Avitaminosis D Previously low. Will check labs as below and f/u pending results. - CBC with Differential - Vitamin D (25 hydroxy)       Alessandra BevelsJennifer M  Marlyn Corporal, PA-C  [facility]

## 2016-04-13 ENCOUNTER — Encounter: Payer: Self-pay | Admitting: Physician Assistant

## 2016-04-13 ENCOUNTER — Ambulatory Visit (INDEPENDENT_AMBULATORY_CARE_PROVIDER_SITE_OTHER): Admitting: Physician Assistant

## 2016-04-13 VITALS — BP 150/98 | HR 96 | Temp 98.6°F | Resp 16 | Wt 258.0 lb

## 2016-04-13 DIAGNOSIS — F320 Major depressive disorder, single episode, mild: Secondary | ICD-10-CM | POA: Diagnosis not present

## 2016-04-13 DIAGNOSIS — M1711 Unilateral primary osteoarthritis, right knee: Secondary | ICD-10-CM | POA: Diagnosis not present

## 2016-04-13 DIAGNOSIS — F4321 Adjustment disorder with depressed mood: Secondary | ICD-10-CM

## 2016-04-13 DIAGNOSIS — F4320 Adjustment disorder, unspecified: Secondary | ICD-10-CM

## 2016-04-13 MED ORDER — ALPRAZOLAM 0.5 MG PO TBDP: 0.5000 mg | ORAL_TABLET | Freq: Two times a day (BID) | ORAL | 3 refills | Status: DC | PRN

## 2016-04-13 MED ORDER — ALPRAZOLAM 0.5 MG PO TABS: 0.5000 mg | ORAL_TABLET | Freq: Two times a day (BID) | ORAL | 3 refills | Status: DC | PRN

## 2016-04-13 NOTE — Progress Notes (Signed)
Patient: Joanna Bishop Female    DOB: January 30, 1956   61 y.o.   MRN: [id number] Visit Date: 04/13/2016  Today's Provider: Margaretann Loveless, PA-C   Chief Complaint  Patient presents with  . Discuss Ortho Referral   Subjective:    HPI Patient is here today with c/o needing a referral to Ortho for her knee. Patient was seen at the ER for Right knee pain on 10/18/2015. She reports her right knee is feeling worsening.  Depression: Patient complains of depression. She complains of depressed mood and difficulty concentrating.   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 Patient reports that her husband passed away 2022/04/04 the 08-21-2022 from ALS. She is having trouble sleeping, coping, crying and is just "hard". She doesn't have a counselor at this moment. She needs to find a counselor that takes her insurance. She is using the [facility] grief counselors. She also has good support at home with her kids and grandkids.    Allergies  Allergen Reactions  . Penicillins      Current Outpatient Prescriptions:  .  albuterol (PROVENTIL HFA;VENTOLIN HFA) 108 (90 BASE) MCG/ACT inhaler, Inhale 2 puffs into the lungs every 4 (four) hours as needed for wheezing or shortness of breath. Every 4-6 hours as needed, Disp: 1 Inhaler, Rfl: 11 .  aspirin 81 MG tablet, Take by mouth daily., Disp: , Rfl:  .  benztropine (COGENTIN) 0.5 MG tablet, Take 1 tablet (0.5 mg total) by mouth 2 (two) times daily., Disp: 180 tablet, Rfl: 1 .  butalbital-acetaminophen-caffeine (FIORICET WITH CODEINE) 50-325-40-30 MG capsule, Take 1 capsule by mouth every 4 (four) hours as needed for headache., Disp: 30 capsule, Rfl: 0 .  Calcium-Magnesium-Vitamin D (CALCIUM 500 PO), Take 1 tablet by mouth 2 (two) times daily., Disp: , Rfl:  .  divalproex (DEPAKOTE ER) 500 MG 24 hr tablet, Take 2 tablets (1,000 mg total) by mouth at bedtime., Disp: 180 tablet, Rfl: 1 .  fluticasone (FLONASE) 50  MCG/ACT nasal spray, Place 2 sprays into the nose daily., Disp: , Rfl:  .  HYDROcodone-acetaminophen (NORCO) 5-325 MG tablet, Take 1 tablet by mouth every 6 (six) hours as needed for moderate pain., Disp: 20 tablet, Rfl: 0 .  metFORMIN (GLUCOPHAGE) 500 MG tablet, Take 1 tablet (500 mg total) by mouth 2 (two) times daily with a meal., Disp: 180 tablet, Rfl: 1 .  montelukast (SINGULAIR) 10 MG tablet, Take 1 tablet (10 mg total) by mouth daily., Disp: 90 tablet, Rfl: 3 .  Umeclidinium Bromide (INCRUSE ELLIPTA) 62.5 MCG/INH AEPB, Inhale 1 puff into the lungs daily., Disp: 30 each, Rfl: 13 .  azithromycin (ZITHROMAX) 250 MG tablet, Take 2 tablets PO on day one, and one tablet PO daily thereafter until completed. (Patient not taking: Reported on 04/13/2016), Disp: 6 tablet, Rfl: 1 .  clonazePAM (KLONOPIN) 0.5 MG tablet, Take 1 tablet (0.5 mg total) by mouth 3 (three) times daily as needed (as needed for anxiety). (Patient not taking: Reported on 02/24/2016), Disp: 15 tablet, Rfl: 0 .  predniSONE (DELTASONE) 10 MG tablet, Take 6 tabs PO on day 1&2, 5 tabs PO on day 3&4, 4 tabs PO on day 5&6, 3 tabs PO on day 7&8, 2 tabs PO on day 9&10, 1 tab PO on day 11&12. (Patient not taking: Reported on 04/13/2016), Disp: 42 tablet, Rfl: 1  Review of Systems  Constitutional: Negative.        Irritability  Respiratory:  Negative.   Cardiovascular: Positive for palpitations. Negative for chest pain and leg swelling.  Gastrointestinal: Negative.   Musculoskeletal: Positive for arthralgias, gait problem and joint swelling.  Psychiatric/Behavioral: Positive for decreased concentration, dysphoric mood and sleep disturbance. The patient is nervous/anxious.     Social History  Substance Use Topics  . Smoking status: Never Smoker  . Smokeless tobacco: Never Used  . Alcohol use 0.0 oz/week     Comment: 1/2 ca of beer every 2 weeks   Objective:   BP (!) 150/98 (BP Location: Right Arm, Patient Position: Sitting, Cuff Size:  Large)   Pulse 96   Temp 98.6 F (37 C) (Oral)   Resp 16   Wt 258 lb (117 kg)   BMI 44.29 kg/m   Physical Exam  Constitutional: She appears well-developed and well-nourished. No distress.  Neck: Normal range of motion. Neck supple. No JVD present. No tracheal deviation present. No thyromegaly present.  Cardiovascular: Normal rate, regular rhythm and normal heart sounds.  Exam reveals no gallop and no friction rub.   No murmur heard. Pulmonary/Chest: Effort normal and breath sounds normal. No respiratory distress. She has no wheezes. She has no rales.  Musculoskeletal:       Right knee: She exhibits decreased range of motion and swelling. She exhibits no effusion, no deformity, no erythema, normal alignment, no LCL laxity, normal patellar mobility, no bony tenderness, normal meniscus and no MCL laxity. Tenderness found. Medial joint line, lateral joint line and patellar tendon tenderness noted.  Lymphadenopathy:    She has no cervical adenopathy.  Skin: She is not diaphoretic.  Psychiatric: Her speech is normal and behavior is normal. Judgment and thought content normal. Cognition and memory are normal. She exhibits a depressed mood. She expresses no suicidal plans and no homicidal plans.  Very tearful  Vitals reviewed.      Assessment & Plan:     1. Primary osteoarthritis of right knee Xrays done in the ER from 09/2015 showed tricompartmental OA, worst in the patellar region. Will refer to orthopedics as below.  - Ambulatory referral to Orthopedic Surgery  2. Grief reaction Husband recently passed from ALS. She was primary caregiver. Has been using Hosp[facility] counseling. Would like to establish with counselor for other issues.  - Ambulatory referral to Psychology  3. Depression, major, single episode, mild (HCC) See above medical treatment plan. - Ambulatory referral to Psychology       Margaretann LovelessJennifer M Burnette, PA-C  [facility]

## 2016-04-13 NOTE — Patient Instructions (Signed)
Complicated Grieving Introduction Grief is a normal response to the death of someone close to you. Feelings of fear, anger, and guilt can affect almost everyone who loses a loved one. It is also common to have symptoms of depression while you are grieving. These include problems with sleep, loss of appetite, and lack of energy. They may last for weeks or months after a loss. Complicated grief is different from normal grief or depression. Normal grieving involves sadness and feelings of loss, but these feelings are not constant. Complicated grief is a constant and severe type of grief. It interferes with your ability to function normally. It may last for several months to a year or longer. Complicated grief may require treatment from a mental health care provider. What are the causes? It is not known why some people continue to struggle with grief and others do not. You may be at higher risk for complicated grief if:  The death of your loved one was sudden or unexpected.  The death of your loved one was due to a violent event.  Your loved one committed suicide.  Your loved one was a child or a young person.  You were very close to or dependent on the loved one.  You have a history of depression. What are the signs or symptoms? Signs and symptoms of complicated grief may include:  Feeling disbelief or numbness.  Being unable to enjoy good memories of your loved one.  Needing to avoid anything that reminds you of your loved one.  Being unable to stop thinking about the death.  Feeling intense anger or guilt.  Feeling alone and hopeless.  Feeling that your life is meaningless and empty.  Losing the desire to live. How is this diagnosed? Your health care provider may diagnose complicated grief if:  You have constant symptoms of grief for 6-12 months or longer.  Your symptoms are interfering with your ability to live your life. Your health care provider may want you to see a  mental health care provider. Many symptoms of depression are similar to the symptoms of complicated grief. It is important to be evaluated for complicated grief along with other mental health conditions. How is this treated? Talk therapy with a mental health provider is the most common treatment for complicated grief. During therapy, you will learn healthy ways to cope with the loss of your loved one. In some cases, your mental health care provider may also recommend antidepressant medicines. Follow these instructions at home:  Take care of yourself.  Eat regular meals and maintain a healthy diet. Eat plenty of fruits, vegetables, and whole grains.  Try to get some exercise each day.  Keep regular hours for sleep. Try to get at least 8 hours of sleep each night.  Do not use drugs or alcohol to ease your symptoms.  Take medicines only as directed by your health care provider.  Spend time with friends and loved ones.  Consider joining a grief (bereavement) support group to help you deal with your loss.  Keep all follow-up visits as directed by your health care provider. This is important. Contact a health care provider if:  Your symptoms keep you from functioning normally.  Your symptoms do not get better with treatment. Get help right away if:  You have serious thoughts of hurting yourself or someone else.  You have suicidal feelings. This information is not intended to replace advice given to you by your health care provider. Make sure you discuss any   questions you have with your health care provider. Document Released: 02/12/2005 Document Revised: 07/21/2015 Document Reviewed: 07/23/2013  2017 Elsevier  

## 2016-04-18 ENCOUNTER — Ambulatory Visit: Payer: Self-pay | Admitting: Physician Assistant

## 2016-04-27 ENCOUNTER — Telehealth: Payer: Self-pay | Admitting: Physician Assistant

## 2016-04-27 DIAGNOSIS — J111 Influenza due to unidentified influenza virus with other respiratory manifestations: Secondary | ICD-10-CM

## 2016-04-27 MED ORDER — OSELTAMIVIR PHOSPHATE 75 MG PO CAPS: 75.0000 mg | ORAL_CAPSULE | Freq: Two times a day (BID) | ORAL | 0 refills | Status: DC

## 2016-04-27 NOTE — Telephone Encounter (Signed)
Patient advised as directed below.  Thanks,  -Calissa Swenor 

## 2016-04-27 NOTE — Telephone Encounter (Signed)
Please Review.  Thanks,  -Brynden Thune 

## 2016-04-27 NOTE — Telephone Encounter (Signed)
Pt was in 2 weeks ago.  She now thinks she has caught the flu form her grandaughter.  She would like to get some tamilflu and or an antibiotic for the cough.  [facility]  Thanks  Barth Kirkseri

## 2016-04-27 NOTE — Telephone Encounter (Signed)
Tamiflu sent to [facility] [address]. Call if symptoms worsen or fail to improve.

## 2016-04-30 ENCOUNTER — Telehealth: Payer: Self-pay | Admitting: Physician Assistant

## 2016-04-30 ENCOUNTER — Ambulatory Visit (INDEPENDENT_AMBULATORY_CARE_PROVIDER_SITE_OTHER): Admitting: Physician Assistant

## 2016-04-30 ENCOUNTER — Encounter: Payer: Self-pay | Admitting: Physician Assistant

## 2016-04-30 VITALS — BP 120/70 | HR 116 | Temp 99.6°F | Resp 19 | Wt 250.8 lb

## 2016-04-30 DIAGNOSIS — J4541 Moderate persistent asthma with (acute) exacerbation: Secondary | ICD-10-CM

## 2016-04-30 DIAGNOSIS — J4521 Mild intermittent asthma with (acute) exacerbation: Secondary | ICD-10-CM | POA: Diagnosis not present

## 2016-04-30 MED ORDER — PREDNISONE 10 MG PO TABS: ORAL_TABLET | ORAL | 0 refills | Status: DC

## 2016-04-30 MED ORDER — HYDROCOD POLST-CPM POLST ER 10-8 MG/5ML PO SUER: 5.0000 mL | Freq: Two times a day (BID) | ORAL | 0 refills | Status: DC | PRN

## 2016-04-30 MED ORDER — ALBUTEROL SULFATE HFA 108 (90 BASE) MCG/ACT IN AERS: 1.0000 | INHALATION_SPRAY | RESPIRATORY_TRACT | 11 refills | Status: DC | PRN

## 2016-04-30 MED ORDER — LEVOFLOXACIN 500 MG PO TABS: 500.0000 mg | ORAL_TABLET | Freq: Every day | ORAL | 0 refills | Status: DC

## 2016-04-30 NOTE — Patient Instructions (Signed)
Bronchospasm, Adult Bronchospasm is a tightening of the airways going into the lungs. During an episode, it may be harder to breathe. You may cough, and you may make a whistling sound when you breathe (wheeze). This condition often affects people with asthma. What are the causes? This condition is caused by swelling and irritation in the airways. It can be triggered by:  An infection (common).  Seasonal allergies.  An allergic reaction.  Exercise.  Irritants. These include pollution, cigarette smoke, strong odors, aerosol sprays, and paint fumes.  Weather changes. Winds increase molds and pollens in the air. Cold air may cause swelling.  Stress and emotional upset.  What are the signs or symptoms? Symptoms of this condition include:  Wheezing. If the episode was triggered by an allergy, wheezing may start right away or hours later.  Nighttime coughing.  Frequent or severe coughing with a simple cold.  Chest tightness.  Shortness of breath.  Decreased ability to exercise.  How is this diagnosed? This condition is usually diagnosed with a review of your medical history and a physical exam. Tests, such as lung function tests, are sometimes done to look for other conditions. The need for a chest X-ray depends on where the wheezing occurs and whether it is the first time you have wheezed. How is this treated? This condition may be treated with:  Inhaled medicines. These open up the airways and help you breathe. They can be taken with an inhaler or a nebulizer device.  Corticosteroid medicines. These may be given for severe bronchospasm, usually when it is associated with asthma.  Avoiding triggers, such as irritants, infection, or allergies.  Follow these instructions at home: Medicines  Take over-the-counter and prescription medicines only as told by your health care provider.  If you need to use an inhaler or nebulizer to take your medicine, ask your health care  provider to explain how to use it correctly. If you were given a spacer, always use it with your inhaler. Lifestyle  Reduce the number of triggers in your home. To do this: ? Change your heating and air conditioning filter at least once a month. ? Limit your use of fireplaces and wood stoves. ? Do not smoke. Do not allow smoking in your home. ? Avoid using perfumes and fragrances. ? Get rid of pests, such as roaches and mice, and their droppings. ? Remove any mold from your home. ? Keep your house clean and dust free. Use unscented cleaning products. ? Replace carpet with wood, tile, or vinyl flooring. Carpet can trap dander and dust. ? Use allergy-proof pillows, mattress covers, and box spring covers. ? Wash bed sheets and blankets every week in hot water. Dry them in a dryer. ? Use blankets that are made of polyester or cotton. ? Wash your hands often. ? Do not allow pets in your bedroom.  Avoid breathing in cold air when you exercise. General instructions  Have a plan for seeking medical care. Know when to call your health care provider and local emergency services, and where to get emergency care.  Stay up to date on your immunizations.  When you have an episode of bronchospasm, stay calm. Try to relax and breathe more slowly.  If you have asthma, make sure you have an asthma action plan.  Keep all follow-up visits as told by your health care provider. This is important. Contact a health care provider if:  You have muscle aches.  You have chest pain.  The mucus that you   cough up (sputum) changes from clear or white to yellow, green, gray, or bloody.  You have a fever.  Your sputum gets thicker. Get help right away if:  Your wheezing and coughing get worse, even after you take your prescribed medicines.  It gets even harder to breathe.  You develop severe chest pain. Summary  Bronchospasm is a tightening of the airways going into the lungs.  During an episode of  bronchospasm, you may have a harder time breathing. You may cough and make a whistling sound when you breathe (wheeze).  Avoid exposure to triggers such as smoke, dust, mold, animal dander, and fragrances.  When you have an episode of bronchospasm, stay calm. Try to relax and breathe more slowly. This information is not intended to replace advice given to you by your health care provider. Make sure you discuss any questions you have with your health care provider. Document Released: 02/15/2003 Document Revised: 02/09/2016 Document Reviewed: 02/09/2016 Elsevier Interactive Patient Education  2017 Elsevier Inc.  

## 2016-04-30 NOTE — Telephone Encounter (Signed)
Triaged call: Patient calling of not feeling better. She reports SOB, chest tightness when she takes deep breath, wheezing. She reports that she had a fever of 102.1 on Saturday. No fever today. She is taking the Tamiflu that was prescribed on 03/02. She reports also taking Theraflu, Tylenol Severe Cold and Flu. Offered and appointment earlier with another provider, patient prefers afternoon appointment. She is scheduled with Antony ContrasJenni at 4 pm. Advised patient if she started to feel worse and more SOB or not feeling well to call the office to see if we can get her in with another provider or go the Urgent Care or Emergency department. Patient verbalized understanding.  Thanks,  -Joseline

## 2016-04-30 NOTE — Telephone Encounter (Signed)
Noted  

## 2016-04-30 NOTE — Progress Notes (Signed)
Patient: Joanna Bishop Female    DOB: 02-03-1956   60 y.o.   MRN: [id number] Visit Date: 04/30/2016  Today's Provider: Margaretann Loveless, PA-C   Chief Complaint  Patient presents with  . URI   Subjective:    HPI Patient is here today with c/o of not doing any better. She called 03/02 that she was exposed to her granddaughter who had the Flu.  Patient requested Tamiflu. Medication was sent to the pharmacy. Per patient she is having SOB, chest tightness with deep breath, wheezing. She had a temperature of 102.0 Saturday. She has tried TheraFlu, tylenol severe cold and Flu. Inhaler every 4-6 hours and Flonase.    Allergies  Allergen Reactions  . Penicillins      Current Outpatient Prescriptions:  .  albuterol (PROVENTIL HFA;VENTOLIN HFA) 108 (90 BASE) MCG/ACT inhaler, Inhale 2 puffs into the lungs every 4 (four) hours as needed for wheezing or shortness of breath. Every 4-6 hours as needed, Disp: 1 Inhaler, Rfl: 11 .  ALPRAZolam (XANAX) 0.5 MG tablet, Take 1 tablet (0.5 mg total) by mouth 2 (two) times daily as needed for anxiety., Disp: 60 tablet, Rfl: 3 .  aspirin 81 MG tablet, Take by mouth daily., Disp: , Rfl:  .  butalbital-acetaminophen-caffeine (FIORICET WITH CODEINE) 50-325-40-30 MG capsule, Take 1 capsule by mouth every 4 (four) hours as needed for headache., Disp: 30 capsule, Rfl: 0 .  Calcium-Magnesium-Vitamin D (CALCIUM 500 PO), Take 1 tablet by mouth 2 (two) times daily., Disp: , Rfl:  .  divalproex (DEPAKOTE ER) 500 MG 24 hr tablet, Take 2 tablets (1,000 mg total) by mouth at bedtime., Disp: 180 tablet, Rfl: 1 .  fluticasone (FLONASE) 50 MCG/ACT nasal spray, Place 2 sprays into the nose daily., Disp: , Rfl:  .  HYDROcodone-acetaminophen (NORCO) 5-325 MG tablet, Take 1 tablet by mouth every 6 (six) hours as needed for moderate pain., Disp: 20 tablet, Rfl: 0 .  metFORMIN (GLUCOPHAGE) 500 MG tablet, Take 1 tablet (500 mg total) by mouth 2 (two) times daily with a meal.,  Disp: 180 tablet, Rfl: 1 .  montelukast (SINGULAIR) 10 MG tablet, Take 1 tablet (10 mg total) by mouth daily., Disp: 90 tablet, Rfl: 3 .  oseltamivir (TAMIFLU) 75 MG capsule, Take 1 capsule (75 mg total) by mouth 2 (two) times daily., Disp: 10 capsule, Rfl: 0 .  Umeclidinium Bromide (INCRUSE ELLIPTA) 62.5 MCG/INH AEPB, Inhale 1 puff into the lungs daily., Disp: 30 each, Rfl: 13 .  azithromycin (ZITHROMAX) 250 MG tablet, Take 2 tablets PO on day one, and one tablet PO daily thereafter until completed. (Patient not taking: Reported on 04/13/2016), Disp: 6 tablet, Rfl: 1 .  benztropine (COGENTIN) 0.5 MG tablet, Take 1 tablet (0.5 mg total) by mouth 2 (two) times daily. (Patient not taking: Reported on 04/30/2016), Disp: 180 tablet, Rfl: 1 .  clonazePAM (KLONOPIN) 0.5 MG tablet, Take 1 tablet (0.5 mg total) by mouth 3 (three) times daily as needed (as needed for anxiety). (Patient not taking: Reported on 02/24/2016), Disp: 15 tablet, Rfl: 0 .  predniSONE (DELTASONE) 10 MG tablet, Take 6 tabs PO on day 1&2, 5 tabs PO on day 3&4, 4 tabs PO on day 5&6, 3 tabs PO on day 7&8, 2 tabs PO on day 9&10, 1 tab PO on day 11&12. (Patient not taking: Reported on 04/13/2016), Disp: 42 tablet, Rfl: 1  Review of Systems  Constitutional: Positive for chills and fever.  HENT: Positive for  congestion, postnasal drip, rhinorrhea and sore throat ("from coughing"). Negative for ear pain, sinus pain, sinus pressure, sneezing, tinnitus and trouble swallowing.   Respiratory: Positive for cough, chest tightness, shortness of breath and wheezing.   Cardiovascular: Negative for chest pain, palpitations and leg swelling.  Gastrointestinal: Negative for abdominal pain.  Neurological: Positive for light-headedness and headaches. Negative for dizziness.    Social History  Substance Use Topics  . Smoking status: Never Smoker  . Smokeless tobacco: Never Used  . Alcohol use 0.0 oz/week     Comment: 1/2 ca of beer every 2 weeks    Objective:   BP 120/70 (BP Location: Right Arm, Patient Position: Sitting, Cuff Size: Normal)   Pulse (!) 116   Temp 99.6 F (37.6 C) (Oral)   Resp 19   Wt 250 lb 12.8 oz (113.8 kg)   SpO2 96% Comment: 96-95%  BMI 43.05 kg/m  Vitals:   04/30/16 1608  BP: 120/70  Pulse: (!) 116  Resp: 19  Temp: 99.6 F (37.6 C)  TempSrc: Oral  SpO2: 96%  Weight: 250 lb 12.8 oz (113.8 kg)     Physical Exam  Constitutional: She appears well-developed and well-nourished. She appears ill. No distress.  HENT:  Head: Normocephalic and atraumatic.  Right Ear: Hearing, tympanic membrane, external ear and ear canal normal.  Left Ear: Hearing, tympanic membrane, external ear and ear canal normal.  Nose: Mucosal edema and rhinorrhea present. Right sinus exhibits no maxillary sinus tenderness and no frontal sinus tenderness. Left sinus exhibits no maxillary sinus tenderness and no frontal sinus tenderness.  Mouth/Throat: Uvula is midline, oropharynx is clear and moist and mucous membranes are normal. No oropharyngeal exudate, posterior oropharyngeal edema or posterior oropharyngeal erythema.  Eyes: Conjunctivae are normal. Pupils are equal, round, and reactive to light. Right eye exhibits no discharge. Left eye exhibits no discharge. No scleral icterus.  Neck: Normal range of motion. Neck supple. No tracheal deviation present. No thyromegaly present.  Cardiovascular: Regular rhythm and normal heart sounds.  Tachycardia present.  Exam reveals no gallop and no friction rub.   No murmur heard. Pulmonary/Chest: Effort normal. No stridor. No respiratory distress. She has decreased breath sounds. She has wheezes. She has no rhonchi. She has no rales.  Lymphadenopathy:    She has no cervical adenopathy.  Skin: Skin is warm and dry. She is not diaphoretic.  Vitals reviewed.      Assessment & Plan:     1. Moderate persistent asthmatic bronchitis with acute exacerbation Worsening. Will give prednisone and  levaquin as below. Offered breathing treatment but she had just had her inhaler before the appointment. Tussionex given for cough suppression. She is to call the office if symptoms worsen.  - predniSONE (DELTASONE) 10 MG tablet; Take 6 tabs PO on day 1&2, 5 tabs PO on day 3&4, 4 tabs PO on day 5&6, 3 tabs PO on day 7&8, 2 tabs PO on day 9&10, 1 tab PO on day 11&12.  Dispense: 42 tablet; Refill: 0 - levofloxacin (LEVAQUIN) 500 MG tablet; Take 1 tablet (500 mg total) by mouth daily.  Dispense: 10 tablet; Refill: 0 - chlorpheniramine-HYDROcodone (TUSSIONEX PENNKINETIC ER) 10-8 MG/5ML SUER; Take 5 mLs by mouth every 12 (twelve) hours as needed for cough.  Dispense: 240 mL; Refill: 0  2. AB (asthmatic bronchitis), mild intermittent Stable. Diagnosis pulled for medication refill. Continue current medical treatment plan. - albuterol (PROVENTIL HFA;VENTOLIN HFA) 108 (90 Base) MCG/ACT inhaler; Inhale 1-2 puffs into the lungs every 4 (  four) hours as needed for wheezing or shortness of breath. Every 4-6 hours as needed  Dispense: 1 Inhaler; Refill: 11       Margaretann LovelessJennifer M Burnette, PA-C  [facility]

## 2016-05-15 ENCOUNTER — Other Ambulatory Visit: Payer: Self-pay | Admitting: Physician Assistant

## 2016-05-15 DIAGNOSIS — Z7189 Other specified counseling: Secondary | ICD-10-CM

## 2016-05-15 DIAGNOSIS — F339 Major depressive disorder, recurrent, unspecified: Secondary | ICD-10-CM

## 2016-05-15 DIAGNOSIS — F319 Bipolar disorder, unspecified: Secondary | ICD-10-CM

## 2016-05-15 NOTE — Progress Notes (Signed)
Referral placed for psychology counseling; [facility] because no provider there sees over the age of 59.

## 2016-05-24 ENCOUNTER — Ambulatory Visit: Admitting: Physician Assistant

## 2016-05-24 NOTE — Progress Notes (Deleted)
Patient: Joanna Bishop Female    DOB: August 07, 1955   61 y.o.   MRN: [id number] Visit Date: 05/24/2016  Today's Provider: Margaretann Loveless, PA-C   No chief complaint on file.  Subjective:    HPI  Diabetes Mellitus Type II, Follow-up:   Lab Results  Component Value Date   HGBA1C 6.8 (H) 10/14/2015   HGBA1C 6.0 07/12/2015   HGBA1C 5.9 (H) 09/13/2014    Last seen for diabetes 3 months ago.  Management since then includes none.Labs ordered. She reports {excellent/good/fair/poor:19665} compliance with treatment. She {ACTION; IS/IS XLK:[id number]} having side effects. *** Current symptoms include {Symptoms; diabetes:14075} and have been {Desc; course:15616}. Home blood sugar records: {diabetes glucometry results:16657}  Episodes of hypoglycemia? {yes***/no:17258}   Current Insulin Regimen: *** Most Recent Eye Exam: *** Weight trend: {trend:16658} Prior visit with dietician: {yes/no:17258} Current diet: {diet habits:16563} Current exercise: {exercise types:16438}  Pertinent Labs:    Component Value Date/Time   CHOL 165 10/14/2015 1141   TRIG 263 (H) 10/14/2015 1141   HDL 38 (L) 10/14/2015 1141   LDLCALC 74 10/14/2015 1141   CREATININE 0.84 10/14/2015 1141    Wt Readings from Last 3 Encounters:  04/30/16 250 lb 12.8 oz (113.8 kg)  04/13/16 258 lb (117 kg)  02/24/16 263 lb 12.8 oz (119.7 kg)    ------------------------------------------------------------------------  Lipid/Cholesterol, Follow-up:   Last seen for this3 months ago.  Management changes since that visit include none. . Last Lipid Panel:    Component Value Date/Time   CHOL 165 10/14/2015 1141   TRIG 263 (H) 10/14/2015 1141   HDL 38 (L) 10/14/2015 1141   CHOLHDL 4.3 10/14/2015 1141   CHOLHDL 4.6 07/12/2015 1923   VLDL 73 (H) 07/12/2015 1923   LDLCALC 74 10/14/2015 1141    Risk factors for vascular disease include diabetes mellitus and hypercholesterolemia  She reports excellent  compliance with treatment. She is not having side effects.  Current symptoms include {Symptoms; diabetes:14075} and have been {Desc; course:15616}.  ------------------------------------------------------------------ Bipolar Depression: Patient was seen 02/16 for grief reaction. Her husband passed away 03-30-2022 the August 16, 2022 from ALS. She was referred to a Counselor.      Allergies  Allergen Reactions  . Penicillins      Current Outpatient Prescriptions:  .  albuterol (PROVENTIL HFA;VENTOLIN HFA) 108 (90 Base) MCG/ACT inhaler, Inhale 1-2 puffs into the lungs every 4 (four) hours as needed for wheezing or shortness of breath. Every 4-6 hours as needed, Disp: 1 Inhaler, Rfl: 11 .  ALPRAZolam (XANAX) 0.5 MG tablet, Take 1 tablet (0.5 mg total) by mouth 2 (two) times daily as needed for anxiety., Disp: 60 tablet, Rfl: 3 .  aspirin 81 MG tablet, Take by mouth daily., Disp: , Rfl:  .  benztropine (COGENTIN) 0.5 MG tablet, Take 1 tablet (0.5 mg total) by mouth 2 (two) times daily. (Patient not taking: Reported on 04/30/2016), Disp: 180 tablet, Rfl: 1 .  butalbital-acetaminophen-caffeine (FIORICET WITH CODEINE) 50-325-40-30 MG capsule, Take 1 capsule by mouth every 4 (four) hours as needed for headache., Disp: 30 capsule, Rfl: 0 .  Calcium-Magnesium-Vitamin D (CALCIUM 500 PO), Take 1 tablet by mouth 2 (two) times daily., Disp: , Rfl:  .  chlorpheniramine-HYDROcodone (TUSSIONEX PENNKINETIC ER) 10-8 MG/5ML SUER, Take 5 mLs by mouth every 12 (twelve) hours as needed for cough., Disp: 240 mL, Rfl: 0 .  clonazePAM (KLONOPIN) 0.5 MG tablet, Take 1 tablet (0.5 mg total) by mouth 3 (three) times daily as needed (as needed  for anxiety). (Patient not taking: Reported on 02/24/2016), Disp: 15 tablet, Rfl: 0 .  divalproex (DEPAKOTE ER) 500 MG 24 hr tablet, Take 2 tablets (1,000 mg total) by mouth at bedtime., Disp: 180 tablet, Rfl: 1 .  fluticasone (FLONASE) 50 MCG/ACT nasal spray, Place 2 sprays into the nose daily.,  Disp: , Rfl:  .  HYDROcodone-acetaminophen (NORCO) 5-325 MG tablet, Take 1 tablet by mouth every 6 (six) hours as needed for moderate pain., Disp: 20 tablet, Rfl: 0 .  levofloxacin (LEVAQUIN) 500 MG tablet, Take 1 tablet (500 mg total) by mouth daily., Disp: 10 tablet, Rfl: 0 .  metFORMIN (GLUCOPHAGE) 500 MG tablet, Take 1 tablet (500 mg total) by mouth 2 (two) times daily with a meal., Disp: 180 tablet, Rfl: 1 .  montelukast (SINGULAIR) 10 MG tablet, Take 1 tablet (10 mg total) by mouth daily., Disp: 90 tablet, Rfl: 3 .  oseltamivir (TAMIFLU) 75 MG capsule, Take 1 capsule (75 mg total) by mouth 2 (two) times daily., Disp: 10 capsule, Rfl: 0 .  predniSONE (DELTASONE) 10 MG tablet, Take 6 tabs PO on day 1&2, 5 tabs PO on day 3&4, 4 tabs PO on day 5&6, 3 tabs PO on day 7&8, 2 tabs PO on day 9&10, 1 tab PO on day 11&12., Disp: 42 tablet, Rfl: 0 .  Umeclidinium Bromide (INCRUSE ELLIPTA) 62.5 MCG/INH AEPB, Inhale 1 puff into the lungs daily., Disp: 30 each, Rfl: 13  Review of Systems  Social History  Substance Use Topics  . Smoking status: Never Smoker  . Smokeless tobacco: Never Used  . Alcohol use 0.0 oz/week     Comment: 1/2 ca of beer every 2 weeks   Objective:   There were no vitals taken for this visit.   Physical Exam      Assessment & Plan:           Margaretann LovelessJennifer M Burnette, PA-C  [facility]

## 2016-08-13 ENCOUNTER — Ambulatory Visit (INDEPENDENT_AMBULATORY_CARE_PROVIDER_SITE_OTHER): Admitting: Physician Assistant

## 2016-08-13 ENCOUNTER — Encounter: Payer: Self-pay | Admitting: Physician Assistant

## 2016-08-13 VITALS — BP 142/80 | HR 104 | Temp 98.1°F | Resp 16 | Wt 260.0 lb

## 2016-08-13 DIAGNOSIS — F319 Bipolar disorder, unspecified: Secondary | ICD-10-CM

## 2016-08-13 DIAGNOSIS — R81 Glycosuria: Secondary | ICD-10-CM | POA: Diagnosis not present

## 2016-08-13 DIAGNOSIS — E119 Type 2 diabetes mellitus without complications: Secondary | ICD-10-CM

## 2016-08-13 DIAGNOSIS — R300 Dysuria: Secondary | ICD-10-CM

## 2016-08-13 DIAGNOSIS — F3130 Bipolar disorder, current episode depressed, mild or moderate severity, unspecified: Secondary | ICD-10-CM | POA: Diagnosis not present

## 2016-08-13 DIAGNOSIS — J4530 Mild persistent asthma, uncomplicated: Secondary | ICD-10-CM

## 2016-08-13 DIAGNOSIS — N3000 Acute cystitis without hematuria: Secondary | ICD-10-CM

## 2016-08-13 DIAGNOSIS — J301 Allergic rhinitis due to pollen: Secondary | ICD-10-CM

## 2016-08-13 LAB — POCT URINALYSIS DIPSTICK
Bilirubin, UA: NEGATIVE
Glucose, UA: 2000
Ketones, UA: NEGATIVE
NITRITE UA: NEGATIVE
PH UA: 6 (ref 5.0–8.0)
Spec Grav, UA: >=1.03 — AB (ref 1.010–1.025)
UROBILINOGEN UA: 0.2 U/dL

## 2016-08-13 MED ORDER — MONTELUKAST SODIUM 10 MG PO TABS: 10.0000 mg | ORAL_TABLET | Freq: Every day | ORAL | 3 refills | Status: DC

## 2016-08-13 MED ORDER — METFORMIN HCL 500 MG PO TABS
500.0000 mg | ORAL_TABLET | Freq: Two times a day (BID) | ORAL | 1 refills | Status: DC
Start: 2016-08-13 — End: 2016-12-15

## 2016-08-13 MED ORDER — BENZTROPINE MESYLATE 0.5 MG PO TABS: 0.5000 mg | ORAL_TABLET | Freq: Two times a day (BID) | ORAL | 3 refills | Status: DC

## 2016-08-13 MED ORDER — NITROFURANTOIN MONOHYD MACRO 100 MG PO CAPS: 100.0000 mg | ORAL_CAPSULE | Freq: Two times a day (BID) | ORAL | 0 refills | Status: DC

## 2016-08-13 MED ORDER — UMECLIDINIUM BROMIDE 62.5 MCG/INH IN AEPB: 1.0000 | INHALATION_SPRAY | Freq: Every day | RESPIRATORY_TRACT | 11 refills | Status: DC

## 2016-08-13 MED ORDER — DIVALPROEX SODIUM ER 500 MG PO TB24: 1000.0000 mg | ORAL_TABLET | Freq: Every day | ORAL | 3 refills | Status: DC

## 2016-08-13 MED ORDER — ALPRAZOLAM 0.5 MG PO TABS: 0.5000 mg | ORAL_TABLET | Freq: Two times a day (BID) | ORAL | 5 refills | Status: DC | PRN

## 2016-08-13 NOTE — Patient Instructions (Signed)

## 2016-08-13 NOTE — Progress Notes (Signed)
Patient: Joanna Bishop Female    DOB: 04/23/1955   61 y.o.   MRN: [id number] Visit Date: 08/13/2016  Today's Provider: Margaretann LovelessJennifer M Burnette, PA-C   Chief Complaint  Patient presents with  . Urinary Tract Infection   Subjective:    Urinary Tract Infection   This is a new problem. The current episode started in the past 7 days (about 3 days). The problem has been gradually worsening. The pain is moderate. The maximum temperature recorded prior to her arrival was 100 - 100.9 F. The fever has been present for less than 1 day. Associated symptoms include flank pain. Pertinent negatives include no chills, hematuria, nausea or vomiting. She has tried NSAIDs and acetaminophen (has also taken AZO over the counter) for the symptoms. The treatment provided mild relief.   Patient has not been taking her metformin. UA today in the office showed she had 2000 glucose. A1c attempted in the office but read low hemoglobin. Lab slip given to patient.    Allergies  Allergen Reactions  . Penicillins      Current Outpatient Prescriptions:  .  albuterol (PROVENTIL HFA;VENTOLIN HFA) 108 (90 Base) MCG/ACT inhaler, Inhale 1-2 puffs into the lungs every 4 (four) hours as needed for wheezing or shortness of breath. Every 4-6 hours as needed, Disp: 1 Inhaler, Rfl: 11 .  ALPRAZolam (XANAX) 0.5 MG tablet, Take 1 tablet (0.5 mg total) by mouth 2 (two) times daily as needed for anxiety., Disp: 60 tablet, Rfl: 3 .  aspirin 81 MG tablet, Take by mouth daily., Disp: , Rfl:  .  butalbital-acetaminophen-caffeine (FIORICET WITH CODEINE) 50-325-40-30 MG capsule, Take 1 capsule by mouth every 4 (four) hours as needed for headache., Disp: 30 capsule, Rfl: 0 .  Calcium-Magnesium-Vitamin D (CALCIUM 500 PO), Take 1 tablet by mouth 2 (two) times daily., Disp: , Rfl:  .  divalproex (DEPAKOTE ER) 500 MG 24 hr tablet, Take 2 tablets (1,000 mg total) by mouth at bedtime., Disp: 180 tablet, Rfl: 1 .  fluticasone (FLONASE) 50  MCG/ACT nasal spray, Place 2 sprays into the nose daily., Disp: , Rfl:  .  HYDROcodone-acetaminophen (NORCO) 5-325 MG tablet, Take 1 tablet by mouth every 6 (six) hours as needed for moderate pain., Disp: 20 tablet, Rfl: 0 .  metFORMIN (GLUCOPHAGE) 500 MG tablet, Take 1 tablet (500 mg total) by mouth 2 (two) times daily with a meal., Disp: 180 tablet, Rfl: 1 .  montelukast (SINGULAIR) 10 MG tablet, Take 1 tablet (10 mg total) by mouth daily., Disp: 90 tablet, Rfl: 3 .  Umeclidinium Bromide (INCRUSE ELLIPTA) 62.5 MCG/INH AEPB, Inhale 1 puff into the lungs daily., Disp: 30 each, Rfl: 13 .  benztropine (COGENTIN) 0.5 MG tablet, Take 1 tablet (0.5 mg total) by mouth 2 (two) times daily. (Patient not taking: Reported on 04/30/2016), Disp: 180 tablet, Rfl: 1 .  chlorpheniramine-HYDROcodone (TUSSIONEX PENNKINETIC ER) 10-8 MG/5ML SUER, Take 5 mLs by mouth every 12 (twelve) hours as needed for cough. (Patient not taking: Reported on 08/13/2016), Disp: 240 mL, Rfl: 0 .  clonazePAM (KLONOPIN) 0.5 MG tablet, Take 1 tablet (0.5 mg total) by mouth 3 (three) times daily as needed (as needed for anxiety). (Patient not taking: Reported on 02/24/2016), Disp: 15 tablet, Rfl: 0 .  levofloxacin (LEVAQUIN) 500 MG tablet, Take 1 tablet (500 mg total) by mouth daily. (Patient not taking: Reported on 08/13/2016), Disp: 10 tablet, Rfl: 0 .  oseltamivir (TAMIFLU) 75 MG capsule, Take 1 capsule (75 mg  total) by mouth 2 (two) times daily. (Patient not taking: Reported on 08/13/2016), Disp: 10 capsule, Rfl: 0 .  predniSONE (DELTASONE) 10 MG tablet, Take 6 tabs PO on day 1&2, 5 tabs PO on day 3&4, 4 tabs PO on day 5&6, 3 tabs PO on day 7&8, 2 tabs PO on day 9&10, 1 tab PO on day 11&12. (Patient not taking: Reported on 08/13/2016), Disp: 42 tablet, Rfl: 0  Review of Systems  Constitutional: Positive for fever. Negative for chills and fatigue.  Respiratory: Negative.   Cardiovascular: Negative.   Gastrointestinal: Negative for abdominal  pain, nausea and vomiting.  Genitourinary: Positive for dysuria and flank pain. Negative for hematuria.  Musculoskeletal: Negative for back pain.    Social History  Substance Use Topics  . Smoking status: Never Smoker  . Smokeless tobacco: Never Used  . Alcohol use 0.0 oz/week     Comment: 1/2 ca of beer every 2 weeks   Objective:   BP (!) 142/80 (BP Location: Right Arm, Patient Position: Sitting, Cuff Size: Large)   Pulse (!) 104   Temp 98.1 F (36.7 C)   Resp 16   Wt 260 lb (117.9 kg)   SpO2 95%   BMI 44.63 kg/m  Vitals:   08/13/16 1557  BP: (!) 142/80  Pulse: (!) 104  Resp: 16  Temp: 98.1 F (36.7 C)  SpO2: 95%  Weight: 260 lb (117.9 kg)     Physical Exam  Constitutional: She is oriented to person, place, and time. She appears well-developed and well-nourished. No distress.  Cardiovascular: Normal rate, regular rhythm and normal heart sounds.  Exam reveals no gallop and no friction rub.   No murmur heard. Pulmonary/Chest: Effort normal and breath sounds normal. No respiratory distress. She has no wheezes. She has no rales.  Abdominal: Soft. Normal appearance and bowel sounds are normal. She exhibits no distension and no mass. There is no hepatosplenomegaly. There is tenderness in the suprapubic area. There is no rebound, no guarding and no CVA tenderness.  Suprapubic pressure  Neurological: She is alert and oriented to person, place, and time.  Skin: Skin is warm and dry. She is not diaphoretic.  Vitals reviewed.       Assessment & Plan:     1. Acute cystitis without hematuria Worsening symptoms. UA positive. Will treat empirically with Macrobid as below.  Continue to push fluids. Urine sent for culture. Will follow up pending C&S results. She is to call if symptoms do not improve or if they worsen.  - Urine culture - nitrofurantoin, macrocrystal-monohydrate, (MACROBID) 100 MG capsule; Take 1 capsule (100 mg total) by mouth 2 (two) times daily.  Dispense: 20  capsule; Refill: 0  2. Dysuria UA had trace leuks, but was positive for 2000 glucose.  - POCT urinalysis dipstick  3. Glucosuria Patient admits to not taking metformin. A1c had been controlled previously. Will check A1c and f/u pending results.   4. Type 2 diabetes mellitus without complication, without long-term current use of insulin (HCC) Will check labs as below and f/u pending results. A1c came back elevated at 7.9, up from 6.8 previously. Metformin refilled and will add glipizide XR 10mg  as below. I will see her back in 3 months to recheck labs.  - metFORMIN (GLUCOPHAGE) 500 MG tablet; Take 1 tablet (500 mg total) by mouth 2 (two) times daily with a meal.  Dispense: 180 tablet; Refill: 1 - CBC with Differential - HgB A1c - glipiZIDE (GLUCOTROL XL) 10 MG 24 hr  tablet; Take 1 tablet (10 mg total) by mouth daily with breakfast.  Dispense: 90 tablet; Refill: 1  5. AB (asthmatic bronchitis), mild persistent, uncomplicated Stable. Diagnosis pulled for medication refill. Continue current medical treatment plan. - umeclidinium bromide (INCRUSE ELLIPTA) 62.5 MCG/INH AEPB; Inhale 1 puff into the lungs daily.  Dispense: 30 each; Refill: 11  6. Seasonal allergic rhinitis due to pollen Stable. Diagnosis pulled for medication refill. Continue current medical treatment plan. - montelukast (SINGULAIR) 10 MG tablet; Take 1 tablet (10 mg total) by mouth daily.  Dispense: 90 tablet; Refill: 3  7. Bipolar depression (HCC) Stable. Diagnosis pulled for medication refill. Continue current medical treatment plan. - divalproex (DEPAKOTE ER) 500 MG 24 hr tablet; Take 2 tablets (1,000 mg total) by mouth at bedtime.  Dispense: 180 tablet; Refill: 3 - ALPRAZolam (XANAX) 0.5 MG tablet; Take 1 tablet (0.5 mg total) by mouth 2 (two) times daily as needed for anxiety.  Dispense: 60 tablet; Refill: 5 - benztropine (COGENTIN) 0.5 MG tablet; Take 1 tablet (0.5 mg total) by mouth 2 (two) times daily.  Dispense: 180  tablet; Refill: 3       Margaretann Loveless, PA-C  [facility]

## 2016-08-14 LAB — CBC WITH DIFFERENTIAL/PLATELET
BASOS: 1 %
Basophils Absolute: 0.1 10*3/uL (ref 0.0–0.2)
EOS (ABSOLUTE): 0.4 10*3/uL (ref 0.0–0.4)
EOS: 5 %
HEMATOCRIT: 45.1 % (ref 34.0–46.6)
Hemoglobin: 15.3 g/dL (ref 11.1–15.9)
Immature Grans (Abs): 0 10*3/uL (ref 0.0–0.1)
Immature Granulocytes: 0 %
Lymphocytes Absolute: 2.5 10*3/uL (ref 0.7–3.1)
Lymphs: 28 %
MCH: 30 pg (ref 26.6–33.0)
MCHC: 33.9 g/dL (ref 31.5–35.7)
MCV: 88 fL (ref 79–97)
MONOS ABS: 0.8 10*3/uL (ref 0.1–0.9)
Monocytes: 9 %
Neutrophils Absolute: 5.1 10*3/uL (ref 1.4–7.0)
Neutrophils: 57 %
Platelets: 220 10*3/uL (ref 150–379)
RBC: 5.1 x10E6/uL (ref 3.77–5.28)
RDW: 13.2 % (ref 12.3–15.4)
WBC: 8.9 10*3/uL (ref 3.4–10.8)

## 2016-08-14 LAB — HEMOGLOBIN A1C
Est. average glucose Bld gHb Est-mCnc: 180 mg/dL
Hgb A1c MFr Bld: 7.9 % — ABNORMAL HIGH (ref 4.8–5.6)

## 2016-08-14 MED ORDER — GLIPIZIDE ER 10 MG PO TB24: 10.0000 mg | ORAL_TABLET | Freq: Every day | ORAL | 1 refills | Status: DC

## 2016-08-15 ENCOUNTER — Telehealth: Payer: Self-pay

## 2016-08-15 LAB — URINE CULTURE: ORGANISM ID, BACTERIA: NO GROWTH

## 2016-08-15 NOTE — Telephone Encounter (Signed)
Patient advised as below.  

## 2016-08-15 NOTE — Telephone Encounter (Signed)
-----   Message from Margaretann LovelessJennifer M Burnette, PA-C sent at 08/15/2016  8:57 AM EDT ----- Urine culture was negative

## 2016-10-08 ENCOUNTER — Encounter: Payer: Self-pay | Admitting: Physician Assistant

## 2016-10-08 ENCOUNTER — Ambulatory Visit (INDEPENDENT_AMBULATORY_CARE_PROVIDER_SITE_OTHER): Admitting: Physician Assistant

## 2016-10-08 VITALS — BP 150/100 | HR 100 | Temp 98.3°F | Resp 20 | Wt 269.0 lb

## 2016-10-08 DIAGNOSIS — R05 Cough: Secondary | ICD-10-CM | POA: Diagnosis not present

## 2016-10-08 DIAGNOSIS — J4541 Moderate persistent asthma with (acute) exacerbation: Secondary | ICD-10-CM | POA: Diagnosis not present

## 2016-10-08 DIAGNOSIS — R059 Cough, unspecified: Secondary | ICD-10-CM

## 2016-10-08 MED ORDER — PREDNISONE 10 MG PO TABS: ORAL_TABLET | ORAL | 0 refills | Status: DC

## 2016-10-08 MED ORDER — HYDROCODONE-HOMATROPINE 5-1.5 MG/5ML PO SYRP: 5.0000 mL | ORAL_SOLUTION | Freq: Three times a day (TID) | ORAL | 0 refills | Status: DC | PRN

## 2016-10-08 MED ORDER — AZITHROMYCIN 250 MG PO TABS: ORAL_TABLET | ORAL | 0 refills | Status: DC

## 2016-10-08 NOTE — Patient Instructions (Signed)
Bronchospasm, Adult Bronchospasm is a tightening of the airways going into the lungs. During an episode, it may be harder to breathe. You may cough, and you may make a whistling sound when you breathe (wheeze). This condition often affects people with asthma. What are the causes? This condition is caused by swelling and irritation in the airways. It can be triggered by:  An infection (common).  Seasonal allergies.  An allergic reaction.  Exercise.  Irritants. These include pollution, cigarette smoke, strong odors, aerosol sprays, and paint fumes.  Weather changes. Winds increase molds and pollens in the air. Cold air may cause swelling.  Stress and emotional upset.  What are the signs or symptoms? Symptoms of this condition include:  Wheezing. If the episode was triggered by an allergy, wheezing may start right away or hours later.  Nighttime coughing.  Frequent or severe coughing with a simple cold.  Chest tightness.  Shortness of breath.  Decreased ability to exercise.  How is this diagnosed? This condition is usually diagnosed with a review of your medical history and a physical exam. Tests, such as lung function tests, are sometimes done to look for other conditions. The need for a chest X-ray depends on where the wheezing occurs and whether it is the first time you have wheezed. How is this treated? This condition may be treated with:  Inhaled medicines. These open up the airways and help you breathe. They can be taken with an inhaler or a nebulizer device.  Corticosteroid medicines. These may be given for severe bronchospasm, usually when it is associated with asthma.  Avoiding triggers, such as irritants, infection, or allergies.  Follow these instructions at home: Medicines  Take over-the-counter and prescription medicines only as told by your health care provider.  If you need to use an inhaler or nebulizer to take your medicine, ask your health care  provider to explain how to use it correctly. If you were given a spacer, always use it with your inhaler. Lifestyle  Reduce the number of triggers in your home. To do this: ? Change your heating and air conditioning filter at least once a month. ? Limit your use of fireplaces and wood stoves. ? Do not smoke. Do not allow smoking in your home. ? Avoid using perfumes and fragrances. ? Get rid of pests, such as roaches and mice, and their droppings. ? Remove any mold from your home. ? Keep your house clean and dust free. Use unscented cleaning products. ? Replace carpet with wood, tile, or vinyl flooring. Carpet can trap dander and dust. ? Use allergy-proof pillows, mattress covers, and box spring covers. ? Wash bed sheets and blankets every week in hot water. Dry them in a dryer. ? Use blankets that are made of polyester or cotton. ? Wash your hands often. ? Do not allow pets in your bedroom.  Avoid breathing in cold air when you exercise. General instructions  Have a plan for seeking medical care. Know when to call your health care provider and local emergency services, and where to get emergency care.  Stay up to date on your immunizations.  When you have an episode of bronchospasm, stay calm. Try to relax and breathe more slowly.  If you have asthma, make sure you have an asthma action plan.  Keep all follow-up visits as told by your health care provider. This is important. Contact a health care provider if:  You have muscle aches.  You have chest pain.  The mucus that you   cough up (sputum) changes from clear or white to yellow, green, gray, or bloody.  You have a fever.  Your sputum gets thicker. Get help right away if:  Your wheezing and coughing get worse, even after you take your prescribed medicines.  It gets even harder to breathe.  You develop severe chest pain. Summary  Bronchospasm is a tightening of the airways going into the lungs.  During an episode of  bronchospasm, you may have a harder time breathing. You may cough and make a whistling sound when you breathe (wheeze).  Avoid exposure to triggers such as smoke, dust, mold, animal dander, and fragrances.  When you have an episode of bronchospasm, stay calm. Try to relax and breathe more slowly. This information is not intended to replace advice given to you by your health care provider. Make sure you discuss any questions you have with your health care provider. Document Released: 02/15/2003 Document Revised: 02/09/2016 Document Reviewed: 02/09/2016 Elsevier Interactive Patient Education  2017 Elsevier Inc.  

## 2016-10-08 NOTE — Progress Notes (Signed)
Patient: Joanna Bishop Female    DOB: 04/09/1955   61 y.o.   MRN: [id number] Visit Date: 10/08/2016  Today's Provider: Margaretann Loveless, PA-C   Chief Complaint  Patient presents with  . Cough   Subjective:    HPI Upper Respiratory Infection: Patient complains of symptoms of a URI. Symptoms include cough. Onset of symptoms was 3 days ago, gradually improving since that time. She also c/o productive cough with  yellow colored sputum for the past 1 day .  She is drinking plenty of fluids. Evaluation to date: none. Treatment to date: albuterol inhaler and incruse daily.     Allergies  Allergen Reactions  . Penicillins      Current Outpatient Prescriptions:  .  albuterol (PROVENTIL HFA;VENTOLIN HFA) 108 (90 Base) MCG/ACT inhaler, Inhale 1-2 puffs into the lungs every 4 (four) hours as needed for wheezing or shortness of breath. Every 4-6 hours as needed, Disp: 1 Inhaler, Rfl: 11 .  ALPRAZolam (XANAX) 0.5 MG tablet, Take 1 tablet (0.5 mg total) by mouth 2 (two) times daily as needed for anxiety., Disp: 60 tablet, Rfl: 5 .  aspirin 81 MG tablet, Take by mouth daily., Disp: , Rfl:  .  benztropine (COGENTIN) 0.5 MG tablet, Take 1 tablet (0.5 mg total) by mouth 2 (two) times daily., Disp: 180 tablet, Rfl: 3 .  butalbital-acetaminophen-caffeine (FIORICET WITH CODEINE) 50-325-40-30 MG capsule, Take 1 capsule by mouth every 4 (four) hours as needed for headache., Disp: 30 capsule, Rfl: 0 .  Calcium-Magnesium-Vitamin D (CALCIUM 500 PO), Take 1 tablet by mouth 2 (two) times daily., Disp: , Rfl:  .  divalproex (DEPAKOTE ER) 500 MG 24 hr tablet, Take 2 tablets (1,000 mg total) by mouth at bedtime., Disp: 180 tablet, Rfl: 3 .  fluticasone (FLONASE) 50 MCG/ACT nasal spray, Place 2 sprays into the nose daily., Disp: , Rfl:  .  glipiZIDE (GLUCOTROL XL) 10 MG 24 hr tablet, Take 1 tablet (10 mg total) by mouth daily with breakfast., Disp: 90 tablet, Rfl: 1 .  metFORMIN (GLUCOPHAGE) 500 MG  tablet, Take 1 tablet (500 mg total) by mouth 2 (two) times daily with a meal., Disp: 180 tablet, Rfl: 1 .  montelukast (SINGULAIR) 10 MG tablet, Take 1 tablet (10 mg total) by mouth daily., Disp: 90 tablet, Rfl: 3 .  umeclidinium bromide (INCRUSE ELLIPTA) 62.5 MCG/INH AEPB, Inhale 1 puff into the lungs daily., Disp: 30 each, Rfl: 11 .  nitrofurantoin, macrocrystal-monohydrate, (MACROBID) 100 MG capsule, Take 1 capsule (100 mg total) by mouth 2 (two) times daily. (Patient not taking: Reported on 10/08/2016), Disp: 20 capsule, Rfl: 0  Review of Systems  Constitutional: Negative.   HENT: Negative.   Respiratory: Positive for cough and wheezing.   Cardiovascular: Negative.   Gastrointestinal: Negative.   Genitourinary: Negative.     Social History  Substance Use Topics  . Smoking status: Never Smoker  . Smokeless tobacco: Never Used  . Alcohol use 0.0 oz/week     Comment: 1/2 ca of beer every 2 weeks   Objective:   BP (!) 150/100 (BP Location: Left Arm, Patient Position: Sitting, Cuff Size: Large)   Pulse 100   Temp 98.3 F (36.8 C) (Oral)   Resp 20   Wt 269 lb (122 kg)   SpO2 94%   BMI 46.17 kg/m  Vitals:   10/08/16 1420  BP: (!) 150/100  Pulse: 100  Resp: 20  Temp: 98.3 F (36.8 C)  TempSrc: Oral  SpO2: 94%  Weight: 269 lb (122 kg)     Physical Exam  Constitutional: She appears well-developed and well-nourished. No distress.  HENT:  Head: Normocephalic and atraumatic.  Right Ear: Hearing, tympanic membrane, external ear and ear canal normal.  Left Ear: Hearing, tympanic membrane, external ear and ear canal normal.  Nose: Nose normal.  Mouth/Throat: Uvula is midline, oropharynx is clear and moist and mucous membranes are normal. No oropharyngeal exudate.  Eyes: Pupils are equal, round, and reactive to light. Conjunctivae are normal. Right eye exhibits no discharge. Left eye exhibits no discharge. No scleral icterus.  Neck: Normal range of motion. Neck supple. No  tracheal deviation present. No thyromegaly present.  Cardiovascular: Normal rate, regular rhythm and normal heart sounds.  Exam reveals no gallop and no friction rub.   No murmur heard. Pulmonary/Chest: Effort normal. No stridor. No respiratory distress. She has decreased breath sounds (throughout). She has no wheezes. She has no rhonchi. She has no rales.  Lymphadenopathy:    She has no cervical adenopathy.  Skin: Skin is warm and dry. She is not diaphoretic.  Vitals reviewed.       Assessment & Plan:     1. Moderate persistent asthmatic bronchitis with acute exacerbation Patient has a lot of bronchospasm and is not moving much air even though she is compensating well with pulse ox. Offered breathing treatment today in the office but patient declined. Will treat with zpak and prednisone as below. Hycodan given for cough. Push fluids. Call if no improvement. - azithromycin (ZITHROMAX) 250 MG tablet; Take 2 tablets PO on day one, and one tablet PO daily thereafter until completed.  Dispense: 6 tablet; Refill: 0 - predniSONE (DELTASONE) 10 MG tablet; Take 6 tabs PO on day 1&2, 5 tabs PO on day 3&4, 4 tabs PO on day 5&6, 3 tabs PO on day 7&8, 2 tabs PO on day 9&10, 1 tab PO on day 11&12.  Dispense: 42 tablet; Refill: 0  2. Cough Worsening symptoms that has not responded to OTC medications. Will give Hycodan cough syrup as below for nighttime cough. Drowsiness precautions given to patient. Stay well hydrated. Use delsym, robitussin OR mucinex for daytime cough. - HYDROcodone-homatropine (HYCODAN) 5-1.5 MG/5ML syrup; Take 5 mLs by mouth every 8 (eight) hours as needed for cough.  Dispense: 120 mL; Refill: 0       Margaretann LovelessJennifer M Burnette, PA-C  [facility]

## 2016-10-24 ENCOUNTER — Telehealth: Payer: Self-pay | Admitting: Physician Assistant

## 2016-10-24 DIAGNOSIS — J4541 Moderate persistent asthma with (acute) exacerbation: Secondary | ICD-10-CM

## 2016-10-24 MED ORDER — PREDNISONE 10 MG PO TABS: ORAL_TABLET | ORAL | 0 refills | Status: DC

## 2016-10-24 MED ORDER — AZITHROMYCIN 250 MG PO TABS: ORAL_TABLET | ORAL | 0 refills | Status: DC

## 2016-10-24 NOTE — Telephone Encounter (Signed)
Pt states she still has a bad cough and is requesting another Rx for an antibiotics.  [facility].  CB#[phone]/MW

## 2016-10-24 NOTE — Telephone Encounter (Signed)
[facility]

## 2016-10-24 NOTE — Telephone Encounter (Signed)
Patient advised.  Thanks,  -Joseline 

## 2016-10-24 NOTE — Telephone Encounter (Signed)
Please Review.  Thanks,  -Joseline 

## 2016-11-07 ENCOUNTER — Telehealth: Payer: Self-pay

## 2016-11-07 DIAGNOSIS — J4541 Moderate persistent asthma with (acute) exacerbation: Secondary | ICD-10-CM

## 2016-11-07 NOTE — Telephone Encounter (Signed)
Please Review.  Thanks,  -Joseline 

## 2016-11-07 NOTE — Telephone Encounter (Signed)
Patient states she has completed both courses of antibiotic. The cough and chest tightness has returned. She would like to know recommendations? Patient [place] she [place] seen [place] times for the same symptoms. Please advise.   CB# [phone]

## 2016-11-08 ENCOUNTER — Encounter: Payer: Self-pay | Admitting: Physician Assistant

## 2016-11-08 MED ORDER — PREDNISONE 10 MG PO TABS: ORAL_TABLET | ORAL | 0 refills | Status: DC

## 2016-11-08 NOTE — Telephone Encounter (Signed)
Will send in another prednisone taper. Is patient using Incruse? Once patient better may need to see her and discuss getting her a nebulizer to have at home and may need to change inhalers.

## 2016-11-08 NOTE — Telephone Encounter (Signed)
Patient was notified. Patient is still using Incruse.

## 2016-12-15 ENCOUNTER — Other Ambulatory Visit: Payer: Self-pay | Admitting: Physician Assistant

## 2016-12-15 DIAGNOSIS — E119 Type 2 diabetes mellitus without complications: Secondary | ICD-10-CM

## 2016-12-15 NOTE — Telephone Encounter (Signed)
Pharmacy requesting refills. Thanks!  

## 2017-02-04 ENCOUNTER — Other Ambulatory Visit: Payer: Self-pay | Admitting: Physician Assistant

## 2017-02-04 DIAGNOSIS — E119 Type 2 diabetes mellitus without complications: Secondary | ICD-10-CM

## 2017-02-28 ENCOUNTER — Ambulatory Visit (INDEPENDENT_AMBULATORY_CARE_PROVIDER_SITE_OTHER): Admitting: Physician Assistant

## 2017-02-28 ENCOUNTER — Encounter: Payer: Self-pay | Admitting: Physician Assistant

## 2017-02-28 VITALS — BP 160/100 | HR 95 | Temp 98.3°F | Resp 16 | Wt 263.4 lb

## 2017-02-28 DIAGNOSIS — R05 Cough: Secondary | ICD-10-CM | POA: Diagnosis not present

## 2017-02-28 DIAGNOSIS — J4541 Moderate persistent asthma with (acute) exacerbation: Secondary | ICD-10-CM | POA: Diagnosis not present

## 2017-02-28 DIAGNOSIS — R059 Cough, unspecified: Secondary | ICD-10-CM

## 2017-02-28 MED ORDER — DOXYCYCLINE HYCLATE 100 MG PO TABS: 100.0000 mg | ORAL_TABLET | Freq: Two times a day (BID) | ORAL | 0 refills | Status: DC

## 2017-02-28 MED ORDER — HYDROCODONE-HOMATROPINE 5-1.5 MG/5ML PO SYRP: 5.0000 mL | ORAL_SOLUTION | Freq: Three times a day (TID) | ORAL | 0 refills | Status: DC | PRN

## 2017-02-28 MED ORDER — PREDNISONE 10 MG PO TABS: ORAL_TABLET | ORAL | 0 refills | Status: DC

## 2017-02-28 NOTE — Patient Instructions (Signed)
Upper Respiratory Infection, Adult Most upper respiratory infections (URIs) are caused by a virus. A URI affects the nose, throat, and upper air passages. The most common type of URI is often called "the common cold." Follow these instructions at home:  Take medicines only as told by your doctor.  Gargle warm saltwater or take cough drops to comfort your throat as told by your doctor.  Use a warm mist humidifier or inhale steam from a shower to increase air moisture. This may make it easier to breathe.  Drink enough fluid to keep your pee (urine) clear or pale yellow.  Eat soups and other clear broths.  Have a healthy diet.  Rest as needed.  Go back to work when your fever is gone or your doctor says it is okay. ? You may need to stay home longer to avoid giving your URI to others. ? You can also wear a face mask and wash your hands often to prevent spread of the virus.  Use your inhaler more if you have asthma.  Do not use any tobacco products, including cigarettes, chewing tobacco, or electronic cigarettes. If you need help quitting, ask your doctor. Contact a doctor if:  You are getting worse, not better.  Your symptoms are not helped by medicine.  You have chills.  You are getting more short of breath.  You have brown or red mucus.  You have yellow or brown discharge from your nose.  You have pain in your face, especially when you bend forward.  You have a fever.  You have puffy (swollen) neck glands.  You have pain while swallowing.  You have white areas in the back of your throat. Get help right away if:  You have very bad or constant: ? Headache. ? Ear pain. ? Pain in your forehead, behind your eyes, and over your cheekbones (sinus pain). ? Chest pain.  You have long-lasting (chronic) lung disease and any of the following: ? Wheezing. ? Long-lasting cough. ? Coughing up blood. ? A change in your usual mucus.  You have a stiff neck.  You have  changes in your: ? Vision. ? Hearing. ? Thinking. ? Mood. This information is not intended to replace advice given to you by your health care provider. Make sure you discuss any questions you have with your health care provider. Document Released: 08/01/2007 Document Revised: 10/16/2015 Document Reviewed: 05/20/2013 Elsevier Interactive Patient Education  2018 [employer]  

## 2017-02-28 NOTE — Progress Notes (Signed)
Patient: Joanna Bishop Female    DOB: June 06, 1955   62 y.o.   MRN: [id number] Visit Date: 02/28/2017  Today's Provider: Margaretann Loveless, PA-C   Chief Complaint  Patient presents with  . URI   Subjective:    URI   This is a new problem. The current episode started 1 to 4 weeks ago (Last Wednesday). The problem has been gradually worsening. There has been no fever. Associated symptoms include congestion, coughing, headaches and wheezing. Pertinent negatives include no abdominal pain, chest pain, diarrhea, ear pain, nausea, plugged ear sensation, rhinorrhea, sinus pain, sneezing, sore throat, swollen glands or vomiting. She has tried inhaler use (Hycodan-she just started using this Tuesday night., from old prescription.) for the symptoms. The treatment provided no relief.   Reports eye exam: 09/2016    Allergies  Allergen Reactions  . Penicillins      Current Outpatient Medications:  .  albuterol (PROVENTIL HFA;VENTOLIN HFA) 108 (90 Base) MCG/ACT inhaler, Inhale 1-2 puffs into the lungs every 4 (four) hours as needed for wheezing or shortness of breath. Every 4-6 hours as needed, Disp: 1 Inhaler, Rfl: 11 .  ALPRAZolam (XANAX) 0.5 MG tablet, Take 1 tablet (0.5 mg total) by mouth 2 (two) times daily as needed for anxiety., Disp: 60 tablet, Rfl: 5 .  aspirin 81 MG tablet, Take by mouth daily., Disp: , Rfl:  .  benztropine (COGENTIN) 0.5 MG tablet, Take 1 tablet (0.5 mg total) by mouth 2 (two) times daily., Disp: 180 tablet, Rfl: 3 .  Calcium-Magnesium-Vitamin D (CALCIUM 500 PO), Take 1 tablet by mouth 2 (two) times daily., Disp: , Rfl:  .  divalproex (DEPAKOTE ER) 500 MG 24 hr tablet, Take 2 tablets (1,000 mg total) by mouth at bedtime., Disp: 180 tablet, Rfl: 3 .  glipiZIDE (GLUCOTROL XL) 10 MG 24 hr tablet, TAKE 1 TABLET (10 MG TOTAL) BY MOUTH DAILY WITH BREAKFAST., Disp: 90 tablet, Rfl: 1 .  HYDROcodone-homatropine (HYCODAN) 5-1.5 MG/5ML syrup, Take 5 mLs by mouth every 8  (eight) hours as needed for cough., Disp: 120 mL, Rfl: 0 .  metFORMIN (GLUCOPHAGE) 500 MG tablet, TAKE 1 TABLET (500 MG TOTAL) BY MOUTH 2 (TWO) TIMES DAILY WITH A MEAL., Disp: 180 tablet, Rfl: 1 .  montelukast (SINGULAIR) 10 MG tablet, Take 1 tablet (10 mg total) by mouth daily., Disp: 90 tablet, Rfl: 3 .  umeclidinium bromide (INCRUSE ELLIPTA) 62.5 MCG/INH AEPB, Inhale 1 puff into the lungs daily., Disp: 30 each, Rfl: 11 .  azithromycin (ZITHROMAX) 250 MG tablet, Take 2 tablets PO on day one, and one tablet PO daily thereafter until completed. (Patient not taking: Reported on 02/28/2017), Disp: 6 tablet, Rfl: 0 .  butalbital-acetaminophen-caffeine (FIORICET WITH CODEINE) 50-325-40-30 MG capsule, Take 1 capsule by mouth every 4 (four) hours as needed for headache. (Patient not taking: Reported on 02/28/2017), Disp: 30 capsule, Rfl: 0 .  fluticasone (FLONASE) 50 MCG/ACT nasal spray, Place 2 sprays into the nose daily., Disp: , Rfl:  .  predniSONE (DELTASONE) 10 MG tablet, Take 6 tabs PO on day 1&2, 5 tabs PO on day 3&4, 4 tabs PO on day 5&6, 3 tabs PO on day 7&8, 2 tabs PO on day 9&10, 1 tab PO on day 11&12. (Patient not taking: Reported on 02/28/2017), Disp: 42 tablet, Rfl: 0  Review of Systems  Constitutional: Negative for fever.  HENT: Positive for congestion. Negative for ear pain, postnasal drip, rhinorrhea, sinus pressure, sinus pain, sneezing and sore  throat.   Respiratory: Positive for cough, chest tightness, shortness of breath and wheezing.   Cardiovascular: Negative for chest pain, palpitations and leg swelling.  Gastrointestinal: Negative for abdominal pain, diarrhea, nausea and vomiting.  Neurological: Positive for headaches.    Social History   Tobacco Use  . Smoking status: Never Smoker  . Smokeless tobacco: Never Used  Substance Use Topics  . Alcohol use: Yes    Alcohol/week: 0.0 oz    Comment: 1/2 ca of beer every 2 weeks   Objective:   BP (!) 160/100 (BP Location: Left Wrist,  Patient Position: Sitting, Cuff Size: Normal)   Pulse 95   Temp 98.3 F (36.8 C) (Oral)   Resp 16   Wt 263 lb 6.4 oz (119.5 kg)   SpO2 94% Comment: 94%-93%  BMI 45.21 kg/m    Physical Exam  Constitutional: She appears well-developed and well-nourished. No distress.  HENT:  Head: Normocephalic and atraumatic.  Right Ear: Hearing, tympanic membrane, external ear and ear canal normal.  Left Ear: Hearing, tympanic membrane, external ear and ear canal normal.  Nose: Nose normal.  Mouth/Throat: Uvula is midline, oropharynx is clear and moist and mucous membranes are normal. No oropharyngeal exudate.  Eyes: Conjunctivae are normal. Pupils are equal, round, and reactive to light. Right eye exhibits no discharge. Left eye exhibits no discharge. No scleral icterus.  Neck: Normal range of motion. Neck supple. No tracheal deviation present. No thyromegaly present.  Cardiovascular: Normal rate, regular rhythm and normal heart sounds. Exam reveals no gallop and no friction rub.  No murmur heard. Pulmonary/Chest: Effort normal. No stridor. No respiratory distress. She has wheezes (expiratory wheezes heard throughout). She has no rales.  Lymphadenopathy:    She has no cervical adenopathy.  Skin: Skin is warm and dry. She is not diaphoretic.  Vitals reviewed.      Assessment & Plan:     1. Moderate persistent asthmatic bronchitis with acute exacerbation Worsening. Will treat with doxycycline and prednisone as below. Continue inhalers as prescribed. Call if symptoms fail to improve.  - doxycycline (VIBRA-TABS) 100 MG tablet; Take 1 tablet (100 mg total) by mouth 2 (two) times daily.  Dispense: 20 tablet; Refill: 0 - predniSONE (DELTASONE) 10 MG tablet; Take 6 tabs PO on day 1&2, 5 tabs PO on day 3&4, 4 tabs PO on day 5&6, 3 tabs PO on day 7&8, 2 tabs PO on day 9&10, 1 tab PO on day 11&12.  Dispense: 42 tablet; Refill: 0  2. Cough Worsening symptoms that has not responded to OTC medications. Will  give Hycodan cough syrup as below for nighttime cough. Drowsiness precautions given to patient. Stay well hydrated. Use delsym, robitussin OR mucinex for daytime cough. - HYDROcodone-homatropine (HYCODAN) 5-1.5 MG/5ML syrup; Take 5 mLs by mouth every 8 (eight) hours as needed for cough.  Dispense: 120 mL; Refill: 0       Margaretann LovelessJennifer M Keven Soucy, PA-C  [facility]

## 2017-03-11 ENCOUNTER — Encounter: Payer: Self-pay | Admitting: Physician Assistant

## 2017-03-11 ENCOUNTER — Ambulatory Visit (INDEPENDENT_AMBULATORY_CARE_PROVIDER_SITE_OTHER): Admitting: Physician Assistant

## 2017-03-11 VITALS — BP 152/88 | HR 100 | Temp 98.0°F | Resp 16 | Wt 262.0 lb

## 2017-03-11 DIAGNOSIS — M25511 Pain in right shoulder: Secondary | ICD-10-CM

## 2017-03-11 MED ORDER — CYCLOBENZAPRINE HCL 5 MG PO TABS: 5.0000 mg | ORAL_TABLET | Freq: Every day | ORAL | 0 refills | Status: DC

## 2017-03-11 NOTE — Progress Notes (Signed)
Patient: Joanna Bishop Female    DOB: 11-Jun-1955   62 y.o.   MRN: [id number] Visit Date: 03/11/2017  Today's Provider: Trey Sailors, PA-C   Chief Complaint  Patient presents with  . Shoulder Pain    Right shoulder; started about three days ago.    Subjective:    Joanna Bishop is a 62 y/o woman presenting today for right shoulder pain ongoing for a week or so. She feels like she may have injured a muscle in her right shoulder. No prior surgeries in this shoulder.  Shoulder Pain   The pain is present in the right shoulder. This is a new problem. The current episode started in the past 7 days. There has been no history of extremity trauma. The problem occurs constantly. The problem has been gradually worsening. Associated symptoms include joint swelling. Pertinent negatives include no fever, inability to bear weight, itching, joint locking, limited range of motion, numbness, stiffness or tingling. She has tried acetaminophen, NSAIDS and OTC ointments for the symptoms. The treatment provided no relief.       Allergies  Allergen Reactions  . Penicillins      Current Outpatient Medications:  .  albuterol (PROVENTIL HFA;VENTOLIN HFA) 108 (90 Base) MCG/ACT inhaler, Inhale 1-2 puffs into the lungs every 4 (four) hours as needed for wheezing or shortness of breath. Every 4-6 hours as needed, Disp: 1 Inhaler, Rfl: 11 .  ALPRAZolam (XANAX) 0.5 MG tablet, Take 1 tablet (0.5 mg total) by mouth 2 (two) times daily as needed for anxiety., Disp: 60 tablet, Rfl: 5 .  aspirin 81 MG tablet, Take by mouth daily., Disp: , Rfl:  .  benztropine (COGENTIN) 0.5 MG tablet, Take 1 tablet (0.5 mg total) by mouth 2 (two) times daily., Disp: 180 tablet, Rfl: 3 .  butalbital-acetaminophen-caffeine (FIORICET WITH CODEINE) 50-325-40-30 MG capsule, Take 1 capsule by mouth every 4 (four) hours as needed for headache., Disp: 30 capsule, Rfl: 0 .  Calcium-Magnesium-Vitamin D (CALCIUM 500 PO), Take 1 tablet by  mouth 2 (two) times daily., Disp: , Rfl:  .  divalproex (DEPAKOTE ER) 500 MG 24 hr tablet, Take 2 tablets (1,000 mg total) by mouth at bedtime., Disp: 180 tablet, Rfl: 3 .  doxycycline (VIBRA-TABS) 100 MG tablet, Take 1 tablet (100 mg total) by mouth 2 (two) times daily., Disp: 20 tablet, Rfl: 0 .  glipiZIDE (GLUCOTROL XL) 10 MG 24 hr tablet, TAKE 1 TABLET (10 MG TOTAL) BY MOUTH DAILY WITH BREAKFAST., Disp: 90 tablet, Rfl: 1 .  HYDROcodone-homatropine (HYCODAN) 5-1.5 MG/5ML syrup, Take 5 mLs by mouth every 8 (eight) hours as needed for cough., Disp: 120 mL, Rfl: 0 .  metFORMIN (GLUCOPHAGE) 500 MG tablet, TAKE 1 TABLET (500 MG TOTAL) BY MOUTH 2 (TWO) TIMES DAILY WITH A MEAL., Disp: 180 tablet, Rfl: 1 .  montelukast (SINGULAIR) 10 MG tablet, Take 1 tablet (10 mg total) by mouth daily., Disp: 90 tablet, Rfl: 3 .  predniSONE (DELTASONE) 10 MG tablet, Take 6 tabs PO on day 1&2, 5 tabs PO on day 3&4, 4 tabs PO on day 5&6, 3 tabs PO on day 7&8, 2 tabs PO on day 9&10, 1 tab PO on day 11&12., Disp: 42 tablet, Rfl: 0 .  umeclidinium bromide (INCRUSE ELLIPTA) 62.5 MCG/INH AEPB, Inhale 1 puff into the lungs daily., Disp: 30 each, Rfl: 11 .  fluticasone (FLONASE) 50 MCG/ACT nasal spray, Place 2 sprays into the nose daily., Disp: , Rfl:  Review of Systems  Constitutional: Negative.  Negative for fever.  Musculoskeletal: Positive for arthralgias and joint swelling. Negative for back pain, gait problem, myalgias, neck pain, neck stiffness and stiffness.  Skin: Negative for itching.  Neurological: Negative for tingling and numbness.    Social History   Tobacco Use  . Smoking status: Never Smoker  . Smokeless tobacco: Never Used  Substance Use Topics  . Alcohol use: Yes    Alcohol/week: 0.0 oz    Comment: 1/2 ca of beer every 2 weeks   Objective:   BP (!) 152/88 (BP Location: Right Arm, Patient Position: Sitting, Cuff Size: Large)   Pulse 100   Temp 98 F (36.7 C) (Oral)   Resp 16   Wt 262 lb (118.8  kg)   BMI 44.97 kg/m  Vitals:   03/11/17 1625  BP: (!) 152/88  Pulse: 100  Resp: 16  Temp: 98 F (36.7 C)  TempSrc: Oral  Weight: 262 lb (118.8 kg)     Physical Exam  Constitutional: She is oriented to person, place, and time.  Musculoskeletal: She exhibits tenderness. She exhibits no edema or deformity.       Right shoulder: She exhibits decreased range of motion, tenderness and pain. She exhibits no bony tenderness, no swelling, no effusion, no crepitus, no deformity, no laceration, no spasm, normal pulse and normal strength.       Left shoulder: Normal.  Pain in right shoulder pas 90 degrees of abduction. Tenderness to palpation along right acromion. No tenderness, step off, deformity to right clavicle. Some pain with Hawkins test on right side.   Neurological: She is alert and oriented to person, place, and time.  Skin: Skin is warm and dry.  Psychiatric: She has a normal mood and affect. Her behavior is normal.        Assessment & Plan:     1. Acute pain of right shoulder  Impingement syndrome. Some muscular tenderness on exam, suspect some bursitis as well. Advised patient to continue with NSAIDs, may take muscle relaxer to see if it provides relief. If not improving in 1-2 weeks, consider physical therapy, possible steroid injection.   - cyclobenzaprine (FLEXERIL) 5 MG tablet; Take 1 tablet (5 mg total) by mouth at bedtime.  Dispense: 30 tablet; Refill: 0  Return if symptoms worsen or fail to improve.  The entirety of the information documented in the History of Present Illness, Review of Systems and Physical Exam were personally obtained by me. Portions of this information were initially documented by Kavin LeechLaura Walsh, CMA and reviewed by me for thoroughness and accuracy.         Trey SailorsAdriana M Dorinda Stehr, PA-C  [facility]

## 2017-03-11 NOTE — Patient Instructions (Signed)
Shoulder Pain Many things can cause shoulder pain, including:  An injury.  Moving the arm in the same way again and again (overuse).  Joint pain (arthritis).  Follow these instructions at home: Take these actions to help with your pain:  Squeeze a soft ball or a foam pad as much as you can. This helps to prevent swelling. It also makes the arm stronger.  Take over-the-counter and prescription medicines only as told by your doctor.  If told, put ice on the area: ? Put ice in a plastic bag. ? Place a towel between your skin and the bag. ? Leave the ice on for 20 minutes, 2-3 times per day. Stop putting on ice if it does not help with the pain.  If you were given a shoulder sling or immobilizer: ? Wear it as told. ? Remove it to shower or bathe. ? Move your arm as little as possible. ? Keep your hand moving. This helps prevent swelling.  Contact a doctor if:  Your pain gets worse.  Medicine does not help your pain.  You have new pain in your arm, hand, or fingers. Get help right away if:  Your arm, hand, or fingers: ? Tingle. ? Are numb. ? Are swollen. ? Are painful. ? Turn white or blue. This information is not intended to replace advice given to you by your health care provider. Make sure you discuss any questions you have with your health care provider. Document Released: 08/01/2007 Document Revised: 10/09/2015 Document Reviewed: 06/07/2014 Elsevier Interactive Patient Education  2018 [employer] Inc[employer]  

## 2017-03-15 ENCOUNTER — Ambulatory Visit (INDEPENDENT_AMBULATORY_CARE_PROVIDER_SITE_OTHER): Admitting: Family Medicine

## 2017-03-15 VITALS — BP 168/92 | HR 108 | Temp 98.1°F | Resp 18 | Wt 258.0 lb

## 2017-03-15 DIAGNOSIS — M7541 Impingement syndrome of right shoulder: Secondary | ICD-10-CM | POA: Diagnosis not present

## 2017-03-15 MED ORDER — LIDOCAINE HCL (PF) 1 % IJ SOLN
1.0000 mL | Freq: Once | INTRAMUSCULAR | Status: AC
  Administered 2017-03-15: 1 mL via INTRADERMAL

## 2017-03-15 MED ORDER — LIDOCAINE HCL (PF) 1 % IJ SOLN
2.0000 mL | Freq: Once | INTRAMUSCULAR | Status: AC
  Administered 2017-03-15: 2 mL via INTRADERMAL

## 2017-03-15 MED ORDER — METHYLPREDNISOLONE ACETATE 80 MG/ML IJ SUSP
40.0000 mg | Freq: Once | INTRAMUSCULAR | Status: AC
  Administered 2017-03-15: 40 mg via INTRAMUSCULAR

## 2017-03-15 NOTE — Progress Notes (Signed)
Patient: Joanna Bishop Female    DOB: Dec 01, 1955   62 y.o.   MRN: [id number] Visit Date: 03/15/2017  Today's Provider: Shirlee Latch, MD   Chief Complaint  Patient presents with  . Shoulder Pain   Subjective:    HPI Pt reports that she is back today for a shoulder injection she was seen on Monday for right shoulder pain. She is still having the pain. She was given Flexeril and told to come back for a shoulder injection if this not help.  She has pain with reaching overhead.  This is affecting sleep quality.  States she is nervous about having an injection.     Allergies  Allergen Reactions  . Penicillins      Current Outpatient Medications:  .  albuterol (PROVENTIL HFA;VENTOLIN HFA) 108 (90 Base) MCG/ACT inhaler, Inhale 1-2 puffs into the lungs every 4 (four) hours as needed for wheezing or shortness of breath. Every 4-6 hours as needed, Disp: 1 Inhaler, Rfl: 11 .  aspirin 81 MG tablet, Take by mouth daily., Disp: , Rfl:  .  benztropine (COGENTIN) 0.5 MG tablet, Take 1 tablet (0.5 mg total) by mouth 2 (two) times daily., Disp: 180 tablet, Rfl: 3 .  Calcium-Magnesium-Vitamin D (CALCIUM 500 PO), Take 1 tablet by mouth 2 (two) times daily., Disp: , Rfl:  .  cyclobenzaprine (FLEXERIL) 5 MG tablet, Take 1 tablet (5 mg total) by mouth at bedtime., Disp: 30 tablet, Rfl: 0 .  divalproex (DEPAKOTE ER) 500 MG 24 hr tablet, Take 2 tablets (1,000 mg total) by mouth at bedtime., Disp: 180 tablet, Rfl: 3 .  glipiZIDE (GLUCOTROL XL) 10 MG 24 hr tablet, TAKE 1 TABLET (10 MG TOTAL) BY MOUTH DAILY WITH BREAKFAST., Disp: 90 tablet, Rfl: 1 .  metFORMIN (GLUCOPHAGE) 500 MG tablet, TAKE 1 TABLET (500 MG TOTAL) BY MOUTH 2 (TWO) TIMES DAILY WITH A MEAL., Disp: 180 tablet, Rfl: 1 .  montelukast (SINGULAIR) 10 MG tablet, Take 1 tablet (10 mg total) by mouth daily., Disp: 90 tablet, Rfl: 3 .  umeclidinium bromide (INCRUSE ELLIPTA) 62.5 MCG/INH AEPB, Inhale 1 puff into the lungs daily., Disp: 30  each, Rfl: 11 .  ALPRAZolam (XANAX) 0.5 MG tablet, Take 1 tablet (0.5 mg total) by mouth 2 (two) times daily as needed for anxiety. (Patient not taking: Reported on 03/15/2017), Disp: 60 tablet, Rfl: 5 .  butalbital-acetaminophen-caffeine (FIORICET WITH CODEINE) 50-325-40-30 MG capsule, Take 1 capsule by mouth every 4 (four) hours as needed for headache. (Patient not taking: Reported on 03/15/2017), Disp: 30 capsule, Rfl: 0 .  doxycycline (VIBRA-TABS) 100 MG tablet, Take 1 tablet (100 mg total) by mouth 2 (two) times daily. (Patient not taking: Reported on 03/15/2017), Disp: 20 tablet, Rfl: 0 .  fluticasone (FLONASE) 50 MCG/ACT nasal spray, Place 2 sprays into the nose daily., Disp: , Rfl:  .  HYDROcodone-homatropine (HYCODAN) 5-1.5 MG/5ML syrup, Take 5 mLs by mouth every 8 (eight) hours as needed for cough. (Patient not taking: Reported on 03/15/2017), Disp: 120 mL, Rfl: 0 .  predniSONE (DELTASONE) 10 MG tablet, Take 6 tabs PO on day 1&2, 5 tabs PO on day 3&4, 4 tabs PO on day 5&6, 3 tabs PO on day 7&8, 2 tabs PO on day 9&10, 1 tab PO on day 11&12. (Patient not taking: Reported on 03/15/2017), Disp: 42 tablet, Rfl: 0  Review of Systems  Constitutional: Negative.   HENT: Negative.   Eyes: Negative.   Respiratory: Negative.   Cardiovascular:  Negative.   Gastrointestinal: Negative.   Endocrine: Negative.   Genitourinary: Negative.   Musculoskeletal: Positive for arthralgias.  Skin: Negative.   Allergic/Immunologic: Negative.   Neurological: Negative.   Hematological: Negative.   Psychiatric/Behavioral: Negative.     Social History   Tobacco Use  . Smoking status: Never Smoker  . Smokeless tobacco: Never Used  Substance Use Topics  . Alcohol use: Yes    Alcohol/week: 0.0 oz    Comment: 1/2 ca of beer every 2 weeks   Objective:   BP (!) 168/92 (BP Location: Left Arm, Patient Position: Sitting, Cuff Size: Large)   Pulse (!) 108   Temp 98.1 F (36.7 C) (Oral)   Resp 18   Wt 258 lb (117  kg)   BMI 44.29 kg/m  Vitals:   03/15/17 1412  BP: (!) 168/92  Pulse: (!) 108  Resp: 18  Temp: 98.1 F (36.7 C)  TempSrc: Oral  Weight: 258 lb (117 kg)     Physical Exam  Constitutional: She is oriented to person, place, and time. She appears well-developed and well-nourished. No distress.  HENT:  Head: Normocephalic and atraumatic.  Eyes: Conjunctivae are normal. No scleral icterus.  Cardiovascular: Regular rhythm, normal heart sounds and intact distal pulses. Tachycardia present.  No murmur heard. Pulmonary/Chest: Effort normal and breath sounds normal. No respiratory distress. She has no wheezes. She has no rales.  Musculoskeletal: She exhibits no edema or deformity.  R Shoulder: Inspection reveals no abnormalities, atrophy or asymmetry. + TTP over subacromial space Pain with ROM above 90 degrees of abduction Rotator cuff strength normal throughout. + Hawkins. Speeds and Yergason's tests normal. Normal scapular function observed. No painful arc and no drop arm sign. No apprehension sign  Neurological: She is alert and oriented to person, place, and time.  Skin: Skin is warm and dry. No rash noted.  Psychiatric: She has a normal mood and affect. Her behavior is normal.  Vitals reviewed.   Assessment & Plan:      1. 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 Appropriate time out was taken. Area prepped and draped in usual sterile fashion. 0.5 cc of methylprednisolone 80 mg/ml plus  3 cc of 1% lidocaine without epinephrine was injected into the R shoulder using a(n) posterior,  subacromial approach. The patient tolerated the procedure well. There were no complications. Post procedure instructions were given.   Return if symptoms worsen or fail to improve.   The entirety of the information documented in the History of Present Illness, Review of Systems and Physical Exam were personally obtained by me. Portions of this information were initially documented by EritreaBrittany O'Dell, CMA and reviewed by me for thoroughness and accuracy.    Erasmo DownerBacigalupo, Haliegh Khurana M, MD, MPH [facility] 03/15/2017 4:08 PM

## 2017-03-15 NOTE — Patient Instructions (Signed)
See exercises given separately

## 2017-03-21 ENCOUNTER — Telehealth: Payer: Self-pay | Admitting: Physician Assistant

## 2017-03-21 DIAGNOSIS — J40 Bronchitis, not specified as acute or chronic: Secondary | ICD-10-CM

## 2017-03-21 MED ORDER — PREDNISONE 10 MG (21) PO TBPK: ORAL_TABLET | ORAL | 0 refills | Status: DC

## 2017-03-21 MED ORDER — AZITHROMYCIN 250 MG PO TABS: ORAL_TABLET | ORAL | 0 refills | Status: DC

## 2017-03-21 NOTE — Telephone Encounter (Signed)
Patient is calling stating she has bronchitis again.   That this is a normal sickness for her and Antony ContrasJenni [place] [facility] [place]

## 2017-03-21 NOTE — Telephone Encounter (Signed)
meds sent in to [facility] [place]

## 2017-04-06 ENCOUNTER — Other Ambulatory Visit: Payer: Self-pay | Admitting: Physician Assistant

## 2017-04-06 DIAGNOSIS — M25511 Pain in right shoulder: Secondary | ICD-10-CM

## 2017-04-30 ENCOUNTER — Encounter: Payer: Self-pay | Admitting: Physician Assistant

## 2017-05-15 ENCOUNTER — Encounter: Payer: Self-pay | Admitting: Physician Assistant

## 2017-06-15 ENCOUNTER — Other Ambulatory Visit: Payer: Self-pay | Admitting: Physician Assistant

## 2017-06-15 DIAGNOSIS — E119 Type 2 diabetes mellitus without complications: Secondary | ICD-10-CM

## 2017-08-01 ENCOUNTER — Other Ambulatory Visit: Payer: Self-pay | Admitting: Physician Assistant

## 2017-08-01 DIAGNOSIS — E119 Type 2 diabetes mellitus without complications: Secondary | ICD-10-CM

## 2017-08-15 ENCOUNTER — Other Ambulatory Visit: Payer: Self-pay | Admitting: Physician Assistant

## 2017-08-15 DIAGNOSIS — F319 Bipolar disorder, unspecified: Secondary | ICD-10-CM

## 2017-08-15 DIAGNOSIS — J301 Allergic rhinitis due to pollen: Secondary | ICD-10-CM

## 2017-09-12 ENCOUNTER — Other Ambulatory Visit: Payer: Self-pay | Admitting: Physician Assistant

## 2017-09-12 DIAGNOSIS — F319 Bipolar disorder, unspecified: Secondary | ICD-10-CM

## 2017-11-01 ENCOUNTER — Encounter: Payer: Self-pay | Admitting: Physician Assistant

## 2017-11-21 ENCOUNTER — Encounter: Payer: Self-pay | Admitting: Physician Assistant

## 2017-11-21 NOTE — Progress Notes (Deleted)
Patient: Joanna Bishop, Female    DOB: 1955/07/23, 62 y.o.   MRN: [id number] Visit Date: 11/21/2017  Today's Provider: Margaretann Loveless, PA-C   No chief complaint on file.  Subjective:    Annual physical exam Joanna Bishop is a 62 y.o. female who presents today for health maintenance and complete physical. She feels {DESC; WELL/FAIRLY WELL/POORLY:18703}. She reports exercising ***. She reports she is sleeping {DESC; WELL/FAIRLY WELL/POORLY:18703}.  -----------------------------------------------------------------   Review of Systems  Constitutional: Negative.   HENT: Negative.   Eyes: Negative.   Respiratory: Negative.   Cardiovascular: Negative.   Gastrointestinal: Negative.   Endocrine: Negative.   Genitourinary: Negative.   Musculoskeletal: Negative.   Skin: Negative.   Allergic/Immunologic: Negative.   Neurological: Negative.   Hematological: Negative.   Psychiatric/Behavioral: Negative.     Social History      She  reports that she has never smoked. She has never used smokeless tobacco. She reports that she drinks alcohol.       Social History   Socioeconomic History  . Marital status: Married    Spouse name: Not on file  . Number of children: Not on file  . Years of education: Not on file  . Highest education level: Not on file  Occupational History  . Not on file  Social Needs  . Financial resource strain: Not on file  . Food insecurity:    Worry: Not on file    Inability: Not on file  . Transportation needs:    Medical: Not on file    Non-medical: Not on file  Tobacco Use  . Smoking status: Never Smoker  . Smokeless tobacco: Never Used  Substance and Sexual Activity  . Alcohol use: Yes    Alcohol/week: 0.0 standard drinks    Comment: 1/2 ca of beer every 2 weeks  . Drug use: Not on file  . Sexual activity: Not Currently  Lifestyle  . Physical activity:    Days per week: Not on file    Minutes per session: Not on file  . Stress:  Not on file  Relationships  . Social connections:    Talks on phone: Not on file    Gets together: Not on file    Attends religious service: Not on file    Active member of club or organization: Not on file    Attends meetings of clubs or organizations: Not on file    Relationship status: Not on file  Other Topics Concern  . Not on file  Social History Narrative  . Not on file    Past Medical History:  Diagnosis Date  . Anxiety   . Asthma   . Depression   . Diabetes mellitus without complication [facility])      Patient Active Problem List   Diagnosis Date Noted  . Type 2 diabetes mellitus without complication, without long-term current use of insulin (HCC) 10/17/2015  . Carpal tunnel syndrome of left wrist 10/14/2015  . Bipolar depression (HCC) 07/12/2015  . 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 AB (asthmatic bronchitis) 08/02/2014  . Hypercholesteremia 08/02/2014  . History of suicidal ideation 08/02/2014  . Avitaminosis D 08/02/2014  . Generalized hyperhidrosis 08/02/2014  . Airway hyperreactivity 08/02/2014    Past Surgical History:  Procedure Laterality Date  . ABDOMINAL HYSTERECTOMY    . ARTHROPLASTY    . CHOLECYSTECTOMY    . FEMUR FRACTURE SURGERY    . TOTAL KNEE ARTHROPLASTY Left  Family History        Family Status  Relation Name Status  . Mother  Deceased  . Father  Deceased  . MGM  Deceased  . PGM  Deceased        Her family history includes COPD in her mother; Coronary artery disease in her father and maternal grandmother; Diabetes in her father, mother, and paternal grandmother; Heart attack in her maternal grandmother.      Allergies  Allergen Reactions  . Penicillins      Current Outpatient Medications:  .  albuterol (PROVENTIL HFA;VENTOLIN HFA) 108 (90 Base) MCG/ACT inhaler, Inhale 1-2 puffs into the lungs every 4 (four) hours as needed for wheezing or shortness of breath. Every 4-6 hours as needed, Disp: 1 Inhaler, Rfl: 11 .   ALPRAZolam (XANAX) 0.5 MG tablet, Take 1 tablet (0.5 mg total) by mouth 2 (two) times daily as needed for anxiety. (Patient not taking: Reported on 03/15/2017), Disp: 60 tablet, Rfl: 5 .  aspirin 81 MG tablet, Take by mouth daily., Disp: , Rfl:  .  azithromycin (ZITHROMAX) 250 MG tablet, Take 2 tablets PO on day one, and one tablet PO daily thereafter until completed., Disp: 6 tablet, Rfl: 0 .  benztropine (COGENTIN) 0.5 MG tablet, TAKE 1 TABLET (0.5 MG TOTAL) BY MOUTH 2 (TWO) TIMES DAILY., Disp: 180 tablet, Rfl: 3 .  butalbital-acetaminophen-caffeine (FIORICET WITH CODEINE) 50-325-40-30 MG capsule, Take 1 capsule by mouth every 4 (four) hours as needed for headache. (Patient not taking: Reported on 03/15/2017), Disp: 30 capsule, Rfl: 0 .  Calcium-Magnesium-Vitamin D (CALCIUM 500 PO), Take 1 tablet by mouth 2 (two) times daily., Disp: , Rfl:  .  cyclobenzaprine (FLEXERIL) 5 MG tablet, Take 1 tablet (5 mg total) by mouth at bedtime., Disp: 30 tablet, Rfl: 0 .  divalproex (DEPAKOTE ER) 500 MG 24 hr tablet, TAKE 2 TABLETS (1,000 MG TOTAL) BY MOUTH AT BEDTIME., Disp: 180 tablet, Rfl: 3 .  fluticasone (FLONASE) 50 MCG/ACT nasal spray, Place 2 sprays into the nose daily., Disp: , Rfl:  .  glipiZIDE (GLUCOTROL XL) 10 MG 24 hr tablet, TAKE 1 TABLET (10 MG TOTAL) BY MOUTH DAILY WITH BREAKFAST., Disp: 90 tablet, Rfl: 1 .  metFORMIN (GLUCOPHAGE) 500 MG tablet, TAKE 1 TABLET (500 MG TOTAL) BY MOUTH 2 (TWO) TIMES DAILY WITH A MEAL., Disp: 180 tablet, Rfl: 1 .  montelukast (SINGULAIR) 10 MG tablet, TAKE 1 TABLET BY MOUTH EVERY DAY, Disp: 90 tablet, Rfl: 3 .  predniSONE (STERAPRED UNI-PAK 21 TAB) 10 MG (21) TBPK tablet, 6 day taper; take as directed on package instructions, Disp: 21 tablet, Rfl: 0 .  umeclidinium bromide (INCRUSE ELLIPTA) 62.5 MCG/INH AEPB, Inhale 1 puff into the lungs daily., Disp: 30 each, Rfl: 11   Patient Care Team: Reine Just as PCP - General (Physician Assistant)       Objective:   Vitals: There were no vitals taken for this visit.  There were no vitals filed for this visit.   Physical Exam  Constitutional: She is oriented to person, place, and time. She appears well-developed and well-nourished.  HENT:  Head: Normocephalic.  Right Ear: External ear normal.  Left Ear: External ear normal.  Nose: Nose normal.  Mouth/Throat: Oropharynx is clear and moist.  Eyes: Pupils are equal, round, and reactive to light. Conjunctivae and EOM are normal.  Neck: Normal range of motion. Neck supple.  Cardiovascular: Normal rate, regular rhythm, normal heart sounds and intact distal pulses.  Pulmonary/Chest: Effort normal  and breath sounds normal.  Abdominal: Soft. Bowel sounds are normal.  Musculoskeletal: Normal range of motion.  Neurological: She is alert and oriented to person, place, and time.  Skin: Skin is warm.  Psychiatric: She has a normal mood and affect. Her behavior is normal. Judgment and thought content normal.     Depression Screen PHQ 2/9 Scores 02/28/2017 12/09/2014  PHQ - 2 Score 2 6  PHQ- 9 Score 7 24      Assessment & Plan:     Routine Health Maintenance and Physical Exam  Exercise Activities and Dietary recommendations Goals   None     Immunization History  Administered Date(s) Administered  . Pneumococcal Polysaccharide-23 07/30/2012  . Tdap 07/28/2009    Health Maintenance  Topic Date Due  . FOOT EXAM  05/04/1967  . OPHTHALMOLOGY EXAM  05/04/1967  . URINE MICROALBUMIN  05/04/1967  . HIV Screening  05/03/1972  . MAMMOGRAM  05/04/2007  . HEMOGLOBIN A1C  02/12/2017  . INFLUENZA VACCINE  09/26/2017  . PAP SMEAR  10/14/2018  . TETANUS/TDAP  07/29/2019  . COLONOSCOPY  10/18/2026  . PNEUMOCOCCAL POLYSACCHARIDE VACCINE AGE 20-64 HIGH RISK  Completed  . Hepatitis C Screening  Completed     Discussed health benefits of physical activity, and encouraged her to engage in regular exercise appropriate for her age and  condition.    --------------------------------------------------------------------    Margaretann Loveless, PA-C  [facility]

## 2017-12-20 ENCOUNTER — Ambulatory Visit (INDEPENDENT_AMBULATORY_CARE_PROVIDER_SITE_OTHER): Admitting: Physician Assistant

## 2017-12-20 ENCOUNTER — Encounter: Payer: Self-pay | Admitting: Physician Assistant

## 2017-12-20 VITALS — BP 150/100 | HR 93 | Temp 98.0°F | Resp 20 | Wt 248.0 lb

## 2017-12-20 DIAGNOSIS — J4520 Mild intermittent asthma, uncomplicated: Secondary | ICD-10-CM

## 2017-12-20 DIAGNOSIS — E119 Type 2 diabetes mellitus without complications: Secondary | ICD-10-CM | POA: Diagnosis not present

## 2017-12-20 MED ORDER — PREDNISONE 20 MG PO TABS: 20.0000 mg | ORAL_TABLET | Freq: Every day | ORAL | 0 refills | Status: AC

## 2017-12-20 NOTE — Patient Instructions (Addendum)

## 2017-12-20 NOTE — Progress Notes (Signed)
Patient: Joanna Bishop Female    DOB: 11/25/1955   62 y.o.   MRN: [id number] Visit Date: 12/20/2017  Today's Provider: Trey Sailors, PA-C   Chief Complaint  Patient presents with  . URI   Subjective:    HPI Upper Respiratory Infection: Patient complains of symptoms of a URI, possible sinusitis. Symptoms include congestion and cough. Onset of symptoms was 5 days ago, gradually worsening since that time. She also c/o congestion, productive cough with  green colored sputum, shortness of breath and wheezing for the past 2 days .  She is drinking plenty of fluids. Evaluation to date: none. Treatment to date: cough suppressants, decongestants and inhalers. Albuterol inhaler used at 7 am., having to use more frequently.  Diabetes Mellitus II: Of note, patient's last A1c in 11/2016 was 7.9 She has not been seen within the past year for diabetes. She said at one point she checked her sugar and it was 400, unsure if this was fasting. Another time she checked it and it was 180.      Allergies  Allergen Reactions  . Penicillins      Current Outpatient Medications:  .  albuterol (PROVENTIL HFA;VENTOLIN HFA) 108 (90 Base) MCG/ACT inhaler, Inhale 1-2 puffs into the lungs every 4 (four) hours as needed for wheezing or shortness of breath. Every 4-6 hours as needed, Disp: 1 Inhaler, Rfl: 11 .  aspirin 81 MG tablet, Take by mouth daily., Disp: , Rfl:  .  benztropine (COGENTIN) 0.5 MG tablet, TAKE 1 TABLET (0.5 MG TOTAL) BY MOUTH 2 (TWO) TIMES DAILY., Disp: 180 tablet, Rfl: 3 .  Calcium-Magnesium-Vitamin D (CALCIUM 500 PO), Take 1 tablet by mouth 2 (two) times daily., Disp: , Rfl:  .  cyclobenzaprine (FLEXERIL) 5 MG tablet, Take 1 tablet (5 mg total) by mouth at bedtime., Disp: 30 tablet, Rfl: 0 .  divalproex (DEPAKOTE ER) 500 MG 24 hr tablet, TAKE 2 TABLETS (1,000 MG TOTAL) BY MOUTH AT BEDTIME., Disp: 180 tablet, Rfl: 3 .  glipiZIDE (GLUCOTROL XL) 10 MG 24 hr tablet, TAKE 1 TABLET (10 MG  TOTAL) BY MOUTH DAILY WITH BREAKFAST., Disp: 90 tablet, Rfl: 1 .  metFORMIN (GLUCOPHAGE) 500 MG tablet, TAKE 1 TABLET (500 MG TOTAL) BY MOUTH 2 (TWO) TIMES DAILY WITH A MEAL., Disp: 180 tablet, Rfl: 1 .  montelukast (SINGULAIR) 10 MG tablet, TAKE 1 TABLET BY MOUTH EVERY DAY, Disp: 90 tablet, Rfl: 3 .  umeclidinium bromide (INCRUSE ELLIPTA) 62.5 MCG/INH AEPB, Inhale 1 puff into the lungs daily., Disp: 30 each, Rfl: 11 .  fluticasone (FLONASE) 50 MCG/ACT nasal spray, Place 2 sprays into the nose daily., Disp: , Rfl:   Review of Systems  HENT: Positive for congestion.   Respiratory: Positive for cough, shortness of breath and wheezing.     Social History   Tobacco Use  . Smoking status: Never Smoker  . Smokeless tobacco: Never Used  Substance Use Topics  . Alcohol use: Yes    Alcohol/week: 0.0 standard drinks    Comment: 1/2 ca of beer every 2 weeks   Objective:   BP (!) 150/100 (BP Location: Left Arm, Patient Position: Sitting, Cuff Size: Large)   Pulse 93   Temp 98 F (36.7 C) (Oral)   Resp 20   Wt 248 lb (112.5 kg)   SpO2 95%   BMI 42.57 kg/m  Vitals:   12/20/17 0840  BP: (!) 150/100  Pulse: 93  Resp: 20  Temp: 98 F (  36.7 C)  TempSrc: Oral  SpO2: 95%  Weight: 248 lb (112.5 kg)     Physical Exam  Constitutional: She is oriented to person, place, and time. She appears well-developed and well-nourished.  Cardiovascular: Normal rate and regular rhythm.  Pulmonary/Chest: Effort normal. She has wheezes.  Rhonchi that clears with coughing.   Neurological: She is alert and oriented to person, place, and time.  Skin: Skin is warm and dry.  Psychiatric: She has a normal mood and affect. Her behavior is normal.        Assessment & Plan:     1. Mild intermittent asthmatic bronchitis without complication  Counseled regarding signs and symptoms of viral and bacterial respiratory infections. Advised to call or return for additional evaluation if she develops any sign of  bacterial infection, or if current symptoms last longer than 10 days.   Please take albuterol inhaler scheduled every 4-6 hours for the next 3 days.  - predniSONE (DELTASONE) 20 MG tablet; Take 1 tablet (20 mg total) by mouth daily with breakfast for 5 days.  Dispense: 5 tablet; Refill: 0  2. Type 2 diabetes mellitus without complication, without long-term current use of insulin (HCC)  Worsening and uncontrolled. Labs today, needs to come in for diabetes follow up, history of cancelling frequently. Continue medications as prescribed, adjust pending labwork.   - HgB A1c - CBC with Differential - Comprehensive Metabolic Panel (CMET) - Lipid Profile - Urine Microalbumin w/creat. ratio  Return in about 4 weeks (around 01/17/2018) for diabetes .  The entirety of the information documented in the History of Present Illness, Review of Systems and Physical Exam were personally obtained by me. Portions of this information were initially documented by Rondel Baton, CMA and reviewed by me for thoroughness and accuracy.         Trey Sailors, PA-C  [facility]

## 2017-12-21 LAB — COMPREHENSIVE METABOLIC PANEL
ALT: 39 IU/L — ABNORMAL HIGH (ref 0–32)
AST: 34 IU/L (ref 0–40)
Albumin/Globulin Ratio: 2.1 (ref 1.2–2.2)
Albumin: 4.7 g/dL (ref 3.6–4.8)
Alkaline Phosphatase: 79 IU/L (ref 39–117)
BUN/Creatinine Ratio: 22 (ref 12–28)
BUN: 21 mg/dL (ref 8–27)
Bilirubin Total: 0.5 mg/dL (ref 0.0–1.2)
CO2: 22 mmol/L (ref 20–29)
Calcium: 9.6 mg/dL (ref 8.7–10.3)
Chloride: 102 mmol/L (ref 96–106)
Creatinine, Ser: 0.96 mg/dL (ref 0.57–1.00)
GFR calc Af Amer: 74 mL/min/{1.73_m2} (ref >59)
GFR calc non Af Amer: 64 mL/min/{1.73_m2} (ref >59)
Globulin, Total: 2.2 g/dL (ref 1.5–4.5)
Glucose: 106 mg/dL — ABNORMAL HIGH (ref 65–99)
Potassium: 4.2 mmol/L (ref 3.5–5.2)
Sodium: 143 mmol/L (ref 134–144)
Total Protein: 6.9 g/dL (ref 6.0–8.5)

## 2017-12-21 LAB — CBC WITH DIFFERENTIAL/PLATELET
Basophils Absolute: 0.1 10*3/uL (ref 0.0–0.2)
Basos: 1 %
EOS (ABSOLUTE): 0.7 10*3/uL — ABNORMAL HIGH (ref 0.0–0.4)
Eos: 8 %
Hematocrit: 42.6 % (ref 34.0–46.6)
Hemoglobin: 14.5 g/dL (ref 11.1–15.9)
Immature Grans (Abs): 0.1 10*3/uL (ref 0.0–0.1)
Immature Granulocytes: 1 %
Lymphocytes Absolute: 3.7 10*3/uL — ABNORMAL HIGH (ref 0.7–3.1)
Lymphs: 39 %
MCH: 30.3 pg (ref 26.6–33.0)
MCHC: 34 g/dL (ref 31.5–35.7)
MCV: 89 fL (ref 79–97)
Monocytes Absolute: 0.8 10*3/uL (ref 0.1–0.9)
Monocytes: 8 %
Neutrophils Absolute: 4.1 10*3/uL (ref 1.4–7.0)
Neutrophils: 43 %
Platelets: 247 10*3/uL (ref 150–450)
RBC: 4.78 x10E6/uL (ref 3.77–5.28)
RDW: 12.9 % (ref 12.3–15.4)
WBC: 9.5 10*3/uL (ref 3.4–10.8)

## 2017-12-21 LAB — LIPID PANEL
Chol/HDL Ratio: 4.8 ratio — ABNORMAL HIGH (ref 0.0–4.4)
Cholesterol, Total: 168 mg/dL (ref 100–199)
HDL: 35 mg/dL — ABNORMAL LOW (ref >39)
LDL Calculated: 71 mg/dL (ref 0–99)
Triglycerides: 310 mg/dL — ABNORMAL HIGH (ref 0–149)
VLDL Cholesterol Cal: 62 mg/dL — ABNORMAL HIGH (ref 5–40)

## 2017-12-21 LAB — MICROALBUMIN / CREATININE URINE RATIO
Creatinine, Urine: 134.2 mg/dL
Microalb/Creat Ratio: 108.9 mg/g creat — ABNORMAL HIGH (ref 0.0–30.0)
Microalbumin, Urine: 146.2 ug/mL

## 2017-12-21 LAB — HEMOGLOBIN A1C
Est. average glucose Bld gHb Est-mCnc: 137 mg/dL
Hgb A1c MFr Bld: 6.4 % — ABNORMAL HIGH (ref 4.8–5.6)

## 2017-12-25 ENCOUNTER — Telehealth: Payer: Self-pay

## 2017-12-25 NOTE — Telephone Encounter (Signed)
[id number] 

## 2017-12-25 NOTE — Telephone Encounter (Signed)
Pt returned call. Thanks TNP °

## 2017-12-25 NOTE — Telephone Encounter (Signed)
-----   Message from Trey Sailors, [place] sent at 12/24/2017 10:40 AM EDT ----- A1c has improved from last year, it is 6.4%. CBC stable. CMET stable. Triglycerides high but otherwise cholesterol stable. There is protein in her urine and she should follow up with Antony Contras to discuss a medication that protects her kidneys.

## 2017-12-30 NOTE — Telephone Encounter (Signed)
Patient advised as directed below. 

## 2018-01-06 ENCOUNTER — Other Ambulatory Visit: Payer: Self-pay | Admitting: Physician Assistant

## 2018-01-06 ENCOUNTER — Ambulatory Visit (INDEPENDENT_AMBULATORY_CARE_PROVIDER_SITE_OTHER): Admitting: Physician Assistant

## 2018-01-06 ENCOUNTER — Encounter: Payer: Self-pay | Admitting: Physician Assistant

## 2018-01-06 VITALS — BP 160/100 | HR 70 | Temp 98.3°F | Resp 16 | Wt 259.0 lb

## 2018-01-06 DIAGNOSIS — R809 Proteinuria, unspecified: Secondary | ICD-10-CM

## 2018-01-06 DIAGNOSIS — I10 Essential (primary) hypertension: Secondary | ICD-10-CM

## 2018-01-06 DIAGNOSIS — Z6841 Body Mass Index (BMI) 40.0 and over, adult: Secondary | ICD-10-CM

## 2018-01-06 DIAGNOSIS — J4541 Moderate persistent asthma with (acute) exacerbation: Secondary | ICD-10-CM | POA: Diagnosis not present

## 2018-01-06 DIAGNOSIS — Z794 Long term (current) use of insulin: Secondary | ICD-10-CM

## 2018-01-06 DIAGNOSIS — F319 Bipolar disorder, unspecified: Secondary | ICD-10-CM | POA: Diagnosis not present

## 2018-01-06 DIAGNOSIS — E1129 Type 2 diabetes mellitus with other diabetic kidney complication: Secondary | ICD-10-CM

## 2018-01-06 DIAGNOSIS — Z23 Encounter for immunization: Secondary | ICD-10-CM | POA: Diagnosis not present

## 2018-01-06 MED ORDER — AZITHROMYCIN 250 MG PO TABS: ORAL_TABLET | ORAL | 0 refills | Status: DC

## 2018-01-06 MED ORDER — PREDNISONE 10 MG PO TABS: 10.0000 mg | ORAL_TABLET | Freq: Every day | ORAL | 0 refills | Status: DC

## 2018-01-06 MED ORDER — LOSARTAN POTASSIUM 50 MG PO TABS: 50.0000 mg | ORAL_TABLET | Freq: Every day | ORAL | 3 refills | Status: DC

## 2018-01-06 MED ORDER — UMECLIDINIUM BROMIDE 62.5 MCG/INH IN AEPB: 1.0000 | INHALATION_SPRAY | Freq: Every day | RESPIRATORY_TRACT | 11 refills | Status: DC

## 2018-01-06 MED ORDER — ALBUTEROL SULFATE HFA 108 (90 BASE) MCG/ACT IN AERS: 1.0000 | INHALATION_SPRAY | RESPIRATORY_TRACT | 11 refills | Status: DC | PRN

## 2018-01-06 NOTE — Patient Instructions (Addendum)
Chronic Bronchitis Chronic bronchitis is a lasting inflammation of the bronchial tubes, which are the tubes that carry air into your lungs. This is inflammation that occurs:  On most days of the week.  For at least three months at a time.  Over a period of two years in a row.  When the bronchial tubes are inflamed, they start to produce mucus. The inflammation and buildup of mucus make it more difficult to breathe. Chronic bronchitis is usually a permanent problem and is one type of chronic obstructive pulmonary disease (COPD). People with chronic bronchitis are at greater risk for getting repeated colds, or respiratory infections. What are the causes? Chronic bronchitis most often occurs in people who have:  Long-standing, severe asthma.  A history of smoking.  Asthma and who also smoke.  What are the signs or symptoms? Chronic bronchitis may cause the following:  A cough that brings up mucus (productive cough).  Shortness of breath.  Early morning headache.  Wheezing.  Chest discomfort.  Recurring respiratory infections.  How is this diagnosed? Your health care provider may confirm the diagnosis by:  Taking your medical history.  Performing a physical exam.  Taking a chest X-ray.  Performing pulmonary function tests.  How is this treated? Treatment involves controlling symptoms with medicines, oxygen therapy, or making lifestyle changes, such as exercising and eating a healthy, well-balanced diet. Medicines could include:  Inhalers to improve air flow in and out of your lungs.  Antibiotics to treat bacterial infections, such as pneumonia, sinus infections, and acute bronchitis.  As a preventative measure, your health care provider may recommend routine vaccinations for influenza and pneumonia. This is to prevent infection and hospitalization since you may be more at risk for these types of infections. Follow these instructions at home:  Take medicines only as  directed by your health care provider.  If you smoke cigarettes, chew tobacco, or use electronic cigarettes, quit. If you need help quitting, ask your health care provider.  Avoid pollen, dust, animal dander, molds, smoke, and other things that cause shortness of breath or wheezing attacks.  Talk to your health care provider about possible exercise routines. Regular exercise is very important to help you feel better.  If you are prescribed oxygen use at home follow these guidelines: ? Never smoke while using oxygen. Oxygen does not burn or explode, but flammable materials will burn faster in the presence of oxygen. ? Keep a fire extinguisher close by. Let your fire department know that you have oxygen in your home. ? Warn visitors not to smoke near you when you are using oxygen. Put up "no smoking" signs in your home where you most often use the oxygen. ? Regularly test your smoke detectors at home to make sure they work. If you receive care in your home from a nurse or other health care provider, he or she may also check to make sure your smoke detectors work.  Ask your health care provider whether you would benefit from a pulmonary rehabilitation program.  Do not wait to get medical care if you have any concerning symptoms. Delays could cause permanent injury and may be life threatening. Contact a health care provider if:  You have increased coughing or shortness of breath or both.  You have muscle aches.  You have chest pain.  Your mucus gets thicker.  Your mucus changes from clear or white to yellow, green, gray, or bloody. Get help right away if:  Your usual medicines do not stop   your wheezing.  You have increased difficulty breathing.  You have any problems with the medicine you are taking, such as a rash, itching, swelling, or trouble breathing. This information is not intended to replace advice given to you by your health care provider. Make sure you discuss any questions  you have with your health care provider. Document Released: 11/30/2005 Document Revised: 06/23/2015 Document Reviewed: 03/23/2013 Elsevier Interactive Patient Education  2018 [employer].  Losartan tablets What is this medicine? LOSARTAN (loe SAR tan) is used to treat high blood pressure and to reduce the risk of stroke in certain patients. This drug also slows the progression of kidney disease in patients with diabetes. This medicine may be used for other purposes; ask your health care provider or pharmacist if you have questions. COMMON BRAND NAME(S): Cozaar What should I tell my health care provider before I take this medicine? They need to know if you have any of these conditions: -heart failure -kidney or liver disease -an unusual or allergic reaction to losartan, other medicines, foods, dyes, or preservatives -pregnant or trying to get pregnant -breast-feeding How should I use this medicine? Take this medicine by mouth with a glass of water. Follow the directions on the prescription label. This medicine can be taken with or without food. Take your doses at regular intervals. Do not take your medicine more often than directed. Talk to your pediatrician regarding the use of this medicine in children. Special care may be needed. Overdosage: If you think you have taken too much of this medicine contact a poison control center or emergency room at once. NOTE: This medicine is only for you. Do not share this medicine with others. What if I miss a dose? If you miss a dose, take it as soon as you can. If it is almost time for your next dose, take only that dose. Do not take double or extra doses. What may interact with this medicine? -blood pressure medicines -diuretics, especially triamterene, spironolactone, or amiloride -fluconazole -NSAIDs, medicines for pain and inflammation, like ibuprofen or naproxen -potassium salts or potassium supplements -rifampin This list may not describe  all possible interactions. Give your health care provider a list of all the medicines, herbs, non-prescription drugs, or dietary supplements you use. Also tell them if you smoke, drink alcohol, or use illegal drugs. Some items may interact with your medicine. What should I watch for while using this medicine? Visit your doctor or health care professional for regular checks on your progress. Check your blood pressure as directed. Ask your doctor or health care professional what your blood pressure should be and when you should contact him or her. Call your doctor or health care professional if you notice an irregular or fast heart beat. Women should inform their doctor if they wish to become pregnant or think they might be pregnant. There is a potential for serious side effects to an unborn child, particularly in the second or third trimester. Talk to your health care professional or pharmacist for more information. You may get drowsy or dizzy. Do not drive, use machinery, or do anything that needs mental alertness until you know how this drug affects you. Do not stand or sit up quickly, especially if you are an older patient. This reduces the risk of dizzy or fainting spells. Alcohol can make you more drowsy and dizzy. Avoid alcoholic drinks. Avoid salt substitutes unless you are told otherwise by your doctor or health care professional. Do not treat yourself for coughs, colds,  or pain while you are taking this medicine without asking your doctor or health care professional for advice. Some ingredients may increase your blood pressure. What side effects may I notice from receiving this medicine? Side effects that you should report to your doctor or health care professional as soon as possible: -confusion, dizziness, light headedness or fainting spells -decreased amount of urine passed -difficulty breathing or swallowing, hoarseness, or tightening of the throat -fast or irregular heart beat, palpitations,  or chest pain -skin rash, itching -swelling of your face, lips, tongue, hands, or feet Side effects that usually do not require medical attention (report to your doctor or health care professional if they continue or are bothersome): -cough -decreased sexual function or desire -headache -nasal congestion or stuffiness -nausea or stomach pain -sore or cramping muscles This list may not describe all possible side effects. Call your doctor for medical advice about side effects. You may report side effects to FDA at 1-800-FDA-1088. Where should I keep my medicine? Keep out of the reach of children. Store at room temperature between 15 and 30 degrees C (59 and 86 degrees F). Protect from light. Keep container tightly closed. Throw away any unused medicine after the expiration date. NOTE: This sheet is a summary. It may not cover all possible information. If you have questions about this medicine, talk to your doctor, pharmacist, or health care provider.  2018 Elsevier/Gold Standard (2007-04-25 16:42:18)  Microalbumin Test Why am I having this test? Albumin is a protein in your body that helps regulate how much water is in your blood. As your kidneys filter your blood to get rid of waste products through your urine, albumin should remain in your bloodstream. However, certain types of kidney disease can cause albumin to move from damaged blood vessels inside your kidneys and into your urine. When this happens, small amounts of albumin (microalbumin, MA) can be detected in your urine. This type of kidney damage is a common complication of diabetes mellitus, especially when blood sugar (glucose) has not been well controlled. The MA test may also help your health care provider diagnose other related medical conditions such as cardiovascular disease and high blood pressure. The MA test is often performed along with calculating urine creatinine levels. Creatinine is another waste product filtered out of  your blood by your kidneys. You may have this test if:  You have diabetes and are showing signs of kidney damage.  Your health care provider wants to determine how well your blood glucose has been controlled over many years.  Your health care provider wants to determine your kidney function when other related tests are normal.  What kind of sample is taken? A urine sample is collected in a sterile container given to you by the lab. What are the reference values? Reference valuesare considered healthy valuesestablished after testing a large group of healthy people. Reference values may vary among different people, labs, and hospitals. It is your responsibility to obtain your test results. Ask the lab or department performing the test when and how you will get your results. A reference value for MA is any value less than 2 mg/L. A reference value for MA compared to creatinine is:  Men: less than 17 mg/g creatinine.  Women: less than 25 mg/g creatinine.  What do the results mean? MA test results that are higher than the reference values may indicate many health conditions. These may include:  Diabetes mellitus.  Poorly controlled diabetes mellitus.  Myoglobulinuria.  Hemoglobinuria.  Bence-Jones  proteinuria.  Use of medicines or drugs that are damaging to the kidneys.  Atherosclerosis.  Blood fat (lipid) abnormalities.  Insulin resistance.  High blood pressure.  Heart attack.  Talk with your health care provider to discuss your results, treatment options, and if necessary, the need for more tests. Talk with your health care provider if you have any questions about your results. Talk with your health care provider to discuss your results, treatment options, and if necessary, the need for more tests. Talk with your health care provider if you have any questions about your results. This information is not intended to replace advice given to you by your health care provider.  Make sure you discuss any questions you have with your health care provider. Document Released: 03/17/2004 Document Revised: 10/19/2015 Document Reviewed: 07/03/2013 Elsevier Interactive Patient Education  2018 [employer].

## 2018-01-06 NOTE — Progress Notes (Signed)
Patient: Joanna Bishop Female    DOB: 1955-03-11   62 y.o.   MRN: [id number] Visit Date: 01/09/2018  Today's Provider: Margaretann Loveless, PA-C   Chief Complaint  Patient presents with  . Follow-up    diabetes   Subjective:    HPI  Diabetes Mellitus Type II, Follow-up:   Lab Results  Component Value Date   HGBA1C 6.4 (H) 12/20/2017   HGBA1C 7.9 (H) 08/13/2016   HGBA1C 6.8 (H) 10/14/2015    She reports excellent compliance with treatment. She is not having side effects.  Current symptoms include none and have been stable. Home blood sugar records: not checking   Current Insulin Regimen: none Most Recent Eye Exam: 09/2017 Dr.Shade in [place] [phone] Weight trend: stable Current diet: well balanced Current exercise: some  Pertinent Labs:    Component Value Date/Time   CHOL 168 12/20/2017 0924   TRIG 310 (H) 12/20/2017 0924   HDL 35 (L) 12/20/2017 0924   LDLCALC 71 12/20/2017 0924   CREATININE 0.96 12/20/2017 0924    Wt Readings from Last 3 Encounters:  01/06/18 259 lb (117.5 kg)  12/20/17 248 lb (112.5 kg)  03/15/17 258 lb (117 kg)   ------------------------------------------------------------------------     Allergies  Allergen Reactions  . Penicillins      Current Outpatient Medications:  .  albuterol (PROVENTIL HFA;VENTOLIN HFA) 108 (90 Base) MCG/ACT inhaler, Inhale 1-2 puffs into the lungs every 4 (four) hours as needed for wheezing or shortness of breath. Every 4-6 hours as needed, Disp: 1 Inhaler, Rfl: 11 .  aspirin 81 MG tablet, Take by mouth daily., Disp: , Rfl:  .  benztropine (COGENTIN) 0.5 MG tablet, TAKE 1 TABLET (0.5 MG TOTAL) BY MOUTH 2 (TWO) TIMES DAILY., Disp: 180 tablet, Rfl: 3 .  Calcium-Magnesium-Vitamin D (CALCIUM 500 PO), Take 1 tablet by mouth 2 (two) times daily., Disp: , Rfl:  .  divalproex (DEPAKOTE ER) 500 MG 24 hr tablet, TAKE 2 TABLETS (1,000 MG TOTAL) BY MOUTH AT BEDTIME., Disp: 180 tablet, Rfl: 3 .  glipiZIDE  (GLUCOTROL XL) 10 MG 24 hr tablet, TAKE 1 TABLET (10 MG TOTAL) BY MOUTH DAILY WITH BREAKFAST., Disp: 90 tablet, Rfl: 1 .  metFORMIN (GLUCOPHAGE) 500 MG tablet, TAKE 1 TABLET (500 MG TOTAL) BY MOUTH 2 (TWO) TIMES DAILY WITH A MEAL., Disp: 180 tablet, Rfl: 1 .  montelukast (SINGULAIR) 10 MG tablet, TAKE 1 TABLET BY MOUTH EVERY DAY, Disp: 90 tablet, Rfl: 3 .  umeclidinium bromide (INCRUSE ELLIPTA) 62.5 MCG/INH AEPB, Inhale 1 puff into the lungs daily., Disp: 30 each, Rfl: 11 .  azithromycin (ZITHROMAX) 250 MG tablet, Take 2 tablets PO on day one, and one tablet PO daily thereafter until completed., Disp: 6 tablet, Rfl: 0 .  fluticasone (FLONASE) 50 MCG/ACT nasal spray, Place 2 sprays into the nose daily., Disp: , Rfl:  .  losartan (COZAAR) 25 MG tablet, TAKE 2 TABLETS BY MOUTH EVERY DAY, Disp: 180 tablet, Rfl: 0 .  predniSONE (DELTASONE) 10 MG tablet, Take 1 tablet (10 mg total) by mouth daily with breakfast., Disp: 21 tablet, Rfl: 0  Review of Systems  Constitutional: Negative.   Respiratory: Negative.   Cardiovascular: Negative.   Gastrointestinal: Negative.   Endocrine: Negative.   Neurological: Negative.     Social History   Tobacco Use  . Smoking status: Never Smoker  . Smokeless tobacco: Never Used  Substance Use Topics  . Alcohol use: Yes    Alcohol/week: 0.0  standard drinks    Comment: 1/2 ca of beer every 2 weeks   Objective:   BP (!) 160/100 (BP Location: Left Wrist, Patient Position: Sitting, Cuff Size: Normal)   Pulse 70   Temp 98.3 F (36.8 C) (Oral)   Resp 16   Wt 259 lb (117.5 kg)   SpO2 97%   BMI 44.46 kg/m  Vitals:   01/06/18 1045  BP: (!) 160/100  Pulse: 70  Resp: 16  Temp: 98.3 F (36.8 C)  TempSrc: Oral  SpO2: 97%  Weight: 259 lb (117.5 kg)     Physical Exam  Constitutional: She appears well-developed and well-nourished. No distress.  Neck: Normal range of motion. Neck supple.  Cardiovascular: Normal rate, regular rhythm and normal heart sounds.  Exam reveals no gallop and no friction rub.  No murmur heard. Pulmonary/Chest: Effort normal and breath sounds normal. No respiratory distress. She has no wheezes. She has no rales.  Musculoskeletal: She exhibits edema (trace).  Skin: She is not diaphoretic.  Psychiatric: She has a normal mood and affect. Her behavior is normal.  Vitals reviewed.  Diabetic Foot Exam - Simple   Simple Foot Form Diabetic Foot exam was performed with the following findings:  Yes 01/06/2018  3:12 PM  Visual Inspection No deformities, no ulcerations, no other skin breakdown bilaterally:  Yes Sensation Testing Intact to touch and monofilament testing bilaterally:  Yes Pulse Check Posterior Tibialis and Dorsalis pulse intact bilaterally:  Yes Comments        Assessment & Plan:     1. Type 2 diabetes mellitus with microalbuminuria, with long-term current use of insulin (HCC) Doing well. A1c now 6.4. Continue metformin 500mg  BID and glipizide XR 10mg .   2. Class 3 severe obesity due to excess calories with serious comorbidity and body mass index (BMI) of 40.0 to 44.9 in adult [facility]) Counseled patient on healthy lifestyle modifications including dieting and exercise.   3. Moderate persistent asthmatic bronchitis with acute exacerbation Worsening. Will treat with Zpak and prednisone taper. Inhalers refilled.  - albuterol (PROVENTIL HFA;VENTOLIN HFA) 108 (90 Base) MCG/ACT inhaler; Inhale 1-2 puffs into the lungs every 4 (four) hours as needed for wheezing or shortness of breath. Every 4-6 hours as needed  Dispense: 1 Inhaler; Refill: 11 - umeclidinium bromide (INCRUSE ELLIPTA) 62.5 MCG/INH AEPB; Inhale 1 puff into the lungs daily.  Dispense: 30 each; Refill: 11 - predniSONE (DELTASONE) 10 MG tablet; Take 1 tablet (10 mg total) by mouth daily with breakfast.  Dispense: 21 tablet; Refill: 0  4. Bipolar depression (HCC) Stable. Continue benzotropine 0.5mg  BID, depakote ER 1000mg  at bedtime.   5. Essential  hypertension Elevated today. Continue losartan 25mg .  6. Need for influenza vaccination Flu vaccine given today without complication. Patient sat upright for 15 minutes to check for adverse reaction before being released. - Flu Vaccine QUAD 36+ mos IM       Margaretann Loveless, PA-C  [facility]

## 2018-01-08 ENCOUNTER — Telehealth: Payer: Self-pay | Admitting: Physician Assistant

## 2018-01-08 DIAGNOSIS — J4541 Moderate persistent asthma with (acute) exacerbation: Secondary | ICD-10-CM

## 2018-01-08 MED ORDER — AZITHROMYCIN 250 MG PO TABS: ORAL_TABLET | ORAL | 0 refills | Status: DC

## 2018-01-08 NOTE — Telephone Encounter (Signed)
Pt has misplaced or thrown away her antibiotics that [place] prescribed on Mon.  Pt had about 4 left to take. Asking if more could be called in for her.  Please advise.  Thanks, [employer]

## 2018-01-08 NOTE — Telephone Encounter (Signed)
Sent in

## 2018-01-08 NOTE — Telephone Encounter (Signed)
Patient was advised.  

## 2018-01-16 ENCOUNTER — Other Ambulatory Visit: Payer: Self-pay | Admitting: Physician Assistant

## 2018-01-16 DIAGNOSIS — F319 Bipolar disorder, unspecified: Secondary | ICD-10-CM

## 2018-01-16 DIAGNOSIS — E119 Type 2 diabetes mellitus without complications: Secondary | ICD-10-CM

## 2018-01-16 DIAGNOSIS — J301 Allergic rhinitis due to pollen: Secondary | ICD-10-CM

## 2018-01-16 NOTE — Telephone Encounter (Signed)
Pt needing refills on:  glipiZIDE (GLUCOTROL XL) 10 MG 24 hr tablet benztropine (COGENTIN) 0.5 MG tablet montelukast (SINGULAIR) 10 MG tablet  [facility] fill at: [facility] [id number] [facility] [place], [place] - [address] [phone] (Phone) [phone] (Fax)   Thanks, [employer]

## 2018-01-17 MED ORDER — BENZTROPINE MESYLATE 0.5 MG PO TABS: 0.5000 mg | ORAL_TABLET | Freq: Two times a day (BID) | ORAL | 3 refills | Status: DC

## 2018-01-17 MED ORDER — MONTELUKAST SODIUM 10 MG PO TABS: 10.0000 mg | ORAL_TABLET | Freq: Every day | ORAL | 3 refills | Status: DC

## 2018-01-17 MED ORDER — GLIPIZIDE ER 10 MG PO TB24: 10.0000 mg | ORAL_TABLET | Freq: Every day | ORAL | 1 refills | Status: DC

## 2018-01-21 ENCOUNTER — Telehealth: Payer: Self-pay

## 2018-01-21 DIAGNOSIS — J4541 Moderate persistent asthma with (acute) exacerbation: Secondary | ICD-10-CM

## 2018-01-21 MED ORDER — AZITHROMYCIN 250 MG PO TABS: ORAL_TABLET | ORAL | 0 refills | Status: DC

## 2018-01-21 MED ORDER — PREDNISONE 10 MG PO TABS: 10.0000 mg | ORAL_TABLET | Freq: Every day | ORAL | 0 refills | Status: DC

## 2018-01-21 NOTE — Telephone Encounter (Signed)
Patient was seen and treated for bronchitis earlier this month.  She states the symptoms are back and wants to know if ou can send in another antibiotic or will she need to be seen.  [facility] and her CB # is [phone]

## 2018-01-21 NOTE — Telephone Encounter (Signed)
Patient was advised.  

## 2018-01-21 NOTE — Telephone Encounter (Signed)
Sent in meds for her.

## 2018-01-30 DIAGNOSIS — M1711 Unilateral primary osteoarthritis, right knee: Secondary | ICD-10-CM | POA: Diagnosis not present

## 2018-02-04 DIAGNOSIS — M1711 Unilateral primary osteoarthritis, right knee: Secondary | ICD-10-CM | POA: Diagnosis not present

## 2018-02-12 DIAGNOSIS — M170 Bilateral primary osteoarthritis of knee: Secondary | ICD-10-CM | POA: Diagnosis not present

## 2018-02-21 ENCOUNTER — Other Ambulatory Visit: Payer: Self-pay

## 2018-02-21 DIAGNOSIS — F319 Bipolar disorder, unspecified: Secondary | ICD-10-CM

## 2018-02-21 MED ORDER — DIVALPROEX SODIUM ER 500 MG PO TB24: 1000.0000 mg | ORAL_TABLET | Freq: Every day | ORAL | 3 refills | Status: DC

## 2018-02-21 NOTE — Telephone Encounter (Signed)
Patient called office request refill on [facility] ER [facility]

## 2018-02-24 DIAGNOSIS — M170 Bilateral primary osteoarthritis of knee: Secondary | ICD-10-CM | POA: Diagnosis not present

## 2018-02-24 DIAGNOSIS — M1711 Unilateral primary osteoarthritis, right knee: Secondary | ICD-10-CM | POA: Diagnosis not present

## 2018-03-06 DIAGNOSIS — M170 Bilateral primary osteoarthritis of knee: Secondary | ICD-10-CM | POA: Diagnosis not present

## 2018-03-06 DIAGNOSIS — M25661 Stiffness of right knee, not elsewhere classified: Secondary | ICD-10-CM | POA: Diagnosis not present

## 2018-03-06 DIAGNOSIS — M25561 Pain in right knee: Secondary | ICD-10-CM | POA: Diagnosis not present

## 2018-03-06 DIAGNOSIS — Z01818 Encounter for other preprocedural examination: Secondary | ICD-10-CM | POA: Diagnosis not present

## 2018-03-11 NOTE — Progress Notes (Signed)
Patient: Joanna Bishop Female    DOB: 07/18/1955   63 y.o.   MRN: [id number] Visit Date: 03/14/2018  Today's Provider: Margaretann LovelessJennifer M Neri Vieyra, PA-C   Chief Complaint  Patient presents with  . Pre-op Exam   Subjective:     HPI   Patient is here for pre-op for right knee replacement. Patient is having surgery [facility]/21/2018 [facility] in [place] by Dr. Odis LusterBowers. She denies any previous complications from anesthesia.   Allergies  Allergen Reactions  . Penicillins      Current Outpatient Medications:  .  albuterol (PROVENTIL HFA;VENTOLIN HFA) 108 (90 Base) MCG/ACT inhaler, Inhale 1-2 puffs into the lungs every 4 (four) hours as needed for wheezing or shortness of breath. Every 4-6 hours as needed, Disp: 1 Inhaler, Rfl: 11 .  aspirin 81 MG tablet, Take by mouth daily., Disp: , Rfl:  .  benztropine (COGENTIN) 0.5 MG tablet, Take 1 tablet (0.5 mg total) by mouth 2 (two) times daily., Disp: 180 tablet, Rfl: 3 .  Calcium-Magnesium-Vitamin D (CALCIUM 500 PO), Take 1 tablet by mouth 2 (two) times daily., Disp: , Rfl:  .  divalproex (DEPAKOTE ER) 500 MG 24 hr tablet, Take 2 tablets (1,000 mg total) by mouth at bedtime., Disp: 180 tablet, Rfl: 3 .  fluticasone (FLONASE) 50 MCG/ACT nasal spray, Place 2 sprays into the nose daily., Disp: , Rfl:  .  glipiZIDE (GLUCOTROL XL) 10 MG 24 hr tablet, Take 1 tablet (10 mg total) by mouth daily with breakfast., Disp: 90 tablet, Rfl: 1 .  losartan (COZAAR) 25 MG tablet, TAKE 2 TABLETS BY MOUTH EVERY DAY, Disp: 180 tablet, Rfl: 0 .  metFORMIN (GLUCOPHAGE) 500 MG tablet, TAKE 1 TABLET (500 MG TOTAL) BY MOUTH 2 (TWO) TIMES DAILY WITH A MEAL., Disp: 180 tablet, Rfl: 1 .  montelukast (SINGULAIR) 10 MG tablet, Take 1 tablet (10 mg total) by mouth daily., Disp: 90 tablet, Rfl: 3 .  NON FORMULARY, Bladder Control Supplement Capsule, Disp: , Rfl:  .  umeclidinium bromide (INCRUSE ELLIPTA) 62.5 MCG/INH AEPB, Inhale 1 puff into the lungs daily., Disp: 30  each, Rfl: 11 .  azithromycin (ZITHROMAX) 250 MG tablet, Take 2 tablets PO on day one, and one tablet PO daily thereafter until completed. (Patient not taking: Reported on 03/12/2018), Disp: 6 tablet, Rfl: 0 .  predniSONE (DELTASONE) 10 MG tablet, Take 1 tablet (10 mg total) by mouth daily with breakfast. (Patient not taking: Reported on 03/12/2018), Disp: 21 tablet, Rfl: 0  Review of Systems  Constitutional: Negative for appetite change, chills, fatigue and fever.  Respiratory: Negative for chest tightness and shortness of breath.   Cardiovascular: Negative for chest pain and palpitations.  Gastrointestinal: Negative for abdominal pain, nausea and vomiting.  Neurological: Negative for dizziness and weakness.    Social History   Tobacco Use  . Smoking status: Never Smoker  . Smokeless tobacco: Never Used  Substance Use Topics  . Alcohol use: Yes    Alcohol/week: 0.0 standard drinks    Comment: 1/2 ca of beer every 2 weeks      Objective:   There were no vitals taken for this visit. There were no vitals filed for this visit.   Physical Exam      Assessment & Plan    1. 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 She did have some left axis deviation noted on  EKG from 2017, but not a full on LAFB as it does today. No ST changes. No ischemic changes noted. Sinus tachycardia rate of 101 noted. CXR was ordered and obtained due to h/o asthmatic bronchitis. CXR normal. Labs ordered as below. All labs unremarkable with exception of A1c, which is now 8.8 (was 6.4). Patient is on metformin 500mg  BID. Advised patient we will do short dosing of insulin over the next week to try to lower A1c more. She is in agreement. With these results the patient does have a low risk of postoperative cardiovascular complications at 0.1% per the GUPTA risk calculator. - EKG 12-Lead - POCT Urinalysis Dipstick - DG Chest 2 View;  Future - CBC w/Diff/Platelet - Comprehensive Metabolic Panel (CMET) - HgB A1c  2. Moderate persistent asthmatic bronchitis without complication See above medical treatment plan. - DG Chest 2 View; Future  3. Type 2 diabetes mellitus without complication, without long-term current use of insulin (HCC) A1c up to 8.8 from 6.4. Continue metformin 500mg  BID. Patient will schedule to come back on Monday 03/17/18 to start daily insulin injection prior to surgery to optimize treatment prior to surgery on 03/21/18.  - CBC w/Diff/Platelet - Comprehensive Metabolic Panel (CMET) - HgB A1c  4. Continuous leakage of urine Using adult briefs now. May consider further evaluation once knee surgery is completed and she is cleared.     Margaretann LovelessJennifer M Kimball Appleby, PA-C  [facility]

## 2018-03-12 ENCOUNTER — Ambulatory Visit
Admission: RE | Admit: 2018-03-12 | Discharge: 2018-03-12 | Disposition: A | Discharge status: Home / Self Care | Payer: Medicare Other | Attending: Physician Assistant | Admitting: Physician Assistant

## 2018-03-12 ENCOUNTER — Ambulatory Visit (INDEPENDENT_AMBULATORY_CARE_PROVIDER_SITE_OTHER): Payer: Medicare Other | Admitting: Physician Assistant

## 2018-03-12 ENCOUNTER — Ambulatory Visit
Admission: RE | Admit: 2018-03-12 | Discharge: 2018-03-12 | Disposition: A | Discharge status: Home / Self Care | Payer: Medicare Other | Source: Ambulatory Visit | Attending: Physician Assistant | Admitting: Physician Assistant

## 2018-03-12 ENCOUNTER — Encounter: Payer: Self-pay | Admitting: Physician Assistant

## 2018-03-12 DIAGNOSIS — Z01818 Encounter for other preprocedural examination: Secondary | ICD-10-CM | POA: Diagnosis not present

## 2018-03-12 DIAGNOSIS — E119 Type 2 diabetes mellitus without complications: Secondary | ICD-10-CM

## 2018-03-12 DIAGNOSIS — J4540 Moderate persistent asthma, uncomplicated: Secondary | ICD-10-CM

## 2018-03-12 DIAGNOSIS — N3945 Continuous leakage: Secondary | ICD-10-CM | POA: Diagnosis not present

## 2018-03-12 DIAGNOSIS — Z471 Aftercare following joint replacement surgery: Secondary | ICD-10-CM | POA: Diagnosis not present

## 2018-03-12 DIAGNOSIS — Z96653 Presence of artificial knee joint, bilateral: Secondary | ICD-10-CM | POA: Diagnosis not present

## 2018-03-12 LAB — POCT URINALYSIS DIPSTICK
APPEARANCE: NORMAL
Bilirubin, UA: NEGATIVE
Blood, UA: NEGATIVE
GLUCOSE UA: NEGATIVE
Ketones, UA: NEGATIVE
LEUKOCYTES UA: NEGATIVE
Nitrite, UA: NEGATIVE
Odor: NORMAL
Protein, UA: POSITIVE — AB
Spec Grav, UA: 1.015 (ref 1.010–1.025)
Urobilinogen, UA: 0.2 E.U./dL
pH, UA: 6 (ref 5.0–8.0)

## 2018-03-13 LAB — COMPREHENSIVE METABOLIC PANEL
ALK PHOS: 95 IU/L (ref 39–117)
ALT: 45 IU/L — AB (ref 0–32)
AST: 34 IU/L (ref 0–40)
Albumin/Globulin Ratio: 1.8 (ref 1.2–2.2)
Albumin: 4.2 g/dL (ref 3.6–4.8)
BILIRUBIN TOTAL: 0.4 mg/dL (ref 0.0–1.2)
BUN / CREAT RATIO: 21 (ref 12–28)
BUN: 20 mg/dL (ref 8–27)
CHLORIDE: 98 mmol/L (ref 96–106)
CO2: 24 mmol/L (ref 20–29)
Calcium: 10.1 mg/dL (ref 8.7–10.3)
Creatinine, Ser: 0.96 mg/dL (ref 0.57–1.00)
GFR calc Af Amer: 74 mL/min/{1.73_m2} (ref >59)
GFR calc non Af Amer: 64 mL/min/{1.73_m2} (ref >59)
GLUCOSE: 170 mg/dL — AB (ref 65–99)
Globulin, Total: 2.3 g/dL (ref 1.5–4.5)
Potassium: 4.5 mmol/L (ref 3.5–5.2)
Sodium: 142 mmol/L (ref 134–144)
Total Protein: 6.5 g/dL (ref 6.0–8.5)

## 2018-03-13 LAB — CBC WITH DIFFERENTIAL/PLATELET
BASOS ABS: 0.1 10*3/uL (ref 0.0–0.2)
Basos: 1 %
EOS (ABSOLUTE): 0.3 10*3/uL (ref 0.0–0.4)
Eos: 3 %
Hematocrit: 40.6 % (ref 34.0–46.6)
Hemoglobin: 13.6 g/dL (ref 11.1–15.9)
Immature Grans (Abs): 0.1 10*3/uL (ref 0.0–0.1)
Immature Granulocytes: 1 %
LYMPHS ABS: 3.1 10*3/uL (ref 0.7–3.1)
LYMPHS: 38 %
MCH: 30.4 pg (ref 26.6–33.0)
MCHC: 33.5 g/dL (ref 31.5–35.7)
MCV: 91 fL (ref 79–97)
Monocytes Absolute: 0.7 10*3/uL (ref 0.1–0.9)
Monocytes: 8 %
NEUTROS ABS: 4 10*3/uL (ref 1.4–7.0)
Neutrophils: 49 %
Platelets: 191 10*3/uL (ref 150–450)
RBC: 4.47 x10E6/uL (ref 3.77–5.28)
RDW: 13.2 % (ref 11.7–15.4)
WBC: 8.1 10*3/uL (ref 3.4–10.8)

## 2018-03-13 LAB — HEMOGLOBIN A1C
Est. average glucose Bld gHb Est-mCnc: 206 mg/dL
Hgb A1c MFr Bld: 8.8 % — ABNORMAL HIGH (ref 4.8–5.6)

## 2018-03-14 DIAGNOSIS — Z01810 Encounter for preprocedural cardiovascular examination: Secondary | ICD-10-CM | POA: Diagnosis not present

## 2018-03-14 DIAGNOSIS — Z01812 Encounter for preprocedural laboratory examination: Secondary | ICD-10-CM | POA: Diagnosis not present

## 2018-03-14 DIAGNOSIS — M1711 Unilateral primary osteoarthritis, right knee: Secondary | ICD-10-CM | POA: Diagnosis not present

## 2018-03-14 DIAGNOSIS — Z01811 Encounter for preprocedural respiratory examination: Secondary | ICD-10-CM | POA: Diagnosis not present

## 2018-03-14 DIAGNOSIS — Z01818 Encounter for other preprocedural examination: Secondary | ICD-10-CM | POA: Diagnosis not present

## 2018-03-14 DIAGNOSIS — E119 Type 2 diabetes mellitus without complications: Secondary | ICD-10-CM | POA: Diagnosis not present

## 2018-03-14 NOTE — Progress Notes (Signed)
Patient: Joanna Bishop Female    DOB: 1955-04-04   63 y.o.   MRN: [id number] Visit Date: 03/17/2018  Today's Provider: Margaretann Loveless, PA-C   No chief complaint on file.  Subjective:     HPI  Astrea Pent is here today to discuss starting basal insulin to lower blood sugar and A1c prior to TKR scheduled for 03/21/18.   Patient came in last week for pre-op and A1c was found to have increased from 6.4 to 8.8. To reduce risk of post operative infection it was decided to start basal insulin at bedtime through Thursday. She is here today for education on giving injections.   Allergies  Allergen Reactions  . Penicillins      Current Outpatient Medications:  .  albuterol (PROVENTIL HFA;VENTOLIN HFA) 108 (90 Base) MCG/ACT inhaler, Inhale 1-2 puffs into the lungs every 4 (four) hours as needed for wheezing or shortness of breath. Every 4-6 hours as needed, Disp: 1 Inhaler, Rfl: 11 .  aspirin 81 MG tablet, Take by mouth daily., Disp: , Rfl:  .  benztropine (COGENTIN) 0.5 MG tablet, Take 1 tablet (0.5 mg total) by mouth 2 (two) times daily., Disp: 180 tablet, Rfl: 3 .  Calcium-Magnesium-Vitamin D (CALCIUM 500 PO), Take 1 tablet by mouth 2 (two) times daily., Disp: , Rfl:  .  divalproex (DEPAKOTE ER) 500 MG 24 hr tablet, Take 2 tablets (1,000 mg total) by mouth at bedtime., Disp: 180 tablet, Rfl: 3 .  fluticasone (FLONASE) 50 MCG/ACT nasal spray, Place 2 sprays into the nose daily., Disp: , Rfl:  .  glipiZIDE (GLUCOTROL XL) 10 MG 24 hr tablet, Take 1 tablet (10 mg total) by mouth daily with breakfast., Disp: 90 tablet, Rfl: 1 .  losartan (COZAAR) 25 MG tablet, TAKE 2 TABLETS BY MOUTH EVERY DAY, Disp: 180 tablet, Rfl: 0 .  metFORMIN (GLUCOPHAGE) 500 MG tablet, TAKE 1 TABLET (500 MG TOTAL) BY MOUTH 2 (TWO) TIMES DAILY WITH A MEAL., Disp: 180 tablet, Rfl: 1 .  montelukast (SINGULAIR) 10 MG tablet, Take 1 tablet (10 mg total) by mouth daily., Disp: 90 tablet, Rfl: 3 .  NON FORMULARY,  Bladder Control Supplement Capsule, Disp: , Rfl:  .  predniSONE (DELTASONE) 10 MG tablet, Take 1 tablet (10 mg total) by mouth daily with breakfast., Disp: 21 tablet, Rfl: 0 .  umeclidinium bromide (INCRUSE ELLIPTA) 62.5 MCG/INH AEPB, Inhale 1 puff into the lungs daily., Disp: 30 each, Rfl: 11 .  azithromycin (ZITHROMAX) 250 MG tablet, Take 2 tablets PO on day one, and one tablet PO daily thereafter until completed. (Patient not taking: Reported on 03/17/2018), Disp: 6 tablet, Rfl: 0  Review of Systems  Constitutional: Negative for appetite change, chills, fatigue and fever.  Respiratory: Negative for chest tightness and shortness of breath.   Cardiovascular: Negative for chest pain and palpitations.  Gastrointestinal: Negative for abdominal pain, nausea and vomiting.  Neurological: Negative for dizziness and weakness.    Social History   Tobacco Use  . Smoking status: Never Smoker  . Smokeless tobacco: Never Used  Substance Use Topics  . Alcohol use: Yes    Alcohol/week: 0.0 standard drinks    Comment: 1/2 ca of beer every 2 weeks      Objective:   BP (!) 149/92 (BP Location: Right Arm, Patient Position: Sitting, Cuff Size: Large)   Pulse (!) 110   Temp 98 F (36.7 C) (Oral)   Resp 16   Ht 5\' 5"  (1.651  m)   Wt 261 lb 3.2 oz (118.5 kg)   SpO2 98%   BMI 43.47 kg/m  Vitals:   03/17/18 1020  BP: (!) 149/92  Pulse: (!) 110  Resp: 16  Temp: 98 F (36.7 C)  TempSrc: Oral  SpO2: 98%  Weight: 261 lb 3.2 oz (118.5 kg)  Height: 5\' 5"  (1.651 m)     Physical Exam Vitals signs reviewed.  Constitutional:      General: She is not in acute distress.    Appearance: She is well-developed. She is not diaphoretic.  Neck:     Musculoskeletal: Normal range of motion and neck supple.  Cardiovascular:     Rate and Rhythm: Normal rate and regular rhythm.     Heart sounds: Normal heart sounds. No murmur. No friction rub. No gallop.   Pulmonary:     Effort: Pulmonary effort is  normal. No respiratory distress.     Breath sounds: Normal breath sounds. No wheezing or rales.        Assessment & Plan    1. Type 2 diabetes mellitus without complication, without long-term current use of insulin (HCC) Basaglar insulin started at 23 units. First dose given today in the office. Next dose due Tuesday night at bedtime. Take for the next four days with last dose Thursday night to optimize sugars the best we can prior to surgery on 03/21/18. I will see her in 3 months for recheck of A1c following surgery.   2. Primary osteoarthritis of right knee Having TKR on 03/21/18.     Margaretann Loveless, PA-C  [facility]

## 2018-03-17 ENCOUNTER — Ambulatory Visit (INDEPENDENT_AMBULATORY_CARE_PROVIDER_SITE_OTHER): Payer: Medicare Other | Admitting: Physician Assistant

## 2018-03-17 ENCOUNTER — Encounter: Payer: Self-pay | Admitting: Physician Assistant

## 2018-03-17 VITALS — BP 144/84 | HR 110 | Temp 98.0°F | Resp 16 | Ht 65.0 in | Wt 261.2 lb

## 2018-03-17 DIAGNOSIS — M1711 Unilateral primary osteoarthritis, right knee: Secondary | ICD-10-CM

## 2018-03-17 DIAGNOSIS — E119 Type 2 diabetes mellitus without complications: Secondary | ICD-10-CM

## 2018-03-17 NOTE — Patient Instructions (Signed)
23 units at bedtime with Thursday night as last dose

## 2018-03-31 ENCOUNTER — Encounter: Payer: Self-pay | Admitting: Physician Assistant

## 2018-03-31 ENCOUNTER — Ambulatory Visit (INDEPENDENT_AMBULATORY_CARE_PROVIDER_SITE_OTHER): Payer: Medicare Other | Admitting: Physician Assistant

## 2018-03-31 VITALS — BP 160/90 | HR 95 | Temp 98.1°F | Resp 16 | Wt 262.8 lb

## 2018-03-31 DIAGNOSIS — E119 Type 2 diabetes mellitus without complications: Secondary | ICD-10-CM

## 2018-03-31 LAB — POCT GLYCOSYLATED HEMOGLOBIN (HGB A1C)
Est. average glucose Bld gHb Est-mCnc: 180
Hemoglobin A1C: 7.9 % — AB (ref 4.0–5.6)

## 2018-03-31 NOTE — Progress Notes (Signed)
Patient: Joanna Bishop Female    DOB: [birth year]   63 y.o.   MRN: [id number] Visit Date: 03/31/2018  Today's Provider: Margaretann Loveless, PA-C   No chief complaint on file.  Subjective:     HPI  Patient here today with c/o that her surgery was cancelled because her A1C was high and patient reports that she was told that they are going to reschedule her until her A1C goes down. She used basal insulin x 1 week. Has now went back to metformin only. Has been trying to eat less and healthier.   Lab Results  Component Value Date   HGBA1C 7.9 (A) 03/31/2018    Allergies  Allergen Reactions  . Penicillins      Current Outpatient Medications:  .  albuterol (PROVENTIL HFA;VENTOLIN HFA) 108 (90 Base) MCG/ACT inhaler, Inhale 1-2 puffs into the lungs every 4 (four) hours as needed for wheezing or shortness of breath. Every 4-6 hours as needed, Disp: 1 Inhaler, Rfl: 11 .  aspirin 81 MG tablet, Take by mouth daily., Disp: , Rfl:  .  benztropine (COGENTIN) 0.5 MG tablet, Take 1 tablet (0.5 mg total) by mouth 2 (two) times daily., Disp: 180 tablet, Rfl: 3 .  Calcium-Magnesium-Vitamin D (CALCIUM 500 PO), Take 1 tablet by mouth 2 (two) times daily., Disp: , Rfl:  .  divalproex (DEPAKOTE ER) 500 MG 24 hr tablet, Take 2 tablets (1,000 mg total) by mouth at bedtime., Disp: 180 tablet, Rfl: 3 .  fluticasone (FLONASE) 50 MCG/ACT nasal spray, Place 2 sprays into the nose daily., Disp: , Rfl:  .  glipiZIDE (GLUCOTROL XL) 10 MG 24 hr tablet, Take 1 tablet (10 mg total) by mouth daily with breakfast., Disp: 90 tablet, Rfl: 1 .  metFORMIN (GLUCOPHAGE) 500 MG tablet, TAKE 1 TABLET (500 MG TOTAL) BY MOUTH 2 (TWO) TIMES DAILY WITH A MEAL., Disp: 180 tablet, Rfl: 1 .  montelukast (SINGULAIR) 10 MG tablet, Take 1 tablet (10 mg total) by mouth daily., Disp: 90 tablet, Rfl: 3 .  NON FORMULARY, Bladder Control Supplement Capsule, Disp: , Rfl:  .  umeclidinium bromide (INCRUSE ELLIPTA) 62.5 MCG/INH AEPB,  Inhale 1 puff into the lungs daily., Disp: 30 each, Rfl: 11 .  losartan (COZAAR) 25 MG tablet, TAKE 2 TABLETS BY MOUTH EVERY DAY, Disp: 180 tablet, Rfl: 0  Review of Systems  Constitutional: Negative.   Respiratory: Negative.   Cardiovascular: Negative.   Gastrointestinal: Negative.   Endocrine: Negative.   Musculoskeletal: Positive for arthralgias.  Neurological: Negative.     Social History   Tobacco Use  . Smoking status: Never Smoker  . Smokeless tobacco: Never Used  Substance Use Topics  . Alcohol use: Yes    Alcohol/week: 0.0 standard drinks    Comment: 1/2 ca of beer every 2 weeks      Objective:   BP (!) 160/90 (BP Location: Left Arm, Patient Position: Sitting, Cuff Size: Large)   Pulse 95   Temp 98.1 F (36.7 C) (Oral)   Resp 16   Wt 262 lb 12.8 oz (119.2 kg)   BMI 43.73 kg/m  Vitals:   03/31/18 0835  BP: (!) 160/90  Pulse: 95  Resp: 16  Temp: 98.1 F (36.7 C)  TempSrc: Oral  Weight: 262 lb 12.8 oz (119.2 kg)     Physical Exam Vitals signs reviewed.  Constitutional:      General: She is not in acute distress.    Appearance: She is well-developed.  She is not diaphoretic.  Neck:     Musculoskeletal: Normal range of motion and neck supple.  Cardiovascular:     Rate and Rhythm: Normal rate and regular rhythm.     Heart sounds: Normal heart sounds. No murmur. No friction rub. No gallop.   Pulmonary:     Effort: Pulmonary effort is normal. No respiratory distress.     Breath sounds: Normal breath sounds. No wheezing or rales.        Assessment & Plan    1. Type 2 diabetes mellitus without complication, without long-term current use of insulin (HCC) A1c down to 7.9 from 8.8 at last check. Will send labs to Dr. Odis LusterBowers to see if surgery can be rescheduled. Continue medications as prescribed. I will see her back in 3 months.  - POCT glycosylated hemoglobin (Hb A1C)     Margaretann LovelessJennifer M Burnette, PA-C  [facility]

## 2018-04-03 ENCOUNTER — Other Ambulatory Visit: Payer: Self-pay | Admitting: Physician Assistant

## 2018-04-03 DIAGNOSIS — R809 Proteinuria, unspecified: Principal | ICD-10-CM

## 2018-04-03 DIAGNOSIS — Z794 Long term (current) use of insulin: Secondary | ICD-10-CM

## 2018-04-03 DIAGNOSIS — I10 Essential (primary) hypertension: Secondary | ICD-10-CM

## 2018-04-03 DIAGNOSIS — E1129 Type 2 diabetes mellitus with other diabetic kidney complication: Secondary | ICD-10-CM

## 2018-04-21 ENCOUNTER — Telehealth: Payer: Self-pay | Admitting: Physician Assistant

## 2018-04-21 NOTE — Telephone Encounter (Signed)
I called the patient to schedule her Welcome to Medicare visit that's due before 05/28/2018.  She said that she will call back when she has her calendar. VDM (DD)

## 2018-04-21 NOTE — Telephone Encounter (Signed)
Pt calling back regarding her AWV.  Please call pt back to discuss making an appt.  Please advise.  Thanks, [employer]

## 2018-05-02 NOTE — Telephone Encounter (Signed)
Please advise 

## 2018-05-02 NOTE — Telephone Encounter (Signed)
Could she do earlier than 9? I could see her at 103 or 8 on March 13. Initial welcome to medicare visits have to be seen by the provider only.   She also needs labs as well. The ortho is requesting nre labs and want to try to get A1c closer to 7.5 for her knee surgery. We could do this on that day if she could come.

## 2018-05-02 NOTE — Telephone Encounter (Addendum)
Patient called back office to arrange a Welcome to Medicare Physical, I was going to arrange appt before 05/28/18 as stated below but patient states that she can only come in AM 9-10AM and there is no availabilty on jennis schedule for those times this month, I see availability for [place] but I was trying to get patients appt closer together. Please review over schedule to see if patient can be worked in somewhere. Thanks KW.

## 2018-05-09 ENCOUNTER — Encounter: Payer: Self-pay | Admitting: Physician Assistant

## 2018-05-09 NOTE — Progress Notes (Deleted)
Patient: Joanna Bishop, Female    DOB: 1956/01/20, 63 y.o.   MRN: [id number] Visit Date: 05/09/2018  Today's Provider: Margaretann Loveless, PA-C   No chief complaint on file.  Subjective:     Annual wellness visit Joanna Bishop is a 63 y.o. female. She feels {DESC; WELL/FAIRLY WELL/POORLY:18703}. She reports exercising ***. She reports she is sleeping {DESC; WELL/FAIRLY WELL/POORLY:18703}.  -----------------------------------------------------------   Review of Systems  Constitutional: Negative.   HENT: Negative.   Eyes: Negative.   Respiratory: Negative.   Cardiovascular: Negative.   Gastrointestinal: Negative.   Endocrine: Negative.   Genitourinary: Negative.   Musculoskeletal: Negative.   Skin: Negative.   Allergic/Immunologic: Negative.   Neurological: Negative.   Hematological: Negative.   Psychiatric/Behavioral: Negative.     Social History   Socioeconomic History  . Marital status: Married    Spouse name: Not on file  . Number of children: Not on file  . Years of education: Not on file  . Highest education level: Not on file  Occupational History  . Not on file  Social Needs  . Financial resource strain: Not on file  . Food insecurity:    Worry: Not on file    Inability: Not on file  . Transportation needs:    Medical: Not on file    Non-medical: Not on file  Tobacco Use  . Smoking status: Never Smoker  . Smokeless tobacco: Never Used  Substance and Sexual Activity  . Alcohol use: Yes    Alcohol/week: 0.0 standard drinks    Comment: 1/2 ca of beer every 2 weeks  . Drug use: Not on file  . Sexual activity: Not Currently  Lifestyle  . Physical activity:    Days per week: Not on file    Minutes per session: Not on file  . Stress: Not on file  Relationships  . Social connections:    Talks on phone: Not on file    Gets together: Not on file    Attends religious service: Not on file    Active member of club or organization: Not on file     Attends meetings of clubs or organizations: Not on file    Relationship status: Not on file  . Intimate partner violence:    Fear of current or ex partner: Not on file    Emotionally abused: Not on file    Physically abused: Not on file    Forced sexual activity: Not on file  Other Topics Concern  . Not on file  Social History Narrative  . Not on file    Past Medical History:  Diagnosis Date  . Anxiety   . Asthma   . Depression   . Diabetes mellitus without complication [facility])      Patient Active Problem List   Diagnosis Date Noted  . Type 2 diabetes mellitus without complication, without long-term current use of insulin (HCC) 10/17/2015  . Carpal tunnel syndrome of left wrist 10/14/2015  . Bipolar depression (HCC) 07/12/2015  . 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 AB (asthmatic bronchitis) 08/02/2014  . Hypercholesteremia 08/02/2014  . History of suicidal ideation 08/02/2014  . Avitaminosis D 08/02/2014  . Generalized hyperhidrosis 08/02/2014  . Airway hyperreactivity 08/02/2014  . Anxiety disorder 08/02/2014  . Insomnia 08/02/2014  . Major depressive disorder, single episode 08/02/2014    Past Surgical History:  Procedure Laterality Date  . ABDOMINAL HYSTERECTOMY    . ARTHROPLASTY    . CHOLECYSTECTOMY    .  FEMUR FRACTURE SURGERY    . TOTAL KNEE ARTHROPLASTY Left     Her family history includes COPD in her mother; Coronary artery disease in her father and maternal grandmother; Diabetes in her father, mother, and paternal grandmother; Heart attack in her maternal grandmother.   Current Outpatient Medications:  .  albuterol (PROVENTIL HFA;VENTOLIN HFA) 108 (90 Base) MCG/ACT inhaler, Inhale 1-2 puffs into the lungs every 4 (four) hours as needed for wheezing or shortness of breath. Every 4-6 hours as needed, Disp: 1 Inhaler, Rfl: 11 .  aspirin 81 MG tablet, Take by mouth daily., Disp: , Rfl:  .  benztropine (COGENTIN) 0.5 MG tablet, Take 1 tablet (0.5 mg  total) by mouth 2 (two) times daily., Disp: 180 tablet, Rfl: 3 .  Calcium-Magnesium-Vitamin D (CALCIUM 500 PO), Take 1 tablet by mouth 2 (two) times daily., Disp: , Rfl:  .  divalproex (DEPAKOTE ER) 500 MG 24 hr tablet, Take 2 tablets (1,000 mg total) by mouth at bedtime., Disp: 180 tablet, Rfl: 3 .  fluticasone (FLONASE) 50 MCG/ACT nasal spray, Place 2 sprays into the nose daily., Disp: , Rfl:  .  glipiZIDE (GLUCOTROL XL) 10 MG 24 hr tablet, Take 1 tablet (10 mg total) by mouth daily with breakfast., Disp: 90 tablet, Rfl: 1 .  losartan (COZAAR) 25 MG tablet, TAKE 2 TABLETS BY MOUTH EVERY DAY, Disp: 180 tablet, Rfl: 1 .  metFORMIN (GLUCOPHAGE) 500 MG tablet, TAKE 1 TABLET (500 MG TOTAL) BY MOUTH 2 (TWO) TIMES DAILY WITH A MEAL., Disp: 180 tablet, Rfl: 1 .  montelukast (SINGULAIR) 10 MG tablet, Take 1 tablet (10 mg total) by mouth daily., Disp: 90 tablet, Rfl: 3 .  NON FORMULARY, Bladder Control Supplement Capsule, Disp: , Rfl:  .  umeclidinium bromide (INCRUSE ELLIPTA) 62.5 MCG/INH AEPB, Inhale 1 puff into the lungs daily., Disp: 30 each, Rfl: 11  Patient Care Team: Reine Just as PCP - General (Physician Assistant)    Objective:    Vitals: There were no vitals taken for this visit.  Physical Exam  Activities of Daily Living No flowsheet data found.  Fall Risk Assessment No flowsheet data found.   Depression Screen PHQ 2/9 Scores 02/28/2017 12/09/2014  PHQ - 2 Score 2 6  PHQ- 9 Score 7 24    No flowsheet data found.    Assessment & Plan:     Annual Wellness Visit  Reviewed patient's Family Medical History Reviewed and updated list of patient's medical providers Assessment of cognitive impairment was done Assessed patient's functional ability Established a written schedule for health screening services Health Risk Assessent Completed and Reviewed  Exercise Activities and Dietary recommendations Goals   None     Immunization History  Administered  Date(s) Administered  . Influenza,inj,Quad PF,6+ Mos 01/06/2018  . Pneumococcal Polysaccharide-23 07/30/2012  . Tdap 07/28/2009    Health Maintenance  Topic Date Due  . OPHTHALMOLOGY EXAM  05/04/1967  . HIV Screening  05/03/1972  . MAMMOGRAM  05/04/2007  . HEMOGLOBIN A1C  09/29/2018  . PAP SMEAR-Modifier  10/14/2018  . FOOT EXAM  01/07/2019  . TETANUS/TDAP  07/29/2019  . COLONOSCOPY  10/18/2026  . INFLUENZA VACCINE  Completed  . PNEUMOCOCCAL POLYSACCHARIDE VACCINE AGE 13-64 HIGH RISK  Completed  . Hepatitis C Screening  Completed     Discussed health benefits of physical activity, and encouraged her to engage in regular exercise appropriate for her age and condition.    ------------------------------------------------------------------------------------------------------------    Margaretann Loveless, PA-C  [facility]

## 2018-05-09 NOTE — Telephone Encounter (Signed)
Scheduled AWV for 05/13/18 @ 10:00 AM. -MM

## 2018-05-13 ENCOUNTER — Ambulatory Visit: Payer: Self-pay

## 2018-05-21 ENCOUNTER — Encounter: Payer: Self-pay | Admitting: Physician Assistant

## 2018-05-22 ENCOUNTER — Telehealth: Payer: Self-pay

## 2018-05-22 NOTE — Telephone Encounter (Signed)
Patient states she have a bad cough and chest tightness ( probably bronchitis per patient) . But  Patient denies any fever or other symptoms. She states that she have to have surgery in 3 weeks and needs to get it cleared up.

## 2018-05-23 ENCOUNTER — Ambulatory Visit (INDEPENDENT_AMBULATORY_CARE_PROVIDER_SITE_OTHER): Payer: Medicare Other | Admitting: Physician Assistant

## 2018-05-23 ENCOUNTER — Other Ambulatory Visit: Payer: Self-pay

## 2018-05-23 ENCOUNTER — Encounter: Payer: Self-pay | Admitting: Physician Assistant

## 2018-05-23 VITALS — BP 142/86 | HR 106 | Temp 98.1°F | Resp 16 | Wt 259.8 lb

## 2018-05-23 DIAGNOSIS — J4541 Moderate persistent asthma with (acute) exacerbation: Secondary | ICD-10-CM | POA: Diagnosis not present

## 2018-05-23 DIAGNOSIS — E119 Type 2 diabetes mellitus without complications: Secondary | ICD-10-CM

## 2018-05-23 LAB — POCT GLYCOSYLATED HEMOGLOBIN (HGB A1C)
Est. average glucose Bld gHb Est-mCnc: 148
Hemoglobin A1C: 6.8 % — AB (ref 4.0–5.6)

## 2018-05-23 MED ORDER — DOXYCYCLINE HYCLATE 100 MG PO TABS: 100.0000 mg | ORAL_TABLET | Freq: Two times a day (BID) | ORAL | 0 refills | Status: DC

## 2018-05-23 MED ORDER — PREDNISONE 20 MG PO TABS: 20.0000 mg | ORAL_TABLET | Freq: Every day | ORAL | 0 refills | Status: DC

## 2018-05-23 MED ORDER — HYDROCODONE-HOMATROPINE 5-1.5 MG/5ML PO SYRP: 5.0000 mL | ORAL_SOLUTION | Freq: Three times a day (TID) | ORAL | 0 refills | Status: DC | PRN

## 2018-05-23 NOTE — Telephone Encounter (Signed)
She needs appt. We need to also check her A1c before her surgery.

## 2018-05-23 NOTE — Progress Notes (Signed)
Patient: Joanna Bishop Female    DOB: [birth year]   63 y.o.   MRN: [id number] Visit Date: 05/23/2018  Today's Provider: Margaretann Loveless, PA-C   Chief Complaint  Patient presents with  . Diabetes   Subjective:    I, Sulibeya S. Dimas, CMA, am acting as a Neurosurgeon for [employer], PA-C.   HPI   Follow up for diabetes  The patient was last seen for this 1 months ago. Changes made at last visit include no changes.  She reports excellent compliance with treatment. She feels that condition is Unchanged. She is not having side effects.   Lab Results  Component Value Date   HGBA1C 6.8 (A) 05/23/2018   ------------------------------------------------------------------------------------  Upper Respiratory Infection: Patient complains of symptoms of a URI, possible sinusitis. Symptoms include congestion and cough. Onset of symptoms was 4 days ago, gradually worsening since that time. She also c/o congestion and cough described as productive of green sputum for the past 2 days .  She is drinking plenty of fluids. Evaluation to date: none. Treatment to date: none.      Allergies  Allergen Reactions  . Penicillins      Current Outpatient Medications:  .  albuterol (PROVENTIL HFA;VENTOLIN HFA) 108 (90 Base) MCG/ACT inhaler, Inhale 1-2 puffs into the lungs every 4 (four) hours as needed for wheezing or shortness of breath. Every 4-6 hours as needed, Disp: 1 Inhaler, Rfl: 11 .  aspirin 81 MG tablet, Take by mouth daily., Disp: , Rfl:  .  benztropine (COGENTIN) 0.5 MG tablet, Take 1 tablet (0.5 mg total) by mouth 2 (two) times daily., Disp: 180 tablet, Rfl: 3 .  Calcium-Magnesium-Vitamin D (CALCIUM 500 PO), Take 1 tablet by mouth 2 (two) times daily., Disp: , Rfl:  .  divalproex (DEPAKOTE ER) 500 MG 24 hr tablet, Take 2 tablets (1,000 mg total) by mouth at bedtime., Disp: 180 tablet, Rfl: 3 .  fluticasone (FLONASE) 50 MCG/ACT nasal spray, Place 2 sprays into the nose  daily., Disp: , Rfl:  .  glipiZIDE (GLUCOTROL XL) 10 MG 24 hr tablet, Take 1 tablet (10 mg total) by mouth daily with breakfast., Disp: 90 tablet, Rfl: 1 .  losartan (COZAAR) 25 MG tablet, TAKE 2 TABLETS BY MOUTH EVERY DAY, Disp: 180 tablet, Rfl: 1 .  metFORMIN (GLUCOPHAGE) 500 MG tablet, TAKE 1 TABLET (500 MG TOTAL) BY MOUTH 2 (TWO) TIMES DAILY WITH A MEAL., Disp: 180 tablet, Rfl: 1 .  montelukast (SINGULAIR) 10 MG tablet, Take 1 tablet (10 mg total) by mouth daily., Disp: 90 tablet, Rfl: 3 .  NON FORMULARY, Bladder Control Supplement Capsule, Disp: , Rfl:  .  umeclidinium bromide (INCRUSE ELLIPTA) 62.5 MCG/INH AEPB, Inhale 1 puff into the lungs daily., Disp: 30 each, Rfl: 11  Review of Systems  Constitutional: Negative.  Negative for fever.  HENT: Positive for congestion, postnasal drip and sinus pressure. Negative for ear pain, rhinorrhea, sinus pain, sneezing and sore throat.   Respiratory: Positive for cough, shortness of breath and wheezing. Negative for chest tightness.   Cardiovascular: Negative.   Gastrointestinal: Negative for abdominal pain and nausea.  Neurological: Negative.     Social History   Tobacco Use  . Smoking status: Never Smoker  . Smokeless tobacco: Never Used  Substance Use Topics  . Alcohol use: Yes    Alcohol/week: 0.0 standard drinks    Comment: 1/2 ca of beer every 2 weeks      Objective:  BP (!) 142/86 (BP Location: Left Arm, Patient Position: Sitting, Cuff Size: Large)   Pulse (!) 106   Temp 98.1 F (36.7 C) (Oral)   Resp 16   Wt 259 lb 12.8 oz (117.8 kg)   SpO2 96%   BMI 43.23 kg/m  Vitals:   05/23/18 1430  BP: (!) 142/86  Pulse: (!) 106  Resp: 16  Temp: 98.1 F (36.7 C)  TempSrc: Oral  SpO2: 96%  Weight: 259 lb 12.8 oz (117.8 kg)     Physical Exam Vitals signs reviewed.  Constitutional:      General: She is not in acute distress.    Appearance: She is well-developed. She is obese. She is not ill-appearing or diaphoretic.   HENT:     Head: Normocephalic and atraumatic.     Right Ear: Hearing, tympanic membrane, ear canal and external ear normal.     Left Ear: Hearing, tympanic membrane, ear canal and external ear normal.     Nose: Congestion and rhinorrhea present.     Mouth/Throat:     Mouth: Mucous membranes are moist.     Pharynx: Uvula midline. Posterior oropharyngeal erythema present. No oropharyngeal exudate.  Eyes:     General: No scleral icterus.       Right eye: No discharge.        Left eye: No discharge.     Conjunctiva/sclera: Conjunctivae normal.     Pupils: Pupils are equal, round, and reactive to light.  Neck:     Musculoskeletal: Normal range of motion and neck supple.     Thyroid: No thyromegaly.     Trachea: No tracheal deviation.  Cardiovascular:     Rate and Rhythm: Normal rate and regular rhythm.     Heart sounds: Normal heart sounds. No murmur. No friction rub. No gallop.   Pulmonary:     Effort: Pulmonary effort is normal. No respiratory distress.     Breath sounds: No stridor. Wheezing (very fine expiratory wheezes) present. No rales.  Lymphadenopathy:     Cervical: No cervical adenopathy.  Skin:    General: Skin is warm and dry.  Neurological:     Mental Status: She is alert.         Assessment & Plan    1. Type 2 diabetes mellitus without complication, without long-term current use of insulin (HCC) A1c improved to 6.8. Clearance form for [facility], Dr. Odis Luster signed and sent with newest A1c reading. I will see her back in 3-5 months, pending surgical response and recovery.  - POCT glycosylated hemoglobin (Hb A1C)  2. Bronchitis, asthmatic, moderate persistent, with acute exacerbation Worsening symptoms that have not responded to OTC medications. Will give Doxycycline and prednisone as below. Will give Tussionex cough syrup as below for nighttime cough. Drowsiness precautions given to patient. Stay well hydrated. Use delsym, robitussin OR mucinex for daytime  cough.Continue allergy medications. Stay well hydrated and get plenty of rest. Call if no symptom improvement or if symptoms worsen. - doxycycline (VIBRA-TABS) 100 MG tablet; Take 1 tablet (100 mg total) by mouth 2 (two) times daily.  Dispense: 20 tablet; Refill: 0 - predniSONE (DELTASONE) 20 MG tablet; Take 1 tablet (20 mg total) by mouth daily with breakfast.  Dispense: 7 tablet; Refill: 0 - HYDROcodone-homatropine (HYCODAN) 5-1.5 MG/5ML syrup; Take 5 mLs by mouth every 8 (eight) hours as needed for cough.  Dispense: 120 mL; Refill: 0     Margaretann Loveless, PA-C  [facility]

## 2018-06-13 DIAGNOSIS — Z7982 Long term (current) use of aspirin: Secondary | ICD-10-CM | POA: Diagnosis not present

## 2018-06-13 DIAGNOSIS — F329 Major depressive disorder, single episode, unspecified: Secondary | ICD-10-CM | POA: Diagnosis present

## 2018-06-13 DIAGNOSIS — Z6841 Body Mass Index (BMI) 40.0 and over, adult: Secondary | ICD-10-CM | POA: Diagnosis not present

## 2018-06-13 DIAGNOSIS — G8918 Other acute postprocedural pain: Secondary | ICD-10-CM | POA: Diagnosis not present

## 2018-06-13 DIAGNOSIS — Z7984 Long term (current) use of oral hypoglycemic drugs: Secondary | ICD-10-CM | POA: Diagnosis not present

## 2018-06-13 DIAGNOSIS — J45909 Unspecified asthma, uncomplicated: Secondary | ICD-10-CM | POA: Diagnosis present

## 2018-06-13 DIAGNOSIS — M25561 Pain in right knee: Secondary | ICD-10-CM | POA: Diagnosis not present

## 2018-06-13 DIAGNOSIS — F419 Anxiety disorder, unspecified: Secondary | ICD-10-CM | POA: Diagnosis present

## 2018-06-13 DIAGNOSIS — E119 Type 2 diabetes mellitus without complications: Secondary | ICD-10-CM | POA: Diagnosis present

## 2018-06-13 DIAGNOSIS — M810 Age-related osteoporosis without current pathological fracture: Secondary | ICD-10-CM | POA: Diagnosis present

## 2018-06-13 DIAGNOSIS — M1711 Unilateral primary osteoarthritis, right knee: Secondary | ICD-10-CM | POA: Diagnosis not present

## 2018-06-17 ENCOUNTER — Telehealth: Payer: Self-pay

## 2018-06-17 DIAGNOSIS — J40 Bronchitis, not specified as acute or chronic: Secondary | ICD-10-CM

## 2018-06-17 NOTE — Telephone Encounter (Signed)
Patient called to inform physician that she just had surgery and her cough has returned, patient states that physician is aware of her condition and is asking for a antibiotic to be called in. When I asked patient if she had tried a otc cough suppressant she stated that she has. Patient uses [facility] on [address] Joanna Bishop

## 2018-06-18 DIAGNOSIS — M25561 Pain in right knee: Secondary | ICD-10-CM | POA: Diagnosis not present

## 2018-06-18 DIAGNOSIS — M25661 Stiffness of right knee, not elsewhere classified: Secondary | ICD-10-CM | POA: Insufficient documentation

## 2018-06-18 MED ORDER — AZITHROMYCIN 250 MG PO TABS: ORAL_TABLET | ORAL | 0 refills | Status: DC

## 2018-06-18 NOTE — Telephone Encounter (Signed)
Patient was advised.  

## 2018-06-18 NOTE — Telephone Encounter (Signed)
Zpak sent in but if she does not get better, or if she gets worse she needs to be seen. There are a lot of issues that can happen following surgery that can cause a cough.

## 2018-06-18 NOTE — Telephone Encounter (Signed)
Please advise 

## 2018-07-07 ENCOUNTER — Other Ambulatory Visit (HOSPITAL_COMMUNITY)
Admission: RE | Admit: 2018-07-07 | Discharge: 2018-07-07 | Disposition: A | Discharge status: Home / Self Care | Payer: Medicare Other | Source: Ambulatory Visit | Attending: Physician Assistant | Admitting: Physician Assistant

## 2018-07-07 ENCOUNTER — Encounter: Payer: Self-pay | Admitting: Physician Assistant

## 2018-07-07 ENCOUNTER — Other Ambulatory Visit: Payer: Self-pay

## 2018-07-07 ENCOUNTER — Ambulatory Visit (INDEPENDENT_AMBULATORY_CARE_PROVIDER_SITE_OTHER): Payer: Medicare Other | Admitting: Physician Assistant

## 2018-07-07 VITALS — BP 137/97 | HR 103 | Temp 97.7°F | Resp 16 | Ht 65.0 in | Wt 250.0 lb

## 2018-07-07 DIAGNOSIS — Z0000 Encounter for general adult medical examination without abnormal findings: Secondary | ICD-10-CM

## 2018-07-07 DIAGNOSIS — Z1151 Encounter for screening for human papillomavirus (HPV): Secondary | ICD-10-CM | POA: Diagnosis not present

## 2018-07-07 DIAGNOSIS — Z124 Encounter for screening for malignant neoplasm of cervix: Secondary | ICD-10-CM

## 2018-07-07 DIAGNOSIS — Z1239 Encounter for other screening for malignant neoplasm of breast: Secondary | ICD-10-CM | POA: Diagnosis not present

## 2018-07-07 NOTE — Patient Instructions (Signed)

## 2018-07-07 NOTE — Progress Notes (Signed)
Patient: Joanna Bishop, Female    DOB: [birth year], 63 y.o.   MRN: [id number] Visit Date: 07/07/2018  Today's Provider: Margaretann Loveless, PA-C   Chief Complaint  Patient presents with  . Medicare Wellness   Subjective:     Initial Welcome to Medicare Joanna Bishop is a 63 y.o. female who presents today for health maintenance and complete physical. She feels fairly well. She reports exercising some/PT. She reports she is sleeping poorly. ---------------------------------------------------------------   Review of Systems  Constitutional: Positive for appetite change (eating less since her knee surgery, thinks it is because she is more active and not taking pain meds).  HENT: Negative.   Eyes: Negative.   Respiratory: Positive for cough.   Cardiovascular: Positive for leg swelling (s/p right TKR).  Gastrointestinal: Positive for abdominal pain (RLQ on Friday, since improved).  Endocrine: Negative.   Genitourinary: Positive for enuresis and frequency.  Musculoskeletal: Positive for arthralgias and joint swelling.  Skin: Negative.   Allergic/Immunologic: Negative.   Neurological: Positive for headaches. Negative for dizziness, light-headedness and numbness.  Hematological: Negative.   Psychiatric/Behavioral: Positive for decreased concentration.    Social History      She  reports that she has never smoked. She has never used smokeless tobacco. She reports current alcohol use.       Social History   Socioeconomic History  . Marital status: Married    Spouse name: Not on file  . Number of children: Not on file  . Years of education: Not on file  . Highest education level: Not on file  Occupational History  . Not on file  Social Needs  . Financial resource strain: Not on file  . Food insecurity:    Worry: Not on file    Inability: Not on file  . Transportation needs:    Medical: Not on file    Non-medical: Not on file  Tobacco Use  . Smoking status: Never  Smoker  . Smokeless tobacco: Never Used  Substance and Sexual Activity  . Alcohol use: Yes    Alcohol/week: 0.0 standard drinks    Comment: 1/2 ca of beer every 2 weeks  . Drug use: Not on file  . Sexual activity: Not Currently  Lifestyle  . Physical activity:    Days per week: Not on file    Minutes per session: Not on file  . Stress: Not on file  Relationships  . Social connections:    Talks on phone: Not on file    Gets together: Not on file    Attends religious service: Not on file    Active member of club or organization: Not on file    Attends meetings of clubs or organizations: Not on file    Relationship status: Not on file  Other Topics Concern  . Not on file  Social History Narrative  . Not on file    Past Medical History:  Diagnosis Date  . Anxiety   . Asthma   . Depression   . Diabetes mellitus without complication [facility])      Patient Active Problem List   Diagnosis Date Noted  . Type 2 diabetes mellitus without complication, without long-term current use of insulin (HCC) 10/17/2015  . Carpal tunnel syndrome of left wrist 10/14/2015  . Bipolar depression (HCC) 07/12/2015  . 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 AB (asthmatic bronchitis) 08/02/2014  . Hypercholesteremia 08/02/2014  . History of suicidal ideation 08/02/2014  . Avitaminosis  D 08/02/2014  . Generalized hyperhidrosis 08/02/2014  . Airway hyperreactivity 08/02/2014  . Anxiety disorder 08/02/2014  . Insomnia 08/02/2014  . Major depressive disorder, single episode 08/02/2014    Past Surgical History:  Procedure Laterality Date  . ABDOMINAL HYSTERECTOMY    . ARTHROPLASTY    . CHOLECYSTECTOMY    . FEMUR FRACTURE SURGERY    . TOTAL KNEE ARTHROPLASTY Left     Family History        Family Status  Relation Name Status  . Mother  Deceased  . Father  Deceased  . MGM  Deceased  . PGM  Deceased        Her family history includes COPD in her mother; Coronary artery disease in her  father and maternal grandmother; Diabetes in her father, mother, and paternal grandmother; Heart attack in her maternal grandmother.      Allergies  Allergen Reactions  . Penicillins      Current Outpatient Medications:  .  albuterol (PROVENTIL HFA;VENTOLIN HFA) 108 (90 Base) MCG/ACT inhaler, Inhale 1-2 puffs into the lungs every 4 (four) hours as needed for wheezing or shortness of breath. Every 4-6 hours as needed, Disp: 1 Inhaler, Rfl: 11 .  aspirin 81 MG tablet, Take by mouth daily., Disp: , Rfl:  .  benztropine (COGENTIN) 0.5 MG tablet, Take 1 tablet (0.5 mg total) by mouth 2 (two) times daily., Disp: 180 tablet, Rfl: 3 .  Calcium-Magnesium-Vitamin D (CALCIUM 500 PO), Take 1 tablet by mouth 2 (two) times daily., Disp: , Rfl:  .  divalproex (DEPAKOTE ER) 500 MG 24 hr tablet, Take 2 tablets (1,000 mg total) by mouth at bedtime., Disp: 180 tablet, Rfl: 3 .  fluticasone (FLONASE) 50 MCG/ACT nasal spray, Place 2 sprays into the nose daily., Disp: , Rfl:  .  glipiZIDE (GLUCOTROL XL) 10 MG 24 hr tablet, Take 1 tablet (10 mg total) by mouth daily with breakfast., Disp: 90 tablet, Rfl: 1 .  losartan (COZAAR) 25 MG tablet, TAKE 2 TABLETS BY MOUTH EVERY DAY, Disp: 180 tablet, Rfl: 1 .  metFORMIN (GLUCOPHAGE) 500 MG tablet, TAKE 1 TABLET (500 MG TOTAL) BY MOUTH 2 (TWO) TIMES DAILY WITH A MEAL., Disp: 180 tablet, Rfl: 1 .  montelukast (SINGULAIR) 10 MG tablet, Take 1 tablet (10 mg total) by mouth daily., Disp: 90 tablet, Rfl: 3 .  NON FORMULARY, Bladder Control Supplement Capsule, Disp: , Rfl:  .  umeclidinium bromide (INCRUSE ELLIPTA) 62.5 MCG/INH AEPB, Inhale 1 puff into the lungs daily., Disp: 30 each, Rfl: 11 .  azithromycin (ZITHROMAX) 250 MG tablet, Take 2 tablets PO on day one, and one tablet PO daily thereafter until completed. (Patient not taking: Reported on 07/07/2018), Disp: 6 tablet, Rfl: 0 .  doxycycline (VIBRA-TABS) 100 MG tablet, Take 1 tablet (100 mg total) by mouth 2 (two) times  daily. (Patient not taking: Reported on 07/07/2018), Disp: 20 tablet, Rfl: 0 .  HYDROcodone-homatropine (HYCODAN) 5-1.5 MG/5ML syrup, Take 5 mLs by mouth every 8 (eight) hours as needed for cough. (Patient not taking: Reported on 07/07/2018), Disp: 120 mL, Rfl: 0 .  predniSONE (DELTASONE) 20 MG tablet, Take 1 tablet (20 mg total) by mouth daily with breakfast. (Patient not taking: Reported on 07/07/2018), Disp: 7 tablet, Rfl: 0   Patient Care Team: Reine Just as PCP - General (Physician Assistant)    Objective:    Vitals: BP (!) 137/97 (BP Location: Left Arm, Patient Position: Sitting, Cuff Size: Large)   Pulse (!) 103  Temp 97.7 F (36.5 C) (Oral)   Resp 16   Ht  (1.651 m)   Wt 250 lb (113.4 kg)   SpO2 97%   BMI 41.60 kg/m    Vitals:   07/07/18 1002  BP: (!) 137/97  Pulse: (!) 103  Resp: 16  Temp: 97.7 F (36.5 C)  TempSrc: Oral  SpO2: 97%  Weight: 250 lb (113.4 kg)  Height:  (1.651 m)     Physical Exam Vitals signs reviewed.  Constitutional:      General: She is not in acute distress.    Appearance: Normal appearance. She is well-developed. She is obese. She is not ill-appearing or diaphoretic.  HENT:     Head: Normocephalic and atraumatic.     Right Ear: Hearing, tympanic membrane, ear canal and external ear normal.     Left Ear: Hearing, tympanic membrane, ear canal and external ear normal.     Nose: Nose normal.     Mouth/Throat:     Mouth: Mucous membranes are moist.     Pharynx: Uvula midline. No oropharyngeal exudate.  Eyes:     General: No scleral icterus.       Right eye: No discharge.        Left eye: No discharge.     Extraocular Movements: Extraocular movements intact.     Conjunctiva/sclera: Conjunctivae normal.     Pupils: Pupils are equal, round, and reactive to light.  Neck:     Musculoskeletal: Normal range of motion and neck supple.     Thyroid: No thyromegaly.     Vascular: No carotid bruit or JVD.     Trachea: No  tracheal deviation.  Cardiovascular:     Rate and Rhythm: Normal rate and regular rhythm.     Pulses: Normal pulses.     Heart sounds: Normal heart sounds. No murmur. No friction rub. No gallop.   Pulmonary:     Effort: Pulmonary effort is normal. No respiratory distress.     Breath sounds: Normal breath sounds. No wheezing or rales.  Chest:     Chest wall: No tenderness.     Breasts: Breasts are symmetrical.        Right: No inverted nipple, mass, nipple discharge, skin change or tenderness.        Left: No inverted nipple, mass, nipple discharge, skin change or tenderness.  Abdominal:     General: Bowel sounds are normal. There is no distension.     Palpations: Abdomen is soft. There is no mass.     Tenderness: There is no abdominal tenderness. There is no guarding or rebound.     Hernia: There is no hernia in the right inguinal area or left inguinal area.  Genitourinary:    Exam position: Supine.     Labia:        Right: No rash, tenderness, lesion or injury.        Left: No rash, tenderness, lesion or injury.      Vagina: Normal. No signs of injury. No vaginal discharge, erythema, tenderness or bleeding.     Cervix: No cervical motion tenderness, discharge or friability.     Adnexa:        Right: No mass, tenderness or fullness.         Left: No mass, tenderness or fullness.       Rectum: Normal.  Musculoskeletal: Normal range of motion.        General: No tenderness.     Comments:  Well healing surgical incision over right knee; s/p right TKR  Lymphadenopathy:     Cervical: No cervical adenopathy.  Skin:    General: Skin is warm and dry.     Capillary Refill: Capillary refill takes less than 2 seconds.     Findings: No rash.  Neurological:     General: No focal deficit present.     Mental Status: She is alert and oriented to person, place, and time. Mental status is at baseline.     Cranial Nerves: No cranial nerve deficit.     Coordination: Coordination normal.      Deep Tendon Reflexes: Reflexes are normal and symmetric.  Psychiatric:        Mood and Affect: Mood normal.        Behavior: Behavior normal.        Thought Content: Thought content normal.        Judgment: Judgment normal.      Depression Screen PHQ 2/9 Scores 07/07/2018 02/28/2017 12/09/2014  PHQ - 2 Score 2 2 6   PHQ- 9 Score 10 7 24    Cognitive Testing - 6-CIT  Correct? Score   What year is it? yes 0 0 or 4  What month is it? yes 0 0 or 3  Memorize:    Floyde Parkins[place],  [place],  44,  [address],  [place],      What time is it? (within 1 hour) yes 0 0 or 3  Count backwards from 20 yes 0 0, 2, or 4  Name the months of the year yes 0 0, 2, or 4  Repeat name & address above yes 0 0, 2, 4, 6, 8, or 10       TOTAL SCORE  0/28   Interpretation:  Normal  Normal (0-7) Abnormal (8-28)      Assessment & Plan:     Routine Health Maintenance and Physical Exam  Exercise Activities and Dietary recommendations Goals   None     Immunization History  Administered Date(s) Administered  . Influenza,inj,Quad PF,6+ Mos 01/06/2018  . Pneumococcal Polysaccharide-23 07/30/2012  . Tdap 07/28/2009    Health Maintenance  Topic Date Due  . OPHTHALMOLOGY EXAM  05/04/1967  . HIV Screening  05/03/1972  . MAMMOGRAM  05/04/2007  . INFLUENZA VACCINE  09/27/2018  . PAP SMEAR-Modifier  10/14/2018  . HEMOGLOBIN A1C  11/23/2018  . FOOT EXAM  01/07/2019  . TETANUS/TDAP  07/29/2019  . COLONOSCOPY  10/18/2026  . PNEUMOCOCCAL POLYSACCHARIDE VACCINE AGE 61-64 HIGH RISK  Completed  . Hepatitis C Screening  Completed     Discussed health benefits of physical activity, and encouraged her to engage in regular exercise appropriate for her age and condition.    1. Welcome to Medicare preventive visit Up to date on screenings and vaccinations. EKG today showed NSR rate of 93. I will see her back in 3-6 months for f/u labs and T2DM. - EKG 12-Lead  2. Breast cancer screening Breast exam today was normal. There  is no family history of breast cancer. She does perform regular self breast exams. Mammogram was ordered as below. [facility] was given to patient so she may schedule her mammogram at her convenience. - MM 3D SCREEN BREAST BILATERAL; Future  3. Cervical cancer screening Pap collected today. Will send as below and f/u pending results. - Cytology - PAP  --------------------------------------------------------------------    Margaretann LovelessJennifer M Sharonann Malbrough, PA-C  [facility]

## 2018-07-09 LAB — CYTOLOGY - PAP
Diagnosis: NEGATIVE
HPV: NOT DETECTED

## 2018-07-10 ENCOUNTER — Other Ambulatory Visit: Payer: Self-pay | Admitting: Physician Assistant

## 2018-07-10 ENCOUNTER — Telehealth: Payer: Self-pay

## 2018-07-10 DIAGNOSIS — B379 Candidiasis, unspecified: Secondary | ICD-10-CM

## 2018-07-10 MED ORDER — FLUCONAZOLE 150 MG PO TABS: 150.0000 mg | ORAL_TABLET | Freq: Once | ORAL | 0 refills | Status: AC

## 2018-07-10 NOTE — Telephone Encounter (Signed)
-----   Message from Margaretann Loveless, [place] sent at 07/10/2018 12:15 PM EDT ----- Pap is normal, HPV negative.  Will repeat in 3-5 years. There is yeast present. Will send in diflucan.

## 2018-07-10 NOTE — Telephone Encounter (Signed)
Patient advised as directed below. 

## 2018-07-10 NOTE — Progress Notes (Signed)
Yeast present on pap. Diflucan sent in.

## 2018-07-16 ENCOUNTER — Other Ambulatory Visit: Payer: Self-pay | Admitting: Physician Assistant

## 2018-07-16 DIAGNOSIS — E119 Type 2 diabetes mellitus without complications: Secondary | ICD-10-CM

## 2018-07-29 ENCOUNTER — Other Ambulatory Visit: Payer: Self-pay

## 2018-07-29 DIAGNOSIS — Z96659 Presence of unspecified artificial knee joint: Secondary | ICD-10-CM | POA: Diagnosis not present

## 2018-07-29 DIAGNOSIS — E119 Type 2 diabetes mellitus without complications: Secondary | ICD-10-CM

## 2018-07-29 MED ORDER — METFORMIN HCL 500 MG PO TABS: 500.0000 mg | ORAL_TABLET | Freq: Two times a day (BID) | ORAL | 1 refills | Status: DC

## 2018-07-29 NOTE — Telephone Encounter (Signed)
Patient contacted office requesting refill of Metformin sent to [facility] [address] [facility]

## 2018-08-11 IMAGING — DX DG KNEE 1-2V*R*
2 series · 2 of 2 positions shown · non-contrast
Comparison: None.

CLINICAL DATA: Right leg pain and swelling since [REDACTED]

EXAM:
RIGHT KNEE - 1-2 VIEW

[knee ap]
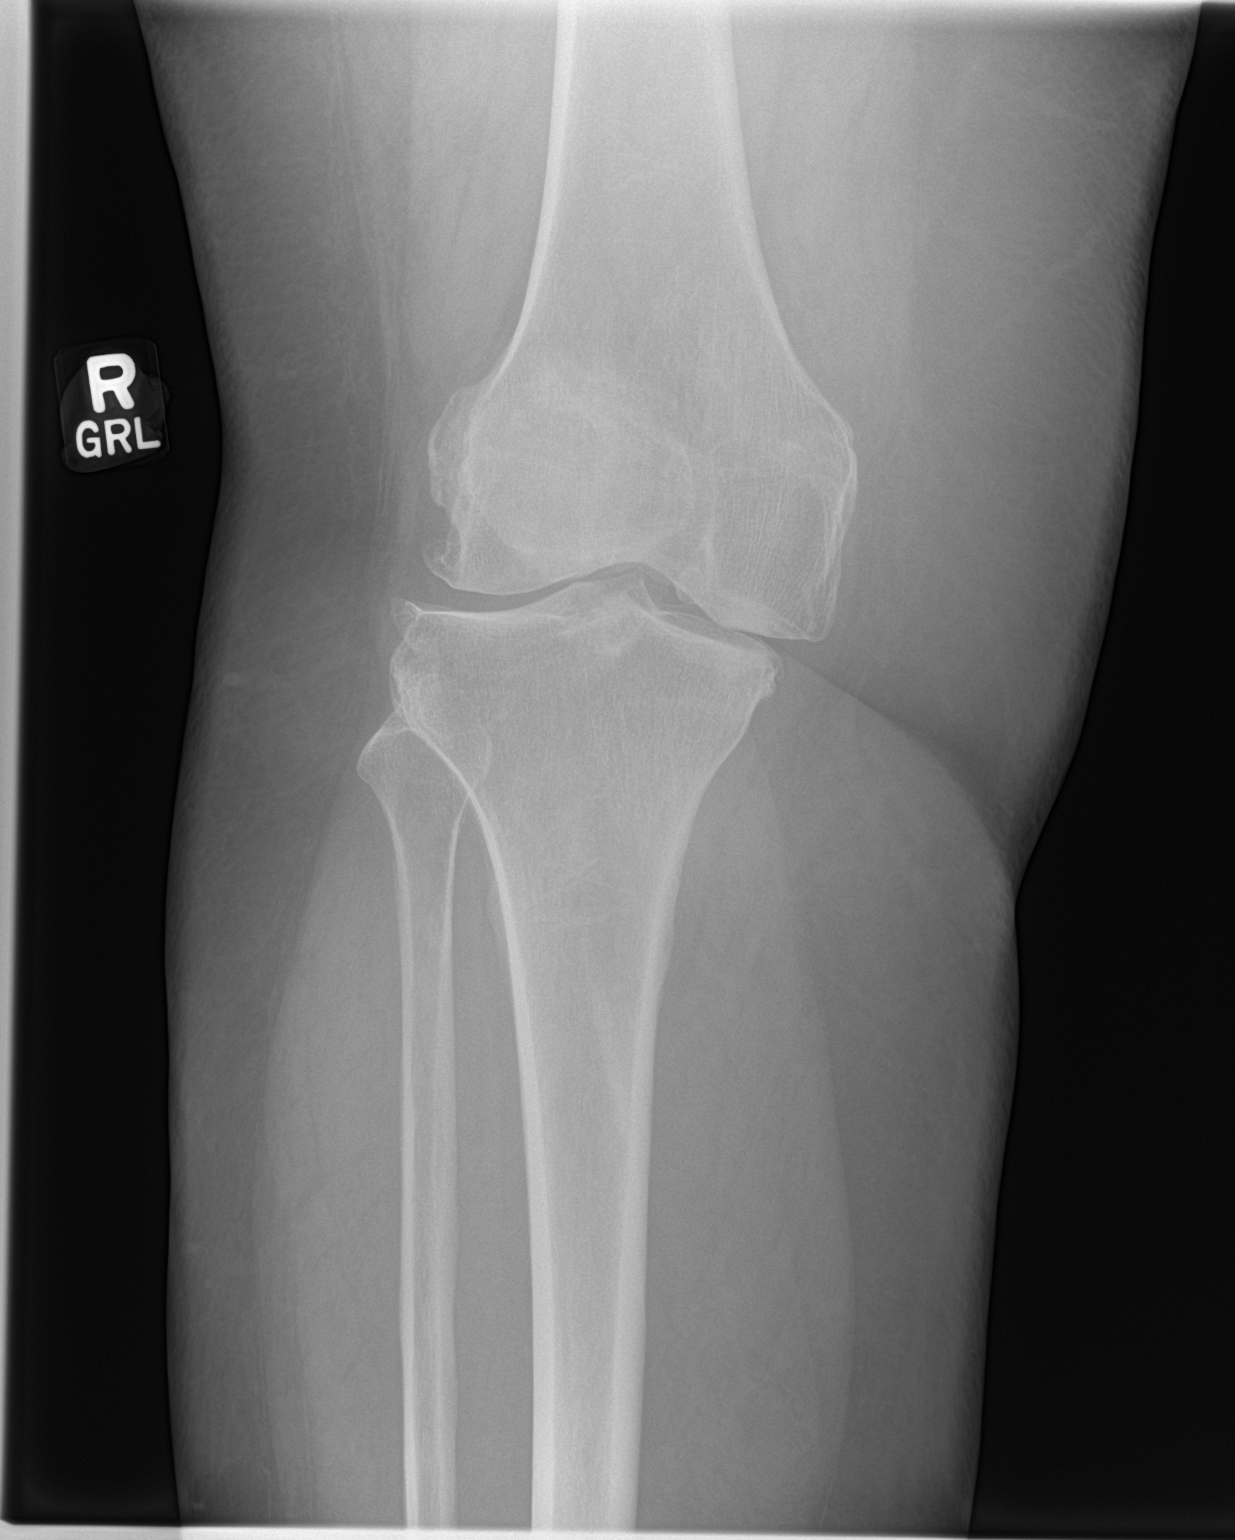

[knee lat]
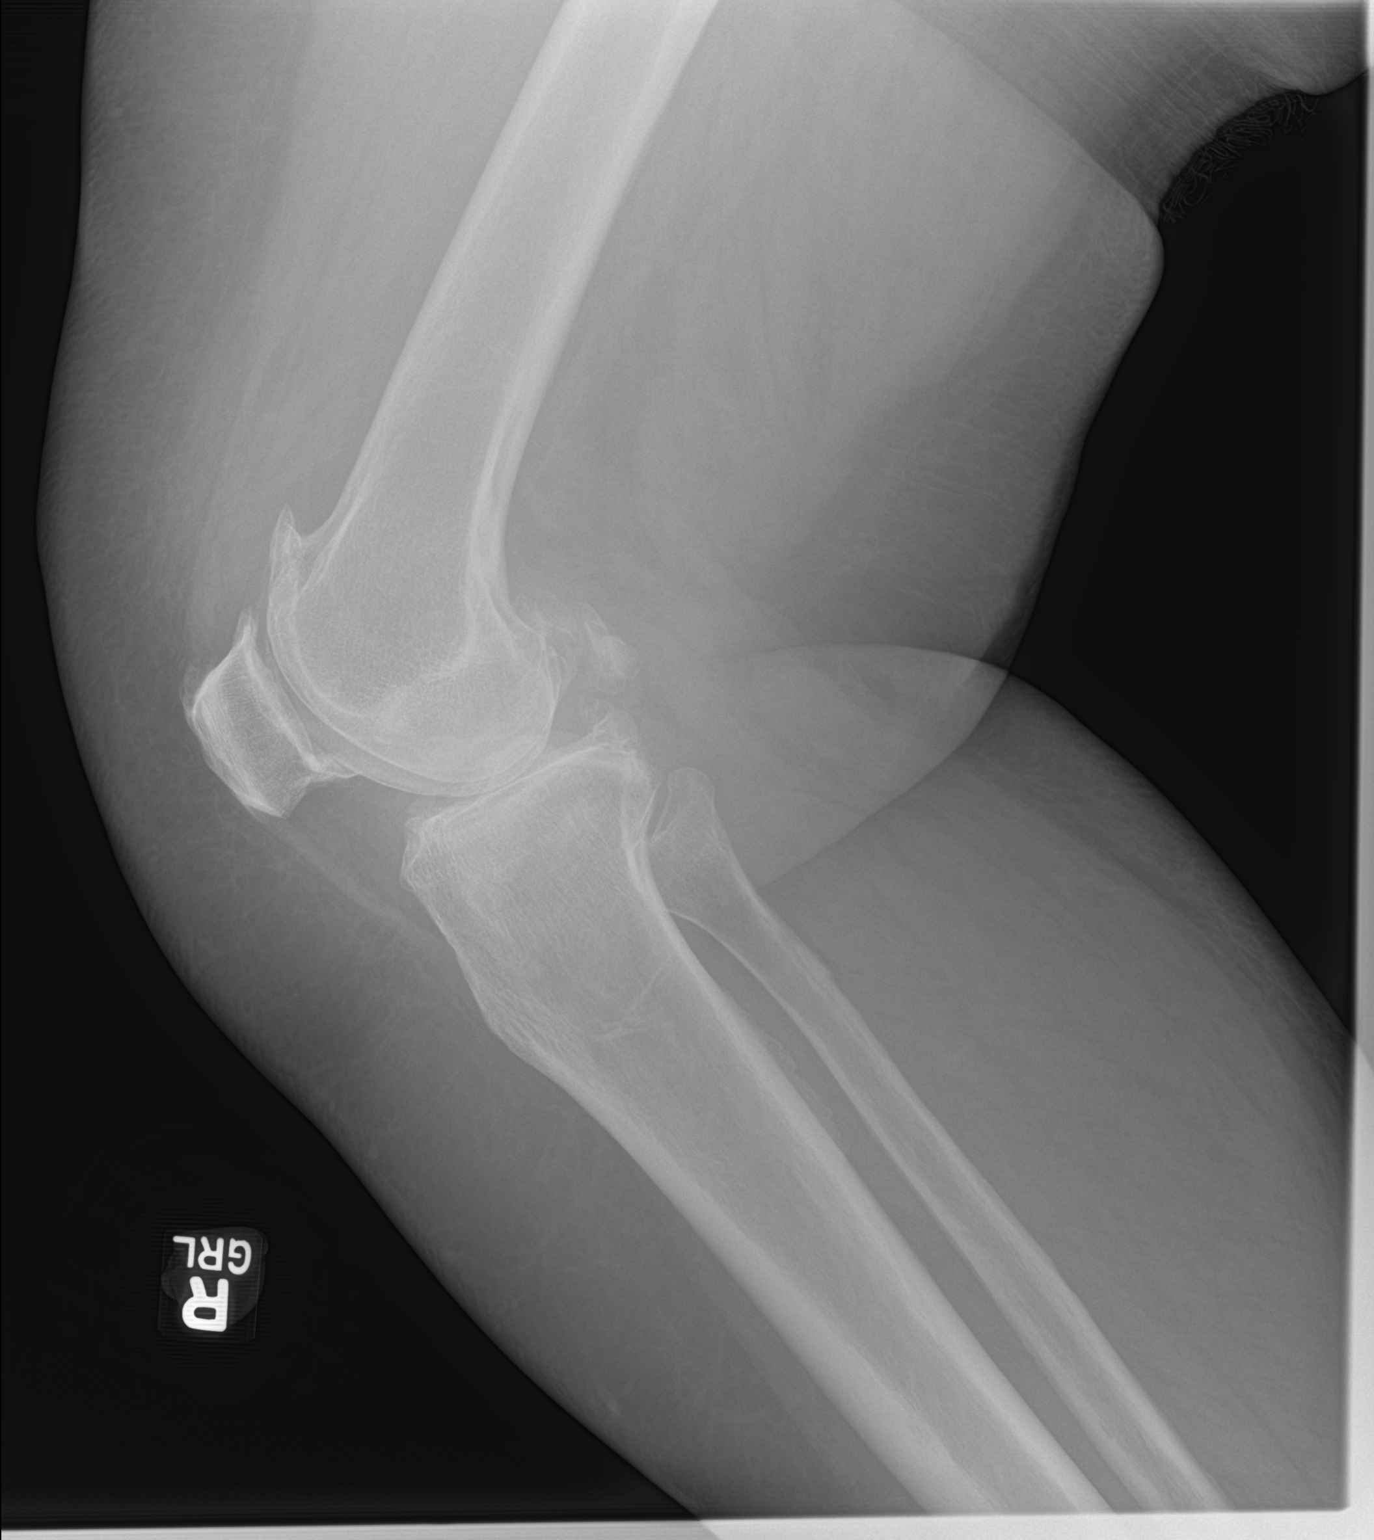

[2 of 2 positions shown; findings below may reference images not displayed]

FINDINGS: No fracture or dislocation is seen.

Moderate tricompartmental degenerative changes, most prominent in
the patellofemoral compartment.

The visualized soft tissues are unremarkable.

No definite suprapatellar knee joint effusion.
IMPRESSION: No acute osseus abnormality is seen.

Moderate degenerative changes.

## 2018-08-22 ENCOUNTER — Other Ambulatory Visit: Payer: Self-pay | Admitting: Physician Assistant

## 2018-08-22 DIAGNOSIS — E119 Type 2 diabetes mellitus without complications: Secondary | ICD-10-CM

## 2018-08-22 MED ORDER — GLIPIZIDE ER 10 MG PO TB24: 10.0000 mg | ORAL_TABLET | Freq: Every day | ORAL | 1 refills | Status: DC

## 2018-08-22 NOTE — Telephone Encounter (Signed)
Pt requesting a refill on:  glipiZIDE (GLUCOTROL XL) 10 MG 24 hr tablet  Please fill at:  [id number] [place], [place] - [address] [phone] (Phone) [phone] (Fax)    Thanks, [employer]

## 2018-11-02 ENCOUNTER — Other Ambulatory Visit: Payer: Self-pay | Admitting: Physician Assistant

## 2018-11-02 DIAGNOSIS — I10 Essential (primary) hypertension: Secondary | ICD-10-CM

## 2018-11-02 DIAGNOSIS — E1129 Type 2 diabetes mellitus with other diabetic kidney complication: Secondary | ICD-10-CM

## 2018-11-02 DIAGNOSIS — Z794 Long term (current) use of insulin: Secondary | ICD-10-CM

## 2018-12-04 NOTE — Progress Notes (Signed)
Patient: Joanna Bishop Female    DOB: October 11, 1955   63 y.o.   MRN: [id number] Visit Date: 12/05/2018  Today's Provider: Margaretann Loveless, PA-C   Chief Complaint  Patient presents with  . Cough  . Back Pain   Subjective:    I,Joanna Bishop,RMA am acting as a Neurosurgeon for [employer], PA-C.  Virtual Visit via Video Note  I connected with Joanna Bishop on 12/05/18 at 10:20 AM EDT by a video enabled telemedicine application and verified that I am speaking with the correct person using two identifiers.  Location: Patient: Home Provider: Home office   I discussed the limitations of evaluation and management by telemedicine and the availability of in person appointments. The patient expressed understanding and agreed to proceed.   HPI  Patient with c/o cough. Reports it started on Monday. Associated symptoms:chest tightness/heavy. No other symptoms. No fever. Reports this feels similar to her previous bronchitis episodes. Thinks it may be from the change of seasons.   Back Pain: Lower back pain started on Tuesday. Reports no injury.  No hematuria or Dysuria. Reports urine is coudy and has an odor. Thinks she has another UTI.    Allergies  Allergen Reactions  . Penicillins      Current Outpatient Medications:  .  albuterol (PROVENTIL HFA;VENTOLIN HFA) 108 (90 Base) MCG/ACT inhaler, Inhale 1-2 puffs into the lungs every 4 (four) hours as needed for wheezing or shortness of breath. Every 4-6 hours as needed, Disp: 1 Inhaler, Rfl: 11 .  aspirin 81 MG tablet, Take by mouth daily., Disp: , Rfl:  .  benztropine (COGENTIN) 0.5 MG tablet, Take 1 tablet (0.5 mg total) by mouth 2 (two) times daily., Disp: 180 tablet, Rfl: 3 .  Calcium-Magnesium-Vitamin D (CALCIUM 500 PO), Take 1 tablet by mouth 2 (two) times daily., Disp: , Rfl:  .  divalproex (DEPAKOTE ER) 500 MG 24 hr tablet, Take 2 tablets (1,000 mg total) by mouth at bedtime., Disp: 180 tablet, Rfl: 3 .  losartan  (COZAAR) 25 MG tablet, TAKE 2 TABLETS BY MOUTH EVERY DAY, Disp: 180 tablet, Rfl: 1 .  metFORMIN (GLUCOPHAGE) 500 MG tablet, Take 1 tablet (500 mg total) by mouth 2 (two) times daily with a meal., Disp: 180 tablet, Rfl: 1 .  montelukast (SINGULAIR) 10 MG tablet, Take 1 tablet (10 mg total) by mouth daily., Disp: 90 tablet, Rfl: 3 .  NON FORMULARY, Bladder Control Supplement Capsule, Disp: , Rfl:  .  umeclidinium bromide (INCRUSE ELLIPTA) 62.5 MCG/INH AEPB, Inhale 1 puff into the lungs daily., Disp: 30 each, Rfl: 11 .  fluticasone (FLONASE) 50 MCG/ACT nasal spray, Place 2 sprays into the nose daily., Disp: , Rfl:  .  glipiZIDE (GLUCOTROL XL) 10 MG 24 hr tablet, Take 1 tablet (10 mg total) by mouth daily with breakfast. (Patient not taking: Reported on 12/05/2018), Disp: 90 tablet, Rfl: 1 .  HYDROcodone-homatropine (HYCODAN) 5-1.5 MG/5ML syrup, Take 5 mLs by mouth every 8 (eight) hours as needed for cough. (Patient not taking: Reported on 07/07/2018), Disp: 120 mL, Rfl: 0  Review of Systems  Constitutional: Negative for fever.  HENT: Negative for congestion, ear pain, postnasal drip, rhinorrhea, sinus pressure, sinus pain and sore throat.   Respiratory: Positive for cough, chest tightness and wheezing. Negative for shortness of breath.   Gastrointestinal: Negative for abdominal pain and nausea.  Genitourinary:       Cloudy urine with odor  Musculoskeletal: Positive for back  pain.  Neurological: Negative for headaches.    Social History   Tobacco Use  . Smoking status: Never Smoker  . Smokeless tobacco: Never Used  Substance Use Topics  . Alcohol use: Yes    Alcohol/week: 0.0 standard drinks    Comment: 1/2 ca of beer every 2 weeks      Objective:   There were no vitals taken for this visit. There were no vitals filed for this visit.There is no height or weight on file to calculate BMI.   Physical Exam Vitals signs reviewed.  Constitutional:      General: She is not in acute  distress.    Appearance: Normal appearance. She is well-developed. She is not ill-appearing.  HENT:     Head: Normocephalic and atraumatic.  Neck:     Musculoskeletal: Normal range of motion and neck supple.  Pulmonary:     Effort: Pulmonary effort is normal. No respiratory distress.  Neurological:     Mental Status: She is alert.  Psychiatric:        Mood and Affect: Mood normal.        Behavior: Behavior normal.        Thought Content: Thought content normal.        Judgment: Judgment normal.      No results found for any visits on 12/05/18.     Assessment & Plan     1. Moderate persistent asthmatic bronchitis with acute exacerbation Will treat as we have previously for her acute exacerbations with doxycycline and prednisone. Continue inhalers as prescribed. Rest and push fluids. Call if worsening.  - doxycycline (VIBRA-TABS) 100 MG tablet; Take 1 tablet (100 mg total) by mouth 2 (two) times daily.  Dispense: 20 tablet; Refill: 0 - predniSONE (DELTASONE) 10 MG tablet; Take 6 tablets PO on day 1 and day 2, take 5 tablets PO on day 3 and day 4, take 4 tablets PO on day 5 and day 6, take 3 tablets PO on day 7 and day 8, take 2 tablets PO on day 9 and day 10, take one tablet PO on day 11 and day 12.  Dispense: 42 tablet; Refill: 0  2. Suspected UTI Cloudy urine and foul smell with associated back pain. Will treat with doxycycline as below. If not improving patient will need to be seen in office for urine culture.  - doxycycline (VIBRA-TABS) 100 MG tablet; Take 1 tablet (100 mg total) by mouth 2 (two) times daily.  Dispense: 20 tablet; Refill: 0  3. Type 2 diabetes mellitus without complication, without long-term current use of insulin (HCC) Stable. Diagnosis pulled for medication refill. Continue current medical treatment plan. - glipiZIDE (GLUCOTROL XL) 10 MG 24 hr tablet; Take 1 tablet (10 mg total) by mouth daily with breakfast.  Dispense: 90 tablet; Refill: 1   I discussed the  assessment and treatment plan with the patient. The patient was provided an opportunity to ask questions and all were answered. The patient agreed with the plan and demonstrated an understanding of the instructions.   The patient was advised to call back or seek an in-person evaluation if the symptoms worsen or if the condition fails to improve as anticipated.  I provided 15 minutes of non-face-to-face time during this encounter.    Mar Daring, PA-C  [facility]

## 2018-12-05 ENCOUNTER — Ambulatory Visit (INDEPENDENT_AMBULATORY_CARE_PROVIDER_SITE_OTHER): Payer: Medicare Other | Admitting: Physician Assistant

## 2018-12-05 ENCOUNTER — Encounter: Payer: Self-pay | Admitting: Physician Assistant

## 2018-12-05 DIAGNOSIS — J4541 Moderate persistent asthma with (acute) exacerbation: Secondary | ICD-10-CM | POA: Diagnosis not present

## 2018-12-05 DIAGNOSIS — R3989 Other symptoms and signs involving the genitourinary system: Secondary | ICD-10-CM

## 2018-12-05 DIAGNOSIS — E119 Type 2 diabetes mellitus without complications: Secondary | ICD-10-CM

## 2018-12-05 MED ORDER — GLIPIZIDE ER 10 MG PO TB24: 10.0000 mg | ORAL_TABLET | Freq: Every day | ORAL | 1 refills | Status: DC

## 2018-12-05 MED ORDER — DOXYCYCLINE HYCLATE 100 MG PO TABS: 100.0000 mg | ORAL_TABLET | Freq: Two times a day (BID) | ORAL | 0 refills | Status: DC

## 2018-12-05 MED ORDER — PREDNISONE 10 MG PO TABS: ORAL_TABLET | ORAL | 0 refills | Status: DC

## 2018-12-11 NOTE — Progress Notes (Signed)
Patient: Joanna Bishop Female    DOB: June 08, 1955   63 y.o.   MRN: [id number] Visit Date: 12/12/2018  Today's Provider: Mar Daring, PA-C   Chief Complaint  Patient presents with  . Follow-up  . Diabetes   Subjective:     HPI   Diabetes Mellitus Type II, Follow-up:   Lab Results  Component Value Date   HGBA1C 7.9 (A) 12/12/2018   HGBA1C 6.8 (A) 05/23/2018   HGBA1C 7.9 (A) 03/31/2018    Last seen for diabetes 6 months ago.  Management since then includes none. She reports excellent compliance with treatment. She is not having side effects.  Current symptoms include none and have been unchanged. Home blood sugar records: fasting range: occasionally  Episodes of hypoglycemia? no Current insulin regiment: n/a Most Recent Eye Exam: due Weight trend: stable Prior visit with dietician: No Current exercise: some Current diet habits: well balanced  Pertinent Labs:    Component Value Date/Time   CHOL 168 12/20/2017 0924   TRIG 310 (H) 12/20/2017 0924   HDL 35 (L) 12/20/2017 0924   LDLCALC 71 12/20/2017 0924   CREATININE 0.96 03/12/2018 0932    Wt Readings from Last 3 Encounters:  12/12/18 245 lb (111.1 kg)  07/07/18 250 lb (113.4 kg)  05/23/18 259 lb 12.8 oz (117.8 kg)   -------------------------------------------------------------    Allergies  Allergen Reactions  . Penicillins      Current Outpatient Medications:  .  albuterol (PROVENTIL HFA;VENTOLIN HFA) 108 (90 Base) MCG/ACT inhaler, Inhale 1-2 puffs into the lungs every 4 (four) hours as needed for wheezing or shortness of breath. Every 4-6 hours as needed, Disp: 1 Inhaler, Rfl: 11 .  aspirin 81 MG tablet, Take by mouth daily., Disp: , Rfl:  .  benztropine (COGENTIN) 0.5 MG tablet, Take 1 tablet (0.5 mg total) by mouth 2 (two) times daily., Disp: 180 tablet, Rfl: 3 .  calcium carbonate (OS-CAL) 600 MG TABS tablet, Take by mouth., Disp: , Rfl:  .  divalproex (DEPAKOTE ER) 500 MG 24 hr  tablet, Take 2 tablets (1,000 mg total) by mouth at bedtime., Disp: 180 tablet, Rfl: 3 .  glipiZIDE (GLUCOTROL XL) 10 MG 24 hr tablet, Take 1 tablet (10 mg total) by mouth daily with breakfast., Disp: 90 tablet, Rfl: 1 .  losartan (COZAAR) 25 MG tablet, TAKE 2 TABLETS BY MOUTH EVERY DAY, Disp: 180 tablet, Rfl: 1 .  metFORMIN (GLUCOPHAGE) 500 MG tablet, Take 1 tablet (500 mg total) by mouth 2 (two) times daily with a meal., Disp: 180 tablet, Rfl: 1 .  montelukast (SINGULAIR) 10 MG tablet, Take 1 tablet (10 mg total) by mouth daily., Disp: 90 tablet, Rfl: 3 .  Multiple Vitamin (MULTI-VITAMIN) tablet, Take by mouth., Disp: , Rfl:  .  NON FORMULARY, Bladder Control Supplement Capsule, Disp: , Rfl:  .  predniSONE (DELTASONE) 10 MG tablet, Take 6 tablets PO on day 1 and day 2, take 5 tablets PO on day 3 and day 4, take 4 tablets PO on day 5 and day 6, take 3 tablets PO on day 7 and day 8, take 2 tablets PO on day 9 and day 10, take one tablet PO on day 11 and day 12., Disp: 42 tablet, Rfl: 0 .  umeclidinium bromide (INCRUSE ELLIPTA) 62.5 MCG/INH AEPB, Inhale 1 puff into the lungs daily., Disp: 30 each, Rfl: 11 .  XARELTO 10 MG TABS tablet, Take 10 mg by mouth every morning., Disp: ,  Rfl:  .  Calcium-Magnesium-Vitamin D (CALCIUM 500 PO), Take 1 tablet by mouth 2 (two) times daily., Disp: , Rfl:  .  doxycycline (VIBRA-TABS) 100 MG tablet, Take 1 tablet (100 mg total) by mouth 2 (two) times daily., Disp: 20 tablet, Rfl: 0 .  fluconazole (DIFLUCAN) 150 MG tablet, fluconazole 150 mg tablet  TAKE 1 TABLET (150 MG TOTAL) BY MOUTH ONCE FOR 1 DOSE, Disp: , Rfl:  .  fluticasone (FLONASE) 50 MCG/ACT nasal spray, Place 2 sprays into the nose daily., Disp: , Rfl:  .  HYDROcodone-homatropine (HYCODAN) 5-1.5 MG/5ML syrup, Take 5 mLs by mouth every 8 (eight) hours as needed for cough. (Patient not taking: Reported on 07/07/2018), Disp: 120 mL, Rfl: 0 .  OxyCODONE HCl, Abuse Deter, (OXAYDO) 5 MG TABA, oxycodone 5 mg tablet   TAKE 1 TABLET BY MOUTH EVERY 6 HOURS AS NEEDED, Disp: , Rfl:  .  senna-docusate (SENNA-PLUS) 8.6-50 MG tablet, Senna Plus 8.6 mg-50 mg tablet  TAKE 2 TABLETS BY MOUTH IN THE MORNING, Disp: , Rfl:   Review of Systems  Constitutional: Negative for appetite change, chills, fatigue and fever.  Respiratory: Negative for chest tightness and shortness of breath.   Cardiovascular: Negative for chest pain and palpitations.  Gastrointestinal: Negative for abdominal pain, nausea and vomiting.  Neurological: Negative for dizziness and weakness.    Social History   Tobacco Use  . Smoking status: Never Smoker  . Smokeless tobacco: Never Used  Substance Use Topics  . Alcohol use: Yes    Alcohol/week: 0.0 standard drinks    Comment: 1/2 ca of beer every 2 weeks      Objective:   BP (!) 134/92 (BP Location: Left Arm, Patient Position: Sitting, Cuff Size: Large)   Pulse 92   Temp (!) 96.8 F (36 C) (Other (Comment))   Resp 16   Ht 5\' 5"  (1.651 m)   Wt 245 lb (111.1 kg)   SpO2 95%   BMI 40.77 kg/m  Vitals:   12/12/18 1025  BP: (!) 134/92  Pulse: 92  Resp: 16  Temp: (!) 96.8 F (36 C)  TempSrc: Other (Comment)  SpO2: 95%  Weight: 245 lb (111.1 kg)  Height: 5\' 5"  (1.651 m)  Body mass index is 40.77 kg/m.   Physical Exam Vitals signs reviewed.  Constitutional:      General: She is not in acute distress.    Appearance: Normal appearance. She is well-developed. She is obese. She is not ill-appearing or diaphoretic.  Neck:     Musculoskeletal: Normal range of motion and neck supple.     Thyroid: No thyromegaly.     Vascular: No JVD.     Trachea: No tracheal deviation.  Cardiovascular:     Rate and Rhythm: Normal rate and regular rhythm.     Heart sounds: Normal heart sounds. No murmur. No friction rub. No gallop.   Pulmonary:     Effort: Pulmonary effort is normal. No respiratory distress.     Breath sounds: Normal breath sounds. No wheezing or rales.  Lymphadenopathy:      Cervical: No cervical adenopathy.  Neurological:     Mental Status: She is alert.      Results for orders placed or performed in visit on 12/12/18  POCT glycosylated hemoglobin (Hb A1C)  Result Value Ref Range   Hemoglobin A1C 7.9 (A) 4.0 - 5.6 %   Est. average glucose Bld gHb Est-mCnc 180        Assessment & Plan  1. Type 2 diabetes mellitus without complication, without long-term current use of insulin (HCC) A1c increased. Patient reports that she has not been taking her glipizide regularly. Meds refilled as below. Continue current plan and advised to make sure to take medications as prescribed. I will see her back in 3 months for next check.  - POCT glycosylated hemoglobin (Hb A1C) - glipiZIDE (GLUCOTROL XL) 10 MG 24 hr tablet; Take 1 tablet (10 mg total) by mouth daily with breakfast.  Dispense: 90 tablet; Refill: 1 - metFORMIN (GLUCOPHAGE) 500 MG tablet; Take 1 tablet (500 mg total) by mouth 2 (two) times daily with a meal.  Dispense: 180 tablet; Refill: 1  2. Bipolar depression (HCC) Stable. Diagnosis pulled for medication refill. Continue current medical treatment plan. - benztropine (COGENTIN) 0.5 MG tablet; Take 1 tablet (0.5 mg total) by mouth 2 (two) times daily.  Dispense: 180 tablet; Refill: 3  3. Seasonal allergic rhinitis due to pollen Stable. Diagnosis pulled for medication refill. Continue current medical treatment plan. - montelukast (SINGULAIR) 10 MG tablet; Take 1 tablet (10 mg total) by mouth daily.  Dispense: 90 tablet; Refill: 3  4. Class 3 severe obesity due to excess calories with serious comorbidity and body mass index (BMI) of 40.0 to 44.9 in adult [facility]) Counseled patient on healthy lifestyle modifications including dieting and exercise.   5. Need for influenza vaccination Flu vaccine given today without complication. Patient sat upright for 15 minutes to check for adverse reaction before being released. - Flu Vaccine QUAD 36+ mos IM     Margaretann Loveless, PA-C  [facility]

## 2018-12-12 ENCOUNTER — Encounter: Payer: Self-pay | Admitting: Physician Assistant

## 2018-12-12 ENCOUNTER — Other Ambulatory Visit: Payer: Self-pay

## 2018-12-12 ENCOUNTER — Ambulatory Visit (INDEPENDENT_AMBULATORY_CARE_PROVIDER_SITE_OTHER): Payer: Medicare Other | Admitting: Physician Assistant

## 2018-12-12 VITALS — BP 134/92 | HR 92 | Temp 96.8°F | Resp 16 | Ht 65.0 in | Wt 245.0 lb

## 2018-12-12 DIAGNOSIS — Z6841 Body Mass Index (BMI) 40.0 and over, adult: Secondary | ICD-10-CM | POA: Diagnosis not present

## 2018-12-12 DIAGNOSIS — Z23 Encounter for immunization: Secondary | ICD-10-CM | POA: Diagnosis not present

## 2018-12-12 DIAGNOSIS — E119 Type 2 diabetes mellitus without complications: Secondary | ICD-10-CM

## 2018-12-12 DIAGNOSIS — J301 Allergic rhinitis due to pollen: Secondary | ICD-10-CM

## 2018-12-12 DIAGNOSIS — F319 Bipolar disorder, unspecified: Secondary | ICD-10-CM

## 2018-12-12 LAB — POCT GLYCOSYLATED HEMOGLOBIN (HGB A1C)
Est. average glucose Bld gHb Est-mCnc: 180
Hemoglobin A1C: 7.9 % — AB (ref 4.0–5.6)

## 2018-12-12 MED ORDER — MONTELUKAST SODIUM 10 MG PO TABS: 10.0000 mg | ORAL_TABLET | Freq: Every day | ORAL | 3 refills | Status: DC

## 2018-12-12 MED ORDER — GLIPIZIDE ER 10 MG PO TB24: 10.0000 mg | ORAL_TABLET | Freq: Every day | ORAL | 1 refills | Status: DC

## 2018-12-12 MED ORDER — BENZTROPINE MESYLATE 0.5 MG PO TABS: 0.5000 mg | ORAL_TABLET | Freq: Two times a day (BID) | ORAL | 3 refills | Status: DC

## 2018-12-12 MED ORDER — METFORMIN HCL 500 MG PO TABS: 500.0000 mg | ORAL_TABLET | Freq: Two times a day (BID) | ORAL | 1 refills | Status: DC

## 2018-12-12 NOTE — Patient Instructions (Signed)

## 2018-12-16 ENCOUNTER — Telehealth: Payer: Self-pay | Admitting: Physician Assistant

## 2018-12-16 NOTE — Telephone Encounter (Signed)
Patient advised as below. Patient verbalizes understanding and is in agreement with treatment plan.  

## 2018-12-16 NOTE — Telephone Encounter (Signed)
Pt called to say she was having UTI symptoms again.  She came in last week and it was clear but the symptoms started back last night  CB#  [phone]  teri

## 2018-12-16 NOTE — Telephone Encounter (Signed)
Can she come by and give a urine without an appointment for [place] to send off. She has not had any recent cultures on file.

## 2018-12-16 NOTE — Telephone Encounter (Signed)
Please Review

## 2019-01-29 ENCOUNTER — Other Ambulatory Visit: Payer: Self-pay | Admitting: Physician Assistant

## 2019-01-29 DIAGNOSIS — E119 Type 2 diabetes mellitus without complications: Secondary | ICD-10-CM | POA: Diagnosis not present

## 2019-01-29 DIAGNOSIS — F319 Bipolar disorder, unspecified: Secondary | ICD-10-CM

## 2019-01-30 NOTE — Telephone Encounter (Signed)
Requested medication (s) are due for refill today: yes  Requested medication (s) are on the active medication list: yes  Last refill: 12/15/2018  Future visit scheduled: no  Notes to clinic: not delegated    Requested Prescriptions  Pending Prescriptions Disp Refills   divalproex (DEPAKOTE ER) 500 MG 24 hr tablet [Pharmacy Med Name: DIVALPROEX SOD ER 500 MG TAB] 180 tablet 3    Sig: Take 2 tablets (1,000 mg total) by mouth at bedtime.     Not Delegated - Neurology:  Anticonvulsants - Valproates Failed - 01/29/2019 10:14 PM      Failed - This refill cannot be delegated      Failed - ALT in normal range and within 360 days    ALT  Date Value Ref Range Status  03/12/2018 45 (H) 0 - 32 IU/L Final         Failed - Valproic Acid (serum) in normal range and within 360 days    No results found for: VALPROATE       Passed - AST in normal range and within 360 days    AST  Date Value Ref Range Status  03/12/2018 34 0 - 40 IU/L Final         Passed - HGB in normal range and within 360 days    Hemoglobin  Date Value Ref Range Status  03/12/2018 13.6 11.1 - 15.9 g/dL Final         Passed - PLT in normal range and within 360 days    Platelets  Date Value Ref Range Status  03/12/2018 191 150 - 450 x10E3/uL Final         Passed - WBC in normal range and within 360 days    WBC  Date Value Ref Range Status  03/12/2018 8.1 3.4 - 10.8 x10E3/uL Final  07/12/2015 8.2 3.6 - 11.0 K/uL Final         Passed - HCT in normal range and within 360 days    Hematocrit  Date Value Ref Range Status  03/12/2018 40.6 34.0 - 46.6 % Final         Passed - Valid encounter within last 12 months    Recent Outpatient Visits          1 month ago Type 2 diabetes mellitus without complication, without long-term current use of insulin [facility])   [place], [place], [place]   1 month ago Moderate persistent asthmatic bronchitis with acute exacerbation   [facility] [place], [place], PA-C   8 months ago Type 2 diabetes mellitus without complication, without long-term current use of insulin [facility])   [place], [place], [place]   10 months ago Type 2 diabetes mellitus without complication, without long-term current use of insulin [facility])   [place], [place], [place]   10 months ago Type 2 diabetes mellitus without complication, without long-term current use of insulin [facility])   [place], [place], [place]

## 2019-02-10 ENCOUNTER — Encounter: Payer: Self-pay | Admitting: Physician Assistant

## 2019-02-10 ENCOUNTER — Ambulatory Visit (INDEPENDENT_AMBULATORY_CARE_PROVIDER_SITE_OTHER): Payer: Medicare Other | Admitting: Physician Assistant

## 2019-02-10 VITALS — Temp 98.0°F | Wt 250.0 lb

## 2019-02-10 DIAGNOSIS — J4541 Moderate persistent asthma with (acute) exacerbation: Secondary | ICD-10-CM | POA: Diagnosis not present

## 2019-02-10 MED ORDER — DOXYCYCLINE HYCLATE 100 MG PO TABS: 100.0000 mg | ORAL_TABLET | Freq: Two times a day (BID) | ORAL | 0 refills | Status: DC

## 2019-02-10 MED ORDER — PREDNISONE 10 MG PO TABS: ORAL_TABLET | ORAL | 0 refills | Status: DC

## 2019-02-10 MED ORDER — HYDROCODONE-HOMATROPINE 5-1.5 MG/5ML PO SYRP: 5.0000 mL | ORAL_SOLUTION | Freq: Three times a day (TID) | ORAL | 0 refills | Status: DC | PRN

## 2019-02-10 NOTE — Progress Notes (Signed)
Patient: Joanna Bishop Female    DOB: 1955-05-07   63 y.o.   MRN: [id number] Visit Date: 02/10/2019  Today's Provider: Margaretann Loveless, PA-C   Chief Complaint  Patient presents with  . Cough   Subjective:     Virtual Visit via Telephone Note  I connected with Joanna Bishop on 02/10/19 at  4:00 PM EST by telephone and verified that I am speaking with the correct person using two identifiers.  Location: Patient: Home Provider: [facility]   I discussed the limitations, risks, security and privacy concerns of performing an evaluation and management service by telephone and the availability of in person appointments. I also discussed with the patient that there may be a patient responsible charge related to this service. The patient expressed understanding and agreed to proceed.   Cough This is a new problem. The current episode started in the past 7 days (Last Friday). The problem has been gradually worsening. The problem occurs constantly. The cough is non-productive. Associated symptoms include postnasal drip and shortness of breath ("after her cough"). Pertinent negatives include no chest pain, chills, ear congestion, ear pain, fever, headaches, nasal congestion, rhinorrhea, sore throat or wheezing. The symptoms are aggravated by cold air ("moving"). She has tried OTC cough suppressant (Nyquil) for the symptoms. The treatment provided no relief.      Allergies  Allergen Reactions  . Penicillins      Current Outpatient Medications:  .  albuterol (PROVENTIL HFA;VENTOLIN HFA) 108 (90 Base) MCG/ACT inhaler, Inhale 1-2 puffs into the lungs every 4 (four) hours as needed for wheezing or shortness of breath. Every 4-6 hours as needed, Disp: 1 Inhaler, Rfl: 11 .  aspirin 81 MG tablet, Take by mouth daily., Disp: , Rfl:  .  benztropine (COGENTIN) 0.5 MG tablet, Take 1 tablet (0.5 mg total) by mouth 2 (two) times daily., Disp: 180 tablet, Rfl: 3 .  calcium carbonate (OS-CAL) 600 MG  TABS tablet, Take by mouth., Disp: , Rfl:  .  Calcium-Magnesium-Vitamin D (CALCIUM 500 PO), Take 1 tablet by mouth 2 (two) times daily., Disp: , Rfl:  .  divalproex (DEPAKOTE ER) 500 MG 24 hr tablet, TAKE 2 TABLETS (1,000 MG TOTAL) BY MOUTH AT BEDTIME., Disp: 180 tablet, Rfl: 3 .  fluticasone (FLONASE) 50 MCG/ACT nasal spray, Place 2 sprays into the nose daily., Disp: , Rfl:  .  glipiZIDE (GLUCOTROL XL) 10 MG 24 hr tablet, Take 1 tablet (10 mg total) by mouth daily with breakfast., Disp: 90 tablet, Rfl: 1 .  losartan (COZAAR) 25 MG tablet, TAKE 2 TABLETS BY MOUTH EVERY DAY, Disp: 180 tablet, Rfl: 1 .  metFORMIN (GLUCOPHAGE) 500 MG tablet, Take 1 tablet (500 mg total) by mouth 2 (two) times daily with a meal., Disp: 180 tablet, Rfl: 1 .  montelukast (SINGULAIR) 10 MG tablet, Take 1 tablet (10 mg total) by mouth daily., Disp: 90 tablet, Rfl: 3 .  Multiple Vitamin (MULTI-VITAMIN) tablet, Take by mouth., Disp: , Rfl:  .  NON FORMULARY, Bladder Control Supplement Capsule, Disp: , Rfl:  .  OxyCODONE HCl, Abuse Deter, (OXAYDO) 5 MG TABA, oxycodone 5 mg tablet  TAKE 1 TABLET BY MOUTH EVERY 6 HOURS AS NEEDED, Disp: , Rfl:  .  predniSONE (DELTASONE) 10 MG tablet, Take 6 tablets PO on day 1 and day 2, take 5 tablets PO on day 3 and day 4, take 4 tablets PO on day 5 and day 6, take 3 tablets PO on  day 7 and day 8, take 2 tablets PO on day 9 and day 10, take one tablet PO on day 11 and day 12., Disp: 42 tablet, Rfl: 0 .  umeclidinium bromide (INCRUSE ELLIPTA) 62.5 MCG/INH AEPB, Inhale 1 puff into the lungs daily., Disp: 30 each, Rfl: 11 .  XARELTO 10 MG TABS tablet, Take 10 mg by mouth every morning., Disp: , Rfl:   Review of Systems  Constitutional: Negative for chills, fatigue and fever.  HENT: Positive for congestion and postnasal drip. Negative for ear pain, rhinorrhea, sinus pressure and sore throat.   Respiratory: Positive for cough, chest tightness and shortness of breath ("after her cough"). Negative  for wheezing.   Cardiovascular: Negative for chest pain, palpitations and leg swelling.  Gastrointestinal: Negative for abdominal pain and nausea.  Neurological: Negative for dizziness and headaches.    Social History   Tobacco Use  . Smoking status: Never Smoker  . Smokeless tobacco: Never Used  Substance Use Topics  . Alcohol use: Yes    Alcohol/week: 0.0 standard drinks    Comment: 1/2 ca of beer every 2 weeks      Objective:   Temp 98 F (36.7 C) (Temporal)   Wt 250 lb (113.4 kg)   BMI 41.60 kg/m  Vitals:   02/10/19 1357  Temp: 98 F (36.7 C)  TempSrc: Temporal  Weight: 250 lb (113.4 kg)  Body mass index is 41.6 kg/m.   Physical Exam Vitals reviewed.  Constitutional:      General: She is not in acute distress. Pulmonary:     Effort: No respiratory distress.  Neurological:     Mental Status: She is alert.      No results found for any visits on 02/10/19.     Assessment & Plan     1. Moderate persistent asthmatic bronchitis with acute exacerbation High risk for pneumonia complications due to severe asthma and bronchitis overlap syndrome. Will treat with doxycycline, prednisone and Hycodan cough syrup as below. She is isolating. Push fluids, rest as needed. Call if worsening.  - predniSONE (DELTASONE) 10 MG tablet; Take 6 tablets PO on day 1 and day 2, take 5 tablets PO on day 3 and day 4, take 4 tablets PO on day 5 and day 6, take 3 tablets PO on day 7 and day 8, take 2 tablets PO on day 9 and day 10, take one tablet PO on day 11 and day 12.  Dispense: 42 tablet; Refill: 0 - doxycycline (VIBRA-TABS) 100 MG tablet; Take 1 tablet (100 mg total) by mouth 2 (two) times daily.  Dispense: 20 tablet; Refill: 0 - HYDROcodone-homatropine (HYCODAN) 5-1.5 MG/5ML syrup; Take 5 mLs by mouth every 8 (eight) hours as needed for cough.  Dispense: 120 mL; Refill: 0   I discussed the assessment and treatment plan with the patient. The patient was provided an opportunity to  ask questions and all were answered. The patient agreed with the plan and demonstrated an understanding of the instructions.   The patient was advised to call back or seek an in-person evaluation if the symptoms worsen or if the condition fails to improve as anticipated.  I provided 10 minutes of non-face-to-face time during this encounter.    Mar Daring, PA-C  [facility]

## 2019-02-10 NOTE — Patient Instructions (Signed)
Acute Bronchitis, Adult ° °Acute bronchitis is sudden (acute) swelling of the air tubes (bronchi) in the lungs. Acute bronchitis causes these tubes to fill with mucus, which can make it hard to breathe. It can also cause coughing or wheezing. °In adults, acute bronchitis usually goes away within 2 weeks. A cough caused by bronchitis may last up to 3 weeks. Smoking, allergies, and asthma can make the condition worse. Repeated episodes of bronchitis may cause further lung problems, such as chronic obstructive pulmonary disease (COPD). °What are the causes? °This condition can be caused by germs and by substances that irritate the lungs, including: °· Cold and flu viruses. This condition is most often caused by the same virus that causes a cold. °· Bacteria. °· Exposure to tobacco smoke, dust, fumes, and air pollution. °What increases the risk? °This condition is more likely to develop in people who: °· Have close contact with someone with acute bronchitis. °· Are exposed to lung irritants, such as tobacco smoke, dust, fumes, and vapors. °· Have a weak immune system. °· Have a respiratory condition such as asthma. °What are the signs or symptoms? °Symptoms of this condition include: °· A cough. °· Coughing up clear, yellow, or green mucus. °· Wheezing. °· Chest congestion. °· Shortness of breath. °· A fever. °· Body aches. °· Chills. °· A sore throat. °How is this diagnosed? °This condition is usually diagnosed with a physical exam. During the exam, your health care provider may order tests, such as chest X-rays, to rule out other conditions. He or she may also: °· Test a sample of your mucus for bacterial infection. °· Check the level of oxygen in your blood. This is done to check for pneumonia. °· Do a chest X-ray or lung function testing to rule out pneumonia and other conditions. °· Perform blood tests. °Your health care provider will also ask about your symptoms and medical history. °How is this treated? °Most  cases of acute bronchitis clear up over time without treatment. Your health care provider may recommend: °· Drinking more fluids. Drinking more makes your mucus thinner, which may make it easier to breathe. °· Taking a medicine for a fever or cough. °· Taking an antibiotic medicine. °· Using an inhaler to help improve shortness of breath and to control a cough. °· Using a cool mist vaporizer or humidifier to make it easier to breathe. °Follow these instructions at home: °Medicines °· Take over-the-counter and prescription medicines only as told by your health care provider. °· If you were prescribed an antibiotic, take it as told by your health care provider. Do not stop taking the antibiotic even if you start to feel better. °General instructions ° °· Get plenty of rest. °· Drink enough fluids to keep your urine pale yellow. °· Avoid smoking and secondhand smoke. Exposure to cigarette smoke or irritating chemicals will make bronchitis worse. If you smoke and you need help quitting, ask your health care provider. Quitting smoking will help your lungs heal faster. °· Use an inhaler, cool mist vaporizer, or humidifier as told by your health care provider. °· Keep all follow-up visits as told by your health care provider. This is important. °How is this prevented? °To lower your risk of getting this condition again: °· Wash your hands often with soap and water. If soap and water are not available, use hand sanitizer. °· Avoid contact with people who have cold symptoms. °· Try not to touch your hands to your mouth, nose, or eyes. °·   Make sure to get the flu shot every year. °Contact a health care provider if: °· Your symptoms do not improve in 2 weeks of treatment. °Get help right away if: °· You cough up blood. °· You have chest pain. °· You have severe shortness of breath. °· You become dehydrated. °· You faint or keep feeling like you are going to faint. °· You keep vomiting. °· You have a severe headache. °· Your  fever or chills gets worse. °This information is not intended to replace advice given to you by your health care provider. Make sure you discuss any questions you have with your health care provider. °Document Released: 03/22/2004 Document Revised: 12/26/2017 Document Reviewed: 08/03/2015 °Elsevier Patient Education © 2020 [employer] ° °

## 2019-02-11 ENCOUNTER — Ambulatory Visit: Payer: Self-pay | Admitting: Physician Assistant

## 2019-02-11 ENCOUNTER — Telehealth: Payer: Medicare Other | Admitting: Physician Assistant

## 2019-03-24 ENCOUNTER — Other Ambulatory Visit: Payer: Self-pay | Admitting: Physician Assistant

## 2019-03-24 DIAGNOSIS — J4541 Moderate persistent asthma with (acute) exacerbation: Secondary | ICD-10-CM

## 2019-03-24 NOTE — Telephone Encounter (Signed)
Requested medication (s) are due for refill today: yes  Requested medication (s) are on the active medication list: yes  Last refill: 01/20/2018  Future visit scheduled: no  Notes to clinic: review script for refill One inhaler should last one month   Requested Prescriptions  Pending Prescriptions Disp Refills   albuterol (VENTOLIN HFA) 108 (90 Base) MCG/ACT inhaler [Pharmacy Med Name: ALBUTEROL HFA (PROAIR) INHALER]  11    Sig: INHALE 1-2 PUFFS INTO THE LUNGS EVERY 4 TO 6 HOURS AS NEEDED FOR WHEEZING OR SHORTNESS OF BREATH.      Pulmonology:  Beta Agonists Failed - 03/24/2019  1:41 PM      Failed - One inhaler should last at least one month. If the patient is requesting refills earlier, contact the patient to check for uncontrolled symptoms.      Passed - Valid encounter within last 12 months    Recent Outpatient Visits           1 month ago Moderate persistent asthmatic bronchitis with acute exacerbation   [facility] [place], [place], [place]   3 months ago Type 2 diabetes mellitus without complication, without long-term current use of insulin [facility])   [facility] [place], [place], [place]   3 months ago Moderate persistent asthmatic bronchitis with acute exacerbation   [facility] [place], [place]   10 months ago Type 2 diabetes mellitus without complication, without long-term current use of insulin [facility])   [facility] [place] Man [place], [place]   11 months ago Type 2 diabetes mellitus without complication, without long-term current use of insulin [facility])   [facility], [place], [place]

## 2019-04-01 DIAGNOSIS — Z20828 Contact with and (suspected) exposure to other viral communicable diseases: Secondary | ICD-10-CM | POA: Diagnosis not present

## 2019-06-25 ENCOUNTER — Ambulatory Visit (INDEPENDENT_AMBULATORY_CARE_PROVIDER_SITE_OTHER): Payer: Medicare HMO

## 2019-06-25 ENCOUNTER — Other Ambulatory Visit: Payer: Self-pay

## 2019-06-25 ENCOUNTER — Ambulatory Visit: Payer: Self-pay

## 2019-06-25 ENCOUNTER — Ambulatory Visit
Admission: EM | Admit: 2019-06-25 | Discharge: 2019-06-25 | Disposition: A | Discharge status: Home / Self Care | Payer: Medicare HMO | Attending: Urgent Care | Admitting: Urgent Care

## 2019-06-25 DIAGNOSIS — Z20822 Contact with and (suspected) exposure to covid-19: Secondary | ICD-10-CM | POA: Insufficient documentation

## 2019-06-25 DIAGNOSIS — Z889 Allergy status to unspecified drugs, medicaments and biological substances status: Secondary | ICD-10-CM | POA: Insufficient documentation

## 2019-06-25 DIAGNOSIS — J4521 Mild intermittent asthma with (acute) exacerbation: Secondary | ICD-10-CM | POA: Diagnosis not present

## 2019-06-25 DIAGNOSIS — R0602 Shortness of breath: Secondary | ICD-10-CM | POA: Diagnosis present

## 2019-06-25 DIAGNOSIS — Z0389 Encounter for observation for other suspected diseases and conditions ruled out: Secondary | ICD-10-CM | POA: Diagnosis not present

## 2019-06-25 DIAGNOSIS — J4541 Moderate persistent asthma with (acute) exacerbation: Secondary | ICD-10-CM | POA: Diagnosis not present

## 2019-06-25 DIAGNOSIS — R0789 Other chest pain: Secondary | ICD-10-CM

## 2019-06-25 DIAGNOSIS — R079 Chest pain, unspecified: Secondary | ICD-10-CM | POA: Diagnosis not present

## 2019-06-25 LAB — CBC WITH DIFFERENTIAL/PLATELET
Abs Immature Granulocytes: 0.05 10*3/uL (ref 0.00–0.07)
Basophils Absolute: 0.1 10*3/uL (ref 0.0–0.1)
Basophils Relative: 1 %
Eosinophils Absolute: 0.4 10*3/uL (ref 0.0–0.5)
Eosinophils Relative: 4 %
HCT: 41.6 % (ref 36.0–46.0)
Hemoglobin: 14.2 g/dL (ref 12.0–15.0)
Immature Granulocytes: 1 %
Lymphocytes Relative: 31 %
Lymphs Abs: 2.9 10*3/uL (ref 0.7–4.0)
MCH: 29.8 pg (ref 26.0–34.0)
MCHC: 34.1 g/dL (ref 30.0–36.0)
MCV: 87.4 fL (ref 80.0–100.0)
Monocytes Absolute: 0.9 10*3/uL (ref 0.1–1.0)
Monocytes Relative: 10 %
Neutro Abs: 5.2 10*3/uL (ref 1.7–7.7)
Neutrophils Relative %: 53 %
Platelets: 195 10*3/uL (ref 150–400)
RBC: 4.76 MIL/uL (ref 3.87–5.11)
RDW: 12.7 % (ref 11.5–15.5)
WBC: 9.5 10*3/uL (ref 4.0–10.5)
nRBC: 0 % (ref 0.0–0.2)

## 2019-06-25 LAB — BASIC METABOLIC PANEL
Anion gap: 10 (ref 5–15)
BUN: 24 mg/dL — ABNORMAL HIGH (ref 8–23)
CO2: 24 mmol/L (ref 22–32)
Calcium: 9.5 mg/dL (ref 8.9–10.3)
Chloride: 101 mmol/L (ref 98–111)
Creatinine, Ser: 0.99 mg/dL (ref 0.44–1.00)
GFR calc Af Amer: >60 mL/min (ref >60)
GFR calc non Af Amer: >60 mL/min (ref >60)
Glucose, Bld: 337 mg/dL — ABNORMAL HIGH (ref 70–99)
Potassium: 4.4 mmol/L (ref 3.5–5.1)
Sodium: 135 mmol/L (ref 135–145)

## 2019-06-25 LAB — TROPONIN I (HIGH SENSITIVITY): Troponin I (High Sensitivity): 5 ng/L (ref <18)

## 2019-06-25 MED ORDER — PREDNISONE 20 MG PO TABS
40.0000 mg | ORAL_TABLET | Freq: Every day | ORAL | 0 refills | Status: DC
Start: 2019-06-25 — End: 2019-08-28

## 2019-06-25 MED ORDER — DOXYCYCLINE HYCLATE 100 MG PO TABS: 100.0000 mg | ORAL_TABLET | Freq: Two times a day (BID) | ORAL | 0 refills | Status: AC

## 2019-06-25 NOTE — Discharge Instructions (Addendum)
It was very nice seeing you today in clinic. Thank you for entrusting me with your care.   Your labs and chest xray looked fine. Rest and Stay HYDRATED. Water and electrolyte containing beverages (Gatorade, Pedialyte) are best to prevent dehydration and electrolyte abnormalities.  May use Tylenol and/or Ibuprofen as needed for pain. Continue your inhalers and daily allergy medication. Watch your blood sugars.   You were tested for SARS-CoV-2 (novel coronavirus) today. Testing is being processed at the main campus of [facility] in [place], and have been taking 12-24 hours to come back. Current recommendations from the the [facility] and [facility] DHHS require that you remain at home until negative test results are have been received. In the event that your test results are positive, you will be contacted with further directives. These measures are being implemented out of an abundance of caution to prevent transmission and spread during the current SARS-CoV-2 pandemic.  Make arrangements to follow up with your regular doctor in 1 week for re-evaluation if not improving. If your symptoms/condition worsens, please seek follow up care either here or in the ER. Please remember, our [facility] providers are "right here with you" when you need [place].   Again, it was my pleasure to take care of you today. Thank you for choosing our clinic. I hope that you start to feel better quickly.   Quentin Mulling, MSN, APRN, FNP-C, CEN Advanced Practice Provider [facility] [place] [facility]

## 2019-06-25 NOTE — ED Triage Notes (Signed)
Pt reports working out in pollen last weekend and now has chest tightness for past 4-5 days. Denies cough. Endorses SOB but this is "normal for her."

## 2019-06-25 NOTE — Telephone Encounter (Signed)
Pt. Called with c/o chest tightness in mid chest that comes and goes, since Sunday  Reported the chest tightness is mid chest and does not radiate at all; discomfort comes and goes; lasts about 15", and eases up when she rests.  Rated the discomfort at 7/10.  Reported shortness of breath with activity, and can occur with rest.  Denied any associated nausea, vomiting, dizziness, sweating with the chest tightness.  Has increased use of rescue inhaler over past 4 days, to relieve the shortness of breath.  Reported she worked in the yard on Sat. and Sun.; thought it could have triggered her asthma, due to the high pollen count.  Pt. Denied experiencing chest pain at this time.  Reported last episode was approx. 9:00 AM today.    Called Flow Coordinator.  Advised due to symptoms and COVID restrictions, she should go to the ER.  Advised pt. of recommendation to go to the ER.  Pt. Verb. Understanding.  Stated her daughter can take her.      Reason for Disposition . [1] Chest pain lasts > 5 minutes AND [2] occurred in past 3 days (72 hours)  Answer Assessment - Initial Assessment Questions 1. LOCATION: "Where does it hurt?"       Middle of chest 2. RADIATION: "Does the pain go anywhere else?" (e.g., into neck, jaw, arms, back)     Denied radiation 3. ONSET: "When did the chest pain begin?" (Minutes, hours or days)      4 days ago 4. PATTERN "Does the pain come and go, or has it been constant since it started?"  "Does it get worse with exertion?"      Comes and goes 5. DURATION: "How long does it last" (e.g., seconds, minutes, hours)     15 -20 min.  6. SEVERITY: "How bad is the pain?"  (e.g., Scale 1-10; mild, moderate, or severe)    - MILD (1-3): doesn't interfere with normal activities     - MODERATE (4-7): interferes with normal activities or awakens from sleep    - SEVERE (8-10): excruciating pain, unable to do any normal activities       7/10  7. CARDIAC RISK FACTORS: "Do you have any history of  heart problems or risk factors for heart disease?" (e.g., angina, prior heart attack; diabetes, high blood pressure, high cholesterol, smoker, or strong family history of heart disease)    Positive for Diabetes.  Denied HTN, high cholesterol , or smoking.  Denied family hx of heart disease  8. PULMONARY RISK FACTORS: "Do you have any history of lung disease?"  (e.g., blood clots in lung, asthma, emphysema, birth control pills)     Hx of asthma ; increased use of inhaler since working in yard last weekend  9. CAUSE: "What do you think is causing the chest pain?"     Did yard work, and thought it was related to the pollen  10. OTHER SYMPTOMS: "Do you have any other symptoms?" (e.g., dizziness, nausea, vomiting, sweating, fever, difficulty breathing, cough)       Denied dizziness, nausea, vomiting, sweating.  Stated shortness of breath with activity, and can occur at rest 11. PREGNANCY: "Is there any chance you are pregnant?" "When was your last menstrual period?"       N/a  Protocols used: CHEST PAIN-A-AH

## 2019-06-26 LAB — SARS CORONAVIRUS 2 (TAT 6-24 HRS): SARS Coronavirus 2: NEGATIVE

## 2019-06-26 NOTE — ED Provider Notes (Signed)
[place], [place]   Name: Joanna Bishop DOB: March 02, 1955 MRN: [id number] CSN: [id number] PCP: Margaretann Loveless, PA-C  Arrival date and time:  06/25/19 1720  Chief Complaint:  Chest Pain  NOTE: Prior to seeing the patient today, I have reviewed the triage nursing documentation and vital signs. Clinical staff has updated patient's PMH/PSHx, current medication list, and drug allergies/intolerances to ensure comprehensive history available to assist in medical decision making.   History:   HPI: Joanna Bishop is a 64 y.o. female who presents today with complaints of chest tightness for the last 4-5 days. Symptoms have worsened since onset. Patient reports that she has been working outside and feels like the pollen may have aggravated her asthma. Patient endorses shortness of breath that is above her normal baseline. She denies any fever or chills. She denies any cough and wheezing. She has been using her prescribed SABA (albuterol) MDI more since the onset of her symptoms; using TID-QID. Last used MDI this morning with little to no perceived benefits. Patient denies being in close contact with anyone known to be ill. She has not been tested for SARS-CoV-2 (novel coronavirus) in the past 14 days; last tested negative approximately 2 months ago per her report. She has not received any SARS-CoV-2 vaccines. In efforts to conservatively manage her symptoms at home, the patient notes that she has used multiple interventions (Aleve, sinus medication, diphenhydramine, montelukast, fluticasone, albuterol, and Incruse) which has not helped to improve her symptoms significantly. Patient presents to clinic hypertensive at 178/100.  Past Medical History:  Diagnosis Date  . Anxiety   . Asthma   . Depression   . Diabetes mellitus without complication [facility])     Past Surgical History:  Procedure Laterality Date  . ABDOMINAL HYSTERECTOMY    . ARTHROPLASTY    . CHOLECYSTECTOMY    . FEMUR FRACTURE SURGERY    .  TOTAL KNEE ARTHROPLASTY Left     Family History  Problem Relation Age of Onset  . Diabetes Mother   . COPD Mother   . Diabetes Father   . Coronary artery disease Father   . Coronary artery disease Maternal Grandmother   . Heart attack Maternal Grandmother   . Diabetes Paternal Grandmother     Social History   Tobacco Use  . Smoking status: Never Smoker  . Smokeless tobacco: Never Used  Substance Use Topics  . Alcohol use: Not Currently    Alcohol/week: 0.0 standard drinks  . Drug use: Never    Patient Active Problem List   Diagnosis Date Noted  . Stiffness of right knee 06/18/2018  . Type 2 diabetes mellitus without complication, without long-term current use of insulin (HCC) 10/17/2015  . Carpal tunnel syndrome of left wrist 10/14/2015  . Bipolar depression (HCC) 07/12/2015  . 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 AB (asthmatic bronchitis) 08/02/2014  . Hypercholesteremia 08/02/2014  . History of suicidal ideation 08/02/2014  . Avitaminosis D 08/02/2014  . Generalized hyperhidrosis 08/02/2014  . Airway hyperreactivity 08/02/2014  . Anxiety disorder 08/02/2014  . Insomnia 08/02/2014  . Major depressive disorder, single episode 08/02/2014    Home Medications:    No outpatient medications have been marked as taking for the 06/25/19 encounter [facility] Encounter).    Allergies:   Penicillins  Review of Systems (ROS):  Review of systems NEGATIVE unless otherwise noted in narrative H&P section.   Vital Signs: Today's Vitals   06/25/19 1737 06/25/19 1740 06/25/19 1903 06/25/19 2009  BP:  Marland Kitchen)  178/100    Pulse:  (!) 108    Resp:  (!) 22    Temp:  98.3 F (36.8 C)    TempSrc:  Oral    SpO2:  96%    Weight: 240 lb (108.9 kg)     Height: 5' 4.5" (1.638 m)     PainSc:   6  6     Physical Exam: Physical Exam  Constitutional: She is oriented to person, place, and time and well-developed, well-nourished, and in no distress.  HENT:  Head: Normocephalic  and atraumatic.  Nose: Mucosal edema present. No rhinorrhea or sinus tenderness.  Mouth/Throat: Uvula is midline, oropharynx is clear and moist and mucous membranes are normal.  Eyes: Pupils are equal, round, and reactive to light.  Cardiovascular: Regular rhythm, normal heart sounds and intact distal pulses. Tachycardia present.  Pulmonary/Chest: Effort normal. Tachypnea noted. She has wheezes (expiratory). She has rhonchi (upper airways; clears somewhat with cough).  No cough noted in clinic. No SOB or increased WOB. No distress. Able to speak in complete sentences without difficulties. SPO2 96% on RA.  Abdominal: Soft. Normal appearance and bowel sounds are normal. She exhibits no distension. There is no abdominal tenderness. There is no CVA tenderness.  Neurological: She is alert and oriented to person, place, and time. Gait normal.  Skin: Skin is warm and dry. No rash noted. She is not diaphoretic.  Psychiatric: Mood, memory, affect and judgment normal.  Nursing note and vitals reviewed.   Urgent Care Treatments / Results:   Orders Placed This Encounter  Procedures  . SARS CORONAVIRUS 2 (TAT 6-24 HRS) Nasopharyngeal Nasopharyngeal Swab  . DG Chest 2 View  . DG Chest 1 View  . CBC with Differential  . Basic metabolic panel  . ED EKG  . EKG 12-Lead    LABS: PLEASE NOTE: all labs that were ordered this encounter are listed, however only abnormal results are displayed. Labs Reviewed  BASIC METABOLIC PANEL - Abnormal; Notable for the following components:      Result Value   Glucose, Bld 337 (*)    BUN 24 (*)    All other components within normal limits  SARS CORONAVIRUS 2 (TAT 6-24 HRS)  CBC WITH DIFFERENTIAL/PLATELET  TROPONIN I (HIGH SENSITIVITY)    URGENT CARE ECG REPORT Date: 06/25/2019 Time ECG obtained: 1748 PM Rate: 108 bpm Rhythm: sinus tachycardia Intervals: PR 146 ms. QTc 469 ms.  ST segment and T wave changes: No evidence of ST segment elevation or  depression Comparison: Similar to previous tracing obtained on 10/04/2009.   RADIOLOGY: DG Chest 1 View  Result Date: 06/25/2019 CLINICAL DATA:  Question pneumothorax EXAM: CHEST  1 VIEW COMPARISON:  06/25/2019 FINDINGS: Left lateral decubitus views were obtained. The lung fields are clear. No abnormal lucency on the left. IMPRESSION: Note that expiratory or right lateral decubitus views were suggested, however the previously noted left lateral lucency on the previous images is not visualized on the current views and the findings on previous exam are felt secondary to artifact. Electronically Signed   By: Donavan Foil M.D.   On: 06/25/2019 19:30   DG Chest 2 View  Result Date: 06/25/2019 CLINICAL DATA:  Chest pain EXAM: CHEST - 2 VIEW COMPARISON:  03/12/2018 FINDINGS: The heart size and mediastinal contours are within normal limits. Both lungs are clear. The visualized skeletal structures are unremarkable. Lucency at the left lower lateral lung suspected to be secondary to soft tissue artifact. IMPRESSION: 1. No acute pulmonary opacity.  2. Focal lucency at the left lateral lower lung, suspected artifact as this would be unusual location for a focal pneumothorax. Radiographic follow-up is suggested. Electronically Signed   By: Jasmine Pang M.D.   On: 06/25/2019 18:33    PROCEDURES: Procedures  MEDICATIONS RECEIVED THIS VISIT: Medications - No data to display  PERTINENT CLINICAL COURSE NOTES/UPDATES:   Initial Impression / Assessment and Plan / Urgent Care Course:  Pertinent labs & imaging results that were available during my care of the patient were personally reviewed by me and considered in my medical decision making (see lab/imaging section of note for values and interpretations).  ALIEAH BRINTON is a 64 y.o. female who presents to [facility] today with complaints of Chest Pain  Patient is well appearing overall in clinic today. She does not appear to be in any acute distress.  Presenting symptoms (see HPI) and exam as documented above. Patient with pressure and tightness in her chest. (+) SOB. She  Has risk factors for ACS, thus workup will be pursued here in clinic.  EKG shows ST with no elevation or depression. No leukocytosis. Renal function normal. Troponin normal. Initial CXR revealed area of concern in the patient's LLL; ? soft tissue artifact versus focal PTX. Spoke with radiologist Maeola Sarah, MD) who recommended repeat single view CXR (decubitus) to reassess. Repeat film revealed resolution of area of concern, thus confirming suspected soft tissue artifact. Results reviewed with patient. Suspect acute asthma exacerbation in the setting of URI. Patient has seasonal allergies; takes multiple interventions. Treating with 7 day course of oral doxycycline and a short steroid burst. Aware that steroids will elevate CBGs; patient to monitor diet and increase water intake while on steroids. Patient to continue prescribed MDIs.   Discussed follow up with primary care physician in 1 week for re-evaluation, or sooner if not improving. I have reviewed the follow up and strict return precautions for any new or worsening symptoms. Patient is aware of symptoms that would be deemed urgent/emergent, and would thus require further evaluation either here or in the emergency department. At the time of discharge, she verbalized understanding and consent with the discharge plan as it was reviewed with her. All questions were fielded by provider and/or clinic staff prior to patient discharge.    Final Clinical Impressions / Urgent Care Diagnoses:   Final diagnoses:  Mild intermittent asthma with acute exacerbation  Shortness of breath  History of seasonal allergies  Encounter for laboratory testing for COVID-19 virus    New Prescriptions:  [place] Controlled Substance Registry consulted? Not Applicable  Meds ordered this encounter  Medications  . doxycycline (VIBRA-TABS) 100 MG tablet     Sig: Take 1 tablet (100 mg total) by mouth 2 (two) times daily for 7 days.    Dispense:  14 tablet    Refill:  0  . predniSONE (DELTASONE) 20 MG tablet    Sig: Take 2 tablets (40 mg total) by mouth daily.    Dispense:  8 tablet    Refill:  0    Recommended Follow up Care:  Patient encouraged to follow up with the following provider within the specified time frame, or sooner as dictated by the severity of her symptoms. As always, she was instructed that for any urgent/emergent care needs, she should seek care either here or in the emergency department for more immediate evaluation.  Follow-up Information    Margaretann Loveless, PA-C In 1 week.   Specialty: Family Medicine Why: General reassessment  of symptoms if not improving Contact information: [address] [place] [address] [phone]         NOTE: This note was prepared using Dragon dictation software along with smaller phrase technology. Despite my best ability to proofread, there is the potential that transcriptional errors may still occur from this process, and are completely unintentional.    Verlee Monte, NP 06/26/19 1836

## 2019-07-04 ENCOUNTER — Other Ambulatory Visit: Payer: Self-pay | Admitting: Physician Assistant

## 2019-07-04 DIAGNOSIS — E119 Type 2 diabetes mellitus without complications: Secondary | ICD-10-CM

## 2019-07-04 NOTE — Telephone Encounter (Signed)
Requested Prescriptions  Pending Prescriptions Disp Refills  . glipiZIDE (GLUCOTROL XL) 10 MG 24 hr tablet [Pharmacy Med Name: GLIPIZIDE ER 10 MG TABLET] 90 tablet 0    Sig: TAKE 1 TABLET (10 MG TOTAL) BY MOUTH DAILY WITH BREAKFAST.     Endocrinology:  Diabetes - Sulfonylureas Failed - 07/04/2019  9:49 AM      Failed - HBA1C is between 0 and 7.9 and within 180 days    Hemoglobin A1C  Date Value Ref Range Status  12/12/2018 7.9 (A) 4.0 - 5.6 % Final   Hgb A1c MFr Bld  Date Value Ref Range Status  03/12/2018 8.8 (H) 4.8 - 5.6 % Final    Comment:             Prediabetes: 5.7 - 6.4          Diabetes: >6.4          Glycemic control for adults with diabetes: <7.0          Passed - Valid encounter within last 6 months    Recent Outpatient Visits          4 months ago Moderate persistent asthmatic bronchitis with acute exacerbation   [facility] [place], Victorino Dike M, PA-C   6 months ago Type 2 diabetes mellitus without complication, without long-term current use of insulin [facility])   [facility] [place], [place], [place]   7 months ago Moderate persistent asthmatic bronchitis with acute exacerbation   [facility] [place], [place], [place]   1 year ago Type 2 diabetes mellitus without complication, without long-term current use of insulin [facility])   [facility] [place], [place], [place]   1 year ago Type 2 diabetes mellitus without complication, without long-term current use of insulin [facility])   [facility] [place], [place], [place]

## 2019-07-31 ENCOUNTER — Other Ambulatory Visit: Payer: Self-pay | Admitting: Physician Assistant

## 2019-07-31 DIAGNOSIS — J4541 Moderate persistent asthma with (acute) exacerbation: Secondary | ICD-10-CM

## 2019-07-31 NOTE — Telephone Encounter (Signed)
Requested medication (s) are due for refill today: yes  Requested medication (s) are on the active medication list: yes  Last refill:  01/07/2018  Future visit scheduled:no  Notes to clinic:  Medication not assigned to a protocol, review manually.   Requested Prescriptions  Pending Prescriptions Disp Refills   INCRUSE ELLIPTA 62.5 MCG/INH AEPB [Pharmacy Med Name: INCRUSE ELLIPTA 62.5 MCG INH] 30 each 11    Sig: TAKE 1 PUFF BY MOUTH EVERY DAY      Off-Protocol Failed - 07/31/2019  1:11 PM      Failed - Medication not assigned to a protocol, review manually.      Passed - Valid encounter within last 12 months    Recent Outpatient Visits           5 months ago Moderate persistent asthmatic bronchitis with acute exacerbation   [facility] Joycelyn Man [place], [place]   7 months ago Type 2 diabetes mellitus without complication, without long-term current use of insulin [facility])   [facility] [place], [place], [place]   7 months ago Moderate persistent asthmatic bronchitis with acute exacerbation   [facility] [place], [place], [place]   1 year ago Type 2 diabetes mellitus without complication, without long-term current use of insulin [facility])   [facility] [place], [place], [place]   1 year ago Type 2 diabetes mellitus without complication, without long-term current use of insulin [facility])   [facility], [place], [place]

## 2019-08-28 ENCOUNTER — Ambulatory Visit (INDEPENDENT_AMBULATORY_CARE_PROVIDER_SITE_OTHER): Payer: Medicare HMO | Admitting: Physician Assistant

## 2019-08-28 ENCOUNTER — Encounter: Payer: Self-pay | Admitting: Physician Assistant

## 2019-08-28 ENCOUNTER — Other Ambulatory Visit: Payer: Self-pay

## 2019-08-28 VITALS — BP 143/85 | HR 108 | Temp 97.0°F | Resp 16 | Wt 252.6 lb

## 2019-08-28 DIAGNOSIS — E1165 Type 2 diabetes mellitus with hyperglycemia: Secondary | ICD-10-CM

## 2019-08-28 DIAGNOSIS — F319 Bipolar disorder, unspecified: Secondary | ICD-10-CM

## 2019-08-28 DIAGNOSIS — Z6841 Body Mass Index (BMI) 40.0 and over, adult: Secondary | ICD-10-CM | POA: Diagnosis not present

## 2019-08-28 DIAGNOSIS — R69 Illness, unspecified: Secondary | ICD-10-CM | POA: Diagnosis not present

## 2019-08-28 DIAGNOSIS — N3281 Overactive bladder: Secondary | ICD-10-CM

## 2019-08-28 DIAGNOSIS — N3946 Mixed incontinence: Secondary | ICD-10-CM | POA: Diagnosis not present

## 2019-08-28 LAB — POCT GLYCOSYLATED HEMOGLOBIN (HGB A1C)
Est. average glucose Bld gHb Est-mCnc: 252
Hemoglobin A1C: 10.4 % — AB (ref 4.0–5.6)

## 2019-08-28 MED ORDER — OXYBUTYNIN CHLORIDE ER 10 MG PO TB24: 10.0000 mg | ORAL_TABLET | Freq: Every day | ORAL | 3 refills | Status: DC

## 2019-08-28 MED ORDER — TRULICITY 1.5 MG/0.5ML ~~LOC~~ SOAJ: 1.5000 mg | SUBCUTANEOUS | 4 refills | Status: DC

## 2019-08-28 NOTE — Progress Notes (Signed)
Established patient visit   Patient: Joanna Bishop   DOB: 1955-04-07   64 y.o. Female  MRN: [id number] Visit Date: 08/28/2019  Today's healthcare provider: Margaretann Loveless, PA-C   Chief Complaint  Patient presents with  . Urinary Incontinence   Subjective    Urinary Incontinence Patient complains of urinary incontinence. This has been present for 1 year. She leaks urine with bending, coughing, lifting, standing, walking, laughing, sneezing, exercise, with a full bladder, during the night. Patient describes the symptoms as frequent urination (severalx per day), urge to urinate with little or no warning, urine leakage with coughing/heavy physical activity, urine leaking unpredictably and voiding small amounts. Factors associated with symptoms include anxiety.Reports that symptoms have worsen in the past 6 months. She does have hx of bladder problems after having her last child who is 33 years old.   Migraines: She has been having migraines headaches for the past two months. Reports having them daily and feels is because of the heat. Report that she has been taking sudafed and is helping.   Patient Active Problem List   Diagnosis Date Noted  . Class 3 severe obesity due to excess calories with serious comorbidity and body mass index (BMI) of 40.0 to 44.9 in adult (HCC) 08/28/2019  . Stiffness of right knee 06/18/2018  . Type 2 diabetes mellitus without complication, without long-term current use of insulin (HCC) 10/17/2015  . Carpal tunnel syndrome of left wrist 10/14/2015  . Bipolar depression (HCC) 07/12/2015  . 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 AB (asthmatic bronchitis) 08/02/2014  . Hypercholesteremia 08/02/2014  . History of suicidal ideation 08/02/2014  . Avitaminosis D 08/02/2014  . Generalized hyperhidrosis 08/02/2014  . Airway hyperreactivity 08/02/2014  . Anxiety disorder 08/02/2014  . Insomnia 08/02/2014  . Major depressive disorder, single episode  08/02/2014   Past Medical History:  Diagnosis Date  . Anxiety   . Asthma   . Depression   . Diabetes mellitus without complication (HCC)        Medications: Outpatient Medications Prior to Visit  Medication Sig  . albuterol (VENTOLIN HFA) 108 (90 Base) MCG/ACT inhaler INHALE 1-2 PUFFS INTO THE LUNGS EVERY 4 TO 6 HOURS AS NEEDED FOR WHEEZING OR SHORTNESS OF BREATH.  Marland Kitchen aspirin 81 MG tablet Take by mouth daily.  . benztropine (COGENTIN) 0.5 MG tablet Take 1 tablet (0.5 mg total) by mouth 2 (two) times daily.  . calcium carbonate (OS-CAL) 600 MG TABS tablet Take by mouth.  . Calcium-Magnesium-Vitamin D (CALCIUM 500 PO) Take 1 tablet by mouth 2 (two) times daily.  . divalproex (DEPAKOTE ER) 500 MG 24 hr tablet TAKE 2 TABLETS (1,000 MG TOTAL) BY MOUTH AT BEDTIME.  . fluticasone (FLONASE) 50 MCG/ACT nasal spray Place 2 sprays into the nose daily.  Marland Kitchen glipiZIDE (GLUCOTROL XL) 10 MG 24 hr tablet TAKE 1 TABLET (10 MG TOTAL) BY MOUTH DAILY WITH BREAKFAST.  Marland Kitchen INCRUSE ELLIPTA 62.5 MCG/INH AEPB TAKE 1 PUFF BY MOUTH EVERY DAY  . montelukast (SINGULAIR) 10 MG tablet Take 1 tablet (10 mg total) by mouth daily.  . Multiple Vitamin (MULTI-VITAMIN) tablet Take by mouth.  . NON FORMULARY Bladder Control Supplement Capsule  . traMADol (ULTRAM) 50 MG tablet Take by mouth every 6 (six) hours as needed.  . [DISCONTINUED] metFORMIN (GLUCOPHAGE) 500 MG tablet Take 1 tablet (500 mg total) by mouth 2 (two) times daily with a meal.  . [DISCONTINUED] predniSONE (DELTASONE) 20 MG tablet Take 2 tablets (40 mg  total) by mouth daily.   No facility-administered medications prior to visit.    Review of Systems  Constitutional: Negative.   Respiratory: Negative.   Cardiovascular: Negative for chest pain, palpitations and leg swelling.  Gastrointestinal: Negative.   Genitourinary: Positive for enuresis, frequency and urgency.  Neurological: Positive for headaches ("migraines in the past 2 months"). Negative for  dizziness.    Last CBC Lab Results  Component Value Date   WBC 9.5 06/25/2019   HGB 14.2 06/25/2019   HCT 41.6 06/25/2019   MCV 87.4 06/25/2019   MCH 29.8 06/25/2019   RDW 12.7 06/25/2019   PLT 195 06/25/2019   Last metabolic panel Lab Results  Component Value Date   GLUCOSE 337 (H) 06/25/2019   NA 135 06/25/2019   K 4.4 06/25/2019   CL 101 06/25/2019   CO2 24 06/25/2019   BUN 24 (H) 06/25/2019   CREATININE 0.99 06/25/2019   GFRNONAA >60 06/25/2019   GFRAA >60 06/25/2019   CALCIUM 9.5 06/25/2019   PROT 6.5 03/12/2018   ALBUMIN 4.2 03/12/2018   LABGLOB 2.3 03/12/2018   AGRATIO 1.8 03/12/2018   BILITOT 0.4 03/12/2018   ALKPHOS 95 03/12/2018   AST 34 03/12/2018   ALT 45 (H) 03/12/2018   ANIONGAP 10 06/25/2019      Objective    BP (!) 143/85 (BP Location: Left Arm, Patient Position: Sitting, Cuff Size: Large)   Pulse (!) 108   Temp (!) 97 F (36.1 C) (Temporal)   Resp 16   Wt 252 lb 9.6 oz (114.6 kg)   BMI 42.69 kg/m  BP Readings from Last 3 Encounters:  08/28/19 (!) 143/85  06/25/19 (!) 178/100  12/12/18 (!) 134/92   Wt Readings from Last 3 Encounters:  08/28/19 252 lb 9.6 oz (114.6 kg)  06/25/19 240 lb (108.9 kg)  02/10/19 250 lb (113.4 kg)      Physical Exam Vitals reviewed.  Constitutional:      General: She is not in acute distress.    Appearance: Normal appearance. She is well-developed. She is obese. She is not ill-appearing or diaphoretic.  Neck:     Thyroid: No thyromegaly.     Vascular: No JVD.     Trachea: No tracheal deviation.  Cardiovascular:     Rate and Rhythm: Normal rate and regular rhythm.     Pulses: Normal pulses.     Heart sounds: Normal heart sounds. No murmur heard.  No friction rub. No gallop.   Pulmonary:     Effort: Pulmonary effort is normal. No respiratory distress.     Breath sounds: Normal breath sounds. No wheezing or rales.  Musculoskeletal:     Cervical back: Normal range of motion and neck supple.     Right  lower leg: No edema.     Left lower leg: No edema.  Lymphadenopathy:     Cervical: No cervical adenopathy.  Skin:    General: Skin is warm and dry.  Neurological:     Mental Status: She is alert.       Results for orders placed or performed in visit on 08/28/19  POCT glycosylated hemoglobin (Hb A1C)  Result Value Ref Range   Hemoglobin A1C 10.4 (A) 4.0 - 5.6 %   Est. average glucose Bld gHb Est-mCnc 252     Assessment & Plan     1. Type 2 diabetes mellitus with hyperglycemia, without long-term current use of insulin (HCC) Uncontrolled and suspect this can be contributing to worsening of symptoms she  is experiencing. Continue Glipizide XR 10mg . Add Trulicity 1.5mg  SQ weekly as below. F/U in 3 months.  - Dulaglutide (TRULICITY) 1.5 MG/0.5ML SOPN; Inject 0.5 mLs (1.5 mg total) into the skin once a week.  Dispense: 3 mL; Refill: 4  2. OAB (overactive bladder) Worsening, most likely due to uncontrolled diabetes, but was having prior as well. Will start Oxybutynin XL 10mg  at bedtime as below. F/U in 1-3 months.  - oxybutynin (DITROPAN XL) 10 MG 24 hr tablet; Take 1 tablet (10 mg total) by mouth at bedtime.  Dispense: 90 tablet; Refill: 3  3. Mixed stress and urge urinary incontinence See above medical treatment plan. - oxybutynin (DITROPAN XL) 10 MG 24 hr tablet; Take 1 tablet (10 mg total) by mouth at bedtime.  Dispense: 90 tablet; Refill: 3  4. Bipolar depression (HCC) Stable on current medications.   5. Class 3 severe obesity due to excess calories with serious comorbidity and body mass index (BMI) of 40.0 to 44.9 in adult [facility]) Counseled patient on healthy lifestyle modifications including dieting and exercise.    No follow-ups on file.      , PA-C, have reviewed all documentation for this visit. The documentation on 09/06/19 for the exam, diagnosis, procedures, and orders are all accurate and complete.   Delmer Islam  [facility] [phone] (phone) [phone] (fax)  [facility]

## 2019-08-28 NOTE — Patient Instructions (Signed)
Overactive Bladder, Adult  Overactive bladder refers to a condition in which a person has a sudden need to pass urine. The person may leak urine if he or she cannot get to the bathroom fast enough (urinary incontinence). A person with this condition may also wake up several times in the night to go to the bathroom. Overactive bladder is associated with poor nerve signals between your bladder and your brain. Your bladder may get the signal to empty before it is full. You may also have very sensitive muscles that make your bladder squeeze too soon. These symptoms might interfere with daily work or social activities. What are the causes? This condition may be associated with or caused by:  Urinary tract infection.  Infection of nearby tissues, such as the prostate.  Prostate enlargement.  Surgery on the uterus or urethra.  Bladder stones, inflammation, or tumors.  Drinking too much caffeine or alcohol.  Certain medicines, especially medicines that get rid of extra fluid in the body (diuretics).  Muscle or nerve weakness, especially from: ? A spinal cord injury. ? Stroke. ? Multiple sclerosis. ? Parkinson's disease.  Diabetes.  Constipation. What increases the risk? You may be at greater risk for overactive bladder if you:  Are an older adult.  Smoke.  Are going through menopause.  Have prostate problems.  Have a neurological disease, such as stroke, dementia, Parkinson's disease, or multiple sclerosis (MS).  Eat or drink things that irritate the bladder. These include alcohol, spicy food, and caffeine.  Are overweight or obese. What are the signs or symptoms? Symptoms of this condition include:  Sudden, strong urge to urinate.  Leaking urine.  Urinating 8 or more times a day.  Waking up to urinate 2 or more times a night. How is this diagnosed? Your health care provider may suspect overactive bladder based on your symptoms. He or she will diagnose this condition  by:  A physical exam and medical history.  Blood or urine tests. You might need bladder or urine tests to help determine what is causing your overactive bladder. You might also need to see a health care provider who specializes in urinary tract problems (urologist). How is this treated? Treatment for overactive bladder depends on the cause of your condition and whether it is mild or severe. You can also make lifestyle changes at home. Options include:  Bladder training. This may include: ? Learning to control the urge to urinate by following a schedule that directs you to urinate at regular intervals (timed voiding). ? Doing Kegel exercises to strengthen your pelvic floor muscles, which support your bladder. Toning these muscles can help you control urination, even if your bladder muscles are overactive.  Special devices. This may include: ? Biofeedback, which uses sensors to help you become aware of your body's signals. ? Electrical stimulation, which uses electrodes placed inside the body (implanted) or outside the body. These electrodes send gentle pulses of electricity to strengthen the nerves or muscles that control the bladder. ? Women may use a plastic device that fits into the vagina and supports the bladder (pessary).  Medicines. ? Antibiotics to treat bladder infection. ? Antispasmodics to stop the bladder from releasing urine at the wrong time. ? Tricyclic antidepressants to relax bladder muscles. ? Injections of botulinum toxin type A directly into the bladder tissue to relax bladder muscles.  Lifestyle changes. This may include: ? Weight loss. Talk to your health care provider about weight loss methods that would work best for you. ?   Diet changes. This may include reducing how much alcohol and caffeine you consume, or drinking fluids at different times of the day. ? Not smoking. Do not use any products that contain nicotine or tobacco, such as cigarettes and e-cigarettes. If  you need help quitting, ask your health care provider.  Surgery. ? A device may be implanted to help manage the nerve signals that control urination. ? An electrode may be implanted to stimulate electrical signals in the bladder. ? A procedure may be done to change the shape of the bladder. This is done only in very severe cases. Follow these instructions at home: Lifestyle  Make any diet or lifestyle changes that are recommended by your health care provider. These may include: ? Drinking less fluid or drinking fluids at different times of the day. ? Cutting down on caffeine or alcohol. ? Doing Kegel exercises. ? Losing weight if needed. ? Eating a healthy and balanced diet to prevent constipation. This may include:  Eating foods that are high in fiber, such as fresh fruits and vegetables, whole grains, and beans.  Limiting foods that are high in fat and processed sugars, such as fried and sweet foods. General instructions  Take over-the-counter and prescription medicines only as told by your health care provider.  If you were prescribed an antibiotic medicine, take it as told by your health care provider. Do not stop taking the antibiotic even if you start to feel better.  Use any implants or pessary as told by your health care provider.  If needed, wear pads to absorb urine leakage.  Keep a journal or log to track how much and when you drink and when you feel the need to urinate. This will help your health care provider monitor your condition.  Keep all follow-up visits as told by your health care provider. This is important. Contact a health care provider if:  You have a fever.  Your symptoms do not get better with treatment.  Your pain and discomfort get worse.  You have more frequent urges to urinate. Get help right away if:  You are not able to control your bladder. Summary  Overactive bladder refers to a condition in which a person has a sudden need to pass  urine.  Several conditions may lead to an overactive bladder.  Treatment for overactive bladder depends on the cause and severity of your condition.  Follow your health care provider's instructions about lifestyle changes, doing Kegel exercises, keeping a journal, and taking medicines. This information is not intended to replace advice given to you by your health care provider. Make sure you discuss any questions you have with your health care provider. Document Revised: 06/05/2018 Document Reviewed: 02/28/2017 Elsevier Patient Education  2020 [employer] Oxybutynin extended-release tablets What is this medicine? OXYBUTYNIN (ox i BYOO ti nin) is used to treat overactive bladder. This medicine reduces the amount of bathroom visits. It may also help to control wetting accidents. This medicine may be used for other purposes; ask your health care provider or pharmacist if you have questions. COMMON BRAND NAME(S): Ditropan XL What should I tell my health care provider before I take this medicine? They need to know if you have any of these conditions:  autonomic neuropathy  dementia  difficulty passing urine  glaucoma  intestinal obstruction  kidney disease  liver disease  myasthenia gravis  Parkinson's disease  an unusual or allergic reaction to oxybutynin, other medicines, foods, dyes, or preservatives  pregnant or trying to get  pregnant  breast-feeding How should I use this medicine? Take this medicine by mouth with a glass of water. Swallow whole, do not crush, cut, or chew. Follow the directions on the prescription label. You can take this medicine with or without food. Take your doses at regular intervals. Do not take your medicine more often than directed. Talk to your pediatrician regarding the use of this medicine in children. Special care may be needed. While this drug may be prescribed for children as young as 6 years for selected conditions, precautions do  apply. Overdosage: If you think you have taken too much of this medicine contact a poison control center or emergency room at once. NOTE: This medicine is only for you. Do not share this medicine with others. What if I miss a dose? If you miss a dose, take it as soon as you can. If it is almost time for your next dose, take only that dose. Do not take double or extra doses. What may interact with this medicine?  antihistamines for allergy, cough and cold  atropine  certain medicines for bladder problems like oxybutynin, tolterodine  certain medicines for Parkinson's disease like benztropine, trihexyphenidyl  certain medicines for stomach problems like dicyclomine, hyoscyamine  certain medicines for travel sickness like scopolamine  clarithromycin  erythromycin  ipratropium  medicines for fungal infections, like fluconazole, itraconazole, ketoconazole or voriconazole This list may not describe all possible interactions. Give your health care provider a list of all the medicines, herbs, non-prescription drugs, or dietary supplements you use. Also tell them if you smoke, drink alcohol, or use illegal drugs. Some items may interact with your medicine. What should I watch for while using this medicine? It may take a few weeks to notice the full benefit from this medicine. You may need to limit your intake tea, coffee, caffeinated sodas, and alcohol. These drinks may make your symptoms worse. You may get drowsy or dizzy. Do not drive, use machinery, or do anything that needs mental alertness until you know how this medicine affects you. Do not stand or sit up quickly, especially if you are an older patient. This reduces the risk of dizzy or fainting spells. Alcohol may interfere with the effect of this medicine. Avoid alcoholic drinks. Your mouth may get dry. Chewing sugarless gum or sucking hard candy, and drinking plenty of water may help. Contact your doctor if the problem does not go  away or is severe. This medicine may cause dry eyes and blurred vision. If you wear contact lenses, you may feel some discomfort. Lubricating drops may help. See your eyecare professional if the problem does not go away or is severe. You may notice the shells of the tablets in your stool from time to time. This is normal. Avoid extreme heat. This medicine can cause you to sweat less than normal. Your body temperature could increase to dangerous levels, which may lead to heat stroke. What side effects may I notice from receiving this medicine? Side effects that you should report to your doctor or health care professional as soon as possible:  allergic reactions like skin rash, itching or hives, swelling of the face, lips, or tongue  agitation  breathing problems  confusion  fever  flushing (reddening of the skin)  hallucinations  memory loss  pain or difficulty passing urine  palpitations  unusually weak or tired Side effects that usually do not require medical attention (report to your doctor or health care professional if they continue or are bothersome):  constipation  headache  sexual difficulties (impotence) This list may not describe all possible side effects. Call your doctor for medical advice about side effects. You may report side effects to FDA at 1-800-FDA-1088. Where should I keep my medicine? Keep out of the reach of children. Store at room temperature between 15 and 30 degrees C (59 and 86 degrees F). Protect from moisture and humidity. Throw away any unused medicine after the expiration date. NOTE: This sheet is a summary. It may not cover all possible information. If you have questions about this medicine, talk to your doctor, pharmacist, or health care provider.  2020 Elsevier/Gold Standard (2013-04-30 10:57:06) Dulaglutide injection What is this medicine? DULAGLUTIDE (DOO la GLOO tide) is used to improve blood sugar control in adults with type 2 diabetes.  This medicine may be used with other oral diabetes medicines. This drug may also reduce the risk of heart attack or stroke if you have type 2 diabetes and risk factors for heart disease. This medicine may be used for other purposes; ask your health care provider or pharmacist if you have questions. COMMON BRAND NAME(S): Trulicity What should I tell my health care provider before I take this medicine? They need to know if you have any of these conditions:  endocrine tumors (MEN 2) or if someone in your family had these tumors  eye disease, vision problems  history of pancreatitis  kidney disease  liver disease  stomach problems  thyroid cancer or if someone in your family had thyroid cancer  an unusual or allergic reaction to dulaglutide, other medicines, foods, dyes, or preservatives  pregnant or trying to get pregnant  breast-feeding How should I use this medicine? This medicine is for injection under the skin of your upper leg (thigh), stomach area, or upper arm. It is usually given once every week (every 7 days). You will be taught how to prepare and give this medicine. Use exactly as directed. Take your medicine at regular intervals. Do not take it more often than directed. If you use this medicine with insulin, you should inject this medicine and the insulin separately. Do not mix them together. Do not give the injections right next to each other. Change (rotate) injection sites with each injection. It is important that you put your used needles and syringes in a special sharps container. Do not put them in a trash can. If you do not have a sharps container, call your pharmacist or healthcare provider to get one. A special MedGuide will be given to you by the pharmacist with each prescription and refill. Be sure to read this information carefully each time. This drug comes with INSTRUCTIONS FOR USE. Ask your pharmacist for directions on how to use this drug. Read the information  carefully. Talk to your pharmacist or health care provider if you have questions. Talk to your pediatrician regarding the use of this medicine in children. Special care may be needed. Overdosage: If you think you have taken too much of this medicine contact a poison control center or emergency room at once. NOTE: This medicine is only for you. Do not share this medicine with others. What if I miss a dose? If you miss a dose, take it as soon as you can within 3 days after the missed dose. Then take your next dose at your regular weekly time. If it has been longer than 3 days after the missed dose, do not take the missed dose. Take the next dose at your regular time.  Do not take double or extra doses. If you have questions about a missed dose, contact your health care provider for advice. What may interact with this medicine?  other medicines for diabetes Many medications may cause changes in blood sugar, these include:  alcohol containing beverages  antiviral medicines for HIV or AIDS  aspirin and aspirin-like drugs  certain medicines for blood pressure, heart disease, irregular heart beat  chromium  diuretics  female hormones, such as estrogens or progestins, birth control pills  fenofibrate  gemfibrozil  isoniazid  lanreotide  female hormones or anabolic steroids  MAOIs like Carbex, Eldepryl, Marplan, Nardil, and Parnate  medicines for weight loss  medicines for allergies, asthma, cold, or cough  medicines for depression, anxiety, or psychotic disturbances  niacin  nicotine  NSAIDs, medicines for pain and inflammation, like ibuprofen or naproxen  octreotide  pasireotide  pentamidine  phenytoin  probenecid  quinolone antibiotics such as ciprofloxacin, levofloxacin, ofloxacin  some herbal dietary supplements  steroid medicines such as prednisone or cortisone  sulfamethoxazole; trimethoprim  thyroid hormones Some medications can hide the warning  symptoms of low blood sugar (hypoglycemia). You may need to monitor your blood sugar more closely if you are taking one of these medications. These include:  beta-blockers, often used for high blood pressure or heart problems (examples include atenolol, metoprolol, propranolol)  clonidine  guanethidine  reserpine This list may not describe all possible interactions. Give your health care provider a list of all the medicines, herbs, non-prescription drugs, or dietary supplements you use. Also tell them if you smoke, drink alcohol, or use illegal drugs. Some items may interact with your medicine. What should I watch for while using this medicine? Visit your doctor or health care professional for regular checks on your progress. Drink plenty of fluids while taking this medicine. Check with your doctor or health care professional if you get an attack of severe diarrhea, nausea, and vomiting. The loss of too much body fluid can make it dangerous for you to take this medicine. A test called the HbA1C (A1C) will be monitored. This is a simple blood test. It measures your blood sugar control over the last 2 to 3 months. You will receive this test every 3 to 6 months. Learn how to check your blood sugar. Learn the symptoms of low and high blood sugar and how to manage them. Always carry a quick-source of sugar with you in case you have symptoms of low blood sugar. Examples include hard sugar candy or glucose tablets. Make sure others know that you can choke if you eat or drink when you develop serious symptoms of low blood sugar, such as seizures or unconsciousness. They must get medical help at once. Tell your doctor or health care professional if you have high blood sugar. You might need to change the dose of your medicine. If you are sick or exercising more than usual, you might need to change the dose of your medicine. Do not skip meals. Ask your doctor or health care professional if you should avoid  alcohol. Many nonprescription cough and cold products contain sugar or alcohol. These can affect blood sugar. Pens should never be shared. Even if the needle is changed, sharing may result in passing of viruses like hepatitis or HIV. Wear a medical ID bracelet or chain, and carry a card that describes your disease and details of your medicine and dosage times. What side effects may I notice from receiving this medicine? Side effects that you should  report to your doctor or health care professional as soon as possible:  allergic reactions like skin rash, itching or hives, swelling of the face, lips, or tongue  breathing problems  changes in vision  diarrhea that continues or is severe  lump or swelling on the neck  severe nausea  signs and symptoms of infection like fever or chills; cough; sore throat; pain or trouble passing urine  signs and symptoms of low blood sugar such as feeling anxious, confusion, dizziness, increased hunger, unusually weak or tired, sweating, shakiness, cold, irritable, headache, blurred vision, fast heartbeat, loss of consciousness  signs and symptoms of kidney injury like trouble passing urine or change in the amount of urine  trouble swallowing  unusual stomach upset or pain  vomiting Side effects that usually do not require medical attention (report to your doctor or health care professional if they continue or are bothersome):  diarrhea  loss of appetite  nausea  pain, redness, or irritation at site where injected  stomach upset This list may not describe all possible side effects. Call your doctor for medical advice about side effects. You may report side effects to FDA at 1-800-FDA-1088. Where should I keep my medicine? Keep out of the reach of children. Store unopened pens in a refrigerator between 2 and 8 degrees C (36 and 46 degrees F). Do not freeze or use if the medicine has been frozen. Protect from light and excessive heat. Store in  the carton until use. Each single-dose pen can be kept at room temperature, not to exceed 30 degrees C (86 degrees F) for a total of 14 days, if needed. Throw away any unused medicine after the expiration date on the label. NOTE: This sheet is a summary. It may not cover all possible information. If you have questions about this medicine, talk to your doctor, pharmacist, or health care provider.  2020 Elsevier/Gold Standard (2018-10-28 09:34:53)

## 2019-09-06 ENCOUNTER — Encounter: Payer: Self-pay | Admitting: Physician Assistant

## 2019-09-07 ENCOUNTER — Other Ambulatory Visit: Payer: Self-pay | Admitting: Physician Assistant

## 2019-09-07 DIAGNOSIS — E119 Type 2 diabetes mellitus without complications: Secondary | ICD-10-CM

## 2019-09-08 DIAGNOSIS — G8929 Other chronic pain: Secondary | ICD-10-CM | POA: Diagnosis not present

## 2019-09-08 DIAGNOSIS — J45909 Unspecified asthma, uncomplicated: Secondary | ICD-10-CM | POA: Diagnosis not present

## 2019-09-08 DIAGNOSIS — R32 Unspecified urinary incontinence: Secondary | ICD-10-CM | POA: Diagnosis not present

## 2019-09-08 DIAGNOSIS — E1136 Type 2 diabetes mellitus with diabetic cataract: Secondary | ICD-10-CM | POA: Diagnosis not present

## 2019-09-08 DIAGNOSIS — R69 Illness, unspecified: Secondary | ICD-10-CM | POA: Diagnosis not present

## 2019-09-08 DIAGNOSIS — Z7982 Long term (current) use of aspirin: Secondary | ICD-10-CM | POA: Diagnosis not present

## 2019-09-08 DIAGNOSIS — Z6841 Body Mass Index (BMI) 40.0 and over, adult: Secondary | ICD-10-CM | POA: Diagnosis not present

## 2019-09-08 DIAGNOSIS — M1990 Unspecified osteoarthritis, unspecified site: Secondary | ICD-10-CM | POA: Diagnosis not present

## 2019-09-08 DIAGNOSIS — K219 Gastro-esophageal reflux disease without esophagitis: Secondary | ICD-10-CM | POA: Diagnosis not present

## 2019-12-16 ENCOUNTER — Other Ambulatory Visit: Payer: Self-pay | Admitting: Physician Assistant

## 2019-12-16 DIAGNOSIS — E119 Type 2 diabetes mellitus without complications: Secondary | ICD-10-CM

## 2019-12-16 NOTE — Telephone Encounter (Signed)
Requested Prescriptions  Pending Prescriptions Disp Refills  . glipiZIDE (GLUCOTROL XL) 10 MG 24 hr tablet [Pharmacy Med Name: GLIPIZIDE ER 10 MG TABLET] 90 tablet 0    Sig: TAKE 1 TABLET (10 MG TOTAL) BY MOUTH DAILY WITH BREAKFAST.     Endocrinology:  Diabetes - Sulfonylureas Failed - 12/16/2019  1:13 AM      Failed - HBA1C is between 0 and 7.9 and within 180 days    Hemoglobin A1C  Date Value Ref Range Status  08/28/2019 10.4 (A) 4.0 - 5.6 % Final   Hgb A1c MFr Bld  Date Value Ref Range Status  03/12/2018 8.8 (H) 4.8 - 5.6 % Final    Comment:             Prediabetes: 5.7 - 6.4          Diabetes: >6.4          Glycemic control for adults with diabetes: <7.0          Passed - Valid encounter within last 6 months    Recent Outpatient Visits          3 months ago Type 2 diabetes mellitus with hyperglycemia, without long-term current use of insulin [facility])   [facility] [place], [place], [place]   10 months ago Moderate persistent asthmatic bronchitis with acute exacerbation   [facility] [place], [place], [place]   1 year ago Type 2 diabetes mellitus without complication, without long-term current use of insulin [facility])   [facility] [place], [place], [place]   1 year ago Moderate persistent asthmatic bronchitis with acute exacerbation   [facility] [place], [place], [place]   1 year ago Type 2 diabetes mellitus without complication, without long-term current use of insulin [facility])   [facility] [place], [place], [place]

## 2019-12-28 ENCOUNTER — Telehealth: Payer: Self-pay | Admitting: Physician Assistant

## 2019-12-28 NOTE — Telephone Encounter (Signed)
Glipizide was refilled on 12/16/19 to [facility].

## 2019-12-28 NOTE — Telephone Encounter (Signed)
Patient is needing advise - Does Joanna Bishop want her to stay on the glipiZIDE (GLUCOTROL XL) 10 MG 24 hr tablet [[id number]] CB- [phone] If so she is needing a refill. [facility] [address] [place]

## 2019-12-28 NOTE — Telephone Encounter (Signed)
Patient advised.

## 2020-01-18 ENCOUNTER — Telehealth (INDEPENDENT_AMBULATORY_CARE_PROVIDER_SITE_OTHER): Payer: Medicare HMO | Admitting: Physician Assistant

## 2020-01-18 ENCOUNTER — Encounter: Payer: Self-pay | Admitting: Physician Assistant

## 2020-01-18 DIAGNOSIS — B379 Candidiasis, unspecified: Secondary | ICD-10-CM

## 2020-01-18 DIAGNOSIS — J440 Chronic obstructive pulmonary disease with acute lower respiratory infection: Secondary | ICD-10-CM

## 2020-01-18 DIAGNOSIS — J0140 Acute pansinusitis, unspecified: Secondary | ICD-10-CM

## 2020-01-18 DIAGNOSIS — J209 Acute bronchitis, unspecified: Secondary | ICD-10-CM | POA: Diagnosis not present

## 2020-01-18 DIAGNOSIS — T3695XA Adverse effect of unspecified systemic antibiotic, initial encounter: Secondary | ICD-10-CM | POA: Diagnosis not present

## 2020-01-18 MED ORDER — DOXYCYCLINE HYCLATE 100 MG PO TABS: 100.0000 mg | ORAL_TABLET | Freq: Two times a day (BID) | ORAL | 0 refills | Status: DC

## 2020-01-18 MED ORDER — FLUCONAZOLE 150 MG PO TABS: 150.0000 mg | ORAL_TABLET | Freq: Once | ORAL | 0 refills | Status: AC

## 2020-01-18 MED ORDER — PREDNISONE 10 MG PO TABS: ORAL_TABLET | ORAL | 0 refills | Status: DC

## 2020-01-18 MED ORDER — HYDROCODONE-HOMATROPINE 5-1.5 MG/5ML PO SYRP: 5.0000 mL | ORAL_SOLUTION | Freq: Three times a day (TID) | ORAL | 0 refills | Status: DC | PRN

## 2020-01-18 NOTE — Patient Instructions (Signed)

## 2020-01-18 NOTE — Progress Notes (Signed)
MyChart Video Visit    Virtual Visit via Video Note   This visit type was conducted due to national recommendations for restrictions regarding the COVID-19 Pandemic (e.g. social distancing) in an effort to limit this patient's exposure and mitigate transmission in our community. This patient is at least at moderate risk for complications without adequate follow up. This format is felt to be most appropriate for this patient at this time. Physical exam was limited by quality of the video and audio technology used for the visit.   Patient location: Safely parked in car Provider location: [facility]  I discussed the limitations of evaluation and management by telemedicine and the availability of in person appointments. The patient expressed understanding and agreed to proceed.  Patient: Joanna Bishop   DOB: [birth year]   64 y.o. Female  MRN: [id number] Visit Date: 01/18/2020  Today's healthcare provider: Margaretann Loveless, PA-C   Chief Complaint  Patient presents with  . URI   Subjective    URI  This is a new problem. The current episode started in the past 7 days (started on Friday morning). The problem has been gradually worsening. There has been no fever. Associated symptoms include congestion, coughing and wheezing. Pertinent negatives include no ear pain, headaches, rhinorrhea, sinus pain, sneezing or sore throat. She has tried inhaler use and antihistamine for the symptoms. The treatment provided no relief.    Patient Active Problem List   Diagnosis Date Noted  . Class 3 severe obesity due to excess calories with serious comorbidity and body mass index (BMI) of 40.0 to 44.9 in adult (HCC) 08/28/2019  . Stiffness of right knee 06/18/2018  . Type 2 diabetes mellitus without complication, without long-term current use of insulin (HCC) 10/17/2015  . Carpal tunnel syndrome of left wrist 10/14/2015  . Bipolar depression (HCC) 07/12/2015  . 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  AB (asthmatic bronchitis) 08/02/2014  . Hypercholesteremia 08/02/2014  . History of suicidal ideation 08/02/2014  . Avitaminosis D 08/02/2014  . Generalized hyperhidrosis 08/02/2014  . Airway hyperreactivity 08/02/2014  . Anxiety disorder 08/02/2014  . Insomnia 08/02/2014  . Major depressive disorder, single episode 08/02/2014   Past Medical History:  Diagnosis Date  . Anxiety   . Asthma   . Depression   . Diabetes mellitus without complication (HCC)       Medications: Outpatient Medications Prior to Visit  Medication Sig  . albuterol (VENTOLIN HFA) 108 (90 Base) MCG/ACT inhaler INHALE 1-2 PUFFS INTO THE LUNGS EVERY 4 TO 6 HOURS AS NEEDED FOR WHEEZING OR SHORTNESS OF BREATH.  Marland Kitchen aspirin 81 MG tablet Take by mouth daily.  . benztropine (COGENTIN) 0.5 MG tablet Take 1 tablet (0.5 mg total) by mouth 2 (two) times daily.  . calcium carbonate (OS-CAL) 600 MG TABS tablet Take by mouth.  . Calcium-Magnesium-Vitamin D (CALCIUM 500 PO) Take 1 tablet by mouth 2 (two) times daily.  . divalproex (DEPAKOTE ER) 500 MG 24 hr tablet TAKE 2 TABLETS (1,000 MG TOTAL) BY MOUTH AT BEDTIME.  . Dulaglutide (TRULICITY) 1.5 MG/0.5ML SOPN Inject 0.5 mLs (1.5 mg total) into the skin once a week.  . fluticasone (FLONASE) 50 MCG/ACT nasal spray Place 2 sprays into the nose daily.  Marland Kitchen glipiZIDE (GLUCOTROL XL) 10 MG 24 hr tablet TAKE 1 TABLET (10 MG TOTAL) BY MOUTH DAILY WITH BREAKFAST.  Marland Kitchen INCRUSE ELLIPTA 62.5 MCG/INH AEPB TAKE 1 PUFF BY MOUTH EVERY DAY  . montelukast (SINGULAIR) 10 MG tablet Take 1 tablet (  10 mg total) by mouth daily.  . Multiple Vitamin (MULTI-VITAMIN) tablet Take by mouth.  . NON FORMULARY Bladder Control Supplement Capsule  . oxybutynin (DITROPAN XL) 10 MG 24 hr tablet Take 1 tablet (10 mg total) by mouth at bedtime.  . traMADol (ULTRAM) 50 MG tablet Take by mouth every 6 (six) hours as needed.   No facility-administered medications prior to visit.    Review of Systems  Constitutional:  Negative for fever.  HENT: Positive for congestion. Negative for ear pain, rhinorrhea, sinus pain, sneezing and sore throat.   Respiratory: Positive for cough, chest tightness, shortness of breath and wheezing.   Neurological: Negative for headaches.    Last CBC Lab Results  Component Value Date   WBC 9.5 06/25/2019   HGB 14.2 06/25/2019   HCT 41.6 06/25/2019   MCV 87.4 06/25/2019   MCH 29.8 06/25/2019   RDW 12.7 06/25/2019   PLT 195 06/25/2019   Last metabolic panel Lab Results  Component Value Date   GLUCOSE 337 (H) 06/25/2019   NA 135 06/25/2019   K 4.4 06/25/2019   CL 101 06/25/2019   CO2 24 06/25/2019   BUN 24 (H) 06/25/2019   CREATININE 0.99 06/25/2019   GFRNONAA >60 06/25/2019   GFRAA >60 06/25/2019   CALCIUM 9.5 06/25/2019   PROT 6.5 03/12/2018   ALBUMIN 4.2 03/12/2018   LABGLOB 2.3 03/12/2018   AGRATIO 1.8 03/12/2018   BILITOT 0.4 03/12/2018   ALKPHOS 95 03/12/2018   AST 34 03/12/2018   ALT 45 (H) 03/12/2018   ANIONGAP 10 06/25/2019      Objective    There were no vitals taken for this visit. BP Readings from Last 3 Encounters:  08/28/19 (!) 143/85  06/25/19 (!) 178/100  12/12/18 (!) 134/92   Wt Readings from Last 3 Encounters:  08/28/19 252 lb 9.6 oz (114.6 kg)  06/25/19 240 lb (108.9 kg)  02/10/19 250 lb (113.4 kg)      Physical Exam Vitals reviewed.  Constitutional:      Appearance: Normal appearance. She is well-developed.  HENT:     Head: Normocephalic and atraumatic.  Pulmonary:     Effort: Pulmonary effort is normal. No respiratory distress.     Comments: Deep, barky, dry cough heard. No significant SOB. Able to speak in full sentences wihtout issue. Musculoskeletal:     Cervical back: Normal range of motion and neck supple.  Neurological:     Mental Status: She is alert.  Psychiatric:        Mood and Affect: Mood normal.        Behavior: Behavior normal.        Thought Content: Thought content normal.        Judgment: Judgment  normal.       Assessment & Plan     1. Acute bronchitis with COPD (HCC) Worsening. Will treat with prednisone and Hycodan cough syrup. Push fluids. Rest. Call if worsening.  - predniSONE (DELTASONE) 10 MG tablet; Take 6 tablets PO on day 1 and day 2, take 5 tablets PO on day 3 and day 4, take 4 tablets PO on day 5 and day 6, take 3 tablets PO on day 7 and day 8, take 2 tablets PO on day 9 and day 10, take one tablet PO on day 11 and day 12.  Dispense: 42 tablet; Refill: 0 - HYDROcodone-homatropine (HYCODAN) 5-1.5 MG/5ML syrup; Take 5 mLs by mouth every 8 (eight) hours as needed for cough.  Dispense: 120  mL; Refill: 0  2. Acute non-recurrent pansinusitis Worsening symptoms that have not responded to OTC medications. Will give Doxycycline as below. Continue allergy medications. Stay well hydrated and get plenty of rest. Call if no symptom improvement or if symptoms worsen. - doxycycline (VIBRA-TABS) 100 MG tablet; Take 1 tablet (100 mg total) by mouth 2 (two) times daily.  Dispense: 20 tablet; Refill: 0  3. Antibiotic-induced yeast infection Gets yeast infections with antibiotics. Diflucan given as below.  - fluconazole (DIFLUCAN) 150 MG tablet; Take 1 tablet (150 mg total) by mouth once for 1 dose.  Dispense: 1 tablet; Refill: 0   No follow-ups on file.     I discussed the assessment and treatment plan with the patient. The patient was provided an opportunity to ask questions and all were answered. The patient agreed with the plan and demonstrated an understanding of the instructions.   The patient was advised to call back or seek an in-person evaluation if the symptoms worsen or if the condition fails to improve as anticipated.  I provided 8 minutes of face-to-face time during this encounter via MyChart Video enabled encounter.   Delmer Islam, PA-C, have reviewed all documentation for this visit. The documentation on 01/18/20 for the exam, diagnosis, procedures, and orders  are all accurate and complete.   Reine Just [facility] [phone] (phone) [phone] (fax)  [facility]

## 2020-01-20 ENCOUNTER — Other Ambulatory Visit: Payer: Self-pay | Admitting: Physician Assistant

## 2020-01-20 DIAGNOSIS — E1165 Type 2 diabetes mellitus with hyperglycemia: Secondary | ICD-10-CM

## 2020-01-22 ENCOUNTER — Other Ambulatory Visit: Payer: Self-pay | Admitting: Physician Assistant

## 2020-01-22 DIAGNOSIS — F319 Bipolar disorder, unspecified: Secondary | ICD-10-CM

## 2020-02-02 ENCOUNTER — Other Ambulatory Visit: Payer: Self-pay | Admitting: Physician Assistant

## 2020-02-04 ENCOUNTER — Ambulatory Visit: Payer: Self-pay

## 2020-02-04 ENCOUNTER — Telehealth: Payer: Self-pay

## 2020-02-04 DIAGNOSIS — J0140 Acute pansinusitis, unspecified: Secondary | ICD-10-CM

## 2020-02-04 DIAGNOSIS — E1169 Type 2 diabetes mellitus with other specified complication: Secondary | ICD-10-CM

## 2020-02-04 DIAGNOSIS — E785 Hyperlipidemia, unspecified: Secondary | ICD-10-CM

## 2020-02-04 DIAGNOSIS — J209 Acute bronchitis, unspecified: Secondary | ICD-10-CM

## 2020-02-04 NOTE — Chronic Care Management (AMB) (Signed)
Chronic Care Management Pharmacy  Name: Joanna Bishop  MRN: [id number] DOB: 06/02/1955   Chief Complaint/ HPI  Joanna Bishop,  64 y.o. , female referred by [place] for being at risk of failing SUPD metric.   PCP : Mar Daring, PA-C Patient Care Team: Rubye Beach as PCP - General (Physician Assistant)  Patient's chronic conditions include: Hyperlipidemia and Diabetes   Allergies  Allergen Reactions  . Penicillins     Medications: Outpatient Encounter Medications as of 02/04/2020  Medication Sig Note  . albuterol (VENTOLIN HFA) 108 (90 Base) MCG/ACT inhaler INHALE 1-2 PUFFS INTO THE LUNGS EVERY 4 TO 6 HOURS AS NEEDED FOR WHEEZING OR SHORTNESS OF BREATH.   Marland Kitchen aspirin 81 MG tablet Take by mouth daily. 08/02/2014: Received from: [place]:   . benztropine (COGENTIN) 0.5 MG tablet TAKE 1 TABLET BY MOUTH 2 TIMES DAILY.   . calcium carbonate (OS-CAL) 600 MG TABS tablet Take by mouth.   . Calcium-Magnesium-Vitamin D (CALCIUM 500 PO) Take 1 tablet by mouth 2 (two) times daily. 08/02/2014: Received from: [place]:   . divalproex (DEPAKOTE ER) 500 MG 24 hr tablet TAKE 2 TABLETS (1,000 MG TOTAL) BY MOUTH AT BEDTIME.   Marland Kitchen doxycycline (VIBRA-TABS) 100 MG tablet Take 1 tablet (100 mg total) by mouth 2 (two) times daily.   . fluticasone (FLONASE) 50 MCG/ACT nasal spray Place 2 sprays into the nose daily. 08/02/2014: Received from: [place]:   . glipiZIDE (GLUCOTROL XL) 10 MG 24 hr tablet TAKE 1 TABLET (10 MG TOTAL) BY MOUTH DAILY WITH BREAKFAST.   Marland Kitchen HYDROcodone-homatropine (HYCODAN) 5-1.5 MG/5ML syrup Take 5 mLs by mouth every 8 (eight) hours as needed for cough.   . INCRUSE ELLIPTA 62.5 MCG/INH AEPB TAKE 1 PUFF BY MOUTH EVERY DAY   . montelukast (SINGULAIR) 10 MG tablet Take 1 tablet (10 mg total) by mouth daily.   . Multiple Vitamin (MULTI-VITAMIN) tablet Take by mouth.    . NON FORMULARY Bladder Control Supplement Capsule   . oxybutynin (DITROPAN XL) 10 MG 24 hr tablet Take 1 tablet (10 mg total) by mouth at bedtime.   . predniSONE (DELTASONE) 10 MG tablet Take 6 tablets PO on day 1 and day 2, take 5 tablets PO on day 3 and day 4, take 4 tablets PO on day 5 and day 6, take 3 tablets PO on day 7 and day 8, take 2 tablets PO on day 9 and day 10, take one tablet PO on day 11 and day 12.   . traMADol (ULTRAM) 50 MG tablet Take by mouth every 6 (six) hours as needed.   . TRULICITY 1.5 YC/1.4GY SOPN INJECT 0.5 MLS (1.5 MG TOTAL) INTO THE SKIN ONCE A WEEK.   . [DISCONTINUED] losartan (COZAAR) 25 MG tablet TAKE 2 TABLETS BY MOUTH EVERY DAY   . [DISCONTINUED] XARELTO 10 MG TABS tablet Take 10 mg by mouth every morning.    No facility-administered encounter medications on file as of 02/04/2020.    Wt Readings from Last 3 Encounters:  08/28/19 252 lb 9.6 oz (114.6 kg)  06/25/19 240 lb (108.9 kg)  02/10/19 250 lb (113.4 kg)    Current Diagnosis/Assessment:  Hyperlipidemia   LDL goal < 70  Last lipids Lab Results  Component Value Date   CHOL 168 12/20/2017   HDL 35 (L) 12/20/2017   LDLCALC 71 12/20/2017   TRIG 310 (H) 12/20/2017  CHOLHDL 4.8 (H) 12/20/2017   Hepatic Function Latest Ref Rng & Units 03/12/2018 12/20/2017 10/14/2015  Total Protein 6.0 - 8.5 g/dL 6.5 6.9 6.7  Albumin 3.6 - 4.8 g/dL 4.2 4.7 4.5  AST 0 - 40 IU/L 34 34 31  ALT 0 - 32 IU/L 45(H) 39(H) 53(H)  Alk Phosphatase 39 - 117 IU/L 95 79 91  Total Bilirubin 0.0 - 1.2 mg/dL 0.4 0.5 0.6     The 10-year ASCVD risk score Mikey Bussing DC Jr., et al., 2013) is: 14.5%   Values used to calculate the score:     Age: 41 years     Sex: Female     Is Non-Hispanic African American: No     Diabetic: Yes     Tobacco smoker: No     Systolic Blood Pressure: 096 mmHg     Is BP treated: Yes     HDL Cholesterol: 35 mg/dL     Total Cholesterol: 168 mg/dL   Patient has failed these meds in past: na Patient  is currently uncontrolled on the following medications:  . None  We discussed: Spoke with patient at length about long-term risks of diabetes and specifically about the risk of cardiovascular disease. Patient was amenable to starting a cholesterol medication to reduce her risk of cardiovascular disease.   Patient is intermediate risk (7.5%-20%)  Plan  Recommend starting rosuvastatin 5 mg daily  [place] [phone]

## 2020-02-04 NOTE — Telephone Encounter (Signed)
Copied from CRM [id number]. Topic: General - Inquiry >> Feb 04, 2020 12:19 PM Daphine Deutscher D wrote: Reason for CRM: Pt called saying she just finished an antibiotic Friday for a cough stating while she was on the antibiotic her cough was better but now if has came back.  Please advise  CB#  [phone]

## 2020-02-05 MED ORDER — ROSUVASTATIN CALCIUM 5 MG PO TABS
5.0000 mg | ORAL_TABLET | Freq: Every day | ORAL | 3 refills | Status: DC
Start: 2020-02-05 — End: 2021-01-12

## 2020-02-05 MED ORDER — PREDNISONE 10 MG PO TABS: ORAL_TABLET | ORAL | 0 refills | Status: DC

## 2020-02-05 MED ORDER — DOXYCYCLINE HYCLATE 100 MG PO TABS: 100.0000 mg | ORAL_TABLET | Freq: Two times a day (BID) | ORAL | 0 refills | Status: DC

## 2020-02-05 NOTE — Telephone Encounter (Signed)
Refilled doxycycline and prednisone.  Sent in rosuvastatin per her conversation with the pharmacist to start a cholesterol lowering medication

## 2020-02-05 NOTE — Telephone Encounter (Signed)
Patient advised as directed below. 

## 2020-02-18 ENCOUNTER — Other Ambulatory Visit: Payer: Self-pay | Admitting: Physician Assistant

## 2020-02-18 DIAGNOSIS — J301 Allergic rhinitis due to pollen: Secondary | ICD-10-CM

## 2020-03-01 NOTE — Progress Notes (Signed)
Subjective:   Joanna Bishop is a 65 y.o. female who presents for Medicare Annual (Subsequent) preventive examination.  I connected with Marden Noble today by telephone and verified that I am speaking with the correct person using two identifiers. Location patient: home Location provider: work Persons participating in the virtual visit: patient, provider.   I discussed the limitations, risks, security and privacy concerns of performing an evaluation and management service by telephone and the availability of in person appointments. I also discussed with the patient that there may be a patient responsible charge related to this service. The patient expressed understanding and verbally consented to this telephonic visit.    Interactive audio and video telecommunications were attempted between this provider and patient, however failed, due to patient having technical difficulties OR patient did not have access to video capability.  We continued and completed visit with audio only.   Review of Systems    N/A  Cardiac Risk Factors include: advanced age (>64men, >38 women);diabetes mellitus;dyslipidemia     Objective:    There were no vitals filed for this visit. There is no height or weight on file to calculate BMI.  Advanced Directives 03/02/2020 10/18/2015 07/12/2015 12/09/2014 08/03/2014  Does Patient Have a Medical Advance Directive? Yes No No Yes Yes  Type of Estate agent of [place];Living will - - Healthcare Power of [place];Living will Healthcare Power of [place];Living will  Copy of Healthcare Power of Attorney in Chart? No - copy requested - - - -  Would patient like information on creating a medical advance directive? - No - patient declined information - - -  Some encounter information is confidential and restricted. Go to Review Flowsheets activity to see all data.    Current Medications (verified) Outpatient Encounter Medications as of 03/02/2020   Medication Sig  . albuterol (VENTOLIN HFA) 108 (90 Base) MCG/ACT inhaler INHALE 1-2 PUFFS INTO THE LUNGS EVERY 4 TO 6 HOURS AS NEEDED FOR WHEEZING OR SHORTNESS OF BREATH.  Marland Kitchen aspirin 81 MG tablet Take by mouth daily.  . benztropine (COGENTIN) 0.5 MG tablet TAKE 1 TABLET BY MOUTH 2 TIMES DAILY.  . Calcium-Magnesium-Vitamin D (CALCIUM 500 PO) Take 1 tablet by mouth daily at 6 (six) AM.  . divalproex (DEPAKOTE ER) 500 MG 24 hr tablet TAKE 2 TABLETS (1,000 MG TOTAL) BY MOUTH AT BEDTIME.  . fluticasone (FLONASE) 50 MCG/ACT nasal spray Place 2 sprays into the nose daily.  Marland Kitchen glipiZIDE (GLUCOTROL XL) 10 MG 24 hr tablet TAKE 1 TABLET (10 MG TOTAL) BY MOUTH DAILY WITH BREAKFAST.  Marland Kitchen INCRUSE ELLIPTA 62.5 MCG/INH AEPB TAKE 1 PUFF BY MOUTH EVERY DAY  . montelukast (SINGULAIR) 10 MG tablet TAKE 1 TABLET BY MOUTH EVERY DAY  . Multiple Vitamin (MULTI-VITAMIN) tablet Take by mouth.  . NON FORMULARY Bladder Control Supplement Capsule daily  . oxybutynin (DITROPAN XL) 10 MG 24 hr tablet Take 1 tablet (10 mg total) by mouth at bedtime.  . rosuvastatin (CRESTOR) 5 MG tablet Take 1 tablet (5 mg total) by mouth daily.  . traMADol (ULTRAM) 50 MG tablet Take by mouth every 6 (six) hours as needed.  . TRULICITY 1.5 MG/0.5ML SOPN INJECT 0.5 MLS (1.5 MG TOTAL) INTO THE SKIN ONCE A WEEK.  . calcium carbonate (OS-CAL) 600 MG TABS tablet Take by mouth. (Patient not taking: Reported on 03/02/2020)  . doxycycline (VIBRA-TABS) 100 MG tablet Take 1 tablet (100 mg total) by mouth 2 (two) times daily. (Patient not taking: No sig reported)  .  HYDROcodone-homatropine (HYCODAN) 5-1.5 MG/5ML syrup Take 5 mLs by mouth every 8 (eight) hours as needed for cough. (Patient not taking: Reported on 03/02/2020)  . predniSONE (DELTASONE) 10 MG tablet Take 6 tablets PO on day 1 and day 2, take 5 tablets PO on day 3 and day 4, take 4 tablets PO on day 5 and day 6, take 3 tablets PO on day 7 and day 8, take 2 tablets PO on day 9 and day 10, take one  tablet PO on day 11 and day 12. (Patient not taking: Reported on 03/02/2020)  . [DISCONTINUED] losartan (COZAAR) 25 MG tablet TAKE 2 TABLETS BY MOUTH EVERY DAY  . [DISCONTINUED] XARELTO 10 MG TABS tablet Take 10 mg by mouth every morning.   No facility-administered encounter medications on file as of 03/02/2020.    Allergies (verified) Penicillins   History: Past Medical History:  Diagnosis Date  . Anxiety   . Asthma   . Depression   . Diabetes mellitus without complication ([place])   . Hyperlipidemia    Past Surgical History:  Procedure Laterality Date  . ABDOMINAL HYSTERECTOMY    . ARTHROPLASTY    . CHOLECYSTECTOMY    . FEMUR FRACTURE SURGERY    . TOTAL KNEE ARTHROPLASTY Left    Family History  Problem Relation Age of Onset  . Diabetes Mother   . COPD Mother   . Diabetes Father   . Coronary artery disease Father   . Coronary artery disease Maternal Grandmother   . Heart attack Maternal Grandmother   . Diabetes Paternal Grandmother    Social History   Socioeconomic History  . Marital status: Widowed    Spouse name: Not on file  . Number of children: 3  . Years of education: Not on file  . Highest education level: Some college, no degree  Occupational History  . Occupation: disability  Tobacco Use  . Smoking status: Never Smoker  . Smokeless tobacco: Never Used  Substance and Sexual Activity  . Alcohol use: Yes    Alcohol/week: 0.0 standard drinks    Comment: 1 drink every 3 months  . Drug use: Never  . Sexual activity: Not Currently  Other Topics Concern  . Not on file  Social History Narrative  . Not on file   Social Determinants of Health   Financial Resource Strain: Low Risk   . Difficulty of Paying Living Expenses: Not hard at all  Food Insecurity: No Food Insecurity  . Worried About Charity fundraiser in the Last Year: Never true  . Ran Out of Food in the Last Year: Never true  Transportation Needs: No Transportation Needs  . Lack of Transportation  (Medical): No  . Lack of Transportation (Non-Medical): No  Physical Activity: Insufficiently Active  . Days of Exercise per Week: 4 days  . Minutes of Exercise per Session: 10 min  Stress: Stress Concern Present  . Feeling of Stress : To some extent  Social Connections: Socially Isolated  . Frequency of Communication with Friends and Family: More than three times a week  . Frequency of Social Gatherings with Friends and Family: Three times a week  . Attends Religious Services: Never  . Active Member of Clubs or Organizations: No  . Attends Archivist Meetings: Never  . Marital Status: Widowed    Tobacco Counseling Counseling given: Not Answered   Clinical Intake:  Pre-visit preparation completed: Yes  Pain : No/denies pain     Nutritional Risks: None Diabetes: Yes  How often do you need to have someone help you when you read instructions, pamphlets, or other written materials from your doctor or pharmacy?: 1 - Never  Diabetic? Yes  Nutrition Risk Assessment:  Has the patient had any N/V/D within the last 2 months?  No  Does the patient have any non-healing wounds?  No  Has the patient had any unintentional weight loss or weight gain?  No   Diabetes:  Is the patient diabetic?  Yes  If diabetic, was a CBG obtained today?  No  Did the patient bring in their glucometer from home?  No  How often do you monitor your CBG's? Once a week.   Financial Strains and Diabetes Management:  Are you having any financial strains with the device, your supplies or your medication? No .  Does the patient want to be seen by Chronic Care Management for management of their diabetes?  No  Would the patient like to be referred to a Nutritionist or for Diabetic Management?  No   Diabetic Exams:  Diabetic Eye Exam: Overdue for diabetic eye exam. Pt has been advised about the importance in completing this exam.  Diabetic Foot Exam: Overdue, Pt has been advised about the  importance in completing this exam.    Interpreter Needed?: No  Information entered by :: [facility], LPN   Activities of Daily Living In your present state of health, do you have any difficulty performing the following activities: 03/02/2020 08/28/2019  Hearing? N Y  Vision? Y Y  Comment Needs a new eye glass prescription. Apt scheduled for 03/12/20. sometimes  Difficulty concentrating or making decisions? N Y  Walking or climbing stairs? N Y  Dressing or bathing? N N  Doing errands, shopping? N Y  Quarry manager and eating ? N -  Using the Toilet? N -  In the past six months, have you accidently leaked urine? Y -  Comment Currently taking Oxybutynin for leakage. -  Do you have problems with loss of bowel control? N -  Managing your Medications? N -  Managing your Finances? N -  Housekeeping or managing your Housekeeping? N -  Some recent data might be hidden    Patient Care Team: Reine Just as PCP - General (Physician Assistant) Verne Carrow, OD (Optometry)  Indicate any recent Medical Services you may have received from other than [facility] providers in the past year (date may be approximate).     Assessment:   This is a routine wellness examination for Joanna Bishop.  Hearing/Vision screen No exam data present  Dietary issues and exercise activities discussed: Current Exercise Habits: Home exercise routine, Type of exercise: walking, Time (Minutes): 10, Frequency (Times/Week): 4, Weekly Exercise (Minutes/Week): 40, Intensity: Mild, Exercise limited by: None identified  Goals    . Exercise 3x per week (30 min per time)     Recommend to exercise for 3 days a week for at least 30 minutes at a time.     . Prevent falls     Recommend to remove any items from the home that may cause slips or trips.      Depression Screen PHQ 2/9 Scores 03/02/2020 07/07/2018 02/28/2017 12/09/2014  PHQ - 2 Score 0 2 2 6   PHQ- 9 Score - 10 7 24     Fall Risk Fall Risk  03/02/2020 07/07/2018   Falls in the past year? 1 0  Number falls in past yr: 0 -  Injury with Fall? 0 0  Follow up Falls  prevention discussed -    FALL RISK PREVENTION PERTAINING TO THE HOME:  Any stairs in or around the home? No  If so, are there any without handrails? No  Home free of loose throw rugs in walkways, pet beds, electrical cords, etc? Yes  Adequate lighting in your home to reduce risk of falls? Yes   ASSISTIVE DEVICES UTILIZED TO PREVENT FALLS:  Life alert? No  Use of a cane, walker or w/c? No  Grab bars in the bathroom? Yes  Shower chair or bench in shower? Yes  Elevated toilet seat or a handicapped toilet? Yes    Cognitive Function: Normal cognitive status assessed by observation by this Nurse Health Advisor. No abnormalities found.       6CIT Screen 07/07/2018  What Year? 0 points  What month? 0 points  What time? 0 points  Count back from 20 0 points  Months in reverse 0 points  Repeat phrase 0 points  Total Score 0    Immunizations Immunization History  Administered Date(s) Administered  . Influenza,inj,Quad PF,6+ Mos 01/06/2018, 12/12/2018  . Pneumococcal Polysaccharide-23 07/30/2012  . Tdap 07/28/2009    TDAP status: Due, Education has been provided regarding the importance of this vaccine. Advised may receive this vaccine at local pharmacy or Health Dept. Aware to provide a copy of the vaccination record if obtained from local pharmacy or Health Dept. Verbalized acceptance and understanding.  Flu Vaccine status: Due, Education has been provided regarding the importance of this vaccine. Advised may receive this vaccine at local pharmacy or Health Dept. Aware to provide a copy of the vaccination record if obtained from local pharmacy or Health Dept. Verbalized acceptance and understanding.  Covid-19 vaccine status: Declined, Education has been provided regarding the importance of this vaccine but patient still declined. Advised may receive this vaccine at local pharmacy  or Health Dept.or vaccine clinic. Aware to provide a copy of the vaccination record if obtained from local pharmacy or Health Dept. Verbalized acceptance and understanding.  Qualifies for Shingles Vaccine? Yes   Zostavax completed No   Shingrix Completed?: No.    Education has been provided regarding the importance of this vaccine. Patient has been advised to call insurance company to determine out of pocket expense if they have not yet received this vaccine. Advised may also receive vaccine at local pharmacy or Health Dept. Verbalized acceptance and understanding.  Screening Tests Health Maintenance  Topic Date Due  . OPHTHALMOLOGY EXAM  Never done  . HIV Screening  Never done  . MAMMOGRAM  Never done  . URINE MICROALBUMIN  12/21/2018  . FOOT EXAM  01/07/2019  . HEMOGLOBIN A1C  02/28/2020  . COVID-19 Vaccine (1) 03/18/2020 (Originally 05/03/1967)  . INFLUENZA VACCINE  05/26/2020 (Originally 09/27/2019)  . TETANUS/TDAP  03/02/2021 (Originally 07/29/2019)  . PAP SMEAR-Modifier  07/06/2021  . COLONOSCOPY (Pts 45-51yrs Insurance coverage will need to be confirmed)  10/18/2026  . PNEUMOCOCCAL POLYSACCHARIDE VACCINE AGE 46-64 HIGH RISK  Completed  . Hepatitis C Screening  Completed    Health Maintenance  Health Maintenance Due  Topic Date Due  . OPHTHALMOLOGY EXAM  Never done  . HIV Screening  Never done  . MAMMOGRAM  Never done  . URINE MICROALBUMIN  12/21/2018  . FOOT EXAM  01/07/2019  . HEMOGLOBIN A1C  02/28/2020    Colorectal cancer screening: Referral to GI placed today. Pt aware the office will call re: appt.  Mammogram status: Ordered today. Pt provided with contact info and advised to  call to schedule appt.   Lung Cancer Screening: (Low Dose CT Chest recommended if Age 22-80 years, 30 pack-year currently smoking OR have quit w/in 15years.) does not qualify.   Additional Screening:  Hepatitis C Screening: Up to date  Vision Screening: Recommended annual ophthalmology exams  for early detection of glaucoma and other disorders of the eye. Is the patient up to date with their annual eye exam?  Yes  Who is the provider or what is the name of the office in which the patient attends annual eye exams? Dr Clearance Coots If pt is not established with a provider, would they like to be referred to a provider to establish care? No .   Dental Screening: Recommended annual dental exams for proper oral hygiene  Community Resource Referral / Chronic Care Management: CRR required this visit?  No   CCM required this visit?  No      Plan:     I have personally reviewed and noted the following in the patient's chart:   . Medical and social history . Use of alcohol, tobacco or illicit drugs  . Current medications and supplements . Functional ability and status . Nutritional status . Physical activity . Advanced directives . List of other physicians . Hospitalizations, surgeries, and ER visits in previous 12 months . Vitals . Screenings to include cognitive, depression, and falls . Referrals and appointments  In addition, I have reviewed and discussed with patient certain preventive protocols, quality metrics, and best practice recommendations. A written personalized care plan for preventive services as well as general preventive health recommendations were provided to patient.     Unique Sillas [place], [place]   06/27/8839   Nurse Notes: Pt needs a diabetic foot exam, Hgb A1c check and urine check at next in office apt. Pt has an eye exam scheduled for 03/12/20. Pt declined receiving a future flu and Covid vaccine.

## 2020-03-02 ENCOUNTER — Ambulatory Visit (INDEPENDENT_AMBULATORY_CARE_PROVIDER_SITE_OTHER): Payer: Medicare HMO

## 2020-03-02 ENCOUNTER — Other Ambulatory Visit: Payer: Self-pay

## 2020-03-02 DIAGNOSIS — Z1231 Encounter for screening mammogram for malignant neoplasm of breast: Secondary | ICD-10-CM | POA: Diagnosis not present

## 2020-03-02 DIAGNOSIS — Z0000 Encounter for general adult medical examination without abnormal findings: Secondary | ICD-10-CM | POA: Diagnosis not present

## 2020-03-02 DIAGNOSIS — Z1211 Encounter for screening for malignant neoplasm of colon: Secondary | ICD-10-CM

## 2020-03-02 NOTE — Patient Instructions (Signed)
Joanna Bishop , Thank you for taking time to come for your Medicare Wellness Visit. I appreciate your ongoing commitment to your health goals. Please review the following plan we discussed and let me know if I can assist you in the future.   Screening recommendations/referrals: Colonoscopy: Referral to GI placed today. Pt aware the office will call re: appt. Mammogram: Ordered today. Pt provided with contact info and advised to call to schedule appt.  Recommended yearly ophthalmology/optometry visit for glaucoma screening and checkup Recommended yearly dental visit for hygiene and checkup  Vaccinations: Influenza vaccine: Currently due, declined receiving. Tdap vaccine: Currently due, declined at this time. Shingles vaccine: Shingrix discussed. Please contact your pharmacy for coverage information.     Advanced directives: Please bring a copy of your POA (Power of Attorney) and/or Living Will to your next appointment.   Conditions/risks identified: Fall risk preventatives discussed today. Recommend to start walking 3 days a week for 30 minutes at a time.  Next appointment: 04/01/20 @ 8:20 AM with Joycelyn Man   Preventive Care 63 Years and Older, Female Preventive care refers to lifestyle choices and visits with your health care provider that can promote health and wellness. What does preventive care include?  A yearly physical exam. This is also called an annual well check.  Dental exams once or twice a year.  Routine eye exams. Ask your health care provider how often you should have your eyes checked.  Personal lifestyle choices, including:  Daily care of your teeth and gums.  Regular physical activity.  Eating a healthy diet.  Avoiding tobacco and drug use.  Limiting alcohol use.  Practicing safe sex.  Taking low-dose aspirin every day.  Taking vitamin and mineral supplements as recommended by your health care provider. What happens during an annual well check? The  services and screenings done by your health care provider during your annual well check will depend on your age, overall health, lifestyle risk factors, and family history of disease. Counseling  Your health care provider may ask you questions about your:  Alcohol use.  Tobacco use.  Drug use.  Emotional well-being.  Home and relationship well-being.  Sexual activity.  Eating habits.  History of falls.  Memory and ability to understand (cognition).  Work and work Astronomer.  Reproductive health. Screening  You may have the following tests or measurements:  Height, weight, and BMI.  Blood pressure.  Lipid and cholesterol levels. These may be checked every 5 years, or more frequently if you are over 45 years old.  Skin check.  Lung cancer screening. You may have this screening every year starting at age 49 if you have a 30-pack-year history of smoking and currently smoke or have quit within the past 15 years.  Fecal occult blood test (FOBT) of the stool. You may have this test every year starting at age 21.  Flexible sigmoidoscopy or colonoscopy. You may have a sigmoidoscopy every 5 years or a colonoscopy every 10 years starting at age 30.  Hepatitis C blood test.  Hepatitis B blood test.  Sexually transmitted disease (STD) testing.  Diabetes screening. This is done by checking your blood sugar (glucose) after you have not eaten for a while (fasting). You may have this done every 1-3 years.  Bone density scan. This is done to screen for osteoporosis. You may have this done starting at age 52.  Mammogram. This may be done every 1-2 years. Talk to your health care provider about how often you should  have regular mammograms. Talk with your health care provider about your test results, treatment options, and if necessary, the need for more tests. Vaccines  Your health care provider may recommend certain vaccines, such as:  Influenza vaccine. This is recommended  every year.  Tetanus, diphtheria, and acellular pertussis (Tdap, Td) vaccine. You may need a Td booster every 10 years.  Zoster vaccine. You may need this after age 68.  Pneumococcal 13-valent conjugate (PCV13) vaccine. One dose is recommended after age 43.  Pneumococcal polysaccharide (PPSV23) vaccine. One dose is recommended after age 41. Talk to your health care provider about which screenings and vaccines you need and how often you need them. This information is not intended to replace advice given to you by your health care provider. Make sure you discuss any questions you have with your health care provider. Document Released: 03/11/2015 Document Revised: 11/02/2015 Document Reviewed: 12/14/2014 Elsevier Interactive Patient Education  2017 [place] Prevention in the Home Falls can cause injuries. They can happen to people of all ages. There are many things you can do to make your home safe and to help prevent falls. What can I do on the outside of my home?  Regularly fix the edges of walkways and driveways and fix any cracks.  Remove anything that might make you trip as you walk through a door, such as a raised step or threshold.  Trim any bushes or trees on the path to your home.  Use bright outdoor lighting.  Clear any walking paths of anything that might make someone trip, such as rocks or tools.  Regularly check to see if handrails are loose or broken. Make sure that both sides of any steps have handrails.  Any raised decks and porches should have guardrails on the edges.  Have any leaves, snow, or ice cleared regularly.  Use sand or salt on walking paths during winter.  Clean up any spills in your garage right away. This includes oil or grease spills. What can I do in the bathroom?  Use night lights.  Install grab bars by the toilet and in the tub and shower. Do not use towel bars as grab bars.  Use non-skid mats or decals in the tub or shower.  If  you need to sit down in the shower, use a plastic, non-slip stool.  Keep the floor dry. Clean up any water that spills on the floor as soon as it happens.  Remove soap buildup in the tub or shower regularly.  Attach bath mats securely with double-sided non-slip rug tape.  Do not have throw rugs and other things on the floor that can make you trip. What can I do in the bedroom?  Use night lights.  Make sure that you have a light by your bed that is easy to reach.  Do not use any sheets or blankets that are too big for your bed. They should not hang down onto the floor.  Have a firm chair that has side arms. You can use this for support while you get dressed.  Do not have throw rugs and other things on the floor that can make you trip. What can I do in the kitchen?  Clean up any spills right away.  Avoid walking on wet floors.  Keep items that you use a lot in easy-to-reach places.  If you need to reach something above you, use a strong step stool that has a grab bar.  Keep electrical cords out of  the way.  Do not use floor polish or wax that makes floors slippery. If you must use wax, use non-skid floor wax.  Do not have throw rugs and other things on the floor that can make you trip. What can I do with my stairs?  Do not leave any items on the stairs.  Make sure that there are handrails on both sides of the stairs and use them. Fix handrails that are broken or loose. Make sure that handrails are as long as the stairways.  Check any carpeting to make sure that it is firmly attached to the stairs. Fix any carpet that is loose or worn.  Avoid having throw rugs at the top or bottom of the stairs. If you do have throw rugs, attach them to the floor with carpet tape.  Make sure that you have a light switch at the top of the stairs and the bottom of the stairs. If you do not have them, ask someone to add them for you. What else can I do to help prevent falls?  Wear shoes  that:  Do not have high heels.  Have rubber bottoms.  Are comfortable and fit you well.  Are closed at the toe. Do not wear sandals.  If you use a stepladder:  Make sure that it is fully opened. Do not climb a closed stepladder.  Make sure that both sides of the stepladder are locked into place.  Ask someone to hold it for you, if possible.  Clearly mark and make sure that you can see:  Any grab bars or handrails.  First and last steps.  Where the edge of each step is.  Use tools that help you move around (mobility aids) if they are needed. These include:  Canes.  Walkers.  Scooters.  Crutches.  Turn on the lights when you go into a dark area. Replace any light bulbs as soon as they burn out.  Set up your furniture so you have a clear path. Avoid moving your furniture around.  If any of your floors are uneven, fix them.  If there are any pets around you, be aware of where they are.  Review your medicines with your doctor. Some medicines can make you feel dizzy. This can increase your chance of falling. Ask your doctor what other things that you can do to help prevent falls. This information is not intended to replace advice given to you by your health care provider. Make sure you discuss any questions you have with your health care provider. Document Released: 12/09/2008 Document Revised: 07/21/2015 Document Reviewed: 03/19/2014 Elsevier Interactive Patient Education  2017 [employer].

## 2020-03-09 ENCOUNTER — Other Ambulatory Visit: Payer: Self-pay

## 2020-03-09 ENCOUNTER — Telehealth (INDEPENDENT_AMBULATORY_CARE_PROVIDER_SITE_OTHER): Payer: Self-pay | Admitting: Gastroenterology

## 2020-03-09 DIAGNOSIS — Z1211 Encounter for screening for malignant neoplasm of colon: Secondary | ICD-10-CM

## 2020-03-09 MED ORDER — NA SULFATE-K SULFATE-MG SULF 17.5-3.13-1.6 GM/177ML PO SOLN: 1.0000 | Freq: Once | ORAL | 0 refills | Status: AC

## 2020-03-09 NOTE — Progress Notes (Signed)
Gastroenterology Pre-Procedure Review  Request Date: 03/24/20 Requesting Physician: Dr. Allegra Lai  PATIENT REVIEW QUESTIONS: The patient responded to the following health history questions as indicated:    1. Are you having any GI issues? no 2. Do you have a personal history of Polyps? no 3. Do you have a family history of Colon Cancer or Polyps? yes (grandfather colon cancer in his late 48s) 4. Diabetes Mellitus? yes (type 2) 5. Joint replacements in the past 12 months?no 6. Major health problems in the past 3 months?no 7. Any artificial heart valves, MVP, or defibrillator?no    MEDICATIONS & ALLERGIES:    Patient reports the following regarding taking any anticoagulation/antiplatelet therapy:   Plavix, Coumadin, Eliquis, Xarelto, Lovenox, Pradaxa, Brilinta, or Effient? no Aspirin? yes (81 mg daily)  Patient confirms/reports the following medications:  Current Outpatient Medications  Medication Sig Dispense Refill  . albuterol (VENTOLIN HFA) 108 (90 Base) MCG/ACT inhaler INHALE 1-2 PUFFS INTO THE LUNGS EVERY 4 TO 6 HOURS AS NEEDED FOR WHEEZING OR SHORTNESS OF BREATH. 18 g 4  . aspirin 81 MG tablet Take by mouth daily.    . benztropine (COGENTIN) 0.5 MG tablet TAKE 1 TABLET BY MOUTH 2 TIMES DAILY. 180 tablet 1  . calcium carbonate (OS-CAL) 600 MG TABS tablet Take by mouth.    . Calcium-Magnesium-Vitamin D (CALCIUM 500 PO) Take 1 tablet by mouth daily at 6 (six) AM.    . divalproex (DEPAKOTE ER) 500 MG 24 hr tablet TAKE 2 TABLETS (1,000 MG TOTAL) BY MOUTH AT BEDTIME. 180 tablet 3  . glipiZIDE (GLUCOTROL XL) 10 MG 24 hr tablet TAKE 1 TABLET (10 MG TOTAL) BY MOUTH DAILY WITH BREAKFAST. 90 tablet 0  . INCRUSE ELLIPTA 62.5 MCG/INH AEPB TAKE 1 PUFF BY MOUTH EVERY DAY 30 each 11  . montelukast (SINGULAIR) 10 MG tablet TAKE 1 TABLET BY MOUTH EVERY DAY 90 tablet 0  . Multiple Vitamin (MULTI-VITAMIN) tablet Take by mouth.    . NON FORMULARY Bladder Control Supplement Capsule daily    . oxybutynin  (DITROPAN XL) 10 MG 24 hr tablet Take 1 tablet (10 mg total) by mouth at bedtime. 90 tablet 3  . rosuvastatin (CRESTOR) 5 MG tablet Take 1 tablet (5 mg total) by mouth daily. 90 tablet 3  . traMADol (ULTRAM) 50 MG tablet Take by mouth every 6 (six) hours as needed.    . TRULICITY 1.5 MG/0.5ML SOPN INJECT 0.5 MLS (1.5 MG TOTAL) INTO THE SKIN ONCE A WEEK. 3 mL 2  . doxycycline (VIBRA-TABS) 100 MG tablet Take 1 tablet (100 mg total) by mouth 2 (two) times daily. (Patient not taking: No sig reported) 20 tablet 0  . fluticasone (FLONASE) 50 MCG/ACT nasal spray Place 2 sprays into the nose daily. (Patient not taking: Reported on 03/09/2020)    . HYDROcodone-homatropine (HYCODAN) 5-1.5 MG/5ML syrup Take 5 mLs by mouth every 8 (eight) hours as needed for cough. (Patient not taking: No sig reported) 120 mL 0  . predniSONE (DELTASONE) 10 MG tablet Take 6 tablets PO on day 1 and day 2, take 5 tablets PO on day 3 and day 4, take 4 tablets PO on day 5 and day 6, take 3 tablets PO on day 7 and day 8, take 2 tablets PO on day 9 and day 10, take one tablet PO on day 11 and day 12. (Patient not taking: No sig reported) 42 tablet 0   No current facility-administered medications for this visit.    Patient confirms/reports the following  allergies:  Allergies  Allergen Reactions  . Penicillins     No orders of the defined types were placed in this encounter.   AUTHORIZATION INFORMATION Primary Insurance: 1D#: Group #:  Secondary Insurance: 1D#: Group #:  SCHEDULE INFORMATION: Date: 03/24/20 Time: Location:[facility]

## 2020-03-17 ENCOUNTER — Other Ambulatory Visit: Payer: Self-pay

## 2020-03-17 ENCOUNTER — Encounter: Payer: Self-pay | Admitting: Gastroenterology

## 2020-03-18 ENCOUNTER — Encounter: Payer: Self-pay | Admitting: Anesthesiology

## 2020-03-21 NOTE — Discharge Instructions (Signed)

## 2020-03-22 ENCOUNTER — Telehealth: Payer: Self-pay

## 2020-03-22 ENCOUNTER — Other Ambulatory Visit: Admission: RE | Admit: 2020-03-22 | Source: Ambulatory Visit
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
## 2020-03-24 ENCOUNTER — Ambulatory Visit: Admission: RE | Admit: 2020-03-24 | Source: Home / Self Care | Admitting: Gastroenterology

## 2020-03-24 ENCOUNTER — Other Ambulatory Visit: Payer: Self-pay | Admitting: Physician Assistant

## 2020-03-24 DIAGNOSIS — E119 Type 2 diabetes mellitus without complications: Secondary | ICD-10-CM

## 2020-03-24 HISTORY — DX: Dizziness and giddiness: R42

## 2020-03-24 HISTORY — DX: Unspecified osteoarthritis, unspecified site: M19.90

## 2020-03-24 HISTORY — DX: Migraine, unspecified, not intractable, without status migrainosus: G43.909

## 2020-03-24 SURGERY — COLONOSCOPY WITH PROPOFOL
Anesthesia: Choice

## 2020-03-24 NOTE — Telephone Encounter (Signed)
Courtesy refill #30; no refills; due for appt. In one week; overdue for Hgb. A1C  Requested Prescriptions  Pending Prescriptions Disp Refills  . glipiZIDE (GLUCOTROL XL) 10 MG 24 hr tablet [Pharmacy Med Name: GLIPIZIDE ER 10 MG TABLET] 30 tablet 0    Sig: TAKE 1 TABLET (10 MG TOTAL) BY MOUTH DAILY WITH BREAKFAST.     Endocrinology:  Diabetes - Sulfonylureas Failed - 03/24/2020  3:12 AM      Failed - HBA1C is between 0 and 7.9 and within 180 days    Hemoglobin A1C  Date Value Ref Range Status  08/28/2019 10.4 (A) 4.0 - 5.6 % Final   Hgb A1c MFr Bld  Date Value Ref Range Status  03/12/2018 8.8 (H) 4.8 - 5.6 % Final    Comment:             Prediabetes: 5.7 - 6.4          Diabetes: >6.4          Glycemic control for adults with diabetes: <7.0          Passed - Valid encounter within last 6 months    Recent Outpatient Visits          2 months ago Acute bronchitis with COPD [facility])   [facility] [place], [place], [place]-C   6 months ago Type 2 diabetes mellitus with hyperglycemia, without long-term current use of insulin [facility])   [facility] [place], [place], [place]   1 year ago Moderate persistent asthmatic bronchitis with acute exacerbation   [facility] [place], [place], [place]   1 year ago Type 2 diabetes mellitus without complication, without long-term current use of insulin [facility])   [facility] [place], [place], [place]   1 year ago Moderate persistent asthmatic bronchitis with acute exacerbation   [facility] [place], [place], [place]      Future Appointments            In 1 week Rosezetta Schlatter, [place], PA-C [facility], [facility]

## 2020-04-01 ENCOUNTER — Encounter: Admitting: Physician Assistant

## 2020-04-04 DIAGNOSIS — E119 Type 2 diabetes mellitus without complications: Secondary | ICD-10-CM | POA: Diagnosis not present

## 2020-04-04 LAB — HM DIABETES EYE EXAM

## 2020-04-05 ENCOUNTER — Encounter: Payer: Self-pay | Admitting: Physician Assistant

## 2020-04-08 ENCOUNTER — Encounter: Admitting: Physician Assistant

## 2020-04-15 ENCOUNTER — Other Ambulatory Visit: Payer: Self-pay | Admitting: Physician Assistant

## 2020-04-15 DIAGNOSIS — E119 Type 2 diabetes mellitus without complications: Secondary | ICD-10-CM

## 2020-04-22 ENCOUNTER — Encounter: Admitting: Physician Assistant

## 2020-04-24 ENCOUNTER — Other Ambulatory Visit: Payer: Self-pay | Admitting: Physician Assistant

## 2020-04-24 DIAGNOSIS — F319 Bipolar disorder, unspecified: Secondary | ICD-10-CM

## 2020-04-24 NOTE — Telephone Encounter (Signed)
Requested medication (s) are due for refill today: yes  Requested medication (s) are on the active medication list: yes  Last refill:  01/30/19  Future visit scheduled: no  Notes to clinic:  med not delegated to NT to RF   Requested Prescriptions  Pending Prescriptions Disp Refills   divalproex (DEPAKOTE ER) 500 MG 24 hr tablet [Pharmacy Med Name: DIVALPROEX SOD ER 500 MG TAB] 180 tablet 3    Sig: TAKE 2 TABLETS (1,000 MG TOTAL) BY MOUTH AT BEDTIME.      Not Delegated - Neurology:  Anticonvulsants - Valproates Failed - 04/24/2020  9:05 AM      Failed - This refill cannot be delegated      Failed - AST in normal range and within 360 days    AST  Date Value Ref Range Status  03/12/2018 34 0 - 40 IU/L Final          Failed - ALT in normal range and within 360 days    ALT  Date Value Ref Range Status  03/12/2018 45 (H) 0 - 32 IU/L Final          Failed - Valproic Acid (serum) in normal range and within 360 days    No results found for: VALPROATE, VPAT        Passed - HGB in normal range and within 360 days    Hemoglobin  Date Value Ref Range Status  06/25/2019 14.2 12.0 - 15.0 g/dL Final  13/24/4010 27.2 11.1 - 15.9 g/dL Final          Passed - PLT in normal range and within 360 days    Platelets  Date Value Ref Range Status  06/25/2019 195 150 - 400 K/uL Final  03/12/2018 191 150 - 450 x10E3/uL Final          Passed - WBC in normal range and within 360 days    WBC  Date Value Ref Range Status  06/25/2019 9.5 4.0 - 10.5 K/uL Final          Passed - HCT in normal range and within 360 days    HCT  Date Value Ref Range Status  06/25/2019 41.6 36.0 - 46.0 % Final   Hematocrit  Date Value Ref Range Status  03/12/2018 40.6 34.0 - 46.6 % Final          Passed - Valid encounter within last 12 months    Recent Outpatient Visits           3 months ago Acute bronchitis with COPD [facility])   [facility] [place], [place], [place]   8 months ago  Type 2 diabetes mellitus with hyperglycemia, without long-term current use of insulin [facility])   [facility] [place], [place], [place]   1 year ago Moderate persistent asthmatic bronchitis with acute exacerbation   [facility] [place], [place], [place]   1 year ago Type 2 diabetes mellitus without complication, without long-term current use of insulin [facility])   [facility] [place], [place], [place]   1 year ago Moderate persistent asthmatic bronchitis with acute exacerbation   [facility] [place], [place], [place]

## 2020-05-09 DIAGNOSIS — Z96659 Presence of unspecified artificial knee joint: Secondary | ICD-10-CM | POA: Insufficient documentation

## 2020-08-19 DIAGNOSIS — Z1389 Encounter for screening for other disorder: Secondary | ICD-10-CM | POA: Diagnosis not present

## 2020-08-19 DIAGNOSIS — R0989 Other specified symptoms and signs involving the circulatory and respiratory systems: Secondary | ICD-10-CM | POA: Diagnosis not present

## 2020-09-17 ENCOUNTER — Other Ambulatory Visit: Payer: Self-pay | Admitting: Physician Assistant

## 2020-09-17 DIAGNOSIS — N3946 Mixed incontinence: Secondary | ICD-10-CM

## 2020-09-17 DIAGNOSIS — N3281 Overactive bladder: Secondary | ICD-10-CM

## 2020-09-26 NOTE — Telephone Encounter (Signed)
Please have her make f/u appt before we send in refills

## 2020-09-26 NOTE — Telephone Encounter (Signed)
LOV 08/2019 with Jenni burnette. Please review. Thanks!

## 2020-10-11 ENCOUNTER — Other Ambulatory Visit: Payer: Self-pay | Admitting: Family Medicine

## 2020-10-11 DIAGNOSIS — N3946 Mixed incontinence: Secondary | ICD-10-CM

## 2020-10-11 DIAGNOSIS — N3281 Overactive bladder: Secondary | ICD-10-CM

## 2020-10-11 NOTE — Telephone Encounter (Signed)
  Notes to clinic:  REQUEST FOR 90 DAYS PRESCRIPTION   Requested Prescriptions  Pending Prescriptions Disp Refills   oxybutynin (DITROPAN-XL) 10 MG 24 hr tablet [Pharmacy Med Name: OXYBUTYNIN CL ER 10 MG TABLET] 90 tablet 1    Sig: TAKE 1 TABLET BY MOUTH EVERYDAY AT BEDTIME     Urology:  Bladder Agents Passed - 10/11/2020 10:46 AM      Passed - Valid encounter within last 12 months    Recent Outpatient Visits           8 months ago Acute bronchitis with COPD [facility])   [facility] [place], [place], [place]   1 year ago Type 2 diabetes mellitus with hyperglycemia, without long-term current use of insulin [facility])   [facility] [place], [place], [place]   1 year ago Moderate persistent asthmatic bronchitis with acute exacerbation   [facility] [place], [place], [place]   1 year ago Type 2 diabetes mellitus without complication, without long-term current use of insulin [facility])   [facility] [place], [place], [place]   1 year ago Moderate persistent asthmatic bronchitis with acute exacerbation   [facility] [place], [place], [place]

## 2020-11-14 ENCOUNTER — Other Ambulatory Visit: Payer: Self-pay | Admitting: Family Medicine

## 2020-11-14 DIAGNOSIS — N3946 Mixed incontinence: Secondary | ICD-10-CM

## 2020-11-14 DIAGNOSIS — N3281 Overactive bladder: Secondary | ICD-10-CM

## 2020-12-06 DIAGNOSIS — N3281 Overactive bladder: Secondary | ICD-10-CM | POA: Diagnosis not present

## 2020-12-06 DIAGNOSIS — R69 Illness, unspecified: Secondary | ICD-10-CM | POA: Diagnosis not present

## 2020-12-06 DIAGNOSIS — K219 Gastro-esophageal reflux disease without esophagitis: Secondary | ICD-10-CM | POA: Diagnosis not present

## 2020-12-06 DIAGNOSIS — R32 Unspecified urinary incontinence: Secondary | ICD-10-CM | POA: Diagnosis not present

## 2020-12-06 DIAGNOSIS — R030 Elevated blood-pressure reading, without diagnosis of hypertension: Secondary | ICD-10-CM | POA: Diagnosis not present

## 2020-12-06 DIAGNOSIS — E1136 Type 2 diabetes mellitus with diabetic cataract: Secondary | ICD-10-CM | POA: Diagnosis not present

## 2020-12-06 DIAGNOSIS — G8929 Other chronic pain: Secondary | ICD-10-CM | POA: Diagnosis not present

## 2020-12-06 DIAGNOSIS — M1990 Unspecified osteoarthritis, unspecified site: Secondary | ICD-10-CM | POA: Diagnosis not present

## 2020-12-06 DIAGNOSIS — J449 Chronic obstructive pulmonary disease, unspecified: Secondary | ICD-10-CM | POA: Diagnosis not present

## 2020-12-10 ENCOUNTER — Other Ambulatory Visit: Payer: Self-pay | Admitting: Family Medicine

## 2020-12-10 DIAGNOSIS — N3946 Mixed incontinence: Secondary | ICD-10-CM

## 2020-12-10 DIAGNOSIS — N3281 Overactive bladder: Secondary | ICD-10-CM

## 2020-12-10 MED ORDER — OXYBUTYNIN CHLORIDE ER 10 MG PO TB24: ORAL_TABLET | ORAL | 0 refills | Status: DC

## 2020-12-10 NOTE — Addendum Note (Signed)
Addended by: Garrison [place] on: 12/10/2020 06:56 PM   Modules accepted: Orders

## 2020-12-10 NOTE — Telephone Encounter (Signed)
Requested Prescriptions  Pending Prescriptions Disp Refills  . oxybutynin (DITROPAN-XL) 10 MG 24 hr tablet [Pharmacy Med Name: OXYBUTYNIN CL ER 10 MG TABLET] 30 tablet 0    Sig: TAKE 1 TABLET BY MOUTH EVERYDAY AT BEDTIME     Urology:  Bladder Agents Passed - 12/10/2020 11:03 AM      Passed - Valid encounter within last 12 months    Recent Outpatient Visits          10 months ago Acute bronchitis with COPD [facility])   [facility] [place], [place], [place]   1 year ago Type 2 diabetes mellitus with hyperglycemia, without long-term current use of insulin [facility])   [facility] [place], [place], [place]   1 year ago Moderate persistent asthmatic bronchitis with acute exacerbation   [facility] [place], [place], [place]   1 year ago Type 2 diabetes mellitus without complication, without long-term current use of insulin [facility])   [facility] [place], [place], [place]   2 years ago Moderate persistent asthmatic bronchitis with acute exacerbation   [facility] [place], [place], [place]

## 2020-12-10 NOTE — Telephone Encounter (Signed)
RX unable to be sent electronically-after 2 attempts. Refill will be phoned in when pharmacy opens.

## 2020-12-10 NOTE — Telephone Encounter (Signed)
Initial attempt to refill clas: printed  Resent normal.

## 2020-12-13 ENCOUNTER — Telehealth: Payer: Self-pay | Admitting: Family Medicine

## 2020-12-13 DIAGNOSIS — Z1231 Encounter for screening mammogram for malignant neoplasm of breast: Secondary | ICD-10-CM

## 2020-12-13 NOTE — Telephone Encounter (Signed)
Referral Request - Has patient seen PCP for this complaint? no *If NO, is insurance requiring patient see PCP for this issue before PCP can refer them?no Referral for which specialty: mammogram Preferred provider/office: [facility] Reason for referral: annual mammo

## 2020-12-30 DIAGNOSIS — Z1231 Encounter for screening mammogram for malignant neoplasm of breast: Secondary | ICD-10-CM | POA: Diagnosis not present

## 2021-01-12 ENCOUNTER — Other Ambulatory Visit: Payer: Self-pay

## 2021-01-12 ENCOUNTER — Ambulatory Visit (INDEPENDENT_AMBULATORY_CARE_PROVIDER_SITE_OTHER): Payer: Medicare HMO | Admitting: Family Medicine

## 2021-01-12 ENCOUNTER — Encounter: Payer: Self-pay | Admitting: Family Medicine

## 2021-01-12 VITALS — BP 136/88 | HR 109 | Temp 98.6°F | Ht 64.0 in | Wt 218.0 lb

## 2021-01-12 DIAGNOSIS — R69 Illness, unspecified: Secondary | ICD-10-CM | POA: Diagnosis not present

## 2021-01-12 DIAGNOSIS — N3281 Overactive bladder: Secondary | ICD-10-CM | POA: Diagnosis not present

## 2021-01-12 DIAGNOSIS — E1165 Type 2 diabetes mellitus with hyperglycemia: Secondary | ICD-10-CM | POA: Diagnosis not present

## 2021-01-12 DIAGNOSIS — E1169 Type 2 diabetes mellitus with other specified complication: Secondary | ICD-10-CM | POA: Diagnosis not present

## 2021-01-12 DIAGNOSIS — J301 Allergic rhinitis due to pollen: Secondary | ICD-10-CM | POA: Diagnosis not present

## 2021-01-12 DIAGNOSIS — J4540 Moderate persistent asthma, uncomplicated: Secondary | ICD-10-CM | POA: Diagnosis not present

## 2021-01-12 DIAGNOSIS — N3946 Mixed incontinence: Secondary | ICD-10-CM

## 2021-01-12 DIAGNOSIS — F319 Bipolar disorder, unspecified: Secondary | ICD-10-CM | POA: Diagnosis not present

## 2021-01-12 DIAGNOSIS — E785 Hyperlipidemia, unspecified: Secondary | ICD-10-CM

## 2021-01-12 LAB — POCT GLYCOSYLATED HEMOGLOBIN (HGB A1C): Hemoglobin A1C: 13.8 % — AB (ref 4.0–5.6)

## 2021-01-12 MED ORDER — GLIPIZIDE ER 10 MG PO TB24: 10.0000 mg | ORAL_TABLET | Freq: Every day | ORAL | 1 refills | Status: DC

## 2021-01-12 MED ORDER — TRULICITY 1.5 MG/0.5ML ~~LOC~~ SOAJ: 1.5000 mg | SUBCUTANEOUS | 5 refills | Status: DC

## 2021-01-12 MED ORDER — ROSUVASTATIN CALCIUM 5 MG PO TABS: 5.0000 mg | ORAL_TABLET | Freq: Every day | ORAL | 1 refills | Status: DC

## 2021-01-12 MED ORDER — MONTELUKAST SODIUM 10 MG PO TABS: 10.0000 mg | ORAL_TABLET | Freq: Every day | ORAL | 1 refills | Status: DC

## 2021-01-12 MED ORDER — OXYBUTYNIN CHLORIDE ER 10 MG PO TB24: ORAL_TABLET | ORAL | 1 refills | Status: DC

## 2021-01-12 NOTE — Assessment & Plan Note (Signed)
Discussed importance of healthy weight management Discussed diet and exercise  

## 2021-01-12 NOTE — Progress Notes (Signed)
Established OV Notes   Patient: Joanna Bishop   DOB: 07/07/1955   65 y.o. Female  MRN: [id number] Visit Date: 01/12/2021  Today's healthcare provider: Shirlee Latch, MD   Chief Complaint  Patient presents with  . Annual Exam   Subjective     HPI  Diabetes Mellitus Type II, Follow-up  Lab Results  Component Value Date   HGBA1C 13.8 (A) 01/12/2021   HGBA1C 10.4 (A) 08/28/2019   HGBA1C 7.9 (A) 12/12/2018   Wt Readings from Last 3 Encounters:  01/12/21 218 lb (98.9 kg)  08/28/19 252 lb 9.6 oz (114.6 kg)  06/25/19 240 lb (108.9 kg)   Last seen for diabetes over a year ago. She reports poor compliance with treatment. Pt states she stopped all of her medications after the first of 2022 due to cost.  Symptoms: Yes fatigue No foot ulcerations  No appetite changes No nausea  Yes paresthesia of the feet  Yes polydipsia  No polyuria Yes visual disturbances   No vomiting     Home blood sugar records:  are not being checked.  Episodes of hypoglycemia? No    Current insulin regiment: None, pt stopped Trulicity in January. Most Recent Eye Exam: 04/04/2020   Pertinent Labs: Lab Results  Component Value Date   CHOL 168 12/20/2017   HDL 35 (L) 12/20/2017   LDLCALC 71 12/20/2017   TRIG 310 (H) 12/20/2017   CHOLHDL 4.8 (H) 12/20/2017   Lab Results  Component Value Date   NA 135 06/25/2019   K 4.4 06/25/2019   CREATININE 0.99 06/25/2019   GFRNONAA >60 06/25/2019   LABMICR 146.2 12/20/2017     --------------------------------------------------------------------------------------------------- Having difficult time affording her medications including Trulicity and Incruse  She did not tolerate metformin due to side effects in the past.  Past Medical History:  Diagnosis Date  . Anxiety   . Arthritis    shoulders, legs  . Asthma   . Depression   . Diabetes mellitus without complication (HCC)   . Hyperlipidemia   . Migraine headache    2x/month  .  Vertigo    Past Surgical History:  Procedure Laterality Date  . ABDOMINAL HYSTERECTOMY    . ARTHROPLASTY    . CHOLECYSTECTOMY    . FEMUR FRACTURE SURGERY    . TOTAL KNEE ARTHROPLASTY Left    Social History   Socioeconomic History  . Marital status: Widowed    Spouse name: Not on file  . Number of children: 3  . Years of education: Not on file  . Highest education level: Some college, no degree  Occupational History  . Occupation: disability  Tobacco Use  . Smoking status: Never  . Smokeless tobacco: Never  Vaping Use  . Vaping Use: Never used  Substance and Sexual Activity  . Alcohol use: Yes    Alcohol/week: 0.0 standard drinks    Comment: 1 drink every 3 months  . Drug use: Never  . Sexual activity: Not Currently  Other Topics Concern  . Not on file  Social History Narrative  . Not on file   Social Determinants of Health   Financial Resource Strain: Low Risk   . Difficulty of Paying Living Expenses: Not hard at all  Food Insecurity: No Food Insecurity  . Worried About Programme researcher, broadcasting/film/video in the Last Year: Never true  . Ran Out of Food in the Last Year: Never true  Transportation Needs: No Transportation Needs  . Lack of Transportation (Medical): No  .  Lack of Transportation (Non-Medical): No  Physical Activity: Insufficiently Active  . Days of Exercise per Week: 4 days  . Minutes of Exercise per Session: 10 min  Stress: Stress Concern Present  . Feeling of Stress : To some extent  Social Connections: Socially Isolated  . Frequency of Communication with Friends and Family: More than three times a week  . Frequency of Social Gatherings with Friends and Family: Three times a week  . Attends Religious Services: Never  . Active Member of Clubs or Organizations: No  . Attends Banker Meetings: Never  . Marital Status: Widowed  Intimate Partner Violence: Not At Risk  . Fear of Current or Ex-Partner: No  . Emotionally Abused: No  . Physically  Abused: No  . Sexually Abused: No   Family Status  Relation Name Status  . Mother  Deceased  . Father  Deceased  . MGM  Deceased  . PGM  Deceased   Family History  Problem Relation Age of Onset  . Diabetes Mother   . COPD Mother   . Diabetes Father   . Coronary artery disease Father   . Coronary artery disease Maternal Grandmother   . Heart attack Maternal Grandmother   . Diabetes Paternal Grandmother    Allergies  Allergen Reactions  . Penicillins     Patient Care Team: Alfredia Ferguson, PA-C as PCP - General (Physician Assistant) Verne Carrow, OD (Optometry)   Medications: Outpatient Medications Prior to Visit  Medication Sig  . albuterol (VENTOLIN HFA) 108 (90 Base) MCG/ACT inhaler INHALE 1-2 PUFFS INTO THE LUNGS EVERY 4 TO 6 HOURS AS NEEDED FOR WHEEZING OR SHORTNESS OF BREATH.  Marland Kitchen aspirin 81 MG tablet Take by mouth daily.  . divalproex (DEPAKOTE ER) 500 MG 24 hr tablet TAKE 2 TABLETS (1,000 MG TOTAL) BY MOUTH AT BEDTIME. (Patient not taking: Reported on 01/12/2021)  . fluticasone (FLONASE) 50 MCG/ACT nasal spray Place 2 sprays into the nose daily.  . INCRUSE ELLIPTA 62.5 MCG/INH AEPB TAKE 1 PUFF BY MOUTH EVERY DAY  . Multiple Vitamin (MULTI-VITAMIN) tablet Take by mouth.  . traMADol (ULTRAM) 50 MG tablet Take by mouth every 6 (six) hours as needed.  . benztropine (COGENTIN) 0.5 MG tablet TAKE 1 TABLET BY MOUTH 2 TIMES DAILY. (Patient not taking: Reported on 01/12/2021)  . calcium carbonate (OS-CAL) 600 MG TABS tablet Take by mouth. (Patient not taking: Reported on 01/12/2021)  . NON FORMULARY Bladder Control Supplement Capsule daily (Patient not taking: Reported on 01/12/2021)  . [DISCONTINUED] glipiZIDE (GLUCOTROL XL) 10 MG 24 hr tablet TAKE 1 TABLET (10 MG TOTAL) BY MOUTH DAILY WITH BREAKFAST. (Patient not taking: Reported on 01/12/2021)  . [DISCONTINUED] montelukast (SINGULAIR) 10 MG tablet TAKE 1 TABLET BY MOUTH EVERY DAY (Patient not taking: Reported on 01/12/2021)   . [DISCONTINUED] oxybutynin (DITROPAN-XL) 10 MG 24 hr tablet TAKE 1 TABLET BY MOUTH EVERYDAY AT BEDTIME (Patient not taking: Reported on 01/12/2021)  . [DISCONTINUED] rosuvastatin (CRESTOR) 5 MG tablet Take 1 tablet (5 mg total) by mouth daily. (Patient not taking: Reported on 01/12/2021)  . [DISCONTINUED] TRULICITY 1.5 MG/0.5ML SOPN INJECT 0.5 MLS (1.5 MG TOTAL) INTO THE SKIN ONCE A WEEK. (Patient not taking: Reported on 01/12/2021)   No facility-administered medications prior to visit.    Review of Systems  Constitutional: Negative.   HENT: Negative.    Respiratory: Negative.    Cardiovascular: Negative.   Gastrointestinal: Negative.   Endocrine: Positive for polydipsia. Negative for cold intolerance, heat intolerance, polyphagia and  polyuria.  Neurological:  Positive for dizziness and numbness. Negative for light-headedness and headaches.     Objective    BP 136/88 (BP Location: Right Arm, Patient Position: Sitting, Cuff Size: Large)   Pulse (!) 109   Temp 98.6 F (37 C) (Oral)   Ht 5\' 4"  (1.626 m)   Wt 218 lb (98.9 kg)   SpO2 100%   BMI 37.42 kg/m    Physical Exam Vitals reviewed.  Constitutional:      General: She is not in acute distress.    Appearance: Normal appearance. She is well-developed. She is not diaphoretic.  HENT:     Head: Normocephalic and atraumatic.  Eyes:     General: No scleral icterus.    Conjunctiva/sclera: Conjunctivae normal.  Neck:     Thyroid: No thyromegaly.  Cardiovascular:     Rate and Rhythm: Normal rate and regular rhythm.     Pulses: Normal pulses.     Heart sounds: Normal heart sounds. No murmur heard. Pulmonary:     Effort: Pulmonary effort is normal. No respiratory distress.     Breath sounds: Normal breath sounds. No wheezing, rhonchi or rales.  Musculoskeletal:     Cervical back: Neck supple.     Right lower leg: No edema.     Left lower leg: No edema.  Lymphadenopathy:     Cervical: No cervical adenopathy.  Skin:     General: Skin is warm and dry.     Findings: No rash.  Neurological:     Mental Status: She is alert and oriented to person, place, and time. Mental status is at baseline.  Psychiatric:        Mood and Affect: Mood normal.        Behavior: Behavior normal.      Last depression screening scores PHQ 2/9 Scores 01/12/2021 03/02/2020 07/07/2018  PHQ - 2 Score 4 0 2  PHQ- 9 Score 20 - 10    Results for orders placed or performed in visit on 01/12/21  POCT glycosylated hemoglobin (Hb A1C)  Result Value Ref Range   Hemoglobin A1C 13.8 (A) 4.0 - 5.6 %    Assessment & Plan       Problem List Items Addressed This Visit       Respiratory   AB (asthmatic bronchitis)    Continue incruse Needs medication assistance - will refer to CCM PharmD      Relevant Medications   montelukast (SINGULAIR) 10 MG tablet   Other Relevant Orders   AMB Referral to Community Care Coordinaton     Endocrine   Hyperlipidemia associated with type 2 diabetes mellitus (HCC)    Not currently on statin - will resume Recheck FLP and CMP Goal LDL <70      Relevant Medications   Dulaglutide (TRULICITY) 1.5 MG/0.5ML SOPN   rosuvastatin (CRESTOR) 5 MG tablet   glipiZIDE (GLUCOTROL XL) 10 MG 24 hr tablet   Other Relevant Orders   AMB Referral to Community Care Coordinaton   Comprehensive metabolic panel   Lipid panel   Type 2 diabetes mellitus with hyperglycemia, without long-term current use of insulin (HCC) - Primary    Uncontrolled with hyperglycemia Needs to resume trulicity and glipizide Did not tolerate metformin in the past due to GI side effects will hook her up with CCM PharmD to help with medication affordability Needs to resume statin also Will need to focus on screenigns at next visit in 3 months and recheck a1c  Relevant Medications   Dulaglutide (TRULICITY) 1.5 MG/0.5ML SOPN   rosuvastatin (CRESTOR) 5 MG tablet   glipiZIDE (GLUCOTROL XL) 10 MG 24 hr tablet   Other Relevant Orders    POCT glycosylated hemoglobin (Hb A1C) (Completed)   AMB Referral to Community Care Coordinaton     Genitourinary   OAB (overactive bladder)    Chronic and uncontrolled Resume oxybutinin Consider mybetriq in the future      Relevant Medications   oxybutynin (DITROPAN-XL) 10 MG 24 hr tablet     Other   Bipolar depression (HCC)    Has appt with Psych next week She has not been taking her meds or seeing her psychiatrist and knows that she needs to resume She is very uncontrolled at this point A lot of family stress contributes      Relevant Orders   AMB Referral to Community Care Coordinaton   Morbid obesity (HCC)    Discussed importance of healthy weight management Discussed diet and exercise       Relevant Medications   Dulaglutide (TRULICITY) 1.5 MG/0.5ML SOPN   glipiZIDE (GLUCOTROL XL) 10 MG 24 hr tablet   Other Visit Diagnoses     Mixed stress and urge urinary incontinence       Relevant Medications   oxybutynin (DITROPAN-XL) 10 MG 24 hr tablet   Seasonal allergic rhinitis due to pollen       Relevant Medications   montelukast (SINGULAIR) 10 MG tablet        Return in about 3 months (around 04/14/2021) for With new PCP, chronic disease f/u.     Total time spent on today's visit was greater than 40 minutes, including both face-to-face time and nonface-to-face time personally spent on review of chart (labs and imaging), discussing labs and goals, discussing further work-up, treatment options, referrals to specialist if needed, reviewing outside records of pertinent, answering patient's questions, and coordinating care.   I, Shirlee Latch, MD, have reviewed all documentation for this visit. The documentation on 01/12/21 for the exam, diagnosis, procedures, and orders are all accurate and complete.   Hermine Feria, Marzella Schlein, MD, MPH [facility]

## 2021-01-12 NOTE — Assessment & Plan Note (Signed)
Uncontrolled with hyperglycemia Needs to resume trulicity and glipizide Did not tolerate metformin in the past due to GI side effects will hook her up with CCM PharmD to help with medication affordability Needs to resume statin also Will need to focus on screenigns at next visit in 3 months and recheck a1c

## 2021-01-12 NOTE — Assessment & Plan Note (Signed)
Not currently on statin - will resume Recheck FLP and CMP Goal LDL <70

## 2021-01-12 NOTE — Assessment & Plan Note (Signed)
Has appt with Psych next week She has not been taking her meds or seeing her psychiatrist and knows that she needs to resume She is very uncontrolled at this point A lot of family stress contributes

## 2021-01-12 NOTE — Assessment & Plan Note (Signed)
Chronic and uncontrolled Resume oxybutinin Consider mybetriq in the future

## 2021-01-12 NOTE — Assessment & Plan Note (Signed)
Continue incruse Needs medication assistance - will refer to CCM PharmD

## 2021-01-13 ENCOUNTER — Telehealth: Payer: Self-pay | Admitting: *Deleted

## 2021-01-13 NOTE — Chronic Care Management (AMB) (Signed)
  Chronic Care Management   Outreach Note  01/13/2021 Name: Joanna Bishop MRN: [id number] DOB: 30-Apr-1955  Joanna Bishop is a 65 y.o. year old female who is a primary care patient of Alfredia Ferguson, [place]. I reached out to Joanna Bishop by phone today in response to a referral sent by Joanna Bishop's primary care provider.  An unsuccessful telephone outreach was attempted today. The patient was referred to the case management team for assistance with care management and care coordination.   Follow Up Plan: A HIPAA compliant phone message was left for the patient providing contact information and requesting a return call.  If patient returns call to provider office, please advise to call Embedded Care Management Care Guide Joanna Bishop at [phone]  Joanna Bishop, CCMA Care Guide, Embedded Care Coordination [facility] Management  Direct Dial: [phone]

## 2021-01-17 NOTE — Chronic Care Management (AMB) (Signed)
  Chronic Care Management   Note  01/17/2021 Name: Joanna Bishop MRN: [id number] DOB: 03/17/1955  Joanna Bishop is a 65 y.o. year old female who is a primary care patient of [name], [place]. I reached out to [name] by phone today in response to a referral sent by Joanna Bishop PCP.  Joanna Bishop was given information about Chronic Care Management services today including:  CCM service includes personalized support from designated clinical staff supervised by her physician, including individualized plan of care and coordination with other care providers 24/7 contact phone numbers for assistance for urgent and routine care needs. Service will only be billed when office clinical staff spend 20 minutes or more in a month to coordinate care. Only one practitioner may furnish and bill the service in a calendar month. The patient may stop CCM services at any time (effective at the end of the month) by phone call to the office staff. The patient is responsible for co-pay (up to 20% after annual deductible is met) if co-pay is required by the individual health plan.   Patient agreed to services and verbal consent obtained.   Follow up plan: Telephone appointment with care management team member scheduled for: 01/23/2021  Julian Hy, [place] Management  Direct Dial: [phone]

## 2021-01-23 ENCOUNTER — Ambulatory Visit (INDEPENDENT_AMBULATORY_CARE_PROVIDER_SITE_OTHER)

## 2021-01-23 DIAGNOSIS — F319 Bipolar disorder, unspecified: Secondary | ICD-10-CM

## 2021-01-23 DIAGNOSIS — N3281 Overactive bladder: Secondary | ICD-10-CM

## 2021-01-23 DIAGNOSIS — J4540 Moderate persistent asthma, uncomplicated: Secondary | ICD-10-CM

## 2021-01-23 DIAGNOSIS — E1165 Type 2 diabetes mellitus with hyperglycemia: Secondary | ICD-10-CM

## 2021-01-23 DIAGNOSIS — E1169 Type 2 diabetes mellitus with other specified complication: Secondary | ICD-10-CM

## 2021-01-23 MED ORDER — BLOOD GLUCOSE METER KIT: PACK | 0 refills | Status: AC

## 2021-01-23 NOTE — Progress Notes (Signed)
Chronic Care Management Pharmacy Note  01/23/2021 Name:  Joanna Bishop MRN:  [id number] DOB:  [birth year]  Summary: Patient presents for Initial CCM consult. She stopped taking all of her medications in January and has only recently restarted them. She reports multiple concerns including frequent migraines, nausea, and fatigue. She struggles to afford her Incruse or Trulicity.   Recommendations/Changes made from today's visit: -START PAP for Trulicity, Incruse  -Resent glucometer kit to pharmacy. Resume checking blood sugars once daily before breakfast.   Plan: CPP follow-up 1 month  Recommended Problem List Changes:  Add: Migraine  Subjective: Joanna Bishop is an 65 y.o. year old female who is a primary patient of [name], [name], [place].  The CCM team was consulted for assistance with disease management and care coordination needs.    Engaged with patient by telephone for initial visit in response to provider referral for pharmacy case management and/or care coordination services.   Consent to Services:  The patient was given the following information about Chronic Care Management services today, agreed to services, and gave verbal consent: 1. CCM service includes personalized support from designated clinical staff supervised by the primary care provider, including individualized plan of care and coordination with other care providers 2. 24/7 contact phone numbers for assistance for urgent and routine care needs. 3. Service will only be billed when office clinical staff spend 20 minutes or more in a month to coordinate care. 4. Only one practitioner may furnish and bill the service in a calendar month. 5.The patient may stop CCM services at any time (effective at the end of the month) by phone call to the office staff. 6. The patient will be responsible for cost sharing (co-pay) of up to 20% of the service fee (after annual deductible is met). Patient agreed to services and consent  obtained.  Patient Care Team: [name], PA-C as PCP - General (Physician Assistant) Ocie Doyne, [place] (Optometry) Germaine Pomfret, [facility] (Pharmacist)  Recent office visits: 01/12/21: Patient presented to Dr. Brita Romp for follow-up. A1c 13.8%. Not taking medication since January.   Recent consult visits: None noted.  Hospital visits: None in previous 6 months  Objective:  Lab Results  Component Value Date   CREATININE 0.99 06/25/2019   BUN 24 (H) 06/25/2019   GFRNONAA >60 06/25/2019   GFRAA >60 06/25/2019   NA 135 06/25/2019   K 4.4 06/25/2019   CALCIUM 9.5 06/25/2019   CO2 24 06/25/2019   GLUCOSE 337 (H) 06/25/2019    Lab Results  Component Value Date/Time   HGBA1C 13.8 (A) 01/12/2021 01:33 PM   HGBA1C 10.4 (A) 08/28/2019 06:38 PM   HGBA1C 8.8 (H) 03/12/2018 09:32 AM   HGBA1C 6.4 (H) 12/20/2017 09:24 AM    Last diabetic Eye exam:  Lab Results  Component Value Date/Time   HMDIABEYEEXA No Retinopathy 04/04/2020 12:00 AM    Last diabetic Foot exam: No results found for: HMDIABFOOTEX   Lab Results  Component Value Date   CHOL 168 12/20/2017   HDL 35 (L) 12/20/2017   LDLCALC 71 12/20/2017   TRIG 310 (H) 12/20/2017   CHOLHDL 4.8 (H) 12/20/2017    Hepatic Function Latest Ref Rng & Units 03/12/2018 12/20/2017 10/14/2015  Total Protein 6.0 - 8.5 g/dL 6.5 6.9 6.7  Albumin 3.6 - 4.8 g/dL 4.2 4.7 4.5  AST 0 - 40 IU/L 34 34 31  ALT 0 - 32 IU/L 45(H) 39(H) 53(H)  Alk Phosphatase 39 - 117 IU/L 95 79 91  Total Bilirubin 0.0 - 1.2 mg/dL 0.4 0.5 0.6    Lab Results  Component Value Date/Time   TSH 2.510 10/14/2015 11:41 AM   TSH 3.030 07/12/2015 07:23 PM   TSH 1.730 09/13/2014 10:26 AM    CBC Latest Ref Rng & Units 06/25/2019 03/12/2018 12/20/2017  WBC 4.0 - 10.5 K/uL 9.5 8.1 9.5  Hemoglobin 12.0 - 15.0 g/dL 14.2 13.6 14.5  Hematocrit 36.0 - 46.0 % 41.6 40.6 42.6  Platelets 150 - 400 K/uL 195 191 247    Lab Results  Component Value Date/Time   VD25OH  29.0 (L) 10/14/2015 11:41 AM    Clinical ASCVD: No  The ASCVD Risk score (Arnett DK, et al., 2019) failed to calculate for the following reasons:   Cannot find a previous HDL lab   Cannot find a previous total cholesterol lab    Depression screen [place] 2/9 01/12/2021 03/02/2020 07/07/2018  Decreased Interest 2 0 1  Down, Depressed, Hopeless 2 0 1  PHQ - 2 Score 4 0 2  Altered sleeping 3 - 2  Tired, decreased energy 3 - 2  Change in appetite 2 - 1  Feeling bad or failure about yourself  2 - 2  Trouble concentrating 3 - 1  Moving slowly or fidgety/restless 2 - 0  Suicidal thoughts 1 - 0  PHQ-9 Score 20 - 10  Difficult doing work/chores Very difficult - Very difficult    Social History   Tobacco Use  Smoking Status Never  Smokeless Tobacco Never   BP Readings from Last 3 Encounters:  01/12/21 136/88  08/28/19 (!) 143/85  06/25/19 (!) 178/100   Pulse Readings from Last 3 Encounters:  01/12/21 (!) 109  08/28/19 (!) 108  06/25/19 (!) 108   Wt Readings from Last 3 Encounters:  01/12/21 218 lb (98.9 kg)  08/28/19 252 lb 9.6 oz (114.6 kg)  06/25/19 240 lb (108.9 kg)   BMI Readings from Last 3 Encounters:  01/12/21 37.42 kg/m  08/28/19 42.69 kg/m  06/25/19 40.56 kg/m    Assessment/Interventions: Review of patient past medical history, allergies, medications, health status, including review of consultants reports, laboratory and other test data, was performed as part of comprehensive evaluation and provision of chronic care management services.   SDOH:  (Social Determinants of Health) assessments and interventions performed: Yes SDOH Interventions    Flowsheet Row Most Recent Value  SDOH Interventions   Financial Strain Interventions Other (Comment)  [PAP]      SDOH Screenings   Alcohol Screen: Low Risk    Last Alcohol Screening Score (AUDIT): 1  Depression (PHQ2-9): Medium Risk   PHQ-2 Score: 20  Financial Resource Strain: High Risk   Difficulty of Paying Living  Expenses: Very hard  Food Insecurity: No Food Insecurity   Worried About Charity fundraiser in the Last Year: Never true   Ran Out of Food in the Last Year: Never true  Housing: Low Risk    Last Housing Risk Score: 0  Physical Activity: Insufficiently Active   Days of Exercise per Week: 4 days   Minutes of Exercise per Session: 10 min  Social Connections: Socially Isolated   Frequency of Communication with Friends and Family: More than three times a week   Frequency of Social Gatherings with Friends and Family: Three times a week   Attends Religious Services: Never   Active Member of Clubs or Organizations: No   Attends Archivist Meetings: Never   Marital Status: Widowed  Stress: Stress Concern  Present   Feeling of Stress : To some extent  Tobacco Use: Low Risk    Smoking Tobacco Use: Never   Smokeless Tobacco Use: Never   Passive Exposure: Not on file  Transportation Needs: No Transportation Needs   Lack of Transportation (Medical): No   Lack of Transportation (Non-Medical): No    CCM Care Plan  Allergies  Allergen Reactions   Penicillins     Medications Reviewed Today     Reviewed by Germaine Pomfret, [facility] (Pharmacist) on 01/23/21 at 1138  Med List Status: <None>   Medication Order Taking? Sig Documenting Provider Last Dose Status Informant  albuterol (VENTOLIN HFA) 108 (90 Base) MCG/ACT inhaler [id number] Yes INHALE 1-2 PUFFS INTO THE LUNGS EVERY 4 TO 6 HOURS AS NEEDED FOR WHEEZING OR SHORTNESS OF BREATH. Mar Daring, [place] Taking Active   aspirin 81 MG tablet [id number] Yes Take by mouth daily. [provider] Taking Active            Med Note Gillie Manners Aug 02, 2014  4:58 PM) Received from: [place]:   aspirin-acetaminophen-caffeine (EXCEDRIN MIGRAINE) [phone] MG tablet [id number] Yes Take 1 tablet by mouth every 6 (six) hours as needed for headache. [provider] Taking Active    blood glucose meter kit and supplies [id number] Yes Dispense based on patient and insurance preference. Use daily as directed. 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 Virginia Crews, MD  Active   Azucena Freed Serrata (BOSWELLIA PO) [id number] Yes Take 500 mg by mouth in the morning and at bedtime. [provider] Taking Active   Dulaglutide (TRULICITY) 1.5 SP/2.3RA SOPN [id number] Yes Inject 1.5 mg into the skin once a week. Virginia Crews, MD Taking Active   glipiZIDE (GLUCOTROL XL) 10 MG 24 hr tablet [id number] Yes Take 1 tablet (10 mg total) by mouth daily with breakfast. Virginia Crews, MD Taking Active   INCRUSE ELLIPTA 62.5 MCG/INH AEPB [id number] Yes TAKE 1 PUFF BY MOUTH EVERY DAY Mar Daring, [place] Taking Active     Discontinued 06/25/19 1840 (No longer needed (for PRN medications))   montelukast (SINGULAIR) 10 MG tablet [id number] Yes Take 1 tablet (10 mg total) by mouth daily. Virginia Crews, MD Taking Active   Multiple Vitamin (MULTIVITAMIN WITH MINERALS) TABS tablet [id number] Yes Take 1 tablet by mouth daily. Centrum Silver Women's 50+ [provider] Taking Active   naproxen sodium (ALEVE) 220 MG tablet [id number] Yes Take 220 mg by mouth daily. [provider] Taking Active   NON FORMULARY [id number] Yes Take 1 capsule by mouth in the morning and at bedtime. AZO Bladder Control Supplement [provider] Taking Active   oxybutynin (DITROPAN-XL) 10 MG 24 hr tablet [id number] Yes TAKE 1 TABLET BY MOUTH EVERYDAY AT BEDTIME Bacigalupo, Dionne Bucy, MD Taking Active   rosuvastatin (CRESTOR) 5 MG tablet [id number] Yes Take 1 tablet (5 mg total) by mouth daily. Virginia Crews, MD Taking Active   traMADol Veatrice Bourbon) 50 MG tablet [id number] Yes Take by mouth every 6 (six) hours as needed. [provider] Taking Active     Discontinued 06/25/19 1840             Patient Active Problem List   Diagnosis Date Noted   OAB (overactive bladder)  01/12/2021   Morbid obesity ([place]) 08/28/2019   Stiffness of right knee 06/18/2018   Type 2 diabetes mellitus with hyperglycemia, without long-term current use of insulin ([place]) 10/17/2015   Carpal  tunnel syndrome of left wrist 10/14/2015   Bipolar depression ([place]) 07/12/2015   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   AB (asthmatic bronchitis) 08/02/2014   Hyperlipidemia associated with type 2 diabetes mellitus ([place]) 08/02/2014   History of suicidal ideation 08/02/2014   Avitaminosis D 08/02/2014   Generalized hyperhidrosis 08/02/2014   Airway hyperreactivity 08/02/2014   Anxiety disorder 08/02/2014   Insomnia 08/02/2014   Major depressive disorder, single episode 08/02/2014    Immunization History  Administered Date(s) Administered   Influenza,inj,Quad PF,6+ Mos 01/06/2018, 12/12/2018   Pneumococcal Polysaccharide-23 07/30/2012   Tdap 07/28/2009    Conditions to be addressed/monitored:  Hyperlipidemia, Diabetes, Asthma, Overactive Bladder, and Bipolar Disorder, Chronic Pain, Chronic Migraines  Care Plan : General Pharmacy (Adult)  Updates made by Germaine Pomfret, [place] since 01/23/2021 12:00 AM     Problem: Hyperlipidemia, Diabetes, Asthma, Overactive Bladder, and Bipolar Disorder, Chronic Pain, Chronic Migraines   Priority: High     Long-Range Goal: Patient-Specific Goal   Start Date: 01/23/2021  Expected End Date: 01/23/2022  This Visit's Progress: On track  Priority: High  Note:   Current Barriers:  Unable to independently afford treatment regimen Unable to achieve control of diabetes   Pharmacist Clinical Goal(s):  Patient will verbalize ability to afford treatment regimen achieve control of Diabetes as evidenced by A1c less than 8% through collaboration with PharmD and provider.   Interventions: 1:1 collaboration with [name], PA-C regarding development and update of comprehensive plan of care as evidenced by provider attestation and  co-signature Inter-disciplinary care team collaboration (see longitudinal plan of care) Comprehensive medication review performed; medication list updated in electronic medical record  Hyperlipidemia: (LDL goal < 70) -Uncontrolled -Current treatment: Rosuvastatin 5 mg daily (AM)  -Medications previously tried: NA  -Patient to recheck fasting lipid panel later this week. Will defer judgement on cholesterol dose until after recheck.  -Recommended to continue current medication  Diabetes (A1c goal <8%) -Uncontrolled -Current medications: Glipizide XL 10 mg daily  Trulicity 1.5 mg weekly  -Medications previously tried: Metformin  -Patient reports nausea since resuming glipizide and Trulicity, may be related to resuming high dose of Trulicity  -Current home glucose readings fasting glucose: Not monitoring, lost device.  -Reports hyperglycemic symptoms: Fatigue, polydipsia, paresthesia, visual disturbances   -Current meal patterns: Frozen/fresh Fruits + vegetables. Avoids canned food. Red meat 2x weekly. Chicken. Identifies starches as her weakness.  breakfast: 2-3 Eggs  (scrambled OR boiled)  snacks: Fruit (blackberries, bananas, grapes, apples, cherries, melon) -Current exercise: Limited by leg pain following knee replacement/femur fracture. Chair exercises 3 times weekly.  -Patient thinks glipizide resulted in weakness leading to a fall. Anticipate switching glipizide for alternative agent, will defer for now.  -START PAP for Trulicity  -Resent glucometer kit to pharmacy. Resume checking blood sugars once daily before breakfast.  -Recommended to continue current medication  Asthma (Goal: control symptoms and prevent exacerbations) -Not ideally controlled -Current treatment  Ventolin HFA 1-2 puffs every 4-6 hours as needed Incruse 1 puff daily  Montelukast 10 mg daily  -Medications previously tried: NA  -Exacerbations requiring treatment in last 6 months: None -Patient denies  consistent use of maintenance inhaler. Takes 1-2 times monthly costly -Frequency of rescue inhaler use: 1-2 weekly -Counseled on Benefits of consistent maintenance inhaler use -Start PAP for Incruse -Recommended to continue current medication  Bipolar Disorder (Goal: Achieve symptom remission) -Uncontrolled -Psychiatrist appointment scheduled for next week -Current treatment: None  -Medications previously tried/failed: Divalproex, Benztropine -Counseled on importance of keeping upcoming  psychiatry appointment   Overactive Bladder (Goal: Minimize incontinence symptoms) -Uncontrolled -Current treatment  Oxybutynin XL 10 mg nightly  AZO Bladder control 1 capsule twice daily  -Medications previously tried: NA  -Could consider Mybretriq if it would be affordable for patient.  -Recommended to continue current medication  Migraines (Goal: Prevent migraines) -Uncontrolled -Current treatment  Excedrin Migraine Relief ([employer] brand) -Medications previously tried: NA  -Migraines occurring more frequently, requiring treatment 2-3 weekly did not discuss at last PCP follow-up.  -Counseled on risk of frequent utilization of aspirin + naproxen.  -Recommended to continue current medication  Chronic Pain (Goal: Minimize symptoms) -Controlled -Managed by Carlynn Spry, PA-C (Ortho) -Current treatment  Boswellia extract 500 mg twice daily  Tramadol 50 mg q6h PRN  -Medications previously tried: NA -Patient also reports CBD gummy use.  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  -Requires rare use of tramadol   -Recommended to continue current medication  Patient Goals/Self-Care Activities Patient will:  - check glucose daily before breakfast, document, and provide at future appointments Complete patient assistance applications for Trulicity, Incruse  Follow Up Plan: Telephone follow up appointment with care management team member scheduled for:   02/22/2021 at 10:15 AM      Medication Assistance: Application for Trulicity, Incruse  medication assistance program. in process.  Anticipated assistance start date TBD.  See plan of care for additional detail.  Compliance/Adherence/Medication fill history: Care Gaps: Covid Vaccine  HIV Screening  Shingrix  Pneumonia  Foot Exam  Influenza   Star-Rating Drugs: Rosuvastatin 5 mg: LF 02/05/2020 for 90-DS   Patient's preferred pharmacy is:  [id number] [place] [place] [address] [id number]: [phone]: [phone] [facility] [place] [address] [place]Phone: [phone]: [phone] Uses pill box? Yes Pt endorses 100% compliance  We discussed: Verbal consent obtained for [facility] enhanced pharmacy services (medication synchronization, adherence packaging, delivery coordination). A medication sync plan was created to allow patient to get all medications delivered once every 30 to 90 days per patient preference. Patient understands they have freedom to choose pharmacy and clinical pharmacist will coordinate care between all prescribers and [facility]. Patient decided to: Utilize [facility] for medication synchronization, packaging and delivery  Care Plan and Follow Up Patient Decision:  Patient agrees to Care Plan and Follow-up.  Plan: Telephone follow up appointment with care management team member scheduled for:  02/22/2021 at 10:15 AM  AJunius Argyle PharmD, BPara March CDespardPharmacist Practitioner  BSt [phone]

## 2021-01-23 NOTE — Patient Instructions (Addendum)
Visit Information It was great speaking with you today!  Please let me know if you have any questions about our visit.   Goals Addressed             This Visit's Progress    Monitor and Manage My Blood Sugar-Diabetes Type 2       Timeframe:  Long-Range Goal Priority:  High Start Date: 01/23/2021                            Expected End Date: 01/23/2022                      Follow Up within 30 days   - check blood sugar before breakfast - check blood sugar if I feel it is too high or too low - enter blood sugar readings and medication or insulin into daily log - take the blood sugar log to all doctor visits    Why is this important?   Checking your blood sugar at home helps to keep it from getting very high or very low.  Writing the results in a diary or log helps the doctor know how to care for you.  Your blood sugar log should have the time, date and the results.  Also, write down the amount of insulin or other medicine that you take.  Other information, like what you ate, exercise done and how you were feeling, will also be helpful.     Notes:         Patient Care Plan: General Pharmacy (Adult)     Problem Identified: Hyperlipidemia, Diabetes, Asthma, Overactive Bladder, and Bipolar Disorder, Chronic Pain, Chronic Migraines   Priority: High     Long-Range Goal: Patient-Specific Goal   Start Date: 01/23/2021  Expected End Date: 01/23/2022  This Visit's Progress: On track  Priority: High  Note:   Current Barriers:  Unable to independently afford treatment regimen Unable to achieve control of diabetes   Pharmacist Clinical Goal(s):  Patient will verbalize ability to afford treatment regimen achieve control of Diabetes as evidenced by A1c less than 8% through collaboration with PharmD and provider.   Interventions: 1:1 collaboration with [name], PA-C regarding development and update of comprehensive plan of care as evidenced by provider attestation and  co-signature Inter-disciplinary care team collaboration (see longitudinal plan of care) Comprehensive medication review performed; medication list updated in electronic medical record  Hyperlipidemia: (LDL goal < 70) -Uncontrolled -Current treatment: Rosuvastatin 5 mg daily (AM)  -Medications previously tried: NA  -Patient to recheck fasting lipid panel later this week. Will defer judgement on cholesterol dose until after recheck.  -Recommended to continue current medication  Diabetes (A1c goal <8%) -Uncontrolled -Current medications: Glipizide XL 10 mg daily  Trulicity 1.5 mg weekly  -Medications previously tried: Metformin  -Patient reports nausea since resuming glipizide and Trulicity, may be related to resuming high dose of Trulicity  -Current home glucose readings fasting glucose: Not monitoring, lost device.  -Reports hyperglycemic symptoms: Fatigue, polydipsia, paresthesia, visual disturbances   -Current meal patterns: Frozen/fresh Fruits + vegetables. Avoids canned food. Red meat 2x weekly. Chicken. Identifies starches as her weakness.  breakfast: 2-3 Eggs  (scrambled OR boiled)  snacks: Fruit (blackberries, bananas, grapes, apples, cherries, melon) -Current exercise: Limited by leg pain following knee replacement/femur fracture. Chair exercises 3 times weekly.  -Patient thinks glipizide resulted in weakness leading to a fall. Anticipate switching glipizide for alternative agent, will defer  for now.  -START PAP for Trulicity  -Resent glucometer kit to pharmacy. Resume checking blood sugars once daily before breakfast.  -Recommended to continue current medication  Asthma (Goal: control symptoms and prevent exacerbations) -Not ideally controlled -Current treatment  Ventolin HFA 1-2 puffs every 4-6 hours as needed Incruse 1 puff daily  Montelukast 10 mg daily  -Medications previously tried: NA  -Exacerbations requiring treatment in last 6 months: None -Patient denies  consistent use of maintenance inhaler. Takes 1-2 times monthly costly -Frequency of rescue inhaler use: 1-2 weekly -Counseled on Benefits of consistent maintenance inhaler use -Start PAP for Incruse -Recommended to continue current medication  Bipolar Disorder (Goal: Achieve symptom remission) -Uncontrolled -Psychiatrist appointment scheduled for next week -Current treatment: None  -Medications previously tried/failed: Divalproex, Benztropine -Counseled on importance of keeping upcoming psychiatry appointment   Overactive Bladder (Goal: Minimize incontinence symptoms) -Uncontrolled -Current treatment  Oxybutynin XL 10 mg nightly  AZO Bladder control 1 capsule twice daily  -Medications previously tried: NA  -Could consider Mybretriq if it would be affordable for patient.  -Recommended to continue current medication  Migraines (Goal: Prevent migraines) -Uncontrolled -Current treatment  Excedrin Migraine Relief ([employer] brand) -Medications previously tried: NA  -Migraines occurring more frequently, requiring treatment 2-3 weekly did not discuss at last PCP follow-up.  -Counseled on risk of frequent utilization of aspirin + naproxen.  -Recommended to continue current medication  Chronic Pain (Goal: Minimize symptoms) -Controlled -Managed by Carlynn Spry, PA-C (Ortho) -Current treatment  Boswellia extract 500 mg twice daily  Tramadol 50 mg q6h PRN  -Medications previously tried: NA -Patient also reports CBD gummy use.  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  -Requires rare use of tramadol   -Recommended to continue current medication  Patient Goals/Self-Care Activities Patient will:  - check glucose daily before breakfast, document, and provide at future appointments Complete patient assistance applications for Trulicity, Incruse  Follow Up Plan: Telephone follow up appointment with care management team member scheduled for:   02/22/2021 at 10:15 AM    Ms. Sigel was given information about Chronic Care Management services today including:  CCM service includes personalized support from designated clinical staff supervised by her physician, including individualized plan of care and coordination with other care providers 24/7 contact phone numbers for assistance for urgent and routine care needs. Standard insurance, coinsurance, copays and deductibles apply for chronic care management only during months in which we provide at least 20 minutes of these services. Most insurances cover these services at 100%, however patients may be responsible for any copay, coinsurance and/or deductible if applicable. This service may help you avoid the need for more expensive face-to-face services. Only one practitioner may furnish and bill the service in a calendar month. The patient may stop CCM services at any time (effective at the end of the month) by phone call to the office staff.  Patient agreed to services and verbal consent obtained.   Verbal consent obtained for [facility] enhanced pharmacy services (medication synchronization, adherence packaging, delivery coordination). A medication sync plan was created to allow patient to get all medications delivered once every 30 to 90 days per patient preference. Patient understands they have freedom to choose pharmacy and clinical pharmacist will coordinate care between all prescribers and [facility].  Patient verbalizes understanding of instructions provided today and agrees to view in [place].   Junius Argyle, PharmD, Para March, CPP  Clinical Pharmacist Practitioner  [facility] [phone]

## 2021-01-24 ENCOUNTER — Other Ambulatory Visit: Payer: Self-pay | Admitting: Physician Assistant

## 2021-01-24 DIAGNOSIS — J4541 Moderate persistent asthma with (acute) exacerbation: Secondary | ICD-10-CM

## 2021-01-25 DIAGNOSIS — Z7984 Long term (current) use of oral hypoglycemic drugs: Secondary | ICD-10-CM

## 2021-01-25 DIAGNOSIS — E1169 Type 2 diabetes mellitus with other specified complication: Secondary | ICD-10-CM | POA: Diagnosis not present

## 2021-01-25 DIAGNOSIS — E1165 Type 2 diabetes mellitus with hyperglycemia: Secondary | ICD-10-CM

## 2021-01-25 DIAGNOSIS — E785 Hyperlipidemia, unspecified: Secondary | ICD-10-CM

## 2021-01-25 DIAGNOSIS — J4540 Moderate persistent asthma, uncomplicated: Secondary | ICD-10-CM

## 2021-01-26 ENCOUNTER — Telehealth: Payer: Self-pay

## 2021-01-26 DIAGNOSIS — E785 Hyperlipidemia, unspecified: Secondary | ICD-10-CM | POA: Diagnosis not present

## 2021-01-26 DIAGNOSIS — E1169 Type 2 diabetes mellitus with other specified complication: Secondary | ICD-10-CM | POA: Diagnosis not present

## 2021-01-26 NOTE — Progress Notes (Signed)
    Chronic Care Management Pharmacy Assistant   Name: MARILYN NIHISER  MRN: [id number] DOB: Aug 30, 1955  Reason for Encounter: Medication Review/Patient assistance application for Incruse or Trulicity.   Hospital visits:  None in previous 6 months  Medications: Outpatient Encounter Medications as of 01/26/2021  Medication Sig Note   albuterol (VENTOLIN HFA) 108 (90 Base) MCG/ACT inhaler INHALE 1-2 PUFFS INTO THE LUNGS EVERY 4 TO 6 HOURS AS NEEDED FOR WHEEZING OR SHORTNESS OF BREATH.    aspirin 81 MG tablet Take by mouth daily. 08/02/2014: Received from: [place]:    aspirin-acetaminophen-caffeine (EXCEDRIN MIGRAINE) 250-250-65 MG tablet Take 1 tablet by mouth every 6 (six) hours as needed for headache.    blood glucose meter kit and supplies Dispense based on patient and insurance preference. Use daily as directed. 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    Boswellia Serrata (BOSWELLIA PO) Take 500 mg by mouth in the morning and at bedtime.    Dulaglutide (TRULICITY) 1.5 WN/4.6EV SOPN Inject 1.5 mg into the skin once a week.    glipiZIDE (GLUCOTROL XL) 10 MG 24 hr tablet Take 1 tablet (10 mg total) by mouth daily with breakfast.    INCRUSE ELLIPTA 62.5 MCG/INH AEPB TAKE 1 PUFF BY MOUTH EVERY DAY    montelukast (SINGULAIR) 10 MG tablet Take 1 tablet (10 mg total) by mouth daily.    Multiple Vitamin (MULTIVITAMIN WITH MINERALS) TABS tablet Take 1 tablet by mouth daily. Centrum Silver Women's 50+    naproxen sodium (ALEVE) 220 MG tablet Take 220 mg by mouth daily.    NON FORMULARY Take 1 capsule by mouth in the morning and at bedtime. AZO Bladder Control Supplement    oxybutynin (DITROPAN-XL) 10 MG 24 hr tablet TAKE 1 TABLET BY MOUTH EVERYDAY AT BEDTIME    rosuvastatin (CRESTOR) 5 MG tablet Take 1 tablet (5 mg total) by mouth daily.    traMADol (ULTRAM) 50 MG tablet Take by mouth every 6 (six) hours as needed.    [DISCONTINUED] losartan (COZAAR) 25 MG tablet TAKE 2 TABLETS BY MOUTH  EVERY DAY    [DISCONTINUED] XARELTO 10 MG TABS tablet Take 10 mg by mouth every morning.    No facility-administered encounter medications on file as of 01/26/2021.    Care Gaps: COVID-19 Vaccine HIV Screening Shingrix Vaccine Pneumococcal Vaccine (Last Completed 07/30/2012) Foot Exam (Last Completed 01/06/2018) Influenza Vaccine  Star Rating Drugs: Trulicity 1.5 mg last filled on 01/12/2021 [facility]. Rosuvastatin 5 mg last filled on 01/12/2021 [facility]. Glipizide 10 mg last filled on 03/24/2020 [facility].  Medication Fill Gaps: None  I received a task from Junius Argyle, CPP requesting that I start the application for patient assistance on the medication Incruse Ellipta inhaler and Trulicity..    Per Clinical pharmacist, patient requested that the application  be e-mailed to her. Patient is aware  once she receives the application she will need to complete her part of the application and return it back to her PCP office for Junius Argyle, CPP to fax over to the [facility] and [place] for processing.   Application emailed to Junius Argyle, CPP for review and to e-mailed to patient.  [facility] Pharmacist Assistant [phone]

## 2021-01-27 LAB — COMPREHENSIVE METABOLIC PANEL
ALT: 38 IU/L — ABNORMAL HIGH (ref 0–32)
AST: 30 IU/L (ref 0–40)
Albumin/Globulin Ratio: 2.3 — ABNORMAL HIGH (ref 1.2–2.2)
Albumin: 4.5 g/dL (ref 3.8–4.8)
Alkaline Phosphatase: 141 IU/L — ABNORMAL HIGH (ref 44–121)
BUN/Creatinine Ratio: 19 (ref 12–28)
BUN: 17 mg/dL (ref 8–27)
Bilirubin Total: 0.7 mg/dL (ref 0.0–1.2)
CO2: 21 mmol/L (ref 20–29)
Calcium: 9.2 mg/dL (ref 8.7–10.3)
Chloride: 103 mmol/L (ref 96–106)
Creatinine, Ser: 0.91 mg/dL (ref 0.57–1.00)
Globulin, Total: 2 g/dL (ref 1.5–4.5)
Glucose: 261 mg/dL — ABNORMAL HIGH (ref 70–99)
Potassium: 4.3 mmol/L (ref 3.5–5.2)
Sodium: 138 mmol/L (ref 134–144)
Total Protein: 6.5 g/dL (ref 6.0–8.5)
eGFR: 71 mL/min/{1.73_m2} (ref >59)

## 2021-01-27 LAB — LIPID PANEL
Chol/HDL Ratio: 3.9 ratio (ref 0.0–4.4)
Cholesterol, Total: 170 mg/dL (ref 100–199)
HDL: 44 mg/dL (ref >39)
LDL Chol Calc (NIH): 82 mg/dL (ref 0–99)
Triglycerides: 267 mg/dL — ABNORMAL HIGH (ref 0–149)
VLDL Cholesterol Cal: 44 mg/dL — ABNORMAL HIGH (ref 5–40)

## 2021-01-27 MED ORDER — TRULICITY 1.5 MG/0.5ML ~~LOC~~ SOAJ: 1.5000 mg | SUBCUTANEOUS | 3 refills | Status: DC

## 2021-01-27 MED ORDER — UMECLIDINIUM BROMIDE 62.5 MCG/ACT IN AEPB: 1.0000 | INHALATION_SPRAY | Freq: Every day | RESPIRATORY_TRACT | 3 refills | Status: DC

## 2021-01-27 NOTE — Addendum Note (Signed)
Addended by: Julious Payer A on: 01/27/2021 12:16 PM   Modules accepted: Orders

## 2021-01-30 ENCOUNTER — Telehealth: Payer: Self-pay

## 2021-01-30 DIAGNOSIS — R748 Abnormal levels of other serum enzymes: Secondary | ICD-10-CM

## 2021-01-30 NOTE — Telephone Encounter (Signed)
-----  Message from Virginia Crews, MD sent at 01/27/2021 12:47 PM EST ----- Normal/stable labs, except for elevated alk phos (a liver enzyme).  Recommend holding/cutting back on tylenol and alcohol and hydrating well.  Recheck in 1 month.

## 2021-01-30 NOTE — Progress Notes (Signed)
    Chronic Care Management Pharmacy Assistant   Name: Joanna Bishop  MRN: [id number] DOB: 1955-05-20  Onboarding with [facility]   I received a task requesting that I complete the onboarding form for the patient to start receiving her medications from [place], and contact [facility].  [facility] and requested to [facility]. The pharmacist advised that he would fax it over to [facility].   Onboarding form completed and CPP notified.   Medications: Outpatient Encounter Medications as of 01/30/2021  Medication Sig Note   albuterol (VENTOLIN HFA) 108 (90 Base) MCG/ACT inhaler INHALE 1-2 PUFFS INTO THE LUNGS EVERY 4 TO 6 HOURS AS NEEDED FOR WHEEZING OR SHORTNESS OF BREATH.    aspirin 81 MG tablet Take by mouth daily. 08/02/2014: Received from: [place]:    aspirin-acetaminophen-caffeine (EXCEDRIN MIGRAINE) 250-250-65 MG tablet Take 1 tablet by mouth every 6 (six) hours as needed for headache.    blood glucose meter kit and supplies Dispense based on patient and insurance preference. Use daily as directed. 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    Boswellia Serrata (BOSWELLIA PO) Take 500 mg by mouth in the morning and at bedtime.    Dulaglutide (TRULICITY) 1.5 TK/2.4OX SOPN Inject 1.5 mg into the skin once a week.    glipiZIDE (GLUCOTROL XL) 10 MG 24 hr tablet Take 1 tablet (10 mg total) by mouth daily with breakfast.    montelukast (SINGULAIR) 10 MG tablet Take 1 tablet (10 mg total) by mouth daily.    Multiple Vitamin (MULTIVITAMIN WITH MINERALS) TABS tablet Take 1 tablet by mouth daily. Centrum Silver Women's 50+    naproxen sodium (ALEVE) 220 MG tablet Take 220 mg by mouth daily.    NON FORMULARY Take 1 capsule by mouth in the morning and at bedtime. AZO Bladder Control Supplement    oxybutynin (DITROPAN-XL) 10 MG 24 hr tablet TAKE 1 TABLET BY MOUTH EVERYDAY AT BEDTIME    rosuvastatin (CRESTOR) 5 MG  tablet Take 1 tablet (5 mg total) by mouth daily.    traMADol (ULTRAM) 50 MG tablet Take by mouth every 6 (six) hours as needed.    umeclidinium bromide (INCRUSE ELLIPTA) 62.5 MCG/ACT AEPB Inhale 1 puff into the lungs daily.    [DISCONTINUED] losartan (COZAAR) 25 MG tablet TAKE 2 TABLETS BY MOUTH EVERY DAY    [DISCONTINUED] XARELTO 10 MG TABS tablet Take 10 mg by mouth every morning.    No facility-administered encounter medications on file as of 01/30/2021.   Lynann Bologna, CPA/CMA Clinical Pharmacist Assistant Phone: [phone]

## 2021-01-30 NOTE — Telephone Encounter (Signed)
Patient viewed results via MyChart. Lab ordered and mailed to patient to repeat around 1 month.

## 2021-02-06 ENCOUNTER — Telehealth: Payer: Self-pay

## 2021-02-06 DIAGNOSIS — E1165 Type 2 diabetes mellitus with hyperglycemia: Secondary | ICD-10-CM

## 2021-02-06 NOTE — Progress Notes (Addendum)
Chronic Care Management Pharmacy Assistant   Name: Joanna Bishop  MRN: [id number] DOB: 07/25/1955  Returning Phone Call regarding [facility]  Patient called on Friday afternoon after hours regarding her glucometer as [facility]. We had the patient's profile transferred to [facility] last week, but she was suppose to pick up her glucometer prior to the transfer, but that did not happen.  I contacted the patient back to speak with her about the profile transfer to [facility], and her glucometer. I informed her in the voicemail that I am working with [facility] to get that Glucometer delivered to her this week. I provided the patient via VM with my direct number [phone] so she can call me back directly.   I called patient back, but I had to leave another voicemail informing patient that we did not [facility] the [facility] week. I did also inform her we requested it again, and they are suppose to fax it over today so we can work on getting that glucometer delivered to her this week tomorrow no later than Wednesday. Informed patient she can give me a call back directly [phone].  02/07/2021  Lauren with [facility] was able to receive the [facility], and we have her glucometer information. She is working on getting the glucometer prescription filled. The provider did not send over a script for additional lancets and test strips so I sent a message to the CPP requesting for him to send those to [facility] so we can also send that out with her Glucometer order.   CPP was able to send the script for lancets and test strips. I called the patient, but there was still no answer. I left a voice mail requesting the patient to return my call.   [phone] left voice mail requesting the patient to return my call in order to update her on her Glucometer.  02/08/2021 Addendum   Spoke to the patient today, and updated  her on her glucometer. She advised that [facility] had already contacted her and they are going to delivery everything to her today. I informed her that I did have CPP send over a script for additional test strips and lancets that should be included with this order. I encouraged patient to give me a call back today if they are not included in the order. Patient verbalized understanding to all. Patient also advised that her delivery of medications from [place] would start next month, and I would be giving her a call about 10 days prior to delivery to go over everything with her. Patient stated that she received her Trulicity, and one more of her medications from [facility]. I informed her that could be due to it being cheaper to get it from there.    Medications: Outpatient Encounter Medications as of 02/06/2021  Medication Sig Note   albuterol (VENTOLIN HFA) 108 (90 Base) MCG/ACT inhaler INHALE 1-2 PUFFS INTO THE LUNGS EVERY 4 TO 6 HOURS AS NEEDED FOR WHEEZING OR SHORTNESS OF BREATH.    aspirin 81 MG tablet Take by mouth daily. 08/02/2014: Received from: [place]:    aspirin-acetaminophen-caffeine (EXCEDRIN MIGRAINE) 250-250-65 MG tablet Take 1 tablet by mouth every 6 (six) hours as needed for headache.    blood glucose meter kit and supplies Dispense based on patient and insurance preference. Use daily as directed. 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    Boswellia Serrata (BOSWELLIA PO) Take 500 mg by  mouth in the morning and at bedtime.    Dulaglutide (TRULICITY) 1.5 SL/3.7DS SOPN Inject 1.5 mg into the skin once a week.    glipiZIDE (GLUCOTROL XL) 10 MG 24 hr tablet Take 1 tablet (10 mg total) by mouth daily with breakfast.    montelukast (SINGULAIR) 10 MG tablet Take 1 tablet (10 mg total) by mouth daily.    Multiple Vitamin (MULTIVITAMIN WITH MINERALS) TABS tablet Take 1 tablet by mouth daily. Centrum Silver Women's 50+    naproxen sodium (ALEVE) 220 MG tablet Take 220 mg by mouth daily.     NON FORMULARY Take 1 capsule by mouth in the morning and at bedtime. AZO Bladder Control Supplement    oxybutynin (DITROPAN-XL) 10 MG 24 hr tablet TAKE 1 TABLET BY MOUTH EVERYDAY AT BEDTIME    rosuvastatin (CRESTOR) 5 MG tablet Take 1 tablet (5 mg total) by mouth daily.    traMADol (ULTRAM) 50 MG tablet Take by mouth every 6 (six) hours as needed.    umeclidinium bromide (INCRUSE ELLIPTA) 62.5 MCG/ACT AEPB Inhale 1 puff into the lungs daily.    [DISCONTINUED] losartan (COZAAR) 25 MG tablet TAKE 2 TABLETS BY MOUTH EVERY DAY    [DISCONTINUED] XARELTO 10 MG TABS tablet Take 10 mg by mouth every morning.    No facility-administered encounter medications on file as of 02/06/2021.   Lynann Bologna, CPA/CMA Clinical Pharmacist Assistant Phone: [phone]

## 2021-02-07 MED ORDER — ONETOUCH VERIO VI STRP: ORAL_STRIP | 12 refills | Status: AC

## 2021-02-07 MED ORDER — ONETOUCH DELICA LANCETS 33G MISC: 12 refills | Status: AC

## 2021-02-16 DIAGNOSIS — R928 Other abnormal and inconclusive findings on diagnostic imaging of breast: Secondary | ICD-10-CM | POA: Diagnosis not present

## 2021-02-17 ENCOUNTER — Ambulatory Visit: Payer: Self-pay

## 2021-02-17 NOTE — Telephone Encounter (Signed)
Possible UTI discomfort when urinating, smells like play dough, running a 101.00 temp last night. Seeking clinical advice and Rx, appt until 12/27    Chief Complaint: Bladder symptoms Symptoms: Burning, pain, fever Frequency: Started 2 days ago Pertinent Negatives: Patient denies blood in urine Disposition: [] ED /[x] Urgent Care (no appt availability in office) / [] Appointment(In office/virtual)/ []  [place] [facility]/ [] Home Care/ [] Refused Recommended Disposition  Additional Notes:    Reason for Disposition  Fever > 100.4 F (38.0 C)  Answer Assessment - Initial Assessment Questions 1. SYMPTOM: "What's the main symptom you're concerned about?" (e.g., frequency, incontinence)     Odor to urine, fever, burning, discomfort 2. ONSET: "When did the  symptoms  start?"     2 days 3. PAIN: "Is there any pain?" If Yes, ask: "How bad is it?" (Scale: 1-10; mild, moderate, severe)     8 4. CAUSE: "What do you think is causing the symptoms?"     UTI 5. OTHER SYMPTOMS: "Do you have any other symptoms?" (e.g., fever, flank pain, blood in urine, pain with urination)     No 6. PREGNANCY: "Is there any chance you are pregnant?" "When was your last menstrual period?"     No  Protocols used: Urinary Symptoms-A-AH

## 2021-02-21 ENCOUNTER — Ambulatory Visit (INDEPENDENT_AMBULATORY_CARE_PROVIDER_SITE_OTHER)

## 2021-02-21 DIAGNOSIS — E785 Hyperlipidemia, unspecified: Secondary | ICD-10-CM

## 2021-02-21 DIAGNOSIS — E1165 Type 2 diabetes mellitus with hyperglycemia: Secondary | ICD-10-CM

## 2021-02-21 NOTE — Patient Instructions (Signed)
Visit Information It was great speaking with you today!  Please let me know if you have any questions about our visit.   Goals Addressed             This Visit's Progress    Monitor and Manage My Blood Sugar-Diabetes Type 2   On track    Timeframe:  Long-Range Goal Priority:  High Start Date: 01/23/2021                            Expected End Date: 01/23/2022                      Follow Up within 30 days   - check blood sugar before breakfast - check blood sugar if I feel it is too high or too low - enter blood sugar readings and medication or insulin into daily log - take the blood sugar log to all doctor visits    Why is this important?   Checking your blood sugar at home helps to keep it from getting very high or very low.  Writing the results in a diary or log helps the doctor know how to care for you.  Your blood sugar log should have the time, date and the results.  Also, write down the amount of insulin or other medicine that you take.  Other information, like what you ate, exercise done and how you were feeling, will also be helpful.     Notes:         Patient Care Plan: General Pharmacy (Adult)     Problem Identified: Hyperlipidemia, Diabetes, Asthma, Overactive Bladder, and Bipolar Disorder, Chronic Pain, Chronic Migraines   Priority: High     Long-Range Goal: Patient-Specific Goal   Start Date: 01/23/2021  Expected End Date: 01/23/2022  This Visit's Progress: On track  Recent Progress: On track  Priority: High  Note:   Current Barriers:  Unable to independently afford treatment regimen Unable to achieve control of diabetes   Pharmacist Clinical Goal(s):  Patient will verbalize ability to afford treatment regimen achieve control of Diabetes as evidenced by A1c less than 8% through collaboration with PharmD and provider.   Interventions: 1:1 collaboration with Alfredia Ferguson, PA-C regarding development and update of comprehensive plan of care as  evidenced by provider attestation and co-signature Inter-disciplinary care team collaboration (see longitudinal plan of care) Comprehensive medication review performed; medication list updated in electronic medical record  Hyperlipidemia: (LDL goal < 70) -Uncontrolled -Current treatment: Rosuvastatin 5 mg daily (AM)  -Medications previously tried: NA  -Recommended to continue current medication  Diabetes (A1c goal <8%) -Uncontrolled -Current medications: Glipizide XL 10 mg daily  Trulicity 1.5 mg weekly  -Medications previously tried: Metformin  -Patient reports significant nausea + vomiting for 1-2 days after taking Trulicity. Has not improved since last visit.  -Current home glucose readings fasting glucose: Not monitoring, has been unable to utilize new glucometer. -Reports hyperglycemic symptoms: Fatigue, polydipsia, paresthesia, visual disturbances   -Current meal patterns: Frozen/fresh Fruits + vegetables. Avoids canned food. Red meat 2x weekly. Chicken. Identifies starches as her weakness.  breakfast: 2-3 Eggs  (scrambled OR boiled)  snacks: Fruit (blackberries, bananas, grapes, apples, cherries, melon) -Current exercise: Limited by leg pain following knee replacement/femur fracture. Chair exercises 3 times weekly.  -Reviewed how to use glucometer + lancing device.  -Patient wanted to continue Trulicity for one more month to see if symptoms improve. Counseled on avoiding  large meals and meals with high fat content. Will switch to ozempic if nausea does not improve.   Asthma (Goal: control symptoms and prevent exacerbations) -Not ideally controlled -Current treatment  Ventolin HFA 1-2 puffs every 4-6 hours as needed Incruse 1 puff daily  Montelukast 10 mg daily  -Medications previously tried: NA  -Exacerbations requiring treatment in last 6 months: None -Patient denies consistent use of maintenance inhaler. Takes 1-2 times monthly costly -Frequency of rescue inhaler use:  1-2 weekly -Counseled on Benefits of consistent maintenance inhaler use -Recommended to continue current medication  Bipolar Disorder (Goal: Achieve symptom remission) -Uncontrolled -Psychiatrist appointment scheduled for next week -Current treatment: None  -Medications previously tried/failed: Divalproex, Benztropine -Counseled on importance of keeping upcoming psychiatry appointment   Overactive Bladder (Goal: Minimize incontinence symptoms) -Uncontrolled -Current treatment  Oxybutynin XL 10 mg nightly  AZO Bladder control 1 capsule twice daily  -Medications previously tried: NA  -Could consider Mybretriq if it would be affordable for patient.  -Recommended to continue current medication  Migraines (Goal: Prevent migraines) -Uncontrolled -Current treatment  Excedrin Migraine Relief (Amazon brand) -Medications previously tried: NA  -Migraines occurring 1-2 times weekly.  -Counseled on risk of frequent utilization of aspirin + naproxen.  -Recommended to continue current medication  Chronic Pain (Goal: Minimize symptoms) -Controlled -Managed by Altamese Cabal, PA-C (Ortho) -Current treatment  Boswellia extract 500 mg twice daily  Tramadol 50 mg q6h PRN  -Medications previously tried: NA -Patient also reports CBD gummy use.  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  -Requires rare use of tramadol   -Recommended to continue current medication  Patient Goals/Self-Care Activities Patient will:  - check glucose daily before breakfast, document, and provide at future appointments Complete patient assistance applications for Trulicity, Incruse  Follow Up Plan: Telephone follow up appointment with care management team member scheduled for:  03/20/2020 at 11:00 AM    Patient agreed to services and verbal consent obtained.   Patient verbalizes understanding of instructions provided today and agrees to view in MyChart.   Angelena Sole,  PharmD, P[place]y [place], CPP  Clinical Pharmacist Practitioner  [facility] [phone]

## 2021-02-21 NOTE — Progress Notes (Signed)
Chronic Care Management Pharmacy Note  02/21/2021 Name:  Joanna Bishop MRN:  [id number] DOB:  03-05-1955  Summary: Patient presents for CCM follow-up.  -Reviewed how to use glucometer + lancing device.  -Patient wanted to continue Trulicity for one more month to see if nausea/vomiting symptoms improve.  Recommendations/Changes made from today's visit: - Continue current medications  Plan: CPA follow-up in one week to assess blood sugars.  CPP follow-up 1 month  Recommended Problem List Changes:  Add: Migraine  Subjective: Joanna Bishop is an 65 y.o. year old female who is a primary patient of [name], [name], [place].  The CCM team was consulted for assistance with disease management and care coordination needs.    Engaged with patient by telephone for follow up visit in response to provider referral for pharmacy case management and/or care coordination services.   Consent to Services:  The patient was given information about Chronic Care Management services, agreed to services, and gave verbal consent prior to initiation of services.  Please see initial visit note for detailed documentation.   Patient Care Team: [name], PA-C as PCP - General (Physician Assistant) Ocie Doyne, [place] (Optometry) Germaine Pomfret, [facility] (Pharmacist)  Recent office visits: 01/12/21: Patient presented to Dr. Brita Romp for follow-up. A1c 13.8%. Not taking medication since January.   Recent consult visits: None noted.  Hospital visits: None in previous 6 months  Objective:  Lab Results  Component Value Date   CREATININE 0.91 01/26/2021   BUN 17 01/26/2021   GFRNONAA >60 06/25/2019   GFRAA >60 06/25/2019   NA 138 01/26/2021   K 4.3 01/26/2021   CALCIUM 9.2 01/26/2021   CO2 21 01/26/2021   GLUCOSE 261 (H) 01/26/2021    Lab Results  Component Value Date/Time   HGBA1C 13.8 (A) 01/12/2021 01:33 PM   HGBA1C 10.4 (A) 08/28/2019 06:38 PM   HGBA1C 8.8 (H) 03/12/2018 09:32 AM    HGBA1C 6.4 (H) 12/20/2017 09:24 AM    Last diabetic Eye exam:  Lab Results  Component Value Date/Time   HMDIABEYEEXA No Retinopathy 04/04/2020 12:00 AM    Last diabetic Foot exam: No results found for: HMDIABFOOTEX   Lab Results  Component Value Date   CHOL 170 01/26/2021   HDL 44 01/26/2021   LDLCALC 82 01/26/2021   TRIG 267 (H) 01/26/2021   CHOLHDL 3.9 01/26/2021    Hepatic Function Latest Ref Rng & Units 01/26/2021 03/12/2018 12/20/2017  Total Protein 6.0 - 8.5 g/dL 6.5 6.5 6.9  Albumin 3.8 - 4.8 g/dL 4.5 4.2 4.7  AST 0 - 40 IU/L 30 34 34  ALT 0 - 32 IU/L 38(H) 45(H) 39(H)  Alk Phosphatase 44 - 121 IU/L 141(H) 95 79  Total Bilirubin 0.0 - 1.2 mg/dL 0.7 0.4 0.5    Lab Results  Component Value Date/Time   TSH 2.510 10/14/2015 11:41 AM   TSH 3.030 07/12/2015 07:23 PM   TSH 1.730 09/13/2014 10:26 AM    CBC Latest Ref Rng & Units 06/25/2019 03/12/2018 12/20/2017  WBC 4.0 - 10.5 K/uL 9.5 8.1 9.5  Hemoglobin 12.0 - 15.0 g/dL 14.2 13.6 14.5  Hematocrit 36.0 - 46.0 % 41.6 40.6 42.6  Platelets 150 - 400 K/uL 195 191 247    Lab Results  Component Value Date/Time   VD25OH 29.0 (L) 10/14/2015 11:41 AM    Clinical ASCVD: No  The 10-year ASCVD risk score (Arnett DK, et al., 2019) is: 9.7%   Values used to calculate the score:  Age: 65 years     Sex: Female     Is Non-Hispanic African American: No     Diabetic: Yes     Tobacco smoker: No     Systolic Blood Pressure: 356 mmHg     Is BP treated: No     HDL Cholesterol: 44 mg/dL     Total Cholesterol: 170 mg/dL    Depression screen [facility] - Eat 2/9 01/12/2021 03/02/2020 07/07/2018  Decreased Interest 2 0 1  Down, Depressed, Hopeless 2 0 1  PHQ - 2 Score 4 0 2  Altered sleeping 3 - 2  Tired, decreased energy 3 - 2  Change in appetite 2 - 1  Feeling bad or failure about yourself  2 - 2  Trouble concentrating 3 - 1  Moving slowly or fidgety/restless 2 - 0  Suicidal thoughts 1 - 0  PHQ-9 Score 20 - 10  Difficult doing  work/chores Very difficult - Very difficult    Social History   Tobacco Use  Smoking Status Never  Smokeless Tobacco Never   BP Readings from Last 3 Encounters:  01/12/21 136/88  08/28/19 (!) 143/85  06/25/19 (!) 178/100   Pulse Readings from Last 3 Encounters:  01/12/21 (!) 109  08/28/19 (!) 108  06/25/19 (!) 108   Wt Readings from Last 3 Encounters:  01/12/21 218 lb (98.9 kg)  08/28/19 252 lb 9.6 oz (114.6 kg)  06/25/19 240 lb (108.9 kg)   BMI Readings from Last 3 Encounters:  01/12/21 37.42 kg/m  08/28/19 42.69 kg/m  06/25/19 40.56 kg/m    Assessment/Interventions: Review of patient past medical history, allergies, medications, health status, including review of consultants reports, laboratory and other test data, was performed as part of comprehensive evaluation and provision of chronic care management services.   SDOH:  (Social Determinants of Health) assessments and interventions performed: Yes SDOH Interventions    Flowsheet Row Most Recent Value  SDOH Interventions   Financial Strain Interventions Intervention Not Indicated       SDOH Screenings   Alcohol Screen: Low Risk    Last Alcohol Screening Score (AUDIT): 1  Depression (PHQ2-9): Medium Risk   PHQ-2 Score: 20  Financial Resource Strain: Low Risk    Difficulty of Paying Living Expenses: Not hard at all  Food Insecurity: No Food Insecurity   Worried About Charity fundraiser in the Last Year: Never true   Ran Out of Food in the Last Year: Never true  Housing: Low Risk    Last Housing Risk Score: 0  Physical Activity: Insufficiently Active   Days of Exercise per Week: 4 days   Minutes of Exercise per Session: 10 min  Social Connections: Socially Isolated   Frequency of Communication with Friends and Family: More than three times a week   Frequency of Social Gatherings with Friends and Family: Three times a week   Attends Religious Services: Never   Active Member of Clubs or Organizations: No    Attends Archivist Meetings: Never   Marital Status: Widowed  Stress: Stress Concern Present   Feeling of Stress : To some extent  Tobacco Use: Low Risk    Smoking Tobacco Use: Never   Smokeless Tobacco Use: Never   Passive Exposure: Not on file  Transportation Needs: No Transportation Needs   Lack of Transportation (Medical): No   Lack of Transportation (Non-Medical): No    CCM Care Plan  Allergies  Allergen Reactions   Penicillins     Medications Reviewed Today  Reviewed by Germaine Pomfret, [place] (Pharmacist) on 01/23/21 at 1138  Med List Status: <None>   Medication Order Taking? Sig Documenting Provider Last Dose Status Informant  albuterol (VENTOLIN HFA) 108 (90 Base) MCG/ACT inhaler [id number] Yes INHALE 1-2 PUFFS INTO THE LUNGS EVERY 4 TO 6 HOURS AS NEEDED FOR WHEEZING OR SHORTNESS OF BREATH. Mar Daring, [place] Taking Active   aspirin 81 MG tablet [id number] Yes Take by mouth daily. [provider] Taking Active            Med Note Gillie Manners Aug 02, 2014  4:58 PM) Received from: [place]:   aspirin-acetaminophen-caffeine (EXCEDRIN MIGRAINE) [id number] MG tablet [id number] Yes Take 1 tablet by mouth every 6 (six) hours as needed for headache. [provider] Taking Active   blood glucose meter kit and supplies [id number] Yes Dispense based on patient and insurance preference. Use daily as directed. 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 Virginia Crews, MD  Active   Azucena Freed Serrata (BOSWELLIA PO) [id number] Yes Take 500 mg by mouth in the morning and at bedtime. [provider] Taking Active   Dulaglutide (TRULICITY) 1.5 CB/7.6EG SOPN [id number] Yes Inject 1.5 mg into the skin once a week. Virginia Crews, MD Taking Active   glipiZIDE (GLUCOTROL XL) 10 MG 24 hr tablet [id number] Yes Take 1 tablet (10 mg total) by mouth daily with breakfast. Virginia Crews, MD Taking Active   INCRUSE  ELLIPTA 62.5 MCG/INH AEPB [id number] Yes TAKE 1 PUFF BY MOUTH EVERY DAY Mar Daring, [place] Taking Active     Discontinued 06/25/19 1840 (No longer needed (for PRN medications))   montelukast (SINGULAIR) 10 MG tablet [id number] Yes Take 1 tablet (10 mg total) by mouth daily. Virginia Crews, MD Taking Active   Multiple Vitamin (MULTIVITAMIN WITH MINERALS) TABS tablet [id number] Yes Take 1 tablet by mouth daily. Centrum Silver Women's 50+ [provider] Taking Active   naproxen sodium (ALEVE) 220 MG tablet [id number] Yes Take 220 mg by mouth daily. [provider] Taking Active   NON FORMULARY [id number] Yes Take 1 capsule by mouth in the morning and at bedtime. AZO Bladder Control Supplement [provider] Taking Active   oxybutynin (DITROPAN-XL) 10 MG 24 hr tablet [id number] Yes TAKE 1 TABLET BY MOUTH EVERYDAY AT BEDTIME Bacigalupo, Dionne Bucy, MD Taking Active   rosuvastatin (CRESTOR) 5 MG tablet [id number] Yes Take 1 tablet (5 mg total) by mouth daily. Virginia Crews, MD Taking Active   traMADol Veatrice Bourbon) 50 MG tablet [id number] Yes Take by mouth every 6 (six) hours as needed. [provider] Taking Active     Discontinued 06/25/19 1840             Patient Active Problem List   Diagnosis Date Noted   OAB (overactive bladder) 01/12/2021   History of total knee arthroplasty 05/09/2020   Morbid obesity ([place]) 08/28/2019   Stiffness of right knee 06/18/2018   Type 2 diabetes mellitus with hyperglycemia, without long-term current use of insulin ([place]) 10/17/2015   Carpal tunnel syndrome of left wrist 10/14/2015   Bipolar depression ([place]) 07/12/2015   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   AB (asthmatic bronchitis) 08/02/2014   Hyperlipidemia associated with type 2 diabetes mellitus ([place]) 08/02/2014   History of suicidal ideation 08/02/2014   Avitaminosis D 08/02/2014   Generalized hyperhidrosis 08/02/2014   Airway hyperreactivity  08/02/2014   Anxiety disorder 08/02/2014   Insomnia 08/02/2014   Major depressive disorder,  single episode 08/02/2014   Shortness of breath 08/05/2009    Immunization History  Administered Date(s) Administered   Influenza,inj,Quad PF,6+ Mos 01/06/2018, 12/12/2018   Pneumococcal Polysaccharide-23 07/30/2012   Tdap 07/28/2009    Conditions to be addressed/monitored:  Hyperlipidemia, Diabetes, Asthma, Overactive Bladder, and Bipolar Disorder, Chronic Pain, Chronic Migraines  Care Plan : General Pharmacy (Adult)  Updates made by Germaine Pomfret, RPH since 02/21/2021 12:00 AM     Problem: Hyperlipidemia, Diabetes, Asthma, Overactive Bladder, and Bipolar Disorder, Chronic Pain, Chronic Migraines   Priority: High     Long-Range Goal: Patient-Specific Goal   Start Date: 01/23/2021  Expected End Date: 01/23/2022  This Visit's Progress: On track  Recent Progress: On track  Priority: High  Note:   Current Barriers:  Unable to independently afford treatment regimen Unable to achieve control of diabetes   Pharmacist Clinical Goal(s):  Patient will verbalize ability to afford treatment regimen achieve control of Diabetes as evidenced by A1c less than 8% through collaboration with PharmD and provider.   Interventions: 1:1 collaboration with [name], PA-C regarding development and update of comprehensive plan of care as evidenced by provider attestation and co-signature Inter-disciplinary care team collaboration (see longitudinal plan of care) Comprehensive medication review performed; medication list updated in electronic medical record  Hyperlipidemia: (LDL goal < 70) -Uncontrolled -Current treatment: Rosuvastatin 5 mg daily (AM)  -Medications previously tried: NA  -Recommended to continue current medication  Diabetes (A1c goal <8%) -Uncontrolled -Current medications: Glipizide XL 10 mg daily  Trulicity 1.5 mg weekly  -Medications previously tried: Metformin   -Patient reports significant nausea + vomiting for 1-2 days after taking Trulicity. Has not improved since last visit.  -Current home glucose readings fasting glucose: Not monitoring, has been unable to utilize new glucometer. -Reports hyperglycemic symptoms: Fatigue, polydipsia, paresthesia, visual disturbances   -Current meal patterns: Frozen/fresh Fruits + vegetables. Avoids canned food. Red meat 2x weekly. Chicken. Identifies starches as her weakness.  breakfast: 2-3 Eggs  (scrambled OR boiled)  snacks: Fruit (blackberries, bananas, grapes, apples, cherries, melon) -Current exercise: Limited by leg pain following knee replacement/femur fracture. Chair exercises 3 times weekly.  -Reviewed how to use glucometer + lancing device.  -Patient wanted to continue Trulicity for one more month to see if symptoms improve. Counseled on avoiding large meals and meals with high fat content. Will switch to ozempic if nausea does not improve.   Asthma (Goal: control symptoms and prevent exacerbations) -Not ideally controlled -Current treatment  Ventolin HFA 1-2 puffs every 4-6 hours as needed Incruse 1 puff daily  Montelukast 10 mg daily  -Medications previously tried: NA  -Exacerbations requiring treatment in last 6 months: None -Patient denies consistent use of maintenance inhaler. Takes 1-2 times monthly costly -Frequency of rescue inhaler use: 1-2 weekly -Counseled on Benefits of consistent maintenance inhaler use -Recommended to continue current medication  Bipolar Disorder (Goal: Achieve symptom remission) -Uncontrolled -Psychiatrist appointment scheduled for next week -Current treatment: None  -Medications previously tried/failed: Divalproex, Benztropine -Counseled on importance of keeping upcoming psychiatry appointment   Overactive Bladder (Goal: Minimize incontinence symptoms) -Uncontrolled -Current treatment  Oxybutynin XL 10 mg nightly  AZO Bladder control 1 capsule twice daily   -Medications previously tried: NA  -Could consider Mybretriq if it would be affordable for patient.  -Recommended to continue current medication  Migraines (Goal: Prevent migraines) -Uncontrolled -Current treatment  Excedrin Migraine Relief ([employer] brand) -Medications previously tried: NA  -Migraines occurring 1-2 times weekly.  -Counseled on risk of frequent utilization  of aspirin + naproxen.  -Recommended to continue current medication  Chronic Pain (Goal: Minimize symptoms) -Controlled -Managed by Carlynn Spry, PA-C (Ortho) -Current treatment  Boswellia extract 500 mg twice daily  Tramadol 50 mg q6h PRN  -Medications previously tried: NA -Patient also reports CBD gummy use.  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  -Requires rare use of tramadol   -Recommended to continue current medication  Patient Goals/Self-Care Activities Patient will:  - check glucose daily before breakfast, document, and provide at future appointments Complete patient assistance applications for Trulicity, Incruse  Follow Up Plan: Telephone follow up appointment with care management team member scheduled for:  03/20/2020 at 11:00 AM       Medication Assistance: Application for Trulicity, Incruse  medication assistance program. in process.  Anticipated assistance start date TBD.  See plan of care for additional detail.  Compliance/Adherence/Medication fill history: Care Gaps: Covid Vaccine  HIV Screening  Shingrix  Pneumonia  Foot Exam  Influenza   Star-Rating Drugs: Rosuvastatin 5 mg: LF 02/05/2020 for 90-DS   Patient's preferred pharmacy is:  [facility] - [place], [place] - [address] [place] [address] Phone: [phone] Fax: [phone]  [facility] [place], [place] - [address] [place] [address] Phone: [phone] Fax:  [phone]   Uses pill box? Yes Pt endorses 100% compliance  Patient decided to: Utilize [facility] for medication synchronization, packaging and delivery  Care Plan and Follow Up Patient Decision:  Patient agrees to Care Plan and Follow-up.  Plan: Telephone follow up appointment with care management team member scheduled for:  03/20/2020 at 11:00 AM  Junius Argyle, PharmD, Para [place], [place] [phone]

## 2021-02-25 DIAGNOSIS — E1165 Type 2 diabetes mellitus with hyperglycemia: Secondary | ICD-10-CM

## 2021-02-25 DIAGNOSIS — E1169 Type 2 diabetes mellitus with other specified complication: Secondary | ICD-10-CM

## 2021-02-25 DIAGNOSIS — E785 Hyperlipidemia, unspecified: Secondary | ICD-10-CM

## 2021-03-08 ENCOUNTER — Ambulatory Visit (INDEPENDENT_AMBULATORY_CARE_PROVIDER_SITE_OTHER)

## 2021-03-08 DIAGNOSIS — Z0000 Encounter for general adult medical examination without abnormal findings: Secondary | ICD-10-CM | POA: Diagnosis not present

## 2021-03-08 DIAGNOSIS — Z780 Asymptomatic menopausal state: Secondary | ICD-10-CM | POA: Diagnosis not present

## 2021-03-08 NOTE — Progress Notes (Signed)
Virtual Visit via Telephone Note  I connected with  Joanna Bishop on 03/08/21 at  9:00 AM EST by telephone and verified that I am speaking with the correct person using two identifiers.  Location: Patient: home Provider: [facility] Persons participating in the virtual visit: [place]   I discussed the limitations, risks, security and privacy concerns of performing an evaluation and management service by telephone and the availability of in person appointments. The patient expressed understanding and agreed to proceed.  Interactive audio and video telecommunications were attempted between this nurse and patient, however failed, due to patient having technical difficulties OR patient did not have access to video capability.  We continued and completed visit with audio only.  Some vital signs may be absent or patient reported.   Dionisio David, LPN  Subjective:   Joanna Bishop is a 66 y.o. female who presents for Medicare Annual (Subsequent) preventive examination.  Review of Systems           Objective:    There were no vitals filed for this visit. There is no height or weight on file to calculate BMI.  Advanced Directives 03/02/2020 10/18/2015 07/12/2015 12/09/2014 08/03/2014  Does Patient Have a Medical Advance Directive? Yes No No Yes Yes  Type of Paramedic of [place];Living will - - [place];Living will [place];Living will  Copy of [place] in Chart? No - copy requested - - - -  Would patient like information on creating a medical advance directive? - No - patient declined information - - -  Some encounter information is confidential and restricted. Go to Review Flowsheets activity to see all data.    Current Medications (verified) Outpatient Encounter Medications as of 03/08/2021  Medication Sig   albuterol (VENTOLIN HFA) 108 (90 Base) MCG/ACT inhaler INHALE 1-2 PUFFS INTO THE  LUNGS EVERY 4 TO 6 HOURS AS NEEDED FOR WHEEZING OR SHORTNESS OF BREATH.   aspirin 81 MG tablet Take by mouth daily.   aspirin-acetaminophen-caffeine (EXCEDRIN MIGRAINE) 250-250-65 MG tablet Take 1 tablet by mouth every 6 (six) hours as needed for headache.   blood glucose meter kit and supplies Dispense based on patient and insurance preference. Use daily as directed. 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   Boswellia Serrata (BOSWELLIA PO) Take 500 mg by mouth in the morning and at bedtime.   Dulaglutide (TRULICITY) 1.5 RX/4.5OP SOPN Inject 1.5 mg into the skin once a week.   glipiZIDE (GLUCOTROL XL) 10 MG 24 hr tablet Take 1 tablet (10 mg total) by mouth daily with breakfast.   glucose blood (ONETOUCH VERIO) test strip Use to check blood sugars once daily as instructed   montelukast (SINGULAIR) 10 MG tablet Take 1 tablet (10 mg total) by mouth daily.   Multiple Vitamin (MULTIVITAMIN WITH MINERALS) TABS tablet Take 1 tablet by mouth daily. Centrum Silver Women's 50+   naproxen sodium (ALEVE) 220 MG tablet Take 220 mg by mouth daily.   NON FORMULARY Take 1 capsule by mouth in the morning and at bedtime. AZO Bladder Control Supplement   OneTouch Delica Lancets 92T MISC Use to check blood sugars once daily as directed.   oxybutynin (DITROPAN-XL) 10 MG 24 hr tablet TAKE 1 TABLET BY MOUTH EVERYDAY AT BEDTIME   rosuvastatin (CRESTOR) 5 MG tablet Take 1 tablet (5 mg total) by mouth daily.   traMADol (ULTRAM) 50 MG tablet Take by mouth every 6 (six) hours as needed.   umeclidinium bromide (  INCRUSE ELLIPTA) 62.5 MCG/ACT AEPB Inhale 1 puff into the lungs daily.   [DISCONTINUED] losartan (COZAAR) 25 MG tablet TAKE 2 TABLETS BY MOUTH EVERY DAY   [DISCONTINUED] XARELTO 10 MG TABS tablet Take 10 mg by mouth every morning.   No facility-administered encounter medications on file as of 03/08/2021.    Allergies (verified) Penicillins   History: Past Medical History:  Diagnosis Date   Anxiety    Arthritis    shoulders,  legs   Asthma    Depression    Diabetes mellitus without complication (HCC)    Hyperlipidemia    Migraine headache    2x/month   Vertigo    Past Surgical History:  Procedure Laterality Date   ABDOMINAL HYSTERECTOMY     ARTHROPLASTY     CHOLECYSTECTOMY     FEMUR FRACTURE SURGERY     TOTAL KNEE ARTHROPLASTY Left    Family History  Problem Relation Age of Onset   Diabetes Mother    COPD Mother    Diabetes Father    Coronary artery disease Father    Coronary artery disease Maternal Grandmother    Heart attack Maternal Grandmother    Diabetes Paternal Grandmother    Social History   Socioeconomic History   Marital status: Widowed    Spouse name: Not on file   Number of children: 3   Years of education: Not on file   Highest education level: Some college, no degree  Occupational History   Occupation: disability  Tobacco Use   Smoking status: Never   Smokeless tobacco: Never  Vaping Use   Vaping Use: Never used  Substance and Sexual Activity   Alcohol use: Yes    Alcohol/week: 0.0 standard drinks    Comment: 1 drink every 3 months   Drug use: Never   Sexual activity: Not Currently  Other Topics Concern   Not on file  Social History Narrative   Not on file   Social Determinants of Health   Financial Resource Strain: Low Risk    Difficulty of Paying Living Expenses: Not hard at all  Food Insecurity: Not on file  Transportation Needs: Not on file  Physical Activity: Not on file  Stress: Not on file  Social Connections: Not on file    Tobacco Counseling Counseling given: Not Answered   Clinical Intake:  Pre-visit preparation completed: Yes  Pain : No/denies pain     Nutritional Risks: None Diabetes: No  How often do you need to have someone help you when you read instructions, pamphlets, or other written materials from your doctor or pharmacy?: 1 - Never  Diabetic?yes Nutrition Risk Assessment:  Has the patient had any N/V/D within the last 2  months?  No  Does the patient have any non-healing wounds?  No  Has the patient had any unintentional weight loss or weight gain?  Yes   Diabetes:  Is the patient diabetic?  Yes  If diabetic, was a CBG obtained today?  No  Did the patient bring in their glucometer from home?  No  How often do you monitor your CBG's? Every other day.   Financial Strains and Diabetes Management:  Are you having any financial strains with the device, your supplies or your medication? No .  Does the patient want to be seen by Chronic Care Management for management of their diabetes?  No  Would the patient like to be referred to a Nutritionist or for Diabetic Management?  No   Diabetic Exams:  Diabetic  Eye Exam: Completed 04/04/20 (negative retinopathy).   Diabetic Foot Exam: Completed 01/06/18 Pt has been advised about the importance in completing this exam.   Interpreter Needed?: No  Information entered by :: [name], LPN   Activities of Daily Living In your present state of health, do you have any difficulty performing the following activities: 01/12/2021  Hearing? Y  Vision? Y  Difficulty concentrating or making decisions? Y  Walking or climbing stairs? Y  Dressing or bathing? N  Doing errands, shopping? Y  Some recent data might be hidden    Patient Care Team: [name], PA-C as PCP - General (Physician Assistant) Ocie Doyne, [place] (Optometry) Germaine Pomfret, [facility] (Pharmacist)  Indicate any recent Medical Services you may have received from other than [facility] providers in the past year (date may be approximate).     Assessment:   This is a routine wellness examination for Travis.  Hearing/Vision screen No results found.  Dietary issues and exercise activities discussed:     Goals Addressed   None    Depression Screen PHQ 2/9 Scores 01/12/2021 03/02/2020 07/07/2018 02/28/2017 12/09/2014  PHQ - 2 Score 4 0 _0 PHQ- 9 Score 20 - _1 Fall Risk Fall Risk   01/12/2021 03/02/2020 07/07/2018  Falls in the past year? 1 1 0  Number falls in past yr: 1 0 -  Injury with Fall? 0 0 0  Risk for fall due to : History of fall(s) - -  Follow up Falls evaluation completed Falls prevention discussed -    FALL RISK PREVENTION PERTAINING TO THE HOME:  Any stairs in or around the home? No  If so, are there any without handrails? No  Home free of loose throw rugs in walkways, pet beds, electrical cords, etc? Yes  Adequate lighting in your home to reduce risk of falls? Yes   ASSISTIVE DEVICES UTILIZED TO PREVENT FALLS:  Life alert? No  Use of a cane, walker or w/c? Yes  Grab bars in the bathroom? Yes  Shower chair or bench in shower? Yes  Elevated toilet seat or a handicapped toilet? Yes   Cognitive Function:Normal cognitive status assessed by direct observation by this Nurse Health Advisor. No abnormalities found.       6CIT Screen 07/07/2018  What Year? 0 points  What month? 0 points  What time? 0 points  Count back from 20 0 points  Months in reverse 0 points  Repeat phrase 0 points  Total Score 0    Immunizations Immunization History  Administered Date(s) Administered   Influenza,inj,Quad PF,6+ Mos 01/06/2018, 12/12/2018   Pneumococcal Polysaccharide-23 07/30/2012   Tdap 07/28/2009    TDAP status: Due, Education has been provided regarding the importance of this vaccine. Advised may receive this vaccine at local pharmacy or Health Dept. Aware to provide a copy of the vaccination record if obtained from local pharmacy or Health Dept. Verbalized acceptance and understanding.  Flu Vaccine status: Declined, Education has been provided regarding the importance of this vaccine but patient still declined. Advised may receive this vaccine at local pharmacy or Health Dept. Aware to provide a copy of the vaccination record if obtained from local pharmacy or Health Dept. Verbalized acceptance and understanding.  Pneumococcal vaccine status: Up to  date  Covid-19 vaccine status: Declined, Education has been provided regarding the importance of this vaccine but patient still declined. Advised may receive this vaccine at local pharmacy or Health Dept.or vaccine clinic. Aware  to provide a copy of the vaccination record if obtained from local pharmacy or Health Dept. Verbalized acceptance and understanding.  Qualifies for Shingles Vaccine? Yes   Zostavax completed No   Shingrix Completed?: No.    Education has been provided regarding the importance of this vaccine. Patient has been advised to call insurance company to determine out of pocket expense if they have not yet received this vaccine. Advised may also receive vaccine at local pharmacy or Health Dept. Verbalized acceptance and understanding.  Screening Tests Health Maintenance  Topic Date Due   COVID-19 Vaccine (1) Never done   HIV Screening  Never done   MAMMOGRAM  Never done   Zoster Vaccines- Shingrix (1 of 2) Never done   Pneumococcal Vaccine 31-23 Years old (2 - PCV) 07/30/2013   URINE MICROALBUMIN  12/21/2018   FOOT EXAM  01/07/2019   TETANUS/TDAP  07/29/2019   INFLUENZA VACCINE  09/26/2020   OPHTHALMOLOGY EXAM  04/04/2021   PAP SMEAR-Modifier  07/06/2021   HEMOGLOBIN A1C  07/12/2021   COLONOSCOPY (Pts 45-44yr Insurance coverage will need to be confirmed)  10/18/2026   Hepatitis C Screening  Completed   HPV VACCINES  Aged Out    Health Maintenance  Health Maintenance Due  Topic Date Due   COVID-19 Vaccine (1) Never done   HIV Screening  Never done   MAMMOGRAM  Never done   Zoster Vaccines- Shingrix (1 of 2) Never done   Pneumococcal Vaccine 138624Years old (2 - PCV) 07/30/2013   URINE MICROALBUMIN  12/21/2018   FOOT EXAM  01/07/2019   TETANUS/TDAP  07/29/2019   INFLUENZA VACCINE  09/26/2020    Colorectal cancer screening: Type of screening: Colonoscopy. Completed 11/08/16. Repeat every 10 years  [place]: [place] [facility] [place]   Bone Density status: Ordered 03/08/21. Pt provided with contact info and advised to call to schedule appt.  Lung Cancer Screening: (Low Dose CT Chest recommended if Age 64-80years, 30 pack-year currently smoking OR have quit w/in 15years.) does not qualify.    Additional Screening:  Hepatitis C Screening: does qualify; Completed 8/18/7  Vision Screening: Recommended annual ophthalmology exams for early detection of glaucoma and other disorders of the eye. Is the patient up to date with their annual eye exam?  Yes  Who is the provider or what is the name of the office in which the patient attends annual eye exams[place] not established with a provider, would they like to be referred to a provider to establish care? No .   Dental Screening: Recommended annual dental exams for proper oral hygiene  Community Resource Referral / Chronic Care Management: CRR required this visit?  No   CCM required this visit?  No      Plan:     I have personally reviewed and noted the following in the patients chart:   Medical and social history Use of alcohol, tobacco or illicit drugs  Current medications and supplements including opioid prescriptions.  Functional ability and status Nutritional status Physical activity Advanced directives List of other physicians Hospitalizations, surgeries, and ER visits in previous 12 months Vitals Screenings to include cognitive, depression, and falls Referrals and appointments  In addition, I have reviewed and discussed with patient certain preventive protocols, quality metrics, and best practice recommendations. A written personalized care plan for preventive services as well as general preventive health recommendations were provided to patient.     Aldahir Litaker S  Drema Dallas, LPN   5/67/2091   Nurse Notes: none

## 2021-03-08 NOTE — Patient Instructions (Signed)
Joanna Bishop , Thank you for taking time to come for your Medicare Wellness Visit. I appreciate your ongoing commitment to your health goals. Please review the following plan we discussed and let me know if I can assist you in the future.   Screening recommendations/referrals: Colonoscopy: 11/08/16 Mammogram: has done at [facility] Bone Density: referral sent Recommended yearly ophthalmology/optometry visit for glaucoma screening and checkup Recommended yearly dental visit for hygiene and checkup  Vaccinations: Influenza vaccine: 12/12/18, due Pneumococcal vaccine: 07/30/12 Tdap vaccine: 07/28/09, due Shingles vaccine: n/d  Covid-19: n/d   Advanced directives: no  Conditions/risks identified: none  Next appointment: Follow up in one year for your annual wellness visit. 03/12/22 @ 9 am  Preventive Care 40-64 Years, Female Preventive care refers to lifestyle choices and visits with your health care provider that can promote health and wellness. What does preventive care include? A yearly physical exam. This is also called an annual well check. Dental exams once or twice a year. Routine eye exams. Ask your health care provider how often you should have your eyes checked. Personal lifestyle choices, including: Daily care of your teeth and gums. Regular physical activity. Eating a healthy diet. Avoiding tobacco and drug use. Limiting alcohol use. Practicing safe sex. Taking low-dose aspirin daily starting at age 64. Taking vitamin and mineral supplements as recommended by your health care provider. What happens during an annual well check? The services and screenings done by your health care provider during your annual well check will depend on your age, overall health, lifestyle risk factors, and family history of disease. Counseling  Your health care provider may ask you questions about your: Alcohol use. Tobacco use. Drug use. Emotional well-being. Home and relationship  well-being. Sexual activity. Eating habits. Work and work Statistician. Method of birth control. Menstrual cycle. Pregnancy history. Screening  You may have the following tests or measurements: Height, weight, and BMI. Blood pressure. Lipid and cholesterol levels. These may be checked every 5 years, or more frequently if you are over 32 years old. Skin check. Lung cancer screening. You may have this screening every year starting at age 64 if you have a 30-pack-year history of smoking and currently smoke or have quit within the past 15 years. Fecal occult blood test (FOBT) of the stool. You may have this test every year starting at age 64. Flexible sigmoidoscopy or colonoscopy. You may have a sigmoidoscopy every 5 years or a colonoscopy every 10 years starting at age 64. Hepatitis C blood test. Hepatitis B blood test. Sexually transmitted disease (STD) testing. Diabetes screening. This is done by checking your blood sugar (glucose) after you have not eaten for a while (fasting). You may have this done every 1-3 years. Mammogram. This may be done every 1-2 years. Talk to your health care provider about when you should start having regular mammograms. This may depend on whether you have a family history of breast cancer. BRCA-related cancer screening. This may be done if you have a family history of breast, ovarian, tubal, or peritoneal cancers. Pelvic exam and Pap test. This may be done every 3 years starting at age 64. Starting at age 37, this may be done every 5 years if you have a Pap test in combination with an HPV test. Bone density scan. This is done to screen for osteoporosis. You may have this scan if you are at high risk for osteoporosis. Discuss your test results, treatment options, and if necessary, the need for more tests with  your health care provider. Vaccines  Your health care provider may recommend certain vaccines, such as: Influenza vaccine. This is recommended every  year. Tetanus, diphtheria, and acellular pertussis (Tdap, Td) vaccine. You may need a Td booster every 10 years. Zoster vaccine. You may need this after age 64. Pneumococcal 13-valent conjugate (PCV13) vaccine. You may need this if you have certain conditions and were not previously vaccinated. Pneumococcal polysaccharide (PPSV23) vaccine. You may need one or two doses if you smoke cigarettes or if you have certain conditions. Talk to your health care provider about which screenings and vaccines you need and how often you need them. This information is not intended to replace advice given to you by your health care provider. Make sure you discuss any questions you have with your health care provider. Document Released: 03/11/2015 Document Revised: 11/02/2015 Document Reviewed: 12/14/2014 Elsevier Interactive Patient Education  2017 [employer] Prevention in the Home Falls can cause injuries. They can happen to people of all ages. There are many things you can do to make your home safe and to help prevent falls. What can I do on the outside of my home? Regularly fix the edges of walkways and driveways and fix any cracks. Remove anything that might make you trip as you walk through a door, such as a raised step or threshold. Trim any bushes or trees on the path to your home. Use bright outdoor lighting. Clear any walking paths of anything that might make someone trip, such as rocks or tools. Regularly check to see if handrails are loose or broken. Make sure that both sides of any steps have handrails. Any raised decks and porches should have guardrails on the edges. Have any leaves, snow, or ice cleared regularly. Use sand or salt on walking paths during winter. Clean up any spills in your garage right away. This includes oil or grease spills. What can I do in the bathroom? Use night lights. Install grab bars by the toilet and in the tub and shower. Do not use towel bars as grab  bars. Use non-skid mats or decals in the tub or shower. If you need to sit down in the shower, use a plastic, non-slip stool. Keep the floor dry. Clean up any water that spills on the floor as soon as it happens. Remove soap buildup in the tub or shower regularly. Attach bath mats securely with double-sided non-slip rug tape. Do not have throw rugs and other things on the floor that can make you trip. What can I do in the bedroom? Use night lights. Make sure that you have a light by your bed that is easy to reach. Do not use any sheets or blankets that are too big for your bed. They should not hang down onto the floor. Have a firm chair that has side arms. You can use this for support while you get dressed. Do not have throw rugs and other things on the floor that can make you trip. What can I do in the kitchen? Clean up any spills right away. Avoid walking on wet floors. Keep items that you use a lot in easy-to-reach places. If you need to reach something above you, use a strong step stool that has a grab bar. Keep electrical cords out of the way. Do not use floor polish or wax that makes floors slippery. If you must use wax, use non-skid floor wax. Do not have throw rugs and other things on the floor  that can make you trip. What can I do with my stairs? Do not leave any items on the stairs. Make sure that there are handrails on both sides of the stairs and use them. Fix handrails that are broken or loose. Make sure that handrails are as long as the stairways. Check any carpeting to make sure that it is firmly attached to the stairs. Fix any carpet that is loose or worn. Avoid having throw rugs at the top or bottom of the stairs. If you do have throw rugs, attach them to the floor with carpet tape. Make sure that you have a light switch at the top of the stairs and the bottom of the stairs. If you do not have them, ask someone to add them for you. What else can I do to help prevent  falls? Wear shoes that: Do not have high heels. Have rubber bottoms. Are comfortable and fit you well. Are closed at the toe. Do not wear sandals. If you use a stepladder: Make sure that it is fully opened. Do not climb a closed stepladder. Make sure that both sides of the stepladder are locked into place. Ask someone to hold it for you, if possible. Clearly mark and make sure that you can see: Any grab bars or handrails. First and last steps. Where the edge of each step is. Use tools that help you move around (mobility aids) if they are needed. These include: Canes. Walkers. Scooters. Crutches. Turn on the lights when you go into a dark area. Replace any light bulbs as soon as they burn out. Set up your furniture so you have a clear path. Avoid moving your furniture around. If any of your floors are uneven, fix them. If there are any pets around you, be aware of where they are. Review your medicines with your doctor. Some medicines can make you feel dizzy. This can increase your chance of falling. Ask your doctor what other things that you can do to help prevent falls. This information is not intended to replace advice given to you by your health care provider. Make sure you discuss any questions you have with your health care provider. Document Released: 12/09/2008 Document Revised: 07/21/2015 Document Reviewed: 03/19/2014 Elsevier Interactive Patient Education  2017 [employer].

## 2021-03-09 ENCOUNTER — Ambulatory Visit (INDEPENDENT_AMBULATORY_CARE_PROVIDER_SITE_OTHER): Payer: Medicare HMO

## 2021-03-09 ENCOUNTER — Ambulatory Visit
Admission: EM | Admit: 2021-03-09 | Discharge: 2021-03-09 | Disposition: A | Discharge status: Home / Self Care | Payer: Medicare HMO | Attending: Physician Assistant | Admitting: Physician Assistant

## 2021-03-09 ENCOUNTER — Other Ambulatory Visit: Payer: Self-pay

## 2021-03-09 DIAGNOSIS — M25551 Pain in right hip: Secondary | ICD-10-CM | POA: Insufficient documentation

## 2021-03-09 DIAGNOSIS — R1031 Right lower quadrant pain: Secondary | ICD-10-CM

## 2021-03-09 DIAGNOSIS — T148XXA Other injury of unspecified body region, initial encounter: Secondary | ICD-10-CM | POA: Insufficient documentation

## 2021-03-09 DIAGNOSIS — R160 Hepatomegaly, not elsewhere classified: Secondary | ICD-10-CM | POA: Diagnosis not present

## 2021-03-09 LAB — COMPREHENSIVE METABOLIC PANEL
ALT: 33 U/L (ref 0–44)
AST: 30 U/L (ref 15–41)
Albumin: 4.5 g/dL (ref 3.5–5.0)
Alkaline Phosphatase: 90 U/L (ref 38–126)
Anion gap: 8 (ref 5–15)
BUN: 12 mg/dL (ref 8–23)
CO2: 25 mmol/L (ref 22–32)
Calcium: 9.5 mg/dL (ref 8.9–10.3)
Chloride: 104 mmol/L (ref 98–111)
Creatinine, Ser: 1.05 mg/dL — ABNORMAL HIGH (ref 0.44–1.00)
GFR, Estimated: 60 mL/min — ABNORMAL LOW (ref >60)
Glucose, Bld: 156 mg/dL — ABNORMAL HIGH (ref 70–99)
Potassium: 4.3 mmol/L (ref 3.5–5.1)
Sodium: 137 mmol/L (ref 135–145)
Total Bilirubin: 1.2 mg/dL (ref 0.3–1.2)
Total Protein: 7.4 g/dL (ref 6.5–8.1)

## 2021-03-09 LAB — CBC WITH DIFFERENTIAL/PLATELET
Abs Immature Granulocytes: 0.04 10*3/uL (ref 0.00–0.07)
Basophils Absolute: 0.1 10*3/uL (ref 0.0–0.1)
Basophils Relative: 1 %
Eosinophils Absolute: 0.5 10*3/uL (ref 0.0–0.5)
Eosinophils Relative: 4 %
HCT: 39.2 % (ref 36.0–46.0)
Hemoglobin: 13.5 g/dL (ref 12.0–15.0)
Immature Granulocytes: 0 %
Lymphocytes Relative: 33 %
Lymphs Abs: 3.7 10*3/uL (ref 0.7–4.0)
MCH: 29.7 pg (ref 26.0–34.0)
MCHC: 34.4 g/dL (ref 30.0–36.0)
MCV: 86.3 fL (ref 80.0–100.0)
Monocytes Absolute: 0.8 10*3/uL (ref 0.1–1.0)
Monocytes Relative: 7 %
Neutro Abs: 6.2 10*3/uL (ref 1.7–7.7)
Neutrophils Relative %: 55 %
Platelets: 257 10*3/uL (ref 150–400)
RBC: 4.54 MIL/uL (ref 3.87–5.11)
RDW: 13.5 % (ref 11.5–15.5)
WBC: 11.4 10*3/uL — ABNORMAL HIGH (ref 4.0–10.5)
nRBC: 0 % (ref 0.0–0.2)

## 2021-03-09 MED ORDER — BACLOFEN 10 MG PO TABS: 10.0000 mg | ORAL_TABLET | Freq: Two times a day (BID) | ORAL | 0 refills | Status: DC | PRN

## 2021-03-09 NOTE — ED Provider Notes (Signed)
[facility]    CSN: [id number] Arrival date & time: 03/09/21  1753      History   Chief Complaint Chief Complaint  Patient presents with   Abdominal Pain    HPI Joanna Bishop is a 66 y.o. female presenting for right lower abdominal pain for the past 1 week.  Patient says pain is not getting any better and seems to be getting worse.  Increased pain when she twists her body, leans forward or crosses her leg.  Denies any association of pain to eating or drinking.  Denies any associated fever, fatigue, nausea/vomiting/diarrhea.  Patient says she believes she has been constipated so she has taken senna.  Reports that has caused her to have a bowel movement and no change in pain.  Patient reports that she has had bilateral knee replacement.  Reports issues with her hips.  States she has been told that she might need to have hip replacements as well.  Patient denies any injury to her abdomen or head.  Patient reports history of abdominal hysterectomy, cholecystectomy.  Reports history of fatty liver disease.  States she is lost 70 pounds in the last year since being told she has a fatty liver.  History of diabetes, hyperlipidemia.  No other complaints today.  HPI  Past Medical History:  Diagnosis Date   Anxiety    Arthritis    shoulders, legs   Asthma    Depression    Diabetes mellitus without complication ([place])    Hyperlipidemia    Migraine headache    2x/month   Vertigo     Patient Active Problem List   Diagnosis Date Noted   OAB (overactive bladder) 01/12/2021   History of total knee arthroplasty 05/09/2020   Morbid obesity ([place]) 08/28/2019   Stiffness of right knee 06/18/2018   Type 2 diabetes mellitus with hyperglycemia, without long-term current use of insulin ([place]) 10/17/2015   Carpal tunnel syndrome of left wrist 10/14/2015   Bipolar depression ([place]) 07/12/2015   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   AB (asthmatic bronchitis) 08/02/2014    Hyperlipidemia associated with type 2 diabetes mellitus ([place]) 08/02/2014   History of suicidal ideation 08/02/2014   Avitaminosis D 08/02/2014   Generalized hyperhidrosis 08/02/2014   Airway hyperreactivity 08/02/2014   Anxiety disorder 08/02/2014   Insomnia 08/02/2014   Major depressive disorder, single episode 08/02/2014   Shortness of breath 08/05/2009    Past Surgical History:  Procedure Laterality Date   ABDOMINAL HYSTERECTOMY     ARTHROPLASTY     CHOLECYSTECTOMY     FEMUR FRACTURE SURGERY     TOTAL KNEE ARTHROPLASTY Left     OB History   No obstetric history on file.      Home Medications    Prior to Admission medications   Medication Sig Start Date End Date Taking? Authorizing Provider  albuterol (VENTOLIN HFA) 108 (90 Base) MCG/ACT inhaler INHALE 1-2 PUFFS INTO THE LUNGS EVERY 4 TO 6 HOURS AS NEEDED FOR WHEEZING OR SHORTNESS OF BREATH. 02/07/21  Yes [name], PA-C  aspirin 81 MG tablet Take by mouth daily.   Yes [provider]  aspirin-acetaminophen-caffeine (EXCEDRIN MIGRAINE) [id number] MG tablet Take 1 tablet by mouth every 6 (six) hours as needed for headache.   Yes [provider]  baclofen (LIORESAL) 10 MG tablet Take 1 tablet (10 mg total) by mouth 2 (two) times daily as needed for muscle spasms. 03/09/21  Yes Laurene Footman B, PA-C  blood glucose meter kit  and supplies Dispense based on patient and insurance preference. Use daily as directed. 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 01/23/21  Yes Bacigalupo, Dionne Bucy, MD  Boswellia Serrata (BOSWELLIA PO) Take 500 mg by mouth in the morning and at bedtime.   Yes [provider]  Dulaglutide (TRULICITY) 1.5 CW/8.8QB SOPN Inject 1.5 mg into the skin once a week. 01/27/21  Yes Bacigalupo, Dionne Bucy, MD  glipiZIDE (GLUCOTROL XL) 10 MG 24 hr tablet Take 1 tablet (10 mg total) by mouth daily with breakfast. 01/12/21  Yes Bacigalupo, Dionne Bucy, MD  glucose blood (ONETOUCH VERIO) test strip Use to check blood  sugars once daily as instructed 02/07/21  Yes Bacigalupo, Dionne Bucy, MD  montelukast (SINGULAIR) 10 MG tablet Take 1 tablet (10 mg total) by mouth daily. 01/12/21  Yes Bacigalupo, Dionne Bucy, MD  Multiple Vitamin (MULTIVITAMIN WITH MINERALS) TABS tablet Take 1 tablet by mouth daily. Centrum Silver Women's 50+   Yes [provider]  naproxen sodium (ALEVE) 220 MG tablet Take 220 mg by mouth daily.   Yes [provider]  NON FORMULARY Take 1 capsule by mouth in the morning and at bedtime. AZO Bladder Control Supplement   Yes [provider]  OneTouch Delica Lancets 16X MISC Use to check blood sugars once daily as directed. 02/07/21  Yes Bacigalupo, Dionne Bucy, MD  oxybutynin (DITROPAN-XL) 10 MG 24 hr tablet TAKE 1 TABLET BY MOUTH EVERYDAY AT BEDTIME 01/12/21  Yes Bacigalupo, Dionne Bucy, MD  rosuvastatin (CRESTOR) 5 MG tablet Take 1 tablet (5 mg total) by mouth daily. 01/12/21  Yes Bacigalupo, Dionne Bucy, MD  traMADol (ULTRAM) 50 MG tablet Take by mouth every 6 (six) hours as needed.   Yes [provider]  umeclidinium bromide (INCRUSE ELLIPTA) 62.5 MCG/ACT AEPB Inhale 1 puff into the lungs daily. 01/27/21  Yes Virginia Crews, MD  losartan (COZAAR) 25 MG tablet TAKE 2 TABLETS BY MOUTH EVERY DAY 11/02/18 06/25/19  Mar Daring, PA-C  XARELTO 10 MG TABS tablet Take 10 mg by mouth every morning. 06/16/18 06/25/19  [provider]    Family History Family History  Problem Relation Age of Onset   Diabetes Mother    COPD Mother    Diabetes Father    Coronary artery disease Father    Coronary artery disease Maternal Grandmother    Heart attack Maternal Grandmother    Diabetes Paternal Grandmother     Social History Social History   Tobacco Use   Smoking status: Never   Smokeless tobacco: Never  Vaping Use   Vaping Use: Never used  Substance Use Topics   Alcohol use: Not Currently    Comment: 1 drink every 3 months   Drug use: Never      Allergies   Penicillins   Review of Systems Review of Systems  Constitutional:  Negative for appetite change, fatigue and fever.  HENT:  Negative for congestion.   Respiratory:  Negative for cough and shortness of breath.   Cardiovascular:  Negative for chest pain.  Gastrointestinal:  Positive for abdominal pain. Negative for blood in stool, constipation, diarrhea, nausea, rectal pain and vomiting.  Genitourinary:  Negative for dysuria, flank pain, frequency and urgency.  Musculoskeletal:  Positive for arthralgias (Right hip pain). Negative for back pain, gait problem and myalgias.  Neurological:  Negative for weakness.    Physical Exam Triage Vital Signs ED Triage Vitals  Enc Vitals Group     BP 03/09/21 1807 (!) 164/110     Pulse  Rate 03/09/21 1807 (!) 109     Resp 03/09/21 1807 18     Temp 03/09/21 1807 98.6 F (37 C)     Temp Source 03/09/21 1807 Oral     SpO2 03/09/21 1807 100 %     Weight 03/09/21 1805 218 lb (98.9 kg)     Height 03/09/21 1805 5' 5"  (1.651 m)     Head Circumference --      Peak Flow --      Pain Score 03/09/21 1805 5     Pain Loc --      Pain Edu? --      Excl. in [place]? --    No data found.  Updated Vital Signs BP (!) 164/110 (BP Location: Left Arm)    Pulse (!) 109    Temp 98.6 F (37 C) (Oral)    Resp 18    Ht 5' 5"  (1.651 m)    Wt 218 lb (98.9 kg)    SpO2 100%    BMI 36.28 kg/m      Physical Exam Vitals and nursing note reviewed.  Constitutional:      General: She is not in acute distress.    Appearance: Normal appearance. She is not ill-appearing or toxic-appearing.  HENT:     Head: Normocephalic and atraumatic.     Nose: Nose normal.     Mouth/Throat:     Mouth: Mucous membranes are moist.     Pharynx: Oropharynx is clear.  Eyes:     General: No scleral icterus.       Right eye: No discharge.        Left eye: No discharge.     Conjunctiva/sclera: Conjunctivae normal.  Cardiovascular:     Rate and Rhythm: Regular rhythm.  Tachycardia present.     Heart sounds: Normal heart sounds.  Pulmonary:     Effort: Pulmonary effort is normal. No respiratory distress.     Breath sounds: Normal breath sounds.  Abdominal:     Palpations: Abdomen is soft.     Tenderness: There is abdominal tenderness (RLQ). There is no right CVA tenderness, left CVA tenderness, guarding or rebound.  Musculoskeletal:     Cervical back: Neck supple.     Right hip: Tenderness (TTP anterior and lateral hip) present. Normal range of motion (increased pain in abdomen and hip with crossing right leg). Normal strength.  Skin:    General: Skin is dry.  Neurological:     General: No focal deficit present.     Mental Status: She is alert. Mental status is at baseline.     Motor: No weakness.     Gait: Gait normal.  Psychiatric:        Mood and Affect: Mood normal.        Behavior: Behavior normal.        Thought Content: Thought content normal.     UC Treatments / Results  Labs (all labs ordered are listed, but only abnormal results are displayed) Labs Reviewed  COMPREHENSIVE METABOLIC PANEL - Abnormal; Notable for the following components:      Result Value   Glucose, Bld 156 (*)    Creatinine, Ser 1.05 (*)    GFR, Estimated 60 (*)    All other components within normal limits  CBC WITH DIFFERENTIAL/PLATELET - Abnormal; Notable for the following components:   WBC 11.4 (*)    All other components within normal limits    EKG   Radiology DG Abdomen 1 View  Result Date: 03/09/2021 CLINICAL DATA:  Right lower quadrant pain for the past week. EXAM: ABDOMEN - 1 VIEW COMPARISON:  None. FINDINGS: The bowel gas pattern is normal. Normal colonic stool burden. No radio-opaque calculi. Enlarged liver shadow. Prior cholecystectomy. IMPRESSION: 1. No acute findings. 2. Hepatomegaly. Electronically Signed   By: Titus Dubin M.D.   On: 03/09/2021 18:31    Procedures Procedures (including critical care time)  Medications Ordered in  UC Medications - No data to display  Initial Impression / Assessment and Plan / UC Course  I have reviewed the triage vital signs and the nursing notes.  Pertinent labs & imaging results that were available during my care of the patient were reviewed by me and considered in my medical decision making (see chart for details).  66 year old female presenting for right lower abdominal pain over the past 1 week.  Pain is worse with movement.  Patient cannot identify any other exacerbating factors and pain is not associated with fever, fatigue, appetite changes, nausea/vomiting/diarrhea or constipation.  Blood pressure elevated 160/110.  Patient says this is not out of the normal for her.  Pulse elevated at 109 bpm.  Patient is mildly anxious.  On exam she does have tenderness palpation of the right lower quadrant.  She also has tenderness palpation of the right lateral hip and right anterior hip.  Full range of motion of the hip but has pain with crossing the right hip, assuming the piriformis stretch.  This is painful for her.  Good strength of lower extremities though.  CBC and CMP obtained.  WBC slight elevated 11.4.  Glucose elevated 156 and GFR 60.  Discussed these labs with patient.  X-ray obtained of abdomen to assess for possible constipation.  X-ray shows no signs of constipation or bowel blockage.  She does have hepatomegaly but this is nothing new to the patient.  Discussed all results with patient.  Given the way the patient describes her pain and the fact that she does have pain of her hip, I suspect her right lower abdominal pain could be due to referred pain from her hip and possibly muscle strain.  Discussed this with her.  At this time, advised low-dose naproxen, stretches, heat.  Prescribed baclofen.  Advised her to make an appointment with PCP for follow-up as well as orthopedics to have her hip checked out.  ED precautions relating to abdominal pain reviewed with patient.  \ Final  Clinical Impressions(s) / UC Diagnoses   Final diagnoses:  Right lower quadrant pain  Right hip pain  Muscle strain     Discharge Instructions      -Your were pain may be due to your hip and possibly a pulled muscle.  Your labs are reassuring.  Your white blood cell count is just a little elevated. - At this time, you should increase rest and fluids.  Start taking Aleve for the discomfort.  I have sent a muscle relaxer too.  Can apply heat to the area to see if that helps. - Make a follow-up appointment with your primary care provider for reevaluation sometime in the next week. - You should go to the ER if you develop a fever or worsening pain.     ED Prescriptions     Medication Sig Dispense Auth. Provider   baclofen (LIORESAL) 10 MG tablet Take 1 tablet (10 mg total) by mouth 2 (two) times daily as needed for muscle spasms. 20 each Danton Clap, PA-C  PDMP not reviewed this encounter.   Danton Clap, PA-C 03/09/21 1931

## 2021-03-09 NOTE — Discharge Instructions (Signed)
-  Your were pain may be due to your hip and possibly a pulled muscle.  Your labs are reassuring.  Your white blood cell count is just a little elevated. - At this time, you should increase rest and fluids.  Start taking Aleve for the discomfort.  I have sent a muscle relaxer too.  Can apply heat to the area to see if that helps. - Make a follow-up appointment with your primary care provider for reevaluation sometime in the next week. - You should go to the ER if you develop a fever or worsening pain.

## 2021-03-09 NOTE — ED Triage Notes (Signed)
Pt c/o right lower stomach pain x1week. Pt states that the pain comes and goes and is tender to touch. Pt states that she can not twist her body or bend over or it causes pain. Pt is constipated. Pts last bowel movement was last night.

## 2021-03-14 ENCOUNTER — Telehealth: Payer: Self-pay

## 2021-03-14 NOTE — Telephone Encounter (Signed)
Copied from [place] [id number]. Topic: General - Other >> Mar 14, 2021 11:24 AM Joanna Bishop wrote: Reason for CRM: Pt called in stating a [name] called her and asked for her to return the call, pt requested if she could give her a call back to see what it was about, please advise.

## 2021-03-20 ENCOUNTER — Ambulatory Visit (INDEPENDENT_AMBULATORY_CARE_PROVIDER_SITE_OTHER)

## 2021-03-20 DIAGNOSIS — E1165 Type 2 diabetes mellitus with hyperglycemia: Secondary | ICD-10-CM

## 2021-03-20 DIAGNOSIS — E785 Hyperlipidemia, unspecified: Secondary | ICD-10-CM

## 2021-03-20 DIAGNOSIS — E1169 Type 2 diabetes mellitus with other specified complication: Secondary | ICD-10-CM

## 2021-03-20 NOTE — Patient Instructions (Signed)
Visit Information It was great speaking with you today!  Please let me know if you have any questions about our visit.   Goals Addressed             This Visit's Progress    Monitor and Manage My Blood Sugar-Diabetes Type 2   On track    Timeframe:  Long-Range Goal Priority:  High Start Date: 01/23/2021                            Expected End Date: 01/23/2022                      Follow Up within 30 days   - check blood sugar before breakfast - check blood sugar if I feel it is too high or too low - enter blood sugar readings and medication or insulin into daily log - take the blood sugar log to all doctor visits    Why is this important?   Checking your blood sugar at home helps to keep it from getting very high or very low.  Writing the results in a diary or log helps the doctor know how to care for you.  Your blood sugar log should have the time, date and the results.  Also, write down the amount of insulin or other medicine that you take.  Other information, like what you ate, exercise done and how you were feeling, will also be helpful.     Notes:         Patient Care Plan: General Pharmacy (Adult)     Problem Identified: Hyperlipidemia, Diabetes, Asthma, Overactive Bladder, and Bipolar Disorder, Chronic Pain, Chronic Migraines   Priority: High     Long-Range Goal: Patient-Specific Goal   Start Date: 01/23/2021  Expected End Date: 01/23/2022  This Visit's Progress: On track  Recent Progress: On track  Priority: High  Note:   Current Barriers:  Unable to independently afford treatment regimen Unable to achieve control of diabetes   Pharmacist Clinical Goal(s):  Patient will verbalize ability to afford treatment regimen achieve control of Diabetes as evidenced by A1c less than 8% through collaboration with PharmD and provider.   Interventions: 1:1 collaboration with Alfredia Ferguson, PA-C regarding development and update of comprehensive plan of care as  evidenced by provider attestation and co-signature Inter-disciplinary care team collaboration (see longitudinal plan of care) Comprehensive medication review performed; medication list updated in electronic medical record  Hyperlipidemia: (LDL goal < 70) -Uncontrolled -Current treatment: Rosuvastatin 5 mg daily (AM): Appropriate, Query effective  -Medications previously tried: NA  -Recommended to continue current medication  Diabetes (A1c goal <8%) -Uncontrolled -Current medications: Glipizide XL 10 mg daily: Appropriate, Query effective  Trulicity 1.5 mg weekly: Appropriate, Query effective -Medications previously tried: Metformin  -Patient reports fatigue first day after administering Trulicity. Nausea improved significantly.  -Current home glucose readings fasting glucose: 127, 135, 226 (late dinner - 2 hot dogs, coleslaw, diet Dr. Reino Kent, cherry pie dessert)  -Reports hyperglycemic symptoms: Fatigue, polydipsia, paresthesia, visual disturbances   -Current meal patterns: Frozen/fresh Fruits + vegetables. Avoids canned food. Red meat 2x weekly. Chicken. Identifies sweets as her weakness.  -Current exercise: Limited by leg pain following knee replacement/femur fracture. Chair exercises 3 times weekly.  -Continue current medications  Asthma (Goal: control symptoms and prevent exacerbations) -Not ideally controlled -Current treatment  Ventolin HFA 1-2 puffs every 4-6 hours as needed Incruse 1 puff daily  Montelukast 10  mg daily  -Medications previously tried: NA  -Exacerbations requiring treatment in last 6 months: None -Patient denies consistent use of maintenance inhaler. Takes 1-2 times monthly costly -Frequency of rescue inhaler use: 1-2 weekly -Counseled on Benefits of consistent maintenance inhaler use -Recommended to continue current medication  Bipolar Disorder (Goal: Achieve symptom remission) -Uncontrolled -Psychiatrist appointment scheduled for next week -Current  treatment: None  -Medications previously tried/failed: Divalproex, Benztropine -Counseled on importance of keeping upcoming psychiatry appointment   Overactive Bladder (Goal: Minimize incontinence symptoms) -Uncontrolled -Current treatment  Oxybutynin XL 10 mg nightly: Query Appropriate AZO Bladder control 1 capsule twice daily  -Medications previously tried: NA  -Patient symptoms improved significantly with reduction in her blood sugars. Patient discontinued oxybutynin and so far symptoms remain well controlled.  -STOP Oxybutynin  Migraines (Goal: Prevent migraines) -Uncontrolled -Current treatment  Excedrin Migraine Relief ([employer] brand) -Medications previously tried: NA  -Migraines occurring 1-2 times weekly.  -Counseled on risk of frequent utilization of aspirin + naproxen.  -Recommended to continue current medication  Chronic Pain (Goal: Minimize symptoms) -Controlled -Managed by Altamese Cabal, PA-C (Ortho) -Current treatment  Boswellia extract 500 mg twice daily  Tramadol 50 mg q6h PRN  -Medications previously tried: NA -Patient also reports CBD gummy use.  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  -Requires rare use of tramadol   -Recommended to continue current medication  Patient Goals/Self-Care Activities Patient will:  - check glucose daily before breakfast, document, and provide at future appointments Complete patient assistance applications for Trulicity, Incruse  Follow Up Plan: Telephone follow up appointment with care management team member scheduled for:  05/15/2020 at 11:00 AM    Patient agreed to services and verbal consent obtained.   Patient verbalizes understanding of instructions and care plan provided today and agrees to view in MyChart. Active MyChart status confirmed with patient.    Angelena Sole, PharmD, P[place]y [place], CPP  Clinical Pharmacist Practitioner  [facility] [phone]

## 2021-03-20 NOTE — Progress Notes (Signed)
**Note Joanna-Identified via Obfuscation** Chronic Care Management Pharmacy Note  03/20/2021 Name:  Joanna Bishop MRN:  [id number] DOB:  May 20, 1955  Summary: Patient presents for CCM follow-up. Still having abdominal pain, but easing off some.   Recommendations/Changes made from today's visit: - Continue current medications  Plan: CPA follow-up in one week to assess blood sugars.  CPP follow-up 1 month  Recommended Problem List Changes:  Add: Migraine  Subjective: Joanna Bishop is an 66 y.o. year old female who is a primary patient of [name], [name], [place].  The CCM team was consulted for assistance with disease management and care coordination needs.    Engaged with patient by telephone for follow up visit in response to provider referral for pharmacy case management and/or care coordination services.   Consent to Services:  The patient was given information about Chronic Care Management services, agreed to services, and gave verbal consent prior to initiation of services.  Please see initial visit note for detailed documentation.   Patient Care Team: [name], PA-C as PCP - General (Physician Assistant) Ocie Doyne, [place] (Optometry) Germaine Pomfret, [facility] (Pharmacist)  Recent office visits: 01/12/21: Patient presented to Dr. Brita Romp for follow-up. A1c 13.8%. Not taking medication since January.   Recent consult visits: None noted.  Hospital visits: 03/09/21: Patient presented to ED due to abdominal pain.   Objective:  Lab Results  Component Value Date   CREATININE 1.05 (H) 03/09/2021   BUN 12 03/09/2021   GFRNONAA 60 (L) 03/09/2021   GFRAA >60 06/25/2019   NA 137 03/09/2021   K 4.3 03/09/2021   CALCIUM 9.5 03/09/2021   CO2 25 03/09/2021   GLUCOSE 156 (H) 03/09/2021    Lab Results  Component Value Date/Time   HGBA1C 13.8 (A) 01/12/2021 01:33 PM   HGBA1C 10.4 (A) 08/28/2019 06:38 PM   HGBA1C 8.8 (H) 03/12/2018 09:32 AM   HGBA1C 6.4 (H) 12/20/2017 09:24 AM    Last diabetic Eye exam:   Lab Results  Component Value Date/Time   HMDIABEYEEXA No Retinopathy 04/04/2020 12:00 AM    Last diabetic Foot exam: No results found for: HMDIABFOOTEX   Lab Results  Component Value Date   CHOL 170 01/26/2021   HDL 44 01/26/2021   LDLCALC 82 01/26/2021   TRIG 267 (H) 01/26/2021   CHOLHDL 3.9 01/26/2021    Hepatic Function Latest Ref Rng & Units 03/09/2021 01/26/2021 03/12/2018  Total Protein 6.5 - 8.1 g/dL 7.4 6.5 6.5  Albumin 3.5 - 5.0 g/dL 4.5 4.5 4.2  AST 15 - 41 U/L 30 30 34  ALT 0 - 44 U/L 33 38(H) 45(H)  Alk Phosphatase 38 - 126 U/L 90 141(H) 95  Total Bilirubin 0.3 - 1.2 mg/dL 1.2 0.7 0.4    Lab Results  Component Value Date/Time   TSH 2.510 10/14/2015 11:41 AM   TSH 3.030 07/12/2015 07:23 PM   TSH 1.730 09/13/2014 10:26 AM    CBC Latest Ref Rng & Units 03/09/2021 06/25/2019 03/12/2018  WBC 4.0 - 10.5 K/uL 11.4(H) 9.5 8.1  Hemoglobin 12.0 - 15.0 g/dL 13.5 14.2 13.6  Hematocrit 36.0 - 46.0 % 39.2 41.6 40.6  Platelets 150 - 400 K/uL 257 195 191    Lab Results  Component Value Date/Time   VD25OH 29.0 (L) 10/14/2015 11:41 AM    Clinical ASCVD: No  The 10-year ASCVD risk score (Arnett DK, et al., 2019) is: 13.7%   Values used to calculate the score:     Age: 66 years     Sex:  Female     Is Non-Hispanic African American: No     Diabetic: Yes     Tobacco smoker: No     Systolic Blood Pressure: 357 mmHg     Is BP treated: No     HDL Cholesterol: 44 mg/dL     Total Cholesterol: 170 mg/dL    Depression screen [facility] 2/9 03/08/2021 01/12/2021 03/02/2020  Decreased Interest 0 2 0  Down, Depressed, Hopeless 0 2 0  PHQ - 2 Score 0 4 0  Altered sleeping 0 3 -  Tired, decreased energy 0 3 -  Change in appetite 0 2 -  Feeling bad or failure about yourself  0 2 -  Trouble concentrating 0 3 -  Moving slowly or fidgety/restless 0 2 -  Suicidal thoughts 0 1 -  PHQ-9 Score 0 20 -  Difficult doing work/chores Not difficult at all Very difficult -    Social History    Tobacco Use  Smoking Status Never  Smokeless Tobacco Never   BP Readings from Last 3 Encounters:  03/09/21 (!) 164/110  01/12/21 136/88  08/28/19 (!) 143/85   Pulse Readings from Last 3 Encounters:  03/09/21 (!) 109  01/12/21 (!) 109  08/28/19 (!) 108   Wt Readings from Last 3 Encounters:  03/09/21 218 lb (98.9 kg)  01/12/21 218 lb (98.9 kg)  08/28/19 252 lb 9.6 oz (114.6 kg)   BMI Readings from Last 3 Encounters:  03/09/21 36.28 kg/m  01/12/21 37.42 kg/m  08/28/19 42.69 kg/m    Assessment/Interventions: Review of patient past medical history, allergies, medications, health status, including review of consultants reports, laboratory and other test data, was performed as part of comprehensive evaluation and provision of chronic care management services.   SDOH:  (Social Determinants of Health) assessments and interventions performed: Yes SDOH Interventions    Flowsheet Row Most Recent Value  SDOH Interventions   Financial Strain Interventions Intervention Not Indicated        SDOH Screenings   Alcohol Screen: Low Risk    Last Alcohol Screening Score (AUDIT): 0  Depression (PHQ2-9): Low Risk    PHQ-2 Score: 0  Financial Resource Strain: Low Risk    Difficulty of Paying Living Expenses: Not hard at all  Food Insecurity: No Food Insecurity   Worried About Charity fundraiser in the Last Year: Never true   Ran Out of Food in the Last Year: Never true  Housing: Low Risk    Last Housing Risk Score: 0  Physical Activity: Insufficiently Active   Days of Exercise per Week: 4 days   Minutes of Exercise per Session: 10 min  Social Connections: Socially Isolated   Frequency of Communication with Friends and Family: More than three times a week   Frequency of Social Gatherings with Friends and Family: Twice a week   Attends Religious Services: Never   Marine scientist or Organizations: No   Attends Archivist Meetings: Never   Marital Status:  Widowed  Stress: No Stress Concern Present   Feeling of Stress : Not at all  Tobacco Use: Low Risk    Smoking Tobacco Use: Never   Smokeless Tobacco Use: Never   Passive Exposure: Not on file  Transportation Needs: No Transportation Needs   Lack of Transportation (Medical): No   Lack of Transportation (Non-Medical): No    CCM Care Plan  Allergies  Allergen Reactions   Penicillins Swelling    Medications Reviewed Today     Reviewed  by Ezequiel Ganser, CMA (Certified Medical Assistant) on 03/09/21 at 1806  Med List Status: <None>   Medication Order Taking? Sig Documenting Provider Last Dose Status Informant  albuterol (VENTOLIN HFA) 108 (90 Base) MCG/ACT inhaler [id number]  INHALE 1-2 PUFFS INTO THE LUNGS EVERY 4 TO 6 HOURS AS NEEDED FOR WHEEZING OR SHORTNESS OF BREATH. [name], PA-C  Active   aspirin 81 MG tablet [id number]  Take by mouth daily. [provider]  Active            Med Note Gillie Manners Aug 02, 2014  4:58 PM) Received from: [facility]:   aspirin-acetaminophen-caffeine (EXCEDRIN MIGRAINE) [phone] MG tablet [id number]  Take 1 tablet by mouth every 6 (six) hours as needed for headache. [provider]  Active   blood glucose meter kit and supplies [id number]  Dispense based on patient and insurance preference. Use daily as directed. 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 Virginia Crews, MD  Active   Boswellia Serrata (BOSWELLIA PO) [id number]  Take 500 mg by mouth in the morning and at bedtime. [provider]  Active   Dulaglutide (TRULICITY) 1.5 UY/4.0HK SOPN [id number]  Inject 1.5 mg into the skin once a week. Virginia Crews, MD  Active   glipiZIDE (GLUCOTROL XL) 10 MG 24 hr tablet [id number]  Take 1 tablet (10 mg total) by mouth daily with breakfast. Virginia Crews, MD  Active   glucose blood [facility] VERIO) test strip [id number]  Use to check blood sugars once daily as instructed Virginia Crews, MD  Active   montelukast (SINGULAIR) 10 MG tablet [id number]  Take 1 tablet (10 mg total) by mouth daily. Virginia Crews, MD  Active   Multiple Vitamin (MULTIVITAMIN WITH MINERALS) TABS tablet [id number]  Take 1 tablet by mouth daily. Centrum Silver Women's 50+ [provider]  Active   naproxen sodium (ALEVE) 220 MG tablet [id number]  Take 220 mg by mouth daily. [provider]  Active   Baruch Gouty [id number]  Take 1 capsule by mouth in the morning and at bedtime. AZO Bladder Control Supplement [provider]  Active   OneTouch Delica Lancets 61Y MISC [id number]  Use to check blood sugars once daily as directed. Virginia Crews, MD  Active   oxybutynin (DITROPAN-XL) 10 MG 24 hr tablet [id number]  TAKE 1 TABLET BY MOUTH EVERYDAY AT BEDTIME  Patient not taking: Reported on 03/08/2021   Virginia Crews, MD  Active   rosuvastatin (CRESTOR) 5 MG tablet [id number]  Take 1 tablet (5 mg total) by mouth daily. Virginia Crews, MD  Active   traMADol Veatrice Bourbon) 50 MG tablet [id number]  Take by mouth every 6 (six) hours as needed. [provider]  Active   umeclidinium bromide (INCRUSE ELLIPTA) 62.5 MCG/ACT AEPB [id number]  Inhale 1 puff into the lungs daily. Virginia Crews, MD  Active             Patient Active Problem List   Diagnosis Date Noted   OAB (overactive bladder) 01/12/2021   History of total knee arthroplasty 05/09/2020   Morbid obesity ([place]) 08/28/2019   Stiffness of right knee 06/18/2018   Type 2 diabetes mellitus with hyperglycemia, without long-term current use of insulin ([place]) 10/17/2015   Carpal tunnel syndrome of left wrist 10/14/2015   Bipolar depression ([place]) 07/12/2015   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   AB (asthmatic bronchitis) 08/02/2014   Hyperlipidemia associated with type 2 diabetes  mellitus ([place]) 08/02/2014   History of suicidal ideation 08/02/2014   Avitaminosis D 08/02/2014   Generalized  hyperhidrosis 08/02/2014   Airway hyperreactivity 08/02/2014   Anxiety disorder 08/02/2014   Insomnia 08/02/2014   Major depressive disorder, single episode 08/02/2014   Shortness of breath 08/05/2009    Immunization History  Administered Date(s) Administered   Influenza,inj,Quad PF,6+ Mos 01/06/2018, 12/12/2018   Pneumococcal Polysaccharide-23 07/30/2012   Tdap 07/28/2009    Conditions to be addressed/monitored:  Hyperlipidemia, Diabetes, Asthma, Overactive Bladder, and Bipolar Disorder, Chronic Pain, Chronic Migraines  Care Plan : General Pharmacy (Adult)  Updates made by Germaine Pomfret, RPH since 03/20/2021 12:00 AM     Problem: Hyperlipidemia, Diabetes, Asthma, Overactive Bladder, and Bipolar Disorder, Chronic Pain, Chronic Migraines   Priority: High     Long-Range Goal: Patient-Specific Goal   Start Date: 01/23/2021  Expected End Date: 01/23/2022  This Visit's Progress: On track  Recent Progress: On track  Priority: High  Note:   Current Barriers:  Unable to independently afford treatment regimen Unable to achieve control of diabetes   Pharmacist Clinical Goal(s):  Patient will verbalize ability to afford treatment regimen achieve control of Diabetes as evidenced by A1c less than 8% through collaboration with PharmD and provider.   Interventions: 1:1 collaboration with [name], PA-C regarding development and update of comprehensive plan of care as evidenced by provider attestation and co-signature Inter-disciplinary care team collaboration (see longitudinal plan of care) Comprehensive medication review performed; medication list updated in electronic medical record  Hyperlipidemia: (LDL goal < 70) -Uncontrolled -Current treatment: Rosuvastatin 5 mg daily (AM): Appropriate, Query effective  -Medications previously tried: NA  -Recommended to continue current medication  Diabetes (A1c goal <8%) -Uncontrolled -Current medications: Glipizide XL 10  mg daily: Appropriate, Query effective  Trulicity 1.5 mg weekly: Appropriate, Query effective -Medications previously tried: Metformin  -Patient reports fatigue first day after administering Trulicity. Nausea improved significantly.  -Current home glucose readings fasting glucose: 127, 135, 226 (late dinner - 2 hot dogs, coleslaw, diet Dr. Malachi Bonds, cherry pie dessert)  -Reports hyperglycemic symptoms: Fatigue, polydipsia, paresthesia, visual disturbances   -Current meal patterns: Frozen/fresh Fruits + vegetables. Avoids canned food. Red meat 2x weekly. Chicken. Identifies sweets as her weakness.  -Current exercise: Limited by leg pain following knee replacement/femur fracture. Chair exercises 3 times weekly.  -Continue current medications  Asthma (Goal: control symptoms and prevent exacerbations) -Not ideally controlled -Current treatment  Ventolin HFA 1-2 puffs every 4-6 hours as needed Incruse 1 puff daily  Montelukast 10 mg daily  -Medications previously tried: NA  -Exacerbations requiring treatment in last 6 months: None -Patient denies consistent use of maintenance inhaler. Takes 1-2 times monthly costly -Frequency of rescue inhaler use: 1-2 weekly -Counseled on Benefits of consistent maintenance inhaler use -Recommended to continue current medication  Bipolar Disorder (Goal: Achieve symptom remission) -Uncontrolled -Psychiatrist appointment scheduled for next week -Current treatment: None  -Medications previously tried/failed: Divalproex, Benztropine -Counseled on importance of keeping upcoming psychiatry appointment   Overactive Bladder (Goal: Minimize incontinence symptoms) -Uncontrolled -Current treatment  Oxybutynin XL 10 mg nightly: Query Appropriate AZO Bladder control 1 capsule twice daily  -Medications previously tried: NA  -Patient symptoms improved significantly with reduction in her blood sugars. Patient discontinued oxybutynin and so far symptoms remain well  controlled.  -STOP Oxybutynin  Migraines (Goal: Prevent migraines) -Uncontrolled -Current treatment  Excedrin Migraine Relief ([employer] brand) -Medications previously tried: NA  -Migraines occurring 1-2 times weekly.  -Counseled on risk of frequent utilization  of aspirin + naproxen.  -Recommended to continue current medication  Chronic Pain (Goal: Minimize symptoms) -Controlled -Managed by Carlynn Spry, PA-C (Ortho) -Current treatment  Boswellia extract 500 mg twice daily  Tramadol 50 mg q6h PRN  -Medications previously tried: NA -Patient also reports CBD gummy use.  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  -Requires rare use of tramadol   -Recommended to continue current medication  Patient Goals/Self-Care Activities Patient will:  - check glucose daily before breakfast, document, and provide at future appointments Complete patient assistance applications for Trulicity, Incruse  Follow Up Plan: Telephone follow up appointment with care management team member scheduled for:  05/15/2020 at 11:00 AM        Medication Assistance: None required.  Patient affirms current coverage meets needs. Patient receives Incruse, Musician through [employer].   Compliance/Adherence/Medication fill history: Care Gaps: Covid Vaccine  HIV Screening  Shingrix  Pneumonia  Foot Exam  Influenza   Star-Rating Drugs: Rosuvastatin 5 mg: LF 02/16/2021 for 56-DS   Patient's preferred pharmacy is:  [facility] - [place], [place] - [address] [place] [address] Phone: [phone] Fax: [phone]  [facility] [place], [place] - [address] [place] [address] Phone: [phone] Fax: [phone]  Uses pill box? Yes Pt endorses 100% compliance  Patient decided to: Utilize [facility] for medication synchronization,  packaging and delivery  Care Plan and Follow Up Patient Decision:  Patient agrees to Care Plan and Follow-up.  Plan: Telephone follow up appointment with care management team member scheduled for:  05/15/2020 at 11:00 AM  Junius Argyle, PharmD, [place], [place] [phone]

## 2021-03-28 DIAGNOSIS — E1169 Type 2 diabetes mellitus with other specified complication: Secondary | ICD-10-CM

## 2021-03-28 DIAGNOSIS — Z7984 Long term (current) use of oral hypoglycemic drugs: Secondary | ICD-10-CM | POA: Diagnosis not present

## 2021-03-28 DIAGNOSIS — E1165 Type 2 diabetes mellitus with hyperglycemia: Secondary | ICD-10-CM | POA: Diagnosis not present

## 2021-03-28 DIAGNOSIS — E785 Hyperlipidemia, unspecified: Secondary | ICD-10-CM | POA: Diagnosis not present

## 2021-04-06 DIAGNOSIS — Z780 Asymptomatic menopausal state: Secondary | ICD-10-CM | POA: Diagnosis not present

## 2021-04-06 DIAGNOSIS — Z13820 Encounter for screening for osteoporosis: Secondary | ICD-10-CM | POA: Diagnosis not present

## 2021-04-06 LAB — HM DEXA SCAN: HM Dexa Scan: NORMAL

## 2021-04-10 ENCOUNTER — Telehealth: Payer: Self-pay

## 2021-04-10 NOTE — Progress Notes (Signed)
Chronic Care Management Pharmacy Assistant   Name: Joanna Bishop  MRN: [id number] DOB: 1955-11-08  Reason for Encounter: Medication Review/Medication Coordination Call  Recent office visits:  None ID  Recent consult visits:  None ID   Hospital visits:  None in previous 6 months  Medications: Outpatient Encounter Medications as of 04/10/2021  Medication Sig Note   albuterol (VENTOLIN HFA) 108 (90 Base) MCG/ACT inhaler INHALE 1-2 PUFFS INTO THE LUNGS EVERY 4 TO 6 HOURS AS NEEDED FOR WHEEZING OR SHORTNESS OF BREATH.    aspirin 81 MG tablet Take by mouth daily. 08/02/2014: Received from: [place]:    aspirin-acetaminophen-caffeine (EXCEDRIN MIGRAINE) 250-250-65 MG tablet Take 1 tablet by mouth every 6 (six) hours as needed for headache.    baclofen (LIORESAL) 10 MG tablet Take 1 tablet (10 mg total) by mouth 2 (two) times daily as needed for muscle spasms.    blood glucose meter kit and supplies Dispense based on patient and insurance preference. Use daily as directed. 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    Boswellia Serrata (BOSWELLIA PO) Take 500 mg by mouth in the morning and at bedtime.    Dulaglutide (TRULICITY) 1.5 UG/8.9VQ SOPN Inject 1.5 mg into the skin once a week.    glipiZIDE (GLUCOTROL XL) 10 MG 24 hr tablet Take 1 tablet (10 mg total) by mouth daily with breakfast.    glucose blood (ONETOUCH VERIO) test strip Use to check blood sugars once daily as instructed    montelukast (SINGULAIR) 10 MG tablet Take 1 tablet (10 mg total) by mouth daily.    Multiple Vitamin (MULTIVITAMIN WITH MINERALS) TABS tablet Take 1 tablet by mouth daily. Centrum Silver Women's 50+    naproxen sodium (ALEVE) 220 MG tablet Take 220 mg by mouth daily.    NON FORMULARY Take 1 capsule by mouth in the morning and at bedtime. AZO Bladder Control Supplement    OneTouch Delica Lancets 94H MISC Use to check blood sugars once daily as directed.    rosuvastatin (CRESTOR) 5 MG tablet Take 1  tablet (5 mg total) by mouth daily.    traMADol (ULTRAM) 50 MG tablet Take by mouth every 6 (six) hours as needed.    umeclidinium bromide (INCRUSE ELLIPTA) 62.5 MCG/ACT AEPB Inhale 1 puff into the lungs daily.    [DISCONTINUED] losartan (COZAAR) 25 MG tablet TAKE 2 TABLETS BY MOUTH EVERY DAY    [DISCONTINUED] XARELTO 10 MG TABS tablet Take 10 mg by mouth every morning.    No facility-administered encounter medications on file as of 04/10/2021.   Care Gaps: COVID-19 Vaccine HIV Screening Zoster Vaccines Diabetic Foot Exam Tetanus/TDAP Influenza Vaccine Diabetic Eye Exam Mammogram Urine Microalbumin A1C> 9  Star Rating Drugs: Trulicity 1.5 mg I believe [facility] Rosuv[facility]atin 5 mg last filled on 12/22/222 for a 61-Day supply with [facility] Glipizide 10 mg last filled on 01/17/2021 for a 56-Day supply with [facility]  BP Readings from Last 3 Encounters:  03/09/21 (!) 164/110  01/12/21 136/88  08/28/19 (!) 143/85    Lab Results  Component Value Date   HGBA1C 13.8 (A) 01/12/2021     Patient obtains medications through Adherence Packaging  30 Days   Last adherence delivery included:  This is my 1st adherence call with this patient so I am unsure what her last delivery consisted of  Patient declined medications last month: This is my first call with the patient I am unsure if  any medications were declined last month  Patient is due for next adherence delivery on: 04/20/2021 (Thursday) [facility]  Called patient and reviewed medications and coordinated delivery.  This delivery to include: Rosuvastatin 5 mg 1 tablet daily (Breakfast) Glipizide 10 mg 1 tablet daily (Breakfast) Montelukast 10 mg 1 tablet daily (Bedtime) Oxybutynin 10 mg 1 tablet daily (Bedtime) OneTouch Verio Test Strips Oncetouch Lancets  Patient declined the following medications: Albuterol 108 Inhaler inhale 1-2 puffs every every 4-6 hours prn  for wheezing or shortness of breath-PRN Medication Trulicity Inject 1.5 mg into the skin once a week- Patient is getting this medication through [facility] Incruse Ellipta 62.5 mcg Inhale 1 puff into lungs daily-Patient is getting this medication through [facility]  Patient needs refills for Nothing at this time.  Confirmed delivery date of 04/20/2021 [facility], advised patient that pharmacy will contact them the morning of delivery.  I spoke with the patient, and she reports that she is doing well. She denies any ill symptoms at this time. The patient reports that her blood sugar yesterday after a meal was 143. She stated after a meal her blood sugars have been running between 120-143. Patient reports she has not been able to take her blood sugars in the morning as when she awakes she is feeling a little light headed and disoriented so she tries to eat and drink immediately. I encouraged the patient to take her blood sugars when she feels this way so CPP can see what her numbers are, and he can make sure she is not running to low in the mornings. She does report going to bed early recently and going longer without eating or drinking. Patient verbalized understanding, and will check her blood sugars when she feels this way and keep a log.  Patient has  a scheduled telephone appointment with Junius Argyle, CPP on 05/15/2021 @ [place], [place] [employer], theatre/television/film Phone: [phone]

## 2021-04-12 DIAGNOSIS — M5412 Radiculopathy, cervical region: Secondary | ICD-10-CM | POA: Diagnosis not present

## 2021-04-17 ENCOUNTER — Other Ambulatory Visit: Payer: Self-pay | Admitting: Physician Assistant

## 2021-04-19 ENCOUNTER — Ambulatory Visit: Admitting: Physician Assistant

## 2021-04-26 NOTE — Progress Notes (Unsigned)
Established Patient Office Visit  Subjective:  Patient ID: DEZIRAE SERVICE, female    DOB: 10-16-1955  Age: 66 y.o. MRN: [id number]  CC: No chief complaint on file.   HPI Joanna Bishop presents for diabetes follow-up. Diabetes Mellitus Type II, Follow-up  Lab Results  Component Value Date   HGBA1C 13.8 (A) 01/12/2021   HGBA1C 10.4 (A) 08/28/2019   HGBA1C 7.9 (A) 12/12/2018   Wt Readings from Last 3 Encounters:  03/09/21 218 lb (98.9 kg)  01/12/21 218 lb (98.9 kg)  08/28/19 252 lb 9.6 oz (114.6 kg)   Last seen for diabetes 3 months ago.  Management since then includes resume trulicity and glipizide. She reports {excellent/good/fair/poor:19665} compliance with treatment. She {is/is not:21021397} having side effects. {document side effects if present:1} Symptoms: {Yes/No:20286} fatigue {Yes/No:20286} foot ulcerations  {Yes/No:20286} appetite changes {Yes/No:20286} nausea  {Yes/No:20286} paresthesia of the feet  {Yes/No:20286} polydipsia  {Yes/No:20286} polyuria {Yes/No:20286} visual disturbances   {Yes/No:20286} vomiting     Home blood sugar records: {diabetes glucometry results:16657}  Episodes of hypoglycemia? {Yes/No:20286} {enter symptoms and frequency of symptoms if yes:1}   Current insulin regiment: {enter 'none' or type of insulin and number of units taken with each dose of each insulin formulation that the patient is taking:1} Most Recent Eye Exam: *** {Current exercise:16438:::1} {Current diet habits:16563:::1}  Pertinent Labs: Lab Results  Component Value Date   CHOL 170 01/26/2021   HDL 44 01/26/2021   LDLCALC 82 01/26/2021   TRIG 267 (H) 01/26/2021   CHOLHDL 3.9 01/26/2021   Lab Results  Component Value Date   NA 137 03/09/2021   K 4.3 03/09/2021   CREATININE 1.05 (H) 03/09/2021   GFRNONAA 60 (L) 03/09/2021   LABMICR 146.2 12/20/2017     ---------------------------------------------------------------------------------------------------   Past  Medical History:  Diagnosis Date   Anxiety    Arthritis    shoulders, legs   Asthma    Depression    Diabetes mellitus without complication (HCC)    Hyperlipidemia    Migraine headache    2x/month   Vertigo     Past Surgical History:  Procedure Laterality Date   ABDOMINAL HYSTERECTOMY     ARTHROPLASTY     CHOLECYSTECTOMY     FEMUR FRACTURE SURGERY     TOTAL KNEE ARTHROPLASTY Left     Family History  Problem Relation Age of Onset   Diabetes Mother    COPD Mother    Diabetes Father    Coronary artery disease Father    Coronary artery disease Maternal Grandmother    Heart attack Maternal Grandmother    Diabetes Paternal Grandmother     Social History   Socioeconomic History   Marital status: Widowed    Spouse name: Not on file   Number of children: 3   Years of education: Not on file   Highest education level: Some college, no degree  Occupational History   Occupation: disability  Tobacco Use   Smoking status: Never   Smokeless tobacco: Never  Vaping Use   Vaping Use: Never used  Substance and Sexual Activity   Alcohol use: Not Currently    Comment: 1 drink every 3 months   Drug use: Never   Sexual activity: Not Currently  Other Topics Concern   Not on file  Social History Narrative   Not on file   Social Determinants of Health   Financial Resource Strain: Low Risk    Difficulty of Paying Living Expenses: Not hard at all  Food  Insecurity: No Food Insecurity   Worried About Charity fundraiser in the Last Year: Never true   Ran Out of Food in the Last Year: Never true  Transportation Needs: No Transportation Needs   Lack of Transportation (Medical): No   Lack of Transportation (Non-Medical): No  Physical Activity: Insufficiently Active   Days of Exercise per Week: 4 days   Minutes of Exercise per Session: 10 min  Stress: No Stress Concern Present   Feeling of Stress : Not at all  Social Connections: Socially Isolated   Frequency of Communication  with Friends and Family: More than three times a week   Frequency of Social Gatherings with Friends and Family: Twice a week   Attends Religious Services: Never   Marine scientist or Organizations: No   Attends Archivist Meetings: Never   Marital Status: Widowed  Human resources officer Violence: Not At Risk   Fear of Current or Ex-Partner: No   Emotionally Abused: No   Physically Abused: No   Sexually Abused: No    Outpatient Medications Prior to Visit  Medication Sig Dispense Refill   albuterol (VENTOLIN HFA) 108 (90 Base) MCG/ACT inhaler INHALE 1-2 PUFFS INTO THE LUNGS EVERY 4 TO 6 HOURS AS NEEDED FOR WHEEZING OR SHORTNESS OF BREATH. 18 each 4   aspirin 81 MG tablet Take by mouth daily.     aspirin-acetaminophen-caffeine (EXCEDRIN MIGRAINE) 250-250-65 MG tablet Take 1 tablet by mouth every 6 (six) hours as needed for headache.     baclofen (LIORESAL) 10 MG tablet Take 1 tablet (10 mg total) by mouth 2 (two) times daily as needed for muscle spasms. 20 each 0   blood glucose meter kit and supplies Dispense based on patient and insurance preference. Use daily as directed. 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 1 each 0   Boswellia Serrata (BOSWELLIA PO) Take 500 mg by mouth in the morning and at bedtime.     Dulaglutide (TRULICITY) 1.5 EN/2.7PO SOPN Inject 1.5 mg into the skin once a week. 6 mL 3   glipiZIDE (GLUCOTROL XL) 10 MG 24 hr tablet TAKE ONE TABLET BY MOUTH EVERY MORNING with breakfast 90 tablet 1   glucose blood (ONETOUCH VERIO) test strip Use to check blood sugars once daily as instructed 100 each 12   montelukast (SINGULAIR) 10 MG tablet Take 1 tablet (10 mg total) by mouth daily. 90 tablet 1   Multiple Vitamin (MULTIVITAMIN WITH MINERALS) TABS tablet Take 1 tablet by mouth daily. Centrum Silver Women's 50+     naproxen sodium (ALEVE) 220 MG tablet Take 220 mg by mouth daily.     NON FORMULARY Take 1 capsule by mouth in the morning and at bedtime. AZO Bladder Control Supplement      OneTouch Delica Lancets 24M MISC Use to check blood sugars once daily as directed. 100 each 12   rosuvastatin (CRESTOR) 5 MG tablet Take 1 tablet (5 mg total) by mouth daily. 90 tablet 1   traMADol (ULTRAM) 50 MG tablet Take by mouth every 6 (six) hours as needed.     umeclidinium bromide (INCRUSE ELLIPTA) 62.5 MCG/ACT AEPB Inhale 1 puff into the lungs daily. 90 each 3   No facility-administered medications prior to visit.    Allergies  Allergen Reactions   Penicillins Swelling    ROS Review of Systems    Objective:    Physical Exam  There were no vitals taken for this visit. Wt Readings from Last 3 Encounters:  03/09/21 218 lb (98.9  kg)  01/12/21 218 lb (98.9 kg)  08/28/19 252 lb 9.6 oz (114.6 kg)     Health Maintenance Due  Topic Date Due   COVID-19 Vaccine (1) Never done   HIV Screening  Never done   MAMMOGRAM  Never done   Zoster Vaccines- Shingrix (1 of 2) Never done   URINE MICROALBUMIN  12/21/2018   FOOT EXAM  01/07/2019   TETANUS/TDAP  07/29/2019   INFLUENZA VACCINE  09/26/2020   OPHTHALMOLOGY EXAM  04/04/2021    There are no preventive care reminders to display for this patient.  Lab Results  Component Value Date   TSH 2.510 10/14/2015   Lab Results  Component Value Date   WBC 11.4 (H) 03/09/2021   HGB 13.5 03/09/2021   HCT 39.2 03/09/2021   MCV 86.3 03/09/2021   PLT 257 03/09/2021   Lab Results  Component Value Date   NA 137 03/09/2021   K 4.3 03/09/2021   CO2 25 03/09/2021   GLUCOSE 156 (H) 03/09/2021   BUN 12 03/09/2021   CREATININE 1.05 (H) 03/09/2021   BILITOT 1.2 03/09/2021   ALKPHOS 90 03/09/2021   AST 30 03/09/2021   ALT 33 03/09/2021   PROT 7.4 03/09/2021   ALBUMIN 4.5 03/09/2021   CALCIUM 9.5 03/09/2021   ANIONGAP 8 03/09/2021   EGFR 71 01/26/2021   Lab Results  Component Value Date   CHOL 170 01/26/2021   Lab Results  Component Value Date   HDL 44 01/26/2021   Lab Results  Component Value Date   LDLCALC 82  01/26/2021   Lab Results  Component Value Date   TRIG 267 (H) 01/26/2021   Lab Results  Component Value Date   CHOLHDL 3.9 01/26/2021   Lab Results  Component Value Date   HGBA1C 13.8 (A) 01/12/2021      Assessment & Plan:   Problem List Items Addressed This Visit   None   No orders of the defined types were placed in this encounter.   Follow-up: No follow-ups on file.    Beverlee Nims, [place]

## 2021-04-27 ENCOUNTER — Ambulatory Visit: Admitting: Physician Assistant

## 2021-05-02 ENCOUNTER — Encounter: Payer: Self-pay | Admitting: Physician Assistant

## 2021-05-02 ENCOUNTER — Other Ambulatory Visit: Payer: Self-pay

## 2021-05-02 ENCOUNTER — Ambulatory Visit (INDEPENDENT_AMBULATORY_CARE_PROVIDER_SITE_OTHER): Payer: Medicare HMO | Admitting: Physician Assistant

## 2021-05-02 VITALS — BP 165/93 | HR 113 | Ht 64.0 in | Wt 220.3 lb

## 2021-05-02 DIAGNOSIS — E1159 Type 2 diabetes mellitus with other circulatory complications: Secondary | ICD-10-CM | POA: Diagnosis not present

## 2021-05-02 DIAGNOSIS — I152 Hypertension secondary to endocrine disorders: Secondary | ICD-10-CM

## 2021-05-02 DIAGNOSIS — M5412 Radiculopathy, cervical region: Secondary | ICD-10-CM | POA: Diagnosis not present

## 2021-05-02 DIAGNOSIS — Z23 Encounter for immunization: Secondary | ICD-10-CM

## 2021-05-02 DIAGNOSIS — M25562 Pain in left knee: Secondary | ICD-10-CM | POA: Diagnosis not present

## 2021-05-02 DIAGNOSIS — E1165 Type 2 diabetes mellitus with hyperglycemia: Secondary | ICD-10-CM | POA: Diagnosis not present

## 2021-05-02 DIAGNOSIS — E1142 Type 2 diabetes mellitus with diabetic polyneuropathy: Secondary | ICD-10-CM | POA: Diagnosis not present

## 2021-05-02 DIAGNOSIS — F319 Bipolar disorder, unspecified: Secondary | ICD-10-CM | POA: Diagnosis not present

## 2021-05-02 DIAGNOSIS — G8929 Other chronic pain: Secondary | ICD-10-CM | POA: Insufficient documentation

## 2021-05-02 DIAGNOSIS — R69 Illness, unspecified: Secondary | ICD-10-CM | POA: Diagnosis not present

## 2021-05-02 DIAGNOSIS — J4540 Moderate persistent asthma, uncomplicated: Secondary | ICD-10-CM

## 2021-05-02 DIAGNOSIS — Z96652 Presence of left artificial knee joint: Secondary | ICD-10-CM | POA: Diagnosis not present

## 2021-05-02 MED ORDER — ZOSTER VAC RECOMB ADJUVANTED 50 MCG/0.5ML IM SUSR: 0.5000 mL | Freq: Once | INTRAMUSCULAR | 0 refills | Status: AC

## 2021-05-02 MED ORDER — TRAMADOL HCL 50 MG PO TABS: 50.0000 mg | ORAL_TABLET | Freq: Four times a day (QID) | ORAL | 0 refills | Status: DC | PRN

## 2021-05-02 MED ORDER — LOSARTAN POTASSIUM 50 MG PO TABS: 50.0000 mg | ORAL_TABLET | Freq: Every day | ORAL | 1 refills | Status: DC

## 2021-05-02 MED ORDER — TETANUS-DIPHTHERIA TOXOIDS TD 5-2 LFU IM INJ: 0.5000 mL | INJECTION | Freq: Once | INTRAMUSCULAR | 0 refills | Status: AC

## 2021-05-02 MED ORDER — ALBUTEROL SULFATE HFA 108 (90 BASE) MCG/ACT IN AERS: INHALATION_SPRAY | RESPIRATORY_TRACT | 4 refills | Status: DC

## 2021-05-02 MED ORDER — TRULICITY 3 MG/0.5ML ~~LOC~~ SOAJ: 3.0000 mg | SUBCUTANEOUS | 6 refills | Status: DC

## 2021-05-02 NOTE — Progress Notes (Signed)
I,[name],acting as a Education administrator for [employer], PA-C.,have documented all relevant documentation on the behalf of Joanna Kirschner, PA-C,as directed by  Joanna Kirschner, PA-C while in the presence of Joanna Kirschner, PA-C.  Established Patient Office Visit  Subjective:  Patient ID: Joanna Bishop, female    DOB: [birth year]  Age: 66 y.o. MRN: [id number]  CC:  Chief Complaint  Patient presents with   Diabetes   Follow-up   Asthma -Always worsens with the seasons, taking albuterol inhaler several times a day. Reports using Ellipta daily and taking Montelukast  Depression -Worsening, increased stress at home and work, physical health taking a toll. Previously seen psychiatry and therapy. Had previously taken several different psych medications. Admits to sometimes not wanting to be around, but denies suicidal intention or plan. PHQ9 SCORE ONLY 05/02/2021 03/08/2021 01/12/2021  PHQ-9 Total Score 25 0 20   Chronic neck pain/ left knee pain -Reports history of L knee/thigh pain secondary to left knee replacement and traumatic L femur fracture. -Newer onset cervical radiculopathy  Has taken a year to use all 30 tramadol pills, takes only with severe pain. Otherwise manages pain with alleve.   HPI [name] presents for diabetes follow-up.  Diabetes Mellitus Type II, Follow-up  Lab Results  Component Value Date   HGBA1C 13.8 (A) 01/12/2021   HGBA1C 10.4 (A) 08/28/2019   HGBA1C 7.9 (A) 12/12/2018   Wt Readings from Last 3 Encounters:  05/02/21 220 lb 4.8 oz (99.9 kg)  03/09/21 218 lb (98.9 kg)  01/12/21 218 lb (98.9 kg)   Last seen for diabetes 3 months ago.  Management since then includes resume trulicity and glipizide. She reports excellent compliance with treatment. She is not having side effects.  Symptoms: No fatigue No foot ulcerations  No appetite changes No nausea  No paresthesia of the feet  No polydipsia  No polyuria No visual disturbances   No vomiting      Home blood sugar records:  not being checked as often as she should  Episodes of hypoglycemia? No    Current insulin regiment:none Most Recent Eye Exam: August 2022 Current exercise: [place] Current diet habits: well balanced  Pertinent Labs: Lab Results  Component Value Date   CHOL 170 01/26/2021   HDL 44 01/26/2021   LDLCALC 82 01/26/2021   TRIG 267 (H) 01/26/2021   CHOLHDL 3.9 01/26/2021   Lab Results  Component Value Date   NA 137 03/09/2021   K 4.3 03/09/2021   CREATININE 1.05 (H) 03/09/2021   GFRNONAA 60 (L) 03/09/2021   LABMICR 146.2 12/20/2017       ---------------------------------------------------------------------------------------------------   Past Medical History:  Diagnosis Date   Anxiety    Arthritis    shoulders, legs   Asthma    Depression    Diabetes mellitus without complication (HCC)    Hyperlipidemia    Migraine headache    2x/month   Vertigo     Past Surgical History:  Procedure Laterality Date   ABDOMINAL HYSTERECTOMY     ARTHROPLASTY     CHOLECYSTECTOMY     FEMUR FRACTURE SURGERY     TOTAL KNEE ARTHROPLASTY Left     Family History  Problem Relation Age of Onset   Diabetes Mother    COPD Mother    Diabetes Father    Coronary artery disease Father    Coronary artery disease Maternal Grandmother    Heart attack Maternal Grandmother    Diabetes Paternal Grandmother  Social History   Socioeconomic History   Marital status: Widowed    Spouse name: Not on file   Number of children: 3   Years of education: Not on file   Highest education level: Some college, no degree  Occupational History   Occupation: disability  Tobacco Use   Smoking status: Never   Smokeless tobacco: Never  Vaping Use   Vaping Use: Never used  Substance and Sexual Activity   Alcohol use: Not Currently    Comment: 1 drink every 3 months   Drug use: Never   Sexual activity: Not Currently  Other Topics Concern   Not on file   Social History Narrative   Not on file   Social Determinants of Health   Financial Resource Strain: Low Risk    Difficulty of Paying Living Expenses: Not hard at all  Food Insecurity: No Food Insecurity   Worried About Charity fundraiser in the Last Year: Never true   [place] in the Last Year: Never true  Transportation Needs: No Transportation Needs   Lack of Transportation (Medical): No   Lack of Transportation (Non-Medical): No  Physical Activity: Insufficiently Active   Days of Exercise per Week: 4 days   Minutes of Exercise per Session: 10 min  Stress: No Stress Concern Present   Feeling of Stress : Not at all  Social Connections: Socially Isolated   Frequency of Communication with Friends and Family: More than three times a week   Frequency of Social Gatherings with Friends and Family: Twice a week   Attends Religious Services: Never   Marine scientist or Organizations: No   Attends Archivist Meetings: Never   Marital Status: Widowed  Human resources officer Violence: Not At Risk   Fear of Current or Ex-Partner: No   Emotionally Abused: No   Physically Abused: No   Sexually Abused: No    Outpatient Medications Prior to Visit  Medication Sig Dispense Refill   aspirin 81 MG tablet Take by mouth daily.     aspirin-acetaminophen-caffeine (EXCEDRIN MIGRAINE) 250-250-65 MG tablet Take 1 tablet by mouth every 6 (six) hours as needed for headache.     baclofen (LIORESAL) 10 MG tablet Take 1 tablet (10 mg total) by mouth 2 (two) times daily as needed for muscle spasms. 20 each 0   blood glucose meter kit and supplies Dispense based on patient and insurance preference. Use daily as directed. 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 1 each 0   Boswellia Serrata (BOSWELLIA PO) Take 500 mg by mouth in the morning and at bedtime.     glipiZIDE (GLUCOTROL XL) 10 MG 24 hr tablet TAKE ONE TABLET BY MOUTH EVERY MORNING with breakfast 90 tablet 1   glucose blood (ONETOUCH VERIO) test  strip Use to check blood sugars once daily as instructed 100 each 12   montelukast (SINGULAIR) 10 MG tablet Take 1 tablet (10 mg total) by mouth daily. 90 tablet 1   Multiple Vitamin (MULTIVITAMIN WITH MINERALS) TABS tablet Take 1 tablet by mouth daily. Centrum Silver Women's 50+     naproxen sodium (ALEVE) 220 MG tablet Take 220 mg by mouth daily.     NON FORMULARY Take 1 capsule by mouth in the morning and at bedtime. AZO Bladder Control Supplement     OneTouch Delica Lancets 62I MISC Use to check blood sugars once daily as directed. 100 each 12   rosuvastatin (CRESTOR) 5 MG tablet Take 1 tablet (5 mg total) by mouth  daily. 90 tablet 1   umeclidinium bromide (INCRUSE ELLIPTA) 62.5 MCG/ACT AEPB Inhale 1 puff into the lungs daily. 90 each 3   albuterol (VENTOLIN HFA) 108 (90 Base) MCG/ACT inhaler INHALE 1-2 PUFFS INTO THE LUNGS EVERY 4 TO 6 HOURS AS NEEDED FOR WHEEZING OR SHORTNESS OF BREATH. 18 each 4   Dulaglutide (TRULICITY) 1.5 AT/5.5DD SOPN Inject 1.5 mg into the skin once a week. 6 mL 3   traMADol (ULTRAM) 50 MG tablet Take by mouth every 6 (six) hours as needed.     No facility-administered medications prior to visit.    Allergies  Allergen Reactions   Penicillins Swelling    ROS Review of Systems  Constitutional:  Negative for fatigue and fever.  Respiratory:  Negative for cough and shortness of breath.   Cardiovascular:  Negative for chest pain and leg swelling.  Gastrointestinal:  Negative for abdominal pain.  Musculoskeletal:  Positive for arthralgias, neck pain and neck stiffness.  Neurological:  Negative for dizziness and headaches.  Psychiatric/Behavioral:  Positive for sleep disturbance. Negative for self-injury. The patient is nervous/anxious.      Objective:    Physical Exam Constitutional:      Appearance: Normal appearance. She is not ill-appearing.  HENT:     Head: Normocephalic.  Eyes:     Conjunctiva/sclera: Conjunctivae normal.  Cardiovascular:     Rate  and Rhythm: Normal rate and regular rhythm.     Pulses:          Dorsalis pedis pulses are 3+ on the right side and 3+ on the left side.     Heart sounds: Normal heart sounds.  Pulmonary:     Effort: Pulmonary effort is normal.     Breath sounds: Normal breath sounds.  Feet:     Right foot:     Protective Sensation: 3 sites tested.  2 sites sensed.     Skin integrity: Skin integrity normal.     Toenail Condition: Right toenails are normal.     Left foot:     Protective Sensation: 3 sites tested.  2 sites sensed.     Skin integrity: Skin integrity normal.     Toenail Condition: Left toenails are normal.  Neurological:     Mental Status: She is oriented to person, place, and time.  Psychiatric:        Mood and Affect: Mood normal.        Behavior: Behavior normal.    BP (!) 165/93 (BP Location: Right Arm, Patient Position: Sitting, Cuff Size: Normal)    Pulse (!) 113    Ht _0  (1.626 m)    Wt 220 lb 4.8 oz (99.9 kg)    BMI 37.81 kg/m  Wt Readings from Last 3 Encounters:  05/02/21 220 lb 4.8 oz (99.9 kg)  03/09/21 218 lb (98.9 kg)  01/12/21 218 lb (98.9 kg)     Health Maintenance Due  Topic Date Due   COVID-19 Vaccine (1) Never done   HIV Screening  Never done   MAMMOGRAM  Never done   Zoster Vaccines- Shingrix (1 of 2) Never done   FOOT EXAM  01/07/2019   TETANUS/TDAP  07/29/2019   OPHTHALMOLOGY EXAM  04/04/2021    There are no preventive care reminders to display for this patient.  Lab Results  Component Value Date   TSH 2.510 10/14/2015   Lab Results  Component Value Date   WBC 11.4 (H) 03/09/2021   HGB 13.5 03/09/2021   HCT 39.2 03/09/2021  MCV 86.3 03/09/2021   PLT 257 03/09/2021   Lab Results  Component Value Date   NA 137 03/09/2021   K 4.3 03/09/2021   CO2 25 03/09/2021   GLUCOSE 156 (H) 03/09/2021   BUN 12 03/09/2021   CREATININE 1.05 (H) 03/09/2021   BILITOT 1.2 03/09/2021   ALKPHOS 90 03/09/2021   AST 30 03/09/2021   ALT 33 03/09/2021    PROT 7.4 03/09/2021   ALBUMIN 4.5 03/09/2021   CALCIUM 9.5 03/09/2021   ANIONGAP 8 03/09/2021   EGFR 71 01/26/2021   Lab Results  Component Value Date   CHOL 170 01/26/2021   Lab Results  Component Value Date   HDL 44 01/26/2021   Lab Results  Component Value Date   LDLCALC 82 01/26/2021   Lab Results  Component Value Date   TRIG 267 (H) 01/26/2021   Lab Results  Component Value Date   CHOLHDL 3.9 01/26/2021   Lab Results  Component Value Date   HGBA1C 13.8 (A) 01/12/2021      Assessment & Plan:   Problem List Items Addressed This Visit       Cardiovascular and Mediastinum   Hypertension associated with diabetes ([place])    Not currently medicated. Previously on 50 mg losartan, pt self d/c. Advised we restart d/t elevated pressure today.  Will return 1 mo for BP check, f/u with me 3 mo      Relevant Medications   Dulaglutide (TRULICITY) 3 XK/5.5VZ SOPN   losartan (COZAAR) 50 MG tablet   Other Relevant Orders   Basic Metabolic Panel (BMET)   CBC     Respiratory   Asthmatic bronchitis    Refilled albuteorl Continue Incruse. Advised pt to monitor the amount of times a day she is taking albuterol, if it is consistently several times a day need to think about additional inhaler      Relevant Medications   albuterol (VENTOLIN HFA) 108 (90 Base) MCG/ACT inhaler     Endocrine   Type 2 diabetes mellitus with hyperglycemia, without long-term current use of insulin (HCC) - Primary    A1c improved to 8.0% Advised to increase trulicity to 3 mg and continue glipizide 10 mg.  Continue checking fasting BS Advised numbness in great toes likely neuropathy secondary to historically uncontrolled DM F/u 3 mo .      Relevant Medications   Dulaglutide (TRULICITY) 3 SM/2.7MB SOPN   losartan (COZAAR) 50 MG tablet     Other   Bipolar depression ([place])    Referred back to psychiatrist and therapist currently unmedicated.  Encouraged her to make and keep these  appointments. Pt has protective factors in place despite transient SI        Relevant Orders   Ambulatory referral to Psychology   Ambulatory referral to Psychiatry   Chronic 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    Typically manages with alleve, tylenol.  Takes tramadol very sporadically--last fill of 30 was 04/2020. Will refill for times of severe pain.       Relevant Medications   traMADol (ULTRAM) 50 MG tablet   Other Visit Diagnoses     Right cervical radiculopathy       Relevant Medications   traMADol (ULTRAM) 50 MG tablet   Need for influenza vaccination       Relevant Orders   Flu Vaccine QUAD 6+ mos PF IM (Fluarix Quad PF) (Completed)   Need for vaccine for Td (tetanus-diphtheria)  Relevant Medications   tetanus & diphtheria toxoids, adult, (TENIVAC) 5-2 LFU injection   Need for shingles vaccine       Relevant Medications   Zoster Vaccine Adjuvanted [facility]) injection        Follow-up: Return in about 3 months (around 08/02/2021) for DMII.    I, Joanna Kirschner, PA-C have reviewed all documentation for this visit. The documentation on  05/02/2021  for the exam, diagnosis, procedures, and orders are all accurate and complete.  Joanna Kirschner, PA-C [facility] [address] [place], [place], [address] Office: [phone] Fax: [phone]

## 2021-05-02 NOTE — Assessment & Plan Note (Signed)
Not currently medicated. Previously on 50 mg losartan, pt self d/c. ?Advised we restart d/t elevated pressure today.  ?Will return 1 mo for BP check, f/u with me 3 mo ?

## 2021-05-02 NOTE — Assessment & Plan Note (Signed)
Typically manages with alleve, tylenol.  ?Takes tramadol very sporadically--last fill of 30 was 04/2020. ?Will refill for times of severe pain.  ?

## 2021-05-02 NOTE — Assessment & Plan Note (Addendum)
Refilled albuteorl ?Continue Incruse. ?Advised pt to monitor the amount of times a day she is taking albuterol, if it is consistently several times a day need to think about additional inhaler ?

## 2021-05-02 NOTE — Assessment & Plan Note (Addendum)
A1c improved to 8.0% ?Advised to increase trulicity to 3 mg and continue glipizide 10 mg.  ?Continue checking fasting BS ?Advised numbness in great toes likely neuropathy secondary to historically uncontrolled DM ?F/u 3 mo . ?

## 2021-05-02 NOTE — Assessment & Plan Note (Signed)
Referred back to psychiatrist and therapist currently unmedicated.  ?Encouraged her to make and keep these appointments. ?Pt has protective factors in place despite transient SI ? ? ?

## 2021-05-03 ENCOUNTER — Telehealth: Payer: Self-pay

## 2021-05-03 DIAGNOSIS — I152 Hypertension secondary to endocrine disorders: Secondary | ICD-10-CM

## 2021-05-03 LAB — CBC
Hematocrit: 41.1 % (ref 34.0–46.6)
Hemoglobin: 13.8 g/dL (ref 11.1–15.9)
MCH: 29.1 pg (ref 26.6–33.0)
MCHC: 33.6 g/dL (ref 31.5–35.7)
MCV: 87 fL (ref 79–97)
Platelets: 216 10*3/uL (ref 150–450)
RBC: 4.75 x10E6/uL (ref 3.77–5.28)
RDW: 13.3 % (ref 11.7–15.4)
WBC: 11.8 10*3/uL — ABNORMAL HIGH (ref 3.4–10.8)

## 2021-05-03 LAB — BASIC METABOLIC PANEL
BUN/Creatinine Ratio: 22 (ref 12–28)
BUN: 24 mg/dL (ref 8–27)
CO2: 22 mmol/L (ref 20–29)
Calcium: 10.1 mg/dL (ref 8.7–10.3)
Chloride: 101 mmol/L (ref 96–106)
Creatinine, Ser: 1.09 mg/dL — ABNORMAL HIGH (ref 0.57–1.00)
Glucose: 304 mg/dL — ABNORMAL HIGH (ref 70–99)
Potassium: 4.4 mmol/L (ref 3.5–5.2)
Sodium: 138 mmol/L (ref 134–144)
eGFR: 57 mL/min/{1.73_m2} — ABNORMAL LOW (ref >59)

## 2021-05-03 NOTE — Telephone Encounter (Signed)
-----   Message from Alfredia Ferguson, PA-C sent at 05/03/2021 11:19 AM EST ----- ?Needs cbc and bmp in 3 weeks, bw only ?

## 2021-05-05 ENCOUNTER — Other Ambulatory Visit: Payer: Self-pay

## 2021-05-05 ENCOUNTER — Encounter: Payer: Self-pay | Admitting: Physician Assistant

## 2021-05-05 ENCOUNTER — Ambulatory Visit: Payer: Self-pay | Admitting: *Deleted

## 2021-05-05 ENCOUNTER — Telehealth (INDEPENDENT_AMBULATORY_CARE_PROVIDER_SITE_OTHER): Payer: Medicare HMO | Admitting: Physician Assistant

## 2021-05-05 DIAGNOSIS — R112 Nausea with vomiting, unspecified: Secondary | ICD-10-CM

## 2021-05-05 DIAGNOSIS — K921 Melena: Secondary | ICD-10-CM | POA: Diagnosis not present

## 2021-05-05 DIAGNOSIS — Z79899 Other long term (current) drug therapy: Secondary | ICD-10-CM | POA: Diagnosis not present

## 2021-05-05 DIAGNOSIS — Z7982 Long term (current) use of aspirin: Secondary | ICD-10-CM | POA: Diagnosis not present

## 2021-05-05 DIAGNOSIS — Z7901 Long term (current) use of anticoagulants: Secondary | ICD-10-CM | POA: Diagnosis not present

## 2021-05-05 DIAGNOSIS — Z7984 Long term (current) use of oral hypoglycemic drugs: Secondary | ICD-10-CM | POA: Diagnosis not present

## 2021-05-05 DIAGNOSIS — K254 Chronic or unspecified gastric ulcer with hemorrhage: Secondary | ICD-10-CM | POA: Diagnosis not present

## 2021-05-05 DIAGNOSIS — E1159 Type 2 diabetes mellitus with other circulatory complications: Secondary | ICD-10-CM | POA: Diagnosis not present

## 2021-05-05 DIAGNOSIS — R000 Tachycardia, unspecified: Secondary | ICD-10-CM | POA: Diagnosis not present

## 2021-05-05 DIAGNOSIS — I152 Hypertension secondary to endocrine disorders: Secondary | ICD-10-CM | POA: Diagnosis not present

## 2021-05-05 DIAGNOSIS — T39315A Adverse effect of propionic acid derivatives, initial encounter: Secondary | ICD-10-CM | POA: Diagnosis not present

## 2021-05-05 DIAGNOSIS — R1031 Right lower quadrant pain: Secondary | ICD-10-CM | POA: Diagnosis not present

## 2021-05-05 DIAGNOSIS — M25569 Pain in unspecified knee: Secondary | ICD-10-CM | POA: Diagnosis not present

## 2021-05-05 DIAGNOSIS — Z9049 Acquired absence of other specified parts of digestive tract: Secondary | ICD-10-CM | POA: Diagnosis not present

## 2021-05-05 DIAGNOSIS — R197 Diarrhea, unspecified: Secondary | ICD-10-CM

## 2021-05-05 DIAGNOSIS — E785 Hyperlipidemia, unspecified: Secondary | ICD-10-CM | POA: Diagnosis not present

## 2021-05-05 DIAGNOSIS — K760 Fatty (change of) liver, not elsewhere classified: Secondary | ICD-10-CM | POA: Diagnosis not present

## 2021-05-05 DIAGNOSIS — Z8719 Personal history of other diseases of the digestive system: Secondary | ICD-10-CM | POA: Diagnosis not present

## 2021-05-05 DIAGNOSIS — M549 Dorsalgia, unspecified: Secondary | ICD-10-CM | POA: Diagnosis not present

## 2021-05-05 DIAGNOSIS — D62 Acute posthemorrhagic anemia: Secondary | ICD-10-CM | POA: Diagnosis not present

## 2021-05-05 DIAGNOSIS — E119 Type 2 diabetes mellitus without complications: Secondary | ICD-10-CM | POA: Diagnosis not present

## 2021-05-05 DIAGNOSIS — K2289 Other specified disease of esophagus: Secondary | ICD-10-CM | POA: Diagnosis not present

## 2021-05-05 DIAGNOSIS — Z880 Allergy status to penicillin: Secondary | ICD-10-CM | POA: Diagnosis not present

## 2021-05-05 DIAGNOSIS — K920 Hematemesis: Secondary | ICD-10-CM | POA: Diagnosis not present

## 2021-05-05 DIAGNOSIS — I10 Essential (primary) hypertension: Secondary | ICD-10-CM | POA: Diagnosis not present

## 2021-05-05 DIAGNOSIS — E669 Obesity, unspecified: Secondary | ICD-10-CM | POA: Diagnosis not present

## 2021-05-05 DIAGNOSIS — E1165 Type 2 diabetes mellitus with hyperglycemia: Secondary | ICD-10-CM | POA: Diagnosis not present

## 2021-05-05 DIAGNOSIS — G8929 Other chronic pain: Secondary | ICD-10-CM | POA: Diagnosis not present

## 2021-05-05 DIAGNOSIS — K209 Esophagitis, unspecified without bleeding: Secondary | ICD-10-CM | POA: Diagnosis not present

## 2021-05-05 DIAGNOSIS — R638 Other symptoms and signs concerning food and fluid intake: Secondary | ICD-10-CM | POA: Diagnosis not present

## 2021-05-05 DIAGNOSIS — J45909 Unspecified asthma, uncomplicated: Secondary | ICD-10-CM | POA: Diagnosis not present

## 2021-05-05 DIAGNOSIS — K2950 Unspecified chronic gastritis without bleeding: Secondary | ICD-10-CM | POA: Diagnosis not present

## 2021-05-05 DIAGNOSIS — R1010 Upper abdominal pain, unspecified: Secondary | ICD-10-CM | POA: Diagnosis not present

## 2021-05-05 DIAGNOSIS — Z791 Long term (current) use of non-steroidal anti-inflammatories (NSAID): Secondary | ICD-10-CM | POA: Diagnosis not present

## 2021-05-05 DIAGNOSIS — T391X5A Adverse effect of 4-Aminophenol derivatives, initial encounter: Secondary | ICD-10-CM | POA: Diagnosis not present

## 2021-05-05 DIAGNOSIS — K922 Gastrointestinal hemorrhage, unspecified: Secondary | ICD-10-CM | POA: Diagnosis not present

## 2021-05-05 DIAGNOSIS — R7989 Other specified abnormal findings of blood chemistry: Secondary | ICD-10-CM | POA: Diagnosis not present

## 2021-05-05 DIAGNOSIS — Z20822 Contact with and (suspected) exposure to covid-19: Secondary | ICD-10-CM | POA: Diagnosis not present

## 2021-05-05 DIAGNOSIS — J4540 Moderate persistent asthma, uncomplicated: Secondary | ICD-10-CM | POA: Diagnosis not present

## 2021-05-05 NOTE — Progress Notes (Signed)
I,Joseline E Rosas,acting as a scribe for [employer], PA-C.,have documented all relevant documentation on the behalf of [place], PA-C,as directed by  [employer], PA-C while in the presence of Atiba Kimberlin E Takiesha Mcdevitt, PA-C.   MyChart Video Visit    Virtual Visit via Video Note   This visit type was conducted due to national recommendations for restrictions regarding the COVID-19 Pandemic (e.g. social distancing) in an effort to limit this patient's exposure and mitigate transmission in our community. This patient is at least at moderate risk for complications without adequate follow up. This format is felt to be most appropriate for this patient at this time. Physical exam was limited by quality of the video and audio technology used for the visit.   Patient location: at home in [place],  [place] Provider location: [facility], [place], [place]  I discussed the limitations of evaluation and management by telemedicine and the availability of in person appointments. The patient expressed understanding and agreed to proceed.  Patient: Joanna Bishop   DOB: 08-22-1955   66 y.o. Female  MRN: [id number] Visit Date: 05/05/2021  Today's healthcare provider: Dani Gobble Bunny Lowdermilk, PA-C  Introduced myself to the patient as a Journalist, newspaper and provided education on APPs in clinical practice.    CC: nausea, vomiting, diarrhea with dizziness after taking Losartan last night.   Subjective    HPI  Patient with concerns of allergic reaction to Losartan. Reports that yesterday was her first time at night to take it. Reports that she woke up at 11 pm with cold sweats. This morning she reports that she woke up feeling lethargic and dizzy, vomiting.After vomiting reports that she feels better now. Reports that now she has diarrhea and vomit 5 minutes ago before I called   States she started her Losartan last night  Woke up with cold sweats with SOB - resolved with her albuterol inhaler This AM feels like her  blood sugar had dropped- felt dizzy, weak, drank a glass of OJ and felt better Then she vomited and seemed to feel a bit better after that States she had diarrhea and vomited again after speaking to nurse. States vomit and diarrhea were black in color. Denies coffee ground appearance of vomit.  She denies taking pepto bismol recently or being on iron supplementation at this time.  Denies tingling or swelling of tongue or lips  States she is taking aspirin daily, has taken Tramadol once last Saturday. She takes Excedrin every 2-3 days for pain.  She is not able to check her vitals at this time - stated her DIL will be coming over soon to check on her and take vitals    Medications: Outpatient Medications Prior to Visit  Medication Sig   albuterol (VENTOLIN HFA) 108 (90 Base) MCG/ACT inhaler INHALE 1-2 PUFFS INTO THE LUNGS EVERY 4 TO 6 HOURS AS NEEDED FOR WHEEZING OR SHORTNESS OF BREATH.   aspirin 81 MG tablet Take by mouth daily.   aspirin-acetaminophen-caffeine (EXCEDRIN MIGRAINE) 250-250-65 MG tablet Take 1 tablet by mouth every 6 (six) hours as needed for headache.   baclofen (LIORESAL) 10 MG tablet Take 1 tablet (10 mg total) by mouth 2 (two) times daily as needed for muscle spasms.   blood glucose meter kit and supplies Dispense based on patient and insurance preference. Use daily as directed. 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   Boswellia Serrata (BOSWELLIA PO) Take 500 mg by mouth in the morning and at bedtime.   Dulaglutide (TRULICITY) 3  MG/0.5ML SOPN Inject 3 mg as directed once a week.   glipiZIDE (GLUCOTROL XL) 10 MG 24 hr tablet TAKE ONE TABLET BY MOUTH EVERY MORNING with breakfast   glucose blood (ONETOUCH VERIO) test strip Use to check blood sugars once daily as instructed   losartan (COZAAR) 50 MG tablet Take 1 tablet (50 mg total) by mouth daily.   montelukast (SINGULAIR) 10 MG tablet Take 1 tablet (10 mg total) by mouth daily.   Multiple Vitamin (MULTIVITAMIN WITH MINERALS) TABS tablet Take  1 tablet by mouth daily. Centrum Silver Women's 50+   NON FORMULARY Take 1 capsule by mouth in the morning and at bedtime. AZO Bladder Control Supplement   OneTouch Delica Lancets 37S MISC Use to check blood sugars once daily as directed.   rosuvastatin (CRESTOR) 5 MG tablet Take 1 tablet (5 mg total) by mouth daily.   traMADol (ULTRAM) 50 MG tablet Take 1 tablet (50 mg total) by mouth every 6 (six) hours as needed.   umeclidinium bromide (INCRUSE ELLIPTA) 62.5 MCG/ACT AEPB Inhale 1 puff into the lungs daily.   naproxen sodium (ALEVE) 220 MG tablet Take 220 mg by mouth daily. (Patient not taking: Reported on 05/05/2021)   No facility-administered medications prior to visit.    Review of Systems  Respiratory:  Negative for shortness of breath.   Cardiovascular:  Negative for chest pain and palpitations.  Gastrointestinal:  Positive for diarrhea, nausea and vomiting. Negative for abdominal pain, anal bleeding and blood in stool.  Neurological:  Positive for dizziness, weakness and light-headedness.     Objective    There were no vitals taken for this visit.   Physical Exam Constitutional:      Appearance: Normal appearance.  HENT:     Head: Normocephalic and atraumatic.     Mouth/Throat:     Lips: Pink. No lesions.     Mouth: No angioedema.     Tongue: No lesions. Tongue does not deviate from midline.     Comments: Lips and tongue are normal in appearance during visit  Eyes:     Extraocular Movements: Extraocular movements intact.     Conjunctiva/sclera: Conjunctivae normal.  Pulmonary:     Effort: Pulmonary effort is normal.     Comments: Able to fully engage in conversation without appearing short of breath or signs of struggle  Neurological:     Mental Status: She is alert.       Assessment & Plan     Problem List Items Addressed This Visit       Cardiovascular and Mediastinum   Hypertension associated with diabetes ([place])    Historic concern  Recently restarted  on Losartan 50 mg PO QHS for BP control Reports starting it last night and waking up after several hours with dizziness and cold sweats which progressed to nausea, vomiting, diarrhea Suspect this could be secondary to hypotension but unable to confirm with vitals from patient Recommend she try taking half of Losartan tablet for a few days until she is feeling better.  Recommend monitoring BP and increasing hydration efforts at this time Discussed signs of allergic reaction to losartan and ED precautions should this occur.        Other Visit Diagnoses     Nausea, vomiting and diarrhea    -  Primary Acute, new problem Started last night after she took Losartan for the first time in some time Reports this started with dizziness and cold sweats before progressing to nausea, vomiting, diarrhea Suspect this  could have several potential etiologies: hypotension, gastroenteritis from infectious cause, allergic reaction to Lorsartan, GI bleed I discussed these various etiologies with the patient and provided instructions for return and ED precautions Recommend she stay well hydrated with water and interspersed pedialyte, consume bland diet to assist with nausea and vomiting, and reduce Losartan to half dose until she feels better in case this is BP related.  She reported that vomit and diarrhea was black in color, denies coffee ground emesis. This raises my concern for acute GI bleed especially in context of her current medication use. We discussed this and if this continues I recommend she go to ED for evaluation and management as needed.          No follow-ups on file.   I, Larah Kuntzman E Ettel Albergo, PA-C, have reviewed all documentation for this visit. The documentation on 05/05/21 for the exam, diagnosis, procedures, and orders are all accurate and complete.   Adalea Handler, Glennie Isle MPH [facility]   No follow-ups on file.     I discussed the assessment and  treatment plan with the patient. The patient was provided an opportunity to ask questions and all were answered. The patient agreed with the plan and demonstrated an understanding of the instructions.   The patient was advised to call back or seek an in-person evaluation if the symptoms worsen or if the condition fails to improve as anticipated.  I provided 16 minutes of non-face-to-face time during this encounter.    Almon Register, PA-C [employer] [phone] (phone) [phone] (fax)  [place]

## 2021-05-05 NOTE — Telephone Encounter (Signed)
? ?  Chief Complaint: medication reaction  ?Symptoms: vomit, dizzy, cold sweats ?Frequency: once, after vomiting felt ok ?Pertinent Negatives: Patient denies fever ?Disposition: [] ED /[] Urgent Care (no appt availability in office) / [] Appointment(In office/virtual)/ []  [facility]/ [] Home Care/ [] Refused Recommended Disposition /[] [facility]/ [x]  Follow-up with PCP ?Additional Notes: There is not a vertual visit with anyone in the practice until 05/08/21. She does not feel safe to drive, she is symptom free this morning. I will give information to [name], PA and asked pt to call back if she has not heard for the office by early afternoon, or if any symptoms reoccur.  ?Reason for Disposition ? [1] Caller has URGENT medicine question about med that PCP or specialist prescribed AND [2] triager unable to answer question ? ?Answer Assessment - Initial Assessment Questions ?1. NAME of MEDICATION: "What medicine are you calling about?" ?    Losartan ?2. QUESTION: "What is your question?" (e.g., double dose of medicine, side effect) ?    Reaction dizzy, vomited,sweats ?3. PRESCRIBING HCP: "Who prescribed it?" Reason: if prescribed by specialist, call should be referred to that group. ?    [facility] PA ?4. SYMPTOMS: "Do you have any symptoms?" ?    Dizzy, vomiting, feels better now ?5. SEVERITY: If symptoms are present, ask "Are they mild, moderate or severe?" ?    Severe at the time. ?6. PREGNANCY:  "Is there any chance that you are pregnant?" "When was your last menstrual period?" ?    na ? ?Protocols used: Medication Question Call-A-AH ? ?

## 2021-05-05 NOTE — Telephone Encounter (Signed)
Patient scheduled for today with Erin. ?

## 2021-05-05 NOTE — Assessment & Plan Note (Signed)
Historic concern  ?Recently restarted on Losartan 50 mg PO QHS for BP control ?Reports starting it last night and waking up after several hours with dizziness and cold sweats which progressed to nausea, vomiting, diarrhea ?Suspect this could be secondary to hypotension but unable to confirm with vitals from patient ?Recommend she try taking half of Losartan tablet for a few days until she is feeling better.  ?Recommend monitoring BP and increasing hydration efforts at this time ?Discussed signs of allergic reaction to losartan and ED precautions should this occur.  ? ?

## 2021-05-05 NOTE — Patient Instructions (Addendum)
Cut the Losartan in half to help prevent low BP until you are feeling more like yourself ? ?Stay well hydrated with plenty of water, you can use pedialyte as well to replenish lost electrolytes  ?Consume a bland diet for the next few days -bananas, applesauce, boiled chicken, toast, etc- to further assist with nausea and vomiting ? ?If you start to have trouble breathing, swelling under your chin or swelling of your tongue or lips please call EMS as this is a sign of an allergic reaction ? ?If your black stools and vomiting continue please go to the ED as that is concerning for a GI bleed and will need to be monitored and managed with more intensive therapy.  ? ? ? ? ?

## 2021-05-08 DIAGNOSIS — E1165 Type 2 diabetes mellitus with hyperglycemia: Secondary | ICD-10-CM | POA: Diagnosis not present

## 2021-05-08 DIAGNOSIS — K254 Chronic or unspecified gastric ulcer with hemorrhage: Secondary | ICD-10-CM | POA: Diagnosis not present

## 2021-05-08 DIAGNOSIS — K2289 Other specified disease of esophagus: Secondary | ICD-10-CM | POA: Diagnosis not present

## 2021-05-08 DIAGNOSIS — R638 Other symptoms and signs concerning food and fluid intake: Secondary | ICD-10-CM | POA: Diagnosis not present

## 2021-05-08 DIAGNOSIS — E669 Obesity, unspecified: Secondary | ICD-10-CM | POA: Diagnosis not present

## 2021-05-08 DIAGNOSIS — Z7984 Long term (current) use of oral hypoglycemic drugs: Secondary | ICD-10-CM | POA: Diagnosis not present

## 2021-05-08 DIAGNOSIS — K922 Gastrointestinal hemorrhage, unspecified: Secondary | ICD-10-CM | POA: Diagnosis not present

## 2021-05-08 DIAGNOSIS — K921 Melena: Secondary | ICD-10-CM | POA: Diagnosis not present

## 2021-05-09 ENCOUNTER — Telehealth: Admitting: Physician Assistant

## 2021-05-11 ENCOUNTER — Telehealth: Payer: Self-pay

## 2021-05-11 DIAGNOSIS — M5412 Radiculopathy, cervical region: Secondary | ICD-10-CM | POA: Diagnosis not present

## 2021-05-11 NOTE — Progress Notes (Signed)
? ? ?Chronic Care Management ?Pharmacy Assistant  ? ?Name: Joanna Bishop  MRN: [id number] DOB: Dec 20, 1955 ? ?Reason for Encounter: Medication Review/Medication Coordination Call ?  ?Recent office visits:  ?04/26/2021 Talitha Givens, PA-C (PCP Video Visit) for Nausea, Vomiting and Diarrhea- Stopped: Naproxen Sodium 220 mg, No orders placed, No Follow-up noted ? ?05/02/2021 [name], PA-C (PCP Office Visit) for Type II DM- Started: Losartan Potassium 50 mg daily, Changed: Dulaglutide 1.5 mg weekly to 3 mg weekly, lab order placed, Referral to Psychology, and Psychiatry, Patient to follow-up in 3 months ? ?Recent consult visits:  ?04/12/2021 Earnestine Leys, MD ([facility]) for Neck problem/Bilateral Hand Problem- Started: Prednisone dose pack 10 mg 28 tablets, Robaxin 500 mg bid, No orders placed, Patient to follow-up in 2 weeks ? ?Hospital visits:  ?Medication Reconciliation was completed by comparing discharge summary, patient?s EMR and Pharmacy list, and upon discussion with patient. ? ?Admitted to the hospital on 05/05/2021 due to Acute Upper GI Bleed. Discharge date was 05/08/2021. Discharged from [facility].   ? ?New?Medications Started at [facility] Discharge:?? ?-Started  ?Pantoprazole 40 mg twice daily for eight weeks ? ?Medication Changes at Hospital Discharge: ?-Changed None ID ? ?Medications Discontinued at Hospital Discharge: ?-Stopped  ?NSAID and ASA due to Ulcer ? ?Medications that remain the same after Hospital Discharge:??  ?-All other medications will remain the same.   ? ?Medications: ?Outpatient Encounter Medications as of 05/11/2021  ?Medication Sig Note  ? albuterol (VENTOLIN HFA) 108 (90 Base) MCG/ACT inhaler INHALE 1-2 PUFFS INTO THE LUNGS EVERY 4 TO 6 HOURS AS NEEDED FOR WHEEZING OR SHORTNESS OF BREATH.   ? aspirin 81 MG tablet Take by mouth daily. 08/02/2014: Received from: [place]:   ? aspirin-acetaminophen-caffeine (EXCEDRIN MIGRAINE) 250-250-65 MG  tablet Take 1 tablet by mouth every 6 (six) hours as needed for headache.   ? baclofen (LIORESAL) 10 MG tablet Take 1 tablet (10 mg total) by mouth 2 (two) times daily as needed for muscle spasms.   ? blood glucose meter kit and supplies Dispense based on patient and insurance preference. Use daily as directed. 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   ? Boswellia Serrata (BOSWELLIA PO) Take 500 mg by mouth in the morning and at bedtime.   ? Dulaglutide (TRULICITY) 3 WU/9.8JX SOPN Inject 3 mg as directed once a week.   ? glipiZIDE (GLUCOTROL XL) 10 MG 24 hr tablet TAKE ONE TABLET BY MOUTH EVERY MORNING with breakfast   ? glucose blood (ONETOUCH VERIO) test strip Use to check blood sugars once daily as instructed   ? losartan (COZAAR) 50 MG tablet Take 1 tablet (50 mg total) by mouth daily.   ? montelukast (SINGULAIR) 10 MG tablet Take 1 tablet (10 mg total) by mouth daily.   ? Multiple Vitamin (MULTIVITAMIN WITH MINERALS) TABS tablet Take 1 tablet by mouth daily. Centrum Silver Women's 50+   ? NON FORMULARY Take 1 capsule by mouth in the morning and at bedtime. AZO Bladder Control Supplement   ? OneTouch Delica Lancets 91Y MISC Use to check blood sugars once daily as directed.   ? rosuvastatin (CRESTOR) 5 MG tablet Take 1 tablet (5 mg total) by mouth daily.   ? traMADol (ULTRAM) 50 MG tablet Take 1 tablet (50 mg total) by mouth every 6 (six) hours as needed.   ? umeclidinium bromide (INCRUSE ELLIPTA) 62.5 MCG/ACT AEPB Inhale 1 puff into the lungs daily.   ? [DISCONTINUED] XARELTO 10 MG TABS tablet Take 10 mg by  mouth every morning.   ? ?No facility-administered encounter medications on file as of 05/11/2021.  ? ?Care Gaps: ?COVID-19 Vaccine ?HIV Screening ?Zoster Vaccines ?Diabetic Foot Exam ?Tetanus/TDAP ?Influenza Vaccine ?Diabetic Eye Exam ?Mammogram ? ?Star Rating Drugs: ?Losartan 50 mg last filled on 05/04/2021 for a 19-Day Supply with [facility] ?Glipizide 10 mg last filled on 04/19/2021 for a 30-Day Supply with [facility] ?Rosuvastatin 5 mg last filled on 04/17/2021 for a 30-Day supply with [facility] ?Trulicity 3 mg patient receives this medication from the [place] so not sure when the last fill date was ? ?BP Readings from Last 3 Encounters:  ?05/02/21 (!) 165/93  ?03/09/21 (!) 164/110  ?01/12/21 136/88  ?  ?Lab Results  ?Component Value Date  ? HGBA1C 13.8 (A) 01/12/2021  ?      HGBA1C          7.9                                   05/07/2021  ? ?Patient obtains medications through V[facility]  30 Days  ? ?Last adherence delivery included:  ?Rosuvastatin 5 mg 1 tablet daily (Breakfast) ?Glipizide 10 mg 1 tablet daily (Breakfast) ?Montelukast 10 mg 1 tablet daily (Bedtime) ?Oxybutynin 10 mg 1 tablet daily (Bedtime) ?OneTouch Verio Test Strips ?Oncetouch Lancets ? ?Patient declined medications last month: ?Albuterol 108 Inhaler inhale 1-2 puffs every every 4-6 hours prn for wheezing or shortness of breath-PRN Medication ?Trulicity Inject 1.5 mg into the skin once a week- Patient is getting this medication through [facility] ?Increase Ellipta 62.5 mcg Inhale 1 puff into lungs daily-Patient is getting this medication through [facility] ? ?Patient is due for next adherence delivery on: 05/22/2021 (Monday) [facility] between 11 am - 3 pm. ? ?Called patient and reviewed medications and coordinated delivery. ? ?This delivery to include: ?OneTouch Verio Test Strips ?Losartan 50 mg 1 tablet daily (Breakfast) ?Albuterol 108 Inhaler inhale 1-2 puffs every every 4-6 hours prn for wheezing or shortness of breath ?Glipizide 10 mg 1 tablet daily (Breakfast) ?Montelukast 10 mg 1 tablet daily (Bedtime) ?Rosuvastatin 5 mg 1 tablet daily (Breakfast) ? ?Patient declined the following medications: ?Oxybutynin 10 mg 1 tablet daily (Bedtime)-Patient stated she does not take this medication ? ?Patient needs refills for: ?No Refills Need at this time ? ?Confirmed delivery date of 05/22/2021 [address], advised patient that pharmacy will contact them the  morning of delivery.  ? ?The patient reports that today she is feeling much better. Patient was admitted to the hospital on 05/05/2021 for Gastrointestinal Hemorrhage and had to have 4 pints of blood given to her. She stated today she is still a little weak but her symptoms are improving. Patient denies any nausea, vomiting today. Patient has a hospital follow-up tomorrow with PCP. ? ?Patient stated that she does not take the Oxybutynin but does the Azo instead since it's more natural. Patient also stated that her Trulicity was increased to 3 mg, but she has not received the new dosage in the mail. The patient does get this medication via [facility] vs [facility]. I informed her that I would give them a call to make sure the received the prescription, and to see when it will be mailed out to her. Patient has no other concerns or issues at this time. ? ?I spoke with the representative for [facility] and she confirmed receipt of the patient's Trulicity. The representative advised they were  sending it to their warehouse to be processed for shipping. Patient updated.   ? ?Patient has a telephone appointment with Junius Argyle, CPP on 05/15/2021 @ 1100 ? ?Lynann Bologna, CPA/CMA ?Clinical Pharmacist Assistant ?Phone: [phone]  ? ?

## 2021-05-12 ENCOUNTER — Encounter: Payer: Self-pay | Admitting: Physician Assistant

## 2021-05-12 ENCOUNTER — Ambulatory Visit (INDEPENDENT_AMBULATORY_CARE_PROVIDER_SITE_OTHER): Payer: Medicare HMO | Admitting: Physician Assistant

## 2021-05-12 ENCOUNTER — Other Ambulatory Visit: Payer: Self-pay

## 2021-05-12 VITALS — BP 138/83 | HR 80 | Wt 214.0 lb

## 2021-05-12 DIAGNOSIS — K274 Chronic or unspecified peptic ulcer, site unspecified, with hemorrhage: Secondary | ICD-10-CM

## 2021-05-12 DIAGNOSIS — D62 Acute posthemorrhagic anemia: Secondary | ICD-10-CM

## 2021-05-12 NOTE — Assessment & Plan Note (Signed)
Will check CBC next week.  ? ?

## 2021-05-12 NOTE — Progress Notes (Signed)
?  ?I,Jana Robinson,acting as a scribe for [employer], PA-C.,have documented all relevant documentation on the behalf of [name], PA-C,as directed by  [name], PA-C while in the presence of [name], PA-C.  ? ? ?Established patient visit ? ? ?Patient: Joanna Bishop   DOB: Dec 13, 1955   66 y.o. Female  MRN: [id number] ?Visit Date: 05/12/2021 ? ?Today's healthcare provider: [name], PA-C  ? ?Chief Complaint  ?Patient presents with  ? Hospitalization Follow-up  ? ?Subjective  ?  ?HPI  ? ?Follow up Hospitalization ? ?Patient was admitted to [facility] on 05-05-21 and discharged on 05-08-21. ?She was treated for gastrointestinal hemorrhage. ?Treatment for this included: discontinue taking aspirin 81, and excedrin migraine. Start taking Protonics 28m. Received CT scan and endoscopy. Restarted on losartan. Received four units of blood. ?She reports good compliance with treatment. ?She reports this condition is better..."just weak." ? ?----------------------------------------------------------------------------------------- -  ? ?Medications: ?Outpatient Medications Prior to Visit  ?Medication Sig  ? albuterol (VENTOLIN HFA) 108 (90 Base) MCG/ACT inhaler INHALE 1-2 PUFFS INTO THE LUNGS EVERY 4 TO 6 HOURS AS NEEDED FOR WHEEZING OR SHORTNESS OF BREATH.  ? baclofen (LIORESAL) 10 MG tablet Take 1 tablet (10 mg total) by mouth 2 (two) times daily as needed for muscle spasms.  ? blood glucose meter kit and supplies Dispense based on patient and insurance preference. Use daily as directed. 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  ? Boswellia Serrata (BOSWELLIA PO) Take 500 mg by mouth in the morning and at bedtime.  ? Dulaglutide (TRULICITY) 3 MSW/1.0XNSOPN Inject 3 mg as directed once a week.  ? glipiZIDE (GLUCOTROL XL) 10 MG 24 hr tablet TAKE ONE TABLET BY MOUTH EVERY MORNING with breakfast  ? glucose blood (ONETOUCH VERIO) test strip Use to check blood sugars once daily as instructed  ? losartan (COZAAR) 50 MG tablet Take 1  tablet (50 mg total) by mouth daily.  ? montelukast (SINGULAIR) 10 MG tablet Take 1 tablet (10 mg total) by mouth daily.  ? Multiple Vitamin (MULTIVITAMIN WITH MINERALS) TABS tablet Take 1 tablet by mouth daily. Centrum Silver Women's 50+  ? NON FORMULARY Take 1 capsule by mouth in the morning and at bedtime. AZO Bladder Control Supplement  ? OneTouch Delica Lancets 323FMISC Use to check blood sugars once daily as directed.  ? rosuvastatin (CRESTOR) 5 MG tablet Take 1 tablet (5 mg total) by mouth daily.  ? traMADol (ULTRAM) 50 MG tablet Take 1 tablet (50 mg total) by mouth every 6 (six) hours as needed.  ? umeclidinium bromide (INCRUSE ELLIPTA) 62.5 MCG/ACT AEPB Inhale 1 puff into the lungs daily.  ? pantoprazole (PROTONIX) 40 MG tablet Take 40 mg by mouth 2 (two) times daily.  ? [DISCONTINUED] aspirin 81 MG tablet Take by mouth daily. (Patient not taking: Reported on 05/12/2021)  ? [DISCONTINUED] aspirin-acetaminophen-caffeine (EXCEDRIN MIGRAINE) 250-250-65 MG tablet Take 1 tablet by mouth every 6 (six) hours as needed for headache. (Patient not taking: Reported on 05/12/2021)  ? ?No facility-administered medications prior to visit.  ? ? ?Review of Systems  ?Constitutional:  Negative for fatigue and fever.  ?Respiratory:  Negative for cough and shortness of breath.   ?Cardiovascular:  Negative for chest pain and leg swelling.  ?Gastrointestinal:  Negative for abdominal pain.  ?Neurological:  Positive for weakness. Negative for dizziness and headaches.  ? ? ?  Objective  ?  ?BP 138/83 (BP Location: Right Arm, Patient Position: Sitting, Cuff Size: Normal)   Pulse 80   Wt  214 lb (97.1 kg)   SpO2 97%   BMI 36.73 kg/m?  ? ? ?Physical Exam ?Constitutional:   ?   General: She is awake.  ?   Appearance: She is well-developed.  ?HENT:  ?   Head: Normocephalic.  ?Eyes:  ?   Conjunctiva/sclera: Conjunctivae normal.  ?Cardiovascular:  ?   Rate and Rhythm: Normal rate and regular rhythm.  ?   Heart sounds: Normal heart  sounds.  ?Pulmonary:  ?   Effort: Pulmonary effort is normal.  ?   Breath sounds: Normal breath sounds.  ?Skin: ?   General: Skin is warm.  ?Neurological:  ?   Mental Status: She is alert and oriented to person, place, and time.  ?Psychiatric:     ?   Attention and Perception: Attention normal.     ?   Mood and Affect: Mood normal.     ?   Speech: Speech normal.     ?   Behavior: Behavior is cooperative.  ?  ? ?No results found for any visits on 05/12/21. ? Assessment & Plan  ?  ? ?Problem List Items Addressed This Visit   ? ?  ? Digestive  ? 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  Continue pantoprazole 40 mg daily in AM. ?F/u with GI in 8 weeks for repeat endoscopy. ?Avoid nsaids--alleve, advil, excedrin, aspirin.  ?Any signs of black stool or vomit again please go to ED. ?  ?  ?  ? Other  ? Anemia due to acute blood loss  ?  Will check CBC next week.  ? ?  ?  ? Relevant Orders  ? CBC  ? --pt is due for colonoscopy, in chart cologuard is labeled as colonoscopy -- last cologuard was 2018. Advised she discuss with GI ? ?F/u 07/2021 for DMII ?   ?I, [name], PA-C have reviewed all documentation for this visit. The documentation on  05/12/2021 for the exam, diagnosis, procedures, and orders are all accurate and complete. ? ?[name], PA-C ?[facility] ?[id number] ?[place], [place], [address] ?Office: [phone] ?Fax: [phone]  ? ?[facility] ?

## 2021-05-12 NOTE — Assessment & Plan Note (Signed)
Continue pantoprazole 40 mg daily in AM. ?F/u with GI in 8 weeks for repeat endoscopy. ?Avoid nsaids--alleve, advil, excedrin, aspirin.  ?Any signs of black stool or vomit again please go to ED. ?

## 2021-05-15 ENCOUNTER — Ambulatory Visit (INDEPENDENT_AMBULATORY_CARE_PROVIDER_SITE_OTHER): Payer: Medicare HMO

## 2021-05-15 DIAGNOSIS — I152 Hypertension secondary to endocrine disorders: Secondary | ICD-10-CM

## 2021-05-15 DIAGNOSIS — E1165 Type 2 diabetes mellitus with hyperglycemia: Secondary | ICD-10-CM

## 2021-05-15 NOTE — Patient Instructions (Signed)
Visit Information ?It was great speaking with you today!  Please let me know if you have any questions about our visit. ? ? Goals Addressed   ? ?  ?  ?  ?  ? This Visit's Progress  ?  Monitor and Manage My Blood Sugar-Diabetes Type 2   On track  ?  Timeframe:  Long-Range Goal ?Priority:  High ?Start Date: 01/23/2021                            ?Expected End Date: 01/23/2022                     ? ?Follow Up within 30 days ?  ?- check blood sugar before breakfast ?- check blood sugar if I feel it is too high or too low ?- enter blood sugar readings and medication or insulin into daily log ?- take the blood sugar log to all doctor visits  ?  ?Why is this important?   ?Checking your blood sugar at home helps to keep it from getting very high or very low.  ?Writing the results in a diary or log helps the doctor know how to care for you.  ?Your blood sugar log should have the time, date and the results.  ?Also, write down the amount of insulin or other medicine that you take.  ?Other information, like what you ate, exercise done and how you were feeling, will also be helpful.   ?  ?Notes:  ?  ? ?  ? ? ?Patient Care Plan: General Pharmacy (Adult)  ?  ? ?Problem Identified: Hyperlipidemia, Diabetes, Asthma, Overactive Bladder, and Bipolar Disorder, Chronic Pain, Chronic Migraines   ?Priority: High  ?  ? ?Long-Range Goal: Patient-Specific Goal   ?Start Date: 01/23/2021  ?Expected End Date: 01/23/2022  ?This Visit's Progress: On track  ?Recent Progress: On track  ?Priority: High  ?Note:   ?Current Barriers:  ?Unable to independently afford treatment regimen ?Unable to achieve control of diabetes  ? ?Pharmacist Clinical Goal(s):  ?Patient will verbalize ability to afford treatment regimen ?achieve control of Diabetes as evidenced by A1c less than 8% through collaboration with PharmD and provider.  ? ?Interventions: ?1:1 collaboration with Alfredia Ferguson, PA-C regarding development and update of comprehensive plan of care as  evidenced by provider attestation and co-signature ?Inter-disciplinary care team collaboration (see longitudinal plan of care) ?Comprehensive medication review performed; medication list updated in electronic medical record ? ?Hypertension (BP goal <130/80) ?-Uncontrolled ?-Current treatment: ?Losartan 50 mg daily: Appropriate, Query effective  ?-Medications previously tried: NA  ?-Current home readings (utilizes wrist monitor):  ?3/16: 135/84 ?3/18: 175/84 ?3/20: 190/101  ?-Denies hypotensive/hypertensive symptoms ?-Could consider switching to Candesartan given some evidence of use in migraine prevention.  ?-Recommend purchasing arm cuff for more accurate home blood pressure monitoring.  ? ?Hyperlipidemia: (LDL goal < 70) ?-Uncontrolled ?-Current treatment: ?Rosuvastatin 5 mg daily (AM) ?-Medications previously tried: NA  ?-Recommended to continue current medication ? ?Diabetes (A1c goal <8%) ?-Controlled, but in the setting of 4L of blood transfusion.  ?-Current medications: ?Glipizide XL 10 mg daily: Appropriate, Query effective  ?Trulicity 1.5 mg weekly: Appropriate, Query effective ?-Medications previously tried: Metformin   ?-Has not received Trulicity 3 mg yet, scheduled to arrive this week.  ?-Current home glucose readings ?fasting glucose: 219, 189, 114,  ?-Denies hyperglycemic symptoms:   ?-Current meal patterns: Working on portion control.  ?-Current exercise: Limited by leg pain following knee  replacement/femur fracture. Chair exercises 3 times weekly.  ?-Continue current medications ? ?Asthma (Goal: control symptoms and prevent exacerbations) ?-Not ideally controlled ?-Current treatment  ?Ventolin HFA 1-2 puffs every 4-6 hours as needed ?Incruse 1 puff daily  ?Montelukast 10 mg daily  ?-Medications previously tried: NA  ?-Exacerbations requiring treatment in last 6 months: None ?-Patient denies consistent use of maintenance inhaler.  ?-Frequency of rescue inhaler use: 1-2 times weekly ?-Counseled on  Benefits of consistent maintenance inhaler use ?-Recommended to continue current medication ? ?Bipolar Disorder (Goal: Achieve symptom remission) ?-Not ideally controlled ?-Current treatment: ?None  ?-Medications previously tried/failed: Divalproex, Benztropine, Wellbutrin, Citalopram, Sertraline  ?-Counseled on importance of keeping upcoming psychiatry appointment  ? ?Migraines (Goal: Prevent migraines) ?-Uncontrolled ?-Current treatment  ?None ?-Medications previously tried: Divalproex, Excedrin (GI Bleed) ?-Migraines occurring 3 times weekly. Typically lasts 4-6 hours. Symptoms include: Throbbing, pressure, and nausea.  Treats with tylenol, which is ineffective.  ?-Recommend propanolol 20 mg twice daily or topiramate 25 mg daily for migraines prevention ? ?Chronic Pain (Goal: Minimize symptoms) ?-Controlled ?-Managed by Altamese Cabal, PA-C (Ortho) ?-Current treatment  ?Baclofen 10 mg twice daily as needed  ?Boswellia extract 500 mg twice daily  ?Tramadol 50 mg q6h PRN  ?-Medications previously tried: NA ?-Patient also reports CBD gummy use.  ?-Significant leg pain following left knee replacement and left femoral fracture.  ?-Requires rare use of tramadol   ?-Recommended to continue current medication ? ?Peptic Ulcer Disease (Goal: Prevent GI bleeds) ?-Controlled ?-Current treatment  ?Pantoprazole 40 mg twice daily  ?-Medications previously tried: NA  ?-Recommended to continue current medication ? ?Patient Goals/Self-Care Activities ?Patient will:  ?- check glucose daily before breakfast, document, and provide at future appointments ?Complete patient assistance applications for Trulicity, Incruse ? ?Follow Up Plan: Telephone follow up appointment with care management team member scheduled for:  06/16/2021 at 11:00 AM ?  ? ?Patient agreed to services and verbal consent obtained.  ? ?Patient verbalizes understanding of instructions and care plan provided today and agrees to view in MyChart. Active MyChart status  confirmed with patient.   ? ?Angelena Sole, PharmD, BCACP, CPP  ?Clinical Pharmacist Practitioner  ?[facility] ?[phone]  ?

## 2021-05-15 NOTE — Progress Notes (Signed)
? ?Chronic Care Management ?Pharmacy Note ? ?05/15/2021 ?Name:  Joanna Bishop MRN:  [id number] DOB:  01-Sep-1955 ? ?Summary: ?Patient presents for CCM follow-up.  ? ?-Has not received Trulicity 3 mg yet, scheduled to arrive this week.  ? ?-Migraines occurring 3 times weekly. Typically lasts 4-6 hours. Symptoms include: Throbbing, pressure, and nausea.  Treats with tylenol, which is ineffective.  ? ?-Home blood pressures elevated, patient monitors with wrist monitor.  ? ? ?Recommendations/Changes made from today's visit: ?-Recommend propanolol 20 mg twice daily or topiramate 25 mg daily for migraines prevention ? ?-Recommend switching losartan to Candesartan 16-32 mg daily given some evidence of use in migraine prevention.  ? ?-Recommend purchasing arm cuff for more accurate home blood pressure monitoring.  ? ?Plan: ?CPP follow-up 1 month ? ?Recommended Problem List Changes:  ?Add: Migraine ? ?Subjective: ?Joanna Bishop is an 66 y.o. year old female who is a primary patient of [name], [name], [place].  The CCM team was consulted for assistance with disease management and care coordination needs.   ? ?Engaged with patient by telephone for follow up visit in response to provider referral for pharmacy case management and/or care coordination services.  ? ?Consent to Services:  ?The patient was given information about Chronic Care Management services, agreed to services, and gave verbal consent prior to initiation of services.  Please see initial visit note for detailed documentation.  ? ?Patient Care Team: ?Emelia Loron as PCP - General (Physician Assistant) ?Ocie Doyne, OD (Optometry) ?Germaine Pomfret, [facility] (Pharmacist) ? ?Recent office visits: ?05/12/21: Patient presented to [name], PA-C for follow-up. Aspirin stopped.   ?05/02/21: Patient presented to [name], PA-C for follow-up. Trulicity 3 mg weekly, losartan 50 mg daiyl started.  ? ?Recent consult visits: ?None noted. ? ?Hospital  visits: ?3/10-3/13/23: Patient hospitalized for GI bleed.  ?03/09/21: Patient presented to ED due to abdominal pain.  ? ?Objective: ? ?Lab Results  ?Component Value Date  ? CREATININE 1.09 (H) 05/02/2021  ? BUN 24 05/02/2021  ? GFRNONAA 60 (L) 03/09/2021  ? GFRAA >60 06/25/2019  ? NA 138 05/02/2021  ? K 4.4 05/02/2021  ? CALCIUM 10.1 05/02/2021  ? CO2 22 05/02/2021  ? GLUCOSE 304 (H) 05/02/2021  ? ? ?Lab Results  ?Component Value Date/Time  ? HGBA1C 13.8 (A) 01/12/2021 01:33 PM  ? HGBA1C 10.4 (A) 08/28/2019 06:38 PM  ? HGBA1C 8.8 (H) 03/12/2018 09:32 AM  ? HGBA1C 6.4 (H) 12/20/2017 09:24 AM  ?  ?Last diabetic Eye exam:  ?Lab Results  ?Component Value Date/Time  ? HMDIABEYEEXA No Retinopathy 04/04/2020 12:00 AM  ?  ?Last diabetic Foot exam: No results found for: HMDIABFOOTEX  ? ?Lab Results  ?Component Value Date  ? CHOL 170 01/26/2021  ? HDL 44 01/26/2021  ? Diamond 82 01/26/2021  ? TRIG 267 (H) 01/26/2021  ? CHOLHDL 3.9 01/26/2021  ? ? ?Hepatic Function Latest Ref Rng & Units 03/09/2021 01/26/2021 03/12/2018  ?Total Protein 6.5 - 8.1 g/dL 7.4 6.5 6.5  ?Albumin 3.5 - 5.0 g/dL 4.5 4.5 4.2  ?AST 15 - 41 U/L 30 30 34  ?ALT 0 - 44 U/L 33 38(H) 45(H)  ?Alk Phosphatase 38 - 126 U/L 90 141(H) 95  ?Total Bilirubin 0.3 - 1.2 mg/dL 1.2 0.7 0.4  ? ? ?Lab Results  ?Component Value Date/Time  ? TSH 2.510 10/14/2015 11:41 AM  ? TSH 3.030 07/12/2015 07:23 PM  ? TSH 1.730 09/13/2014 10:26 AM  ? ? ?CBC Latest Ref Rng &  Units 05/02/2021 03/09/2021 06/25/2019  ?WBC 3.4 - 10.8 x10E3/uL 11.8(H) 11.4(H) 9.5  ?Hemoglobin 11.1 - 15.9 g/dL 13.8 13.5 14.2  ?Hematocrit 34.0 - 46.6 % 41.1 39.2 41.6  ?Platelets 150 - 450 x10E3/uL 216 257 195  ? ? ?Lab Results  ?Component Value Date/Time  ? VD25OH 29.0 (L) 10/14/2015 11:41 AM  ? ? ?Clinical ASCVD: No  ?The 10-year ASCVD risk score (Arnett DK, et al., 2019) is: 14.7% ?  Values used to calculate the score: ?    Age: 66 years ?    Sex: Female ?    Is Non-Hispanic African American: No ?    Diabetic: Yes ?     Tobacco smoker: No ?    Systolic Blood Pressure: 875 mmHg ?    Is BP treated: Yes ?    HDL Cholesterol: 44 mg/dL ?    Total Cholesterol: 170 mg/dL   ? ?Depression screen [facility] 2/9 05/02/2021 03/08/2021 01/12/2021  ?Decreased Interest 3 0 2  ?Down, Depressed, Hopeless 3 0 2  ?PHQ - 2 Score 6 0 4  ?Altered sleeping 3 0 3  ?Tired, decreased energy 3 0 3  ?Change in appetite 3 0 2  ?Feeling bad or failure about yourself  3 0 2  ?Trouble concentrating 3 0 3  ?Moving slowly or fidgety/restless 3 0 2  ?Suicidal thoughts 1 0 1  ?PHQ-9 Score 25 0 20  ?Difficult doing work/chores Extremely dIfficult Not difficult at all Very difficult  ?  ?Social History  ? ?Tobacco Use  ?Smoking Status Never  ?Smokeless Tobacco Never  ? ?BP Readings from Last 3 Encounters:  ?05/12/21 138/83  ?05/02/21 (!) 165/93  ?03/09/21 (!) 164/110  ? ?Pulse Readings from Last 3 Encounters:  ?05/12/21 80  ?05/02/21 (!) 113  ?03/09/21 (!) 109  ? ?Wt Readings from Last 3 Encounters:  ?05/12/21 214 lb (97.1 kg)  ?05/02/21 220 lb 4.8 oz (99.9 kg)  ?03/09/21 218 lb (98.9 kg)  ? ?BMI Readings from Last 3 Encounters:  ?05/12/21 36.73 kg/m?  ?05/02/21 37.81 kg/m?  ?03/09/21 36.28 kg/m?  ? ? ?Assessment/Interventions: Review of patient past medical history, allergies, medications, health status, including review of consultants reports, laboratory and other test data, was performed as part of comprehensive evaluation and provision of chronic care management services.  ? ?SDOH:  (Social Determinants of Health) assessments and interventions performed: Yes ?SDOH Interventions   ? ?Flowsheet Row Most Recent Value  ?SDOH Interventions   ?Financial Strain Interventions Intervention Not Indicated  ? ?  ? ? ? ? ?SDOH Screenings  ? ?Alcohol Screen: Low Risk   ? Last Alcohol Screening Score (AUDIT): 0  ?Depression (PHQ2-9): Medium Risk  ? PHQ-2 Score: 25  ?Financial Resource Strain: Low Risk   ? Difficulty of Paying Living Expenses: Not hard at all  ?Food Insecurity: No Food  Insecurity  ? Worried About Charity fundraiser in the Last Year: Never true  ? Ran Out of Food in the Last Year: Never true  ?Housing: Low Risk   ? Last Housing Risk Score: 0  ?Physical Activity: Insufficiently Active  ? Days of Exercise per Week: 4 days  ? Minutes of Exercise per Session: 10 min  ?Social Connections: Socially Isolated  ? Frequency of Communication with Friends and Family: More than three times a week  ? Frequency of Social Gatherings with Friends and Family: Twice a week  ? Attends Religious Services: Never  ? Active Member of Clubs or Organizations: No  ? Attends  Club or Organization Meetings: Never  ? Marital Status: Widowed  ?Stress: No Stress Concern Present  ? Feeling of Stress : Not at all  ?Tobacco Use: Low Risk   ? Smoking Tobacco Use: Never  ? Smokeless Tobacco Use: Never  ? Passive Exposure: Not on file  ?Transportation Needs: No Transportation Needs  ? Lack of Transportation (Medical): No  ? Lack of Transportation (Non-Medical): No  ? ? ?CCM Care Plan ? ?Allergies  ?Allergen Reactions  ? Penicillins Swelling  ? ? ?Medications Reviewed Today   ? ? Reviewed by Alanson Puls, CMA (Certified Medical Assistant) on 05/12/21 at 85  Med List Status: <None>  ? ?Medication Order Taking? Sig Documenting Provider Last Dose Status Informant  ?albuterol (VENTOLIN HFA) 108 (90 Base) MCG/ACT inhaler [id number] Yes INHALE 1-2 PUFFS INTO THE LUNGS EVERY 4 TO 6 HOURS AS NEEDED FOR WHEEZING OR SHORTNESS OF BREATH. [name], PA-C Taking Active   ?aspirin 81 MG tablet [id number] No Take by mouth daily.  ?Patient not taking: Reported on 05/12/2021  ? [provider] Not Taking Active   ?         ?Med Note Gillie Manners Aug 02, 2014  4:58 PM) Received from: [facility]:   ?aspirin-acetaminophen-caffeine (EXCEDRIN MIGRAINE) [phone] MG tablet [id number] No Take 1 tablet by mouth every 6 (six) hours as needed for headache.  ?Patient not taking:  Reported on 05/12/2021  ? [provider] Not Taking Active   ?baclofen (LIORESAL) 10 MG tablet [id number] Yes Take 1 tablet (10 mg total) by mouth 2 (two) times daily as needed for muscle spasms. Danton Clap,

## 2021-05-23 DIAGNOSIS — M5412 Radiculopathy, cervical region: Secondary | ICD-10-CM | POA: Diagnosis not present

## 2021-05-26 DIAGNOSIS — I10 Essential (primary) hypertension: Secondary | ICD-10-CM | POA: Diagnosis not present

## 2021-05-26 DIAGNOSIS — E1165 Type 2 diabetes mellitus with hyperglycemia: Secondary | ICD-10-CM | POA: Diagnosis not present

## 2021-05-26 DIAGNOSIS — F319 Bipolar disorder, unspecified: Secondary | ICD-10-CM | POA: Diagnosis not present

## 2021-05-26 DIAGNOSIS — Z7984 Long term (current) use of oral hypoglycemic drugs: Secondary | ICD-10-CM | POA: Diagnosis not present

## 2021-05-26 DIAGNOSIS — J45909 Unspecified asthma, uncomplicated: Secondary | ICD-10-CM

## 2021-05-26 DIAGNOSIS — E785 Hyperlipidemia, unspecified: Secondary | ICD-10-CM

## 2021-05-26 DIAGNOSIS — R69 Illness, unspecified: Secondary | ICD-10-CM | POA: Diagnosis not present

## 2021-06-01 ENCOUNTER — Encounter (HOSPITAL_COMMUNITY): Payer: Self-pay

## 2021-06-01 ENCOUNTER — Ambulatory Visit (HOSPITAL_COMMUNITY): Payer: Self-pay | Admitting: Licensed Clinical Social Worker

## 2021-06-01 DIAGNOSIS — M5412 Radiculopathy, cervical region: Secondary | ICD-10-CM | POA: Diagnosis not present

## 2021-06-05 ENCOUNTER — Ambulatory Visit: Admitting: Physician Assistant

## 2021-06-06 ENCOUNTER — Ambulatory Visit: Admitting: Physician Assistant

## 2021-06-08 ENCOUNTER — Telehealth: Payer: Self-pay

## 2021-06-08 DIAGNOSIS — E1142 Type 2 diabetes mellitus with diabetic polyneuropathy: Secondary | ICD-10-CM

## 2021-06-08 MED ORDER — TRULICITY 3 MG/0.5ML ~~LOC~~ SOAJ: 3.0000 mg | SUBCUTANEOUS | 3 refills | Status: DC

## 2021-06-08 NOTE — Addendum Note (Signed)
Addended by: Julious Payer A on: 06/08/2021 04:19 PM ? ? Modules accepted: Orders ? ?

## 2021-06-08 NOTE — Progress Notes (Signed)
? ? ?Chronic Care Management ?Pharmacy Assistant  ? ?Name: Joanna Bishop  MRN: [id number] DOB: 01/06/1956 ? ?Reason for Encounter: Medication Review/Medication Coordination Call for [facility] ? ?Recent office visits:  ?None ID ? ?Recent consult visits:  ?06/01/2021 Behavioral Health Visit ? ?Hospital visits:  ?None in previous 6 months ? ?Medications: ?Outpatient Encounter Medications as of 06/08/2021  ?Medication Sig Note  ? albuterol (VENTOLIN HFA) 108 (90 Base) MCG/ACT inhaler INHALE 1-2 PUFFS INTO THE LUNGS EVERY 4 TO 6 HOURS AS NEEDED FOR WHEEZING OR SHORTNESS OF BREATH.   ? baclofen (LIORESAL) 10 MG tablet Take 1 tablet (10 mg total) by mouth 2 (two) times daily as needed for muscle spasms.   ? blood glucose meter kit and supplies Dispense based on patient and insurance preference. Use daily as directed. 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   ? Boswellia Serrata (BOSWELLIA PO) Take 500 mg by mouth in the morning and at bedtime.   ? Dulaglutide (TRULICITY) 3 GB/1.5VV SOPN Inject 3 mg as directed once a week. 05/15/2021: Has not received yet  ? glipiZIDE (GLUCOTROL XL) 10 MG 24 hr tablet TAKE ONE TABLET BY MOUTH EVERY MORNING with breakfast   ? glucose blood (ONETOUCH VERIO) test strip Use to check blood sugars once daily as instructed   ? losartan (COZAAR) 50 MG tablet Take 1 tablet (50 mg total) by mouth daily.   ? montelukast (SINGULAIR) 10 MG tablet Take 1 tablet (10 mg total) by mouth daily.   ? Multiple Vitamin (MULTIVITAMIN WITH MINERALS) TABS tablet Take 1 tablet by mouth daily. Centrum Silver Women's 50+   ? NON FORMULARY Take 1 capsule by mouth in the morning and at bedtime. AZO Bladder Control Supplement   ? OneTouch Delica Lancets 61Y MISC Use to check blood sugars once daily as directed.   ? pantoprazole (PROTONIX) 40 MG tablet Take 40 mg by mouth 2 (two) times daily.   ? rosuvastatin (CRESTOR) 5 MG tablet Take 1 tablet (5 mg total) by mouth daily.   ? traMADol (ULTRAM) 50 MG tablet Take 1 tablet (50 mg total)  by mouth every 6 (six) hours as needed.   ? umeclidinium bromide (INCRUSE ELLIPTA) 62.5 MCG/ACT AEPB Inhale 1 puff into the lungs daily.   ? [DISCONTINUED] XARELTO 10 MG TABS tablet Take 10 mg by mouth every morning.   ? ?No facility-administered encounter medications on file as of 06/08/2021.  ? ?Care Gaps: ?COVID-19 Vaccine ?HIV Screening ?Zoster Vaccines ?Diabetic Foot Exam ?Tetanus/TDAP ?Influenza Vaccine ?Diabetic Eye Exam ?Mammogram ?Fecal DNA (Cologuard) ? ?Star Rating Drugs: ?Losartan 50 mg last filled on 05/17/2021 for a 19-Day Supply with [facility] ?Glipizide 10 mg last filled on 05/16/2021 for a 30-Day Supply with [facility] ?Rosuvastatin 5 mg last filled on 05/16/2021 for a 30-Day supply with [facility] ?Trulicity 3 mg patient receives this medication from the [facility] so not sure when the last fill date was ? ?BP Readings from Last 3 Encounters:  ?05/12/21 138/83  ?05/02/21 (!) 165/93  ?03/09/21 (!) 164/110  ?  ?Lab Results  ?Component Value Date  ? HGBA1C 13.8 (A) 01/12/2021  ?  ?Patient obtains medications through V[facility]  30 Days  ? ?Last adherence delivery included:  ?OneTouch Verio Test Strips ?Losartan 50 mg 1 tablet daily (Breakfast) ?Albuterol 108 Inhaler inhale 1-2 puffs every every 4-6 hours prn for wheezing or shortness of breath ?Glipizide 10 mg 1 tablet daily (Breakfast) ?Montelukast 10 mg 1 tablet daily (Bedtime) ?Rosuvastatin 5 mg 1 tablet daily (Breakfast) ? ?  Patient declined medications last month: ?Trulicity Inject 1.5 mg into the skin once a week- Patient is getting this medication through [facility] ?Increase Ellipta 62.5 mcg Inhale 1 puff into lungs daily-Patient is getting this medication through [facility] ?Oxybutynin 10 mg 1 tablet daily (Bedtime)-Patient stated she does not take this medication ? ?Patient is due for next adherence delivery on: 06/20/2021 (Tuesday) 2nd Route after 3 pm. ? ?Called patient and reviewed medications and coordinated delivery. ? ?This delivery  to include: ?Losartan 50 mg 1 tablet daily (Breakfast) ?Albuterol 108 Inhaler inhale 1-2 puffs every every 4-6 hours prn for wheezing or shortness of breath ?Glipizide 10 mg 1 tablet daily (Breakfast) ?Montelukast 10 mg 1 tablet daily (Bedtime) ?Rosuvastatin 5 mg 1 tablet daily (Breakfast) ? ?Patient declined the following medications: ?Trulicity Inject 1.5 mg into the skin once a week- Patient is getting this medication through [facility] ?Increase Ellipta 62.5 mcg Inhale 1 puff into lungs daily-Patient is getting this medication through [facility] ? ?Patient needs refills for: ?No refills needed for this months order ? ?Confirmed delivery date of 06/20/2021 2nd route, advised patient that pharmacy will contact them the morning of delivery. ? ?Patient stated that she only has one week of her Trulicity left. Patient receives this medication from [facility]. Per patient's chart her Trulicity was increased to 3 mg and there were refills sent on 03/07. I informed her that I would contact the [facility] to make sure they received the refill information. ? ?I contacted [facility] and spoke with a representative who advised they do not do automatic refills that the patient has to all and request refills. The representative did allow me to request the refill for the patient this time. She also advised the prescription was for 30-Day supplies vs a 90-Day supply. ? ?If possible patient would like to have a 90-Day supply script sent into the [facility]. I informed her I would submit the request to CPP to have him send that in for her.  ? ?CPP notified.  ? ?Patient has a scheduled telephone appointment with Junius Argyle, CPP on 06/13/2021 @ 1300. ? ? ?Lynann Bologna, CPA/CMA ?Clinical Pharmacist Assistant ?Phone: [phone]  ? ? ? ? ?

## 2021-06-12 ENCOUNTER — Telehealth: Payer: Self-pay

## 2021-06-12 NOTE — Progress Notes (Signed)
? ? ?  Chronic Care Management ?Pharmacy Assistant  ? ?Name: Joanna Bishop  MRN: [id number] DOB: 03/23/1955 ? ?Patient called to be reminded of her telephone appointment with Junius Argyle, CPP on 06/13/2021 @ 1300. ? ?No answer, left message of appointment date, time and type of appointment (either telephone or in person). Left message to have all medications, supplements, blood pressure and/or blood sugar logs available during appointment and to return call if need to reschedule. ? ?Care Gaps: ?COVID-19 Vaccine ?HIV Screening ?Zoster Vaccines ?Diabetic Foot Exam ?Tetanus/TDAP ?Influenza Vaccine ?Diabetic Eye Exam ?Mammogram ?Fecal DNA (Cologuard) ?  ?Star Rating Drugs: ?Losartan 50 mg last filled on 05/17/2021 for a 19-Day Supply with [facility] ?Glipizide 10 mg last filled on 05/16/2021 for a 30-Day Supply with [facility] ?Rosuvastatin 5 mg last filled on 05/16/2021 for a 30-Day supply with [facility] ?Trulicity 3 mg patient receives this medication from the [place] so not sure when the last fill date was ? ?Any gaps in medications fill history? None ? ?Lynann Bologna, CPA/CMA ?Clinical Pharmacist Assistant ?Phone: [phone]  ? ?

## 2021-06-13 ENCOUNTER — Ambulatory Visit (INDEPENDENT_AMBULATORY_CARE_PROVIDER_SITE_OTHER): Payer: Medicare HMO

## 2021-06-13 DIAGNOSIS — E1165 Type 2 diabetes mellitus with hyperglycemia: Secondary | ICD-10-CM

## 2021-06-13 DIAGNOSIS — E1159 Type 2 diabetes mellitus with other circulatory complications: Secondary | ICD-10-CM

## 2021-06-13 NOTE — Progress Notes (Signed)
? ?Chronic Care Management ?Pharmacy Note ? ?06/13/2021 ?Name:  Joanna Bishop MRN:  [id number] DOB:  February 18, 1956 ? ?Summary: ?Patient presents for CCM follow-up.  ? ?-Home blood pressures elevated, patient monitors with wrist monitor.  ? ?-Tolerating Trulicity 3 mg dose well. Feels like glipizide "bottoms me out." Hesitant to change regimen given improvement in blood sugars.  ? ?-Migraine frequency and severity has improved recently.  ? ?Recommendations/Changes made from today's visit: ?-Recommend rechecking BP tomorrow: ?-If BP >130/80, recommend stopping Losartan, starting Candesartan 32 mg daily  ?-If BP <130/80, recommend stopping Losartan, starting Candesartan 16 mg daily  ? ?Plan: ?CPP follow-up 1 month ? ?Recommended Problem List Changes:  ?Add: Migraine ? ?Subjective: ?Joanna Bishop is an 66 y.o. year old female who is a primary patient of [name], [name], [place].  The CCM team was consulted for assistance with disease management and care coordination needs.   ? ?Engaged with patient by telephone for follow up visit in response to provider referral for pharmacy case management and/or care coordination services.  ? ?Consent to Services:  ?The patient was given information about Chronic Care Management services, agreed to services, and gave verbal consent prior to initiation of services.  Please see initial visit note for detailed documentation.  ? ?Patient Care Team: ?Emelia Loron as PCP - General (Physician Assistant) ?Ocie Doyne, OD (Optometry) ?Germaine Pomfret, [facility] (Pharmacist) ? ?Recent office visits: ?05/12/21: Patient presented to [name], PA-C for follow-up. Aspirin stopped.   ?05/02/21: Patient presented to [name], PA-C for follow-up. Trulicity 3 mg weekly, losartan 50 mg daiyl started.  ? ?Recent consult visits: ?None noted. ? ?Hospital visits: ?3/10-3/13/23: Patient hospitalized for GI bleed.  ?03/09/21: Patient presented to ED due to abdominal pain.  ? ?Objective: ? ?Lab  Results  ?Component Value Date  ? CREATININE 1.09 (H) 05/02/2021  ? BUN 24 05/02/2021  ? GFRNONAA 60 (L) 03/09/2021  ? GFRAA >60 06/25/2019  ? NA 138 05/02/2021  ? K 4.4 05/02/2021  ? CALCIUM 10.1 05/02/2021  ? CO2 22 05/02/2021  ? GLUCOSE 304 (H) 05/02/2021  ? ? ?Lab Results  ?Component Value Date/Time  ? HGBA1C 13.8 (A) 01/12/2021 01:33 PM  ? HGBA1C 10.4 (A) 08/28/2019 06:38 PM  ? HGBA1C 8.8 (H) 03/12/2018 09:32 AM  ? HGBA1C 6.4 (H) 12/20/2017 09:24 AM  ?  ?Last diabetic Eye exam:  ?Lab Results  ?Component Value Date/Time  ? HMDIABEYEEXA No Retinopathy 04/04/2020 12:00 AM  ?  ?Last diabetic Foot exam: No results found for: HMDIABFOOTEX  ? ?Lab Results  ?Component Value Date  ? CHOL 170 01/26/2021  ? HDL 44 01/26/2021  ? [place] 82 01/26/2021  ? TRIG 267 (H) 01/26/2021  ? CHOLHDL 3.9 01/26/2021  ? ? ? ?  Latest Ref Rng & Units 03/09/2021  ?  6:49 PM 01/26/2021  ? 10:18 AM 03/12/2018  ?  9:32 AM  ?Hepatic Function  ?Total Protein 6.5 - 8.1 g/dL 7.4   6.5   6.5    ?Albumin 3.5 - 5.0 g/dL 4.5   4.5   4.2    ?AST 15 - 41 U/L 30   30   34    ?ALT 0 - 44 U/L 33   38   45    ?Alk Phosphatase 38 - 126 U/L 90   141   95    ?Total Bilirubin 0.3 - 1.2 mg/dL 1.2   0.7   0.4    ? ? ?Lab Results  ?Component Value  Date/Time  ? TSH 2.510 10/14/2015 11:41 AM  ? TSH 3.030 07/12/2015 07:23 PM  ? TSH 1.730 09/13/2014 10:26 AM  ? ? ? ?  Latest Ref Rng & Units 05/02/2021  ?  2:33 PM 03/09/2021  ?  6:49 PM 06/25/2019  ?  6:02 PM  ?CBC  ?WBC 3.4 - 10.8 x10E3/uL 11.8   11.4   9.5    ?Hemoglobin 11.1 - 15.9 g/dL 13.8   13.5   14.2    ?Hematocrit 34.0 - 46.6 % 41.1   39.2   41.6    ?Platelets 150 - 450 x10E3/uL 216   257   195    ? ? ?Lab Results  ?Component Value Date/Time  ? VD25OH 29.0 (L) 10/14/2015 11:41 AM  ? ? ?Clinical ASCVD: No  ?The 10-year ASCVD risk score (Arnett DK, et al., 2019) is: 14.7% ?  Values used to calculate the score: ?    Age: 49 years ?    Sex: Female ?    Is Non-Hispanic African American: No ?    Diabetic: Yes ?    Tobacco  smoker: No ?    Systolic Blood Pressure: 681 mmHg ?    Is BP treated: Yes ?    HDL Cholesterol: 44 mg/dL ?    Total Cholesterol: 170 mg/dL   ? ? ?  05/02/2021  ?  1:28 PM 03/08/2021  ?  9:05 AM 01/12/2021  ?  1:38 PM  ?Depression screen PHQ 2/9  ?Decreased Interest 3 0 2  ?Down, Depressed, Hopeless 3 0 2  ?PHQ - 2 Score 6 0 4  ?Altered sleeping 3 0 3  ?Tired, decreased energy 3 0 3  ?Change in appetite 3 0 2  ?Feeling bad or failure about yourself  3 0 2  ?Trouble concentrating 3 0 3  ?Moving slowly or fidgety/restless 3 0 2  ?Suicidal thoughts 1 0 1  ?PHQ-9 Score 25 0 20  ?Difficult doing work/chores Extremely dIfficult Not difficult at all Very difficult  ?  ?Social History  ? ?Tobacco Use  ?Smoking Status Never  ?Smokeless Tobacco Never  ? ?BP Readings from Last 3 Encounters:  ?05/12/21 138/83  ?05/02/21 (!) 165/93  ?03/09/21 (!) 164/110  ? ?Pulse Readings from Last 3 Encounters:  ?05/12/21 80  ?05/02/21 (!) 113  ?03/09/21 (!) 109  ? ?Wt Readings from Last 3 Encounters:  ?05/12/21 214 lb (97.1 kg)  ?05/02/21 220 lb 4.8 oz (99.9 kg)  ?03/09/21 218 lb (98.9 kg)  ? ?BMI Readings from Last 3 Encounters:  ?05/12/21 36.73 kg/m?  ?05/02/21 37.81 kg/m?  ?03/09/21 36.28 kg/m?  ? ? ?Assessment/Interventions: Review of patient past medical history, allergies, medications, health status, including review of consultants reports, laboratory and other test data, was performed as part of comprehensive evaluation and provision of chronic care management services.  ? ?SDOH:  (Social Determinants of Health) assessments and interventions performed: Yes ? ? ? ? ? ?SDOH Screenings  ? ?Alcohol Screen: Low Risk   ? Last Alcohol Screening Score (AUDIT): 0  ?Depression (PHQ2-9): Medium Risk  ? PHQ-2 Score: 25  ?Financial Resource Strain: Low Risk   ? Difficulty of Paying Living Expenses: Not hard at all  ?Food Insecurity: No Food Insecurity  ? Worried About Charity fundraiser in the Last Year: Never true  ? Ran Out of Food in the Last Year:  Never true  ?Housing: Low Risk   ? Last Housing Risk Score: 0  ?Physical Activity: Insufficiently Active  ?  Days of Exercise per Week: 4 days  ? Minutes of Exercise per Session: 10 min  ?Social Connections: Socially Isolated  ? Frequency of Communication with Friends and Family: More than three times a week  ? Frequency of Social Gatherings with Friends and Family: Twice a week  ? Attends Religious Services: Never  ? Active Member of Clubs or Organizations: No  ? Attends Archivist Meetings: Never  ? Marital Status: Widowed  ?Stress: No Stress Concern Present  ? Feeling of Stress : Not at all  ?Tobacco Use: Low Risk   ? Smoking Tobacco Use: Never  ? Smokeless Tobacco Use: Never  ? Passive Exposure: Not on file  ?Transportation Needs: No Transportation Needs  ? Lack of Transportation (Medical): No  ? Lack of Transportation (Non-Medical): No  ? ? ?CCM Care Plan ? ?Allergies  ?Allergen Reactions  ? Penicillins Swelling  ? ? ?Medications Reviewed Today   ? ? Reviewed by Alanson Puls, CMA (Certified Medical Assistant) on 05/12/21 at 39  Med List Status: <None>  ? ?Medication Order Taking? Sig Documenting Provider Last Dose Status Informant  ?albuterol (VENTOLIN HFA) 108 (90 Base) MCG/ACT inhaler [id number] Yes INHALE 1-2 PUFFS INTO THE LUNGS EVERY 4 TO 6 HOURS AS NEEDED FOR WHEEZING OR SHORTNESS OF BREATH. [name], PA-C Taking Active   ?aspirin 81 MG tablet [id number] No Take by mouth daily.  ?Patient not taking: Reported on 05/12/2021  ? [provider] Not Taking Active   ?         ?Med Note Gillie Manners Aug 02, 2014  4:58 PM) Received from: [facility]:   ?aspirin-acetaminophen-caffeine (EXCEDRIN MIGRAINE) [phone] MG tablet [id number] No Take 1 tablet by mouth every 6 (six) hours as needed for headache.  ?Patient not taking: Reported on 05/12/2021  ? [provider] Not Taking Active   ?baclofen (LIORESAL) 10 MG tablet [id number] Yes  Take 1 tablet (10 mg total) by mouth 2 (two) times daily as needed for muscle spasms. Danton Clap, PA-C Taking Active   ?blood glucose meter kit and supplies [id number] Yes Dispense based on patient and

## 2021-06-13 NOTE — Patient Instructions (Signed)
Visit Information ?It was great speaking with you today!  Please let me know if you have any questions about our visit. ? ? Goals Addressed   ? ?  ?  ?  ?  ? This Visit's Progress  ?  Monitor and Manage My Blood Sugar-Diabetes Type 2   On track  ?  Timeframe:  Long-Range Goal ?Priority:  High ?Start Date: 01/23/2021                            ?Expected End Date: 01/23/2022                     ? ?Follow Up within 30 days ?  ?- check blood sugar before breakfast ?- check blood sugar if I feel it is too high or too low ?- enter blood sugar readings and medication or insulin into daily log ?- take the blood sugar log to all doctor visits  ?  ?Why is this important?   ?Checking your blood sugar at home helps to keep it from getting very high or very low.  ?Writing the results in a diary or log helps the doctor know how to care for you.  ?Your blood sugar log should have the time, date and the results.  ?Also, write down the amount of insulin or other medicine that you take.  ?Other information, like what you ate, exercise done and how you were feeling, will also be helpful.   ?  ?Notes:  ?  ? ?  ? ? ?Patient Care Plan: General Pharmacy (Adult)  ?  ? ?Problem Identified: Hyperlipidemia, Diabetes, Asthma, Overactive Bladder, and Bipolar Disorder, Chronic Pain, Chronic Migraines   ?Priority: High  ?  ? ?Long-Range Goal: Patient-Specific Goal   ?Start Date: 01/23/2021  ?Expected End Date: 01/23/2022  ?This Visit's Progress: On track  ?Recent Progress: On track  ?Priority: High  ?Note:   ?Current Barriers:  ?Unable to independently afford treatment regimen ?Unable to achieve control of diabetes  ? ?Pharmacist Clinical Goal(s):  ?Patient will verbalize ability to afford treatment regimen ?achieve control of Diabetes as evidenced by A1c less than 8% through collaboration with PharmD and provider.  ? ?Interventions: ?1:1 collaboration with Alfredia Ferguson, PA-C regarding development and update of comprehensive plan of care as  evidenced by provider attestation and co-signature ?Inter-disciplinary care team collaboration (see longitudinal plan of care) ?Comprehensive medication review performed; medication list updated in electronic medical record ? ?Hypertension (BP goal <130/80) ?-Uncontrolled ?-Current treatment: ?Losartan 50 mg daily: Appropriate, Query effective  ?-Medications previously tried: NA  ?-Current home readings (utilizes wrist monitor):  ?150/100  ?-Denies hypotensive/hypertensive symptoms ?-Elevated in the setting of tax season  ?-Recommend rechecking BP tomorrow: ?-If BP >130/80, recommend stopping Losartan, starting Candesartan 32 mg daily  ?-If BP <130/80, recommend stopping Losartan, starting Candesartan 16 mg daily  ? ?Hyperlipidemia: (LDL goal < 70) ?-Uncontrolled ?-Current treatment: ?Rosuvastatin 5 mg daily (AM) ?-Medications previously tried: NA  ?-Recommended to continue current medication ? ?Diabetes (A1c goal <8%) ?-Controlled, but in the setting of 4L of blood transfusion.  ?-Current medications: ?Glipizide XL 10 mg daily: Appropriate, Query effective  ?Trulicity 3 mg weekly: Appropriate, Query effective ?-Medications previously tried: Metformin (sluggish, stomach pain),   ?-Current home glucose readings ?fasting glucose: 120-155   ?-Reports hypoglycemia symptoms:   ?-Current meal patterns: Appetite significantly reduced   ?-Current exercise: Limited by leg pain following knee replacement/femur fracture. Chair exercises 3 times  weekly.  ?-Tolerating Trulicity 3 mg dose well. Feels like glipizide "bottoms me out." Hesitant to change regimen given improvement in blood sugars.  ?-Continue current medications ? ?Asthma (Goal: control symptoms and prevent exacerbations) ?-Not ideally controlled ?-Current treatment  ?Ventolin HFA 1-2 puffs every 4-6 hours as needed ?Incruse 1 puff daily  ?Montelukast 10 mg daily  ?-Medications previously tried: NA  ?-Exacerbations requiring treatment in last 6 months: None ?-Patient  denies consistent use of maintenance inhaler.  ?-Frequency of rescue inhaler use: 1-2 times weekly ?-Counseled on Benefits of consistent maintenance inhaler use ?-Recommended to continue current medication ? ?Bipolar Disorder (Goal: Achieve symptom remission) ?-Not ideally controlled ?-Current treatment: ?None  ?-Medications previously tried/failed: Divalproex, Benztropine, Wellbutrin, Citalopram, Sertraline  ?-Counseled on importance of keeping upcoming psychiatry appointment  ? ?Migraines (Goal: Prevent migraines) ?-Uncontrolled ?-Current treatment  ?None ?-Medications previously tried: Divalproex, Excedrin (GI Bleed) ?-Migraine frequency and severity has improved recently.  ?-Migraines occurring 1-2 times weekly. Typically lasts 4-6 hours. Symptoms include: Throbbing, pressure, and nausea.  Treats with tylenol, which is ineffective.  ?-Recommend topiramate 25 mg daily for migraines prevention ? ?Chronic Pain (Goal: Minimize symptoms) ?-Controlled ?-Managed by Altamese Cabal, PA-C (Ortho) ?-Current treatment  ?Baclofen 10 mg twice daily as needed  ?Boswellia extract 500 mg twice daily  ?Tramadol 50 mg q6h PRN  ?-Medications previously tried: NA ?-Patient also reports CBD gummy use.  ?-Significant leg pain following left knee replacement and left femoral fracture.  ?-Requires rare use of tramadol   ?-Recommended to continue current medication ? ?Peptic Ulcer Disease (Goal: Prevent GI bleeds) ?-Controlled ?-Current treatment  ?Pantoprazole 40 mg twice daily  ?-Medications previously tried: NA  ?-Recommended to continue current medication ? ?Patient Goals/Self-Care Activities ?Patient will:  ?- check glucose daily before breakfast, document, and provide at future appointments ? ?Follow Up Plan: Telephone follow up appointment with care management team member scheduled for:  07/17/21 at 9:15 AM ?  ? ? ? ?Patient agreed to services and verbal consent obtained.  ? ?Patient verbalizes understanding of instructions and  care plan provided today and agrees to view in MyChart. Active MyChart status confirmed with patient.   ? ?Angelena Sole, PharmD, BCACP, CPP  ?Clinical Pharmacist Practitioner  ?[facility] ?[phone]  ?

## 2021-06-14 ENCOUNTER — Encounter: Payer: Self-pay | Admitting: Physician Assistant

## 2021-06-14 ENCOUNTER — Ambulatory Visit (INDEPENDENT_AMBULATORY_CARE_PROVIDER_SITE_OTHER): Payer: Medicare HMO | Admitting: Physician Assistant

## 2021-06-14 VITALS — BP 120/70 | HR 111 | Temp 99.2°F | Ht 64.0 in | Wt 213.2 lb

## 2021-06-14 DIAGNOSIS — E1159 Type 2 diabetes mellitus with other circulatory complications: Secondary | ICD-10-CM | POA: Diagnosis not present

## 2021-06-14 DIAGNOSIS — I152 Hypertension secondary to endocrine disorders: Secondary | ICD-10-CM | POA: Diagnosis not present

## 2021-06-14 DIAGNOSIS — G43109 Migraine with aura, not intractable, without status migrainosus: Secondary | ICD-10-CM

## 2021-06-14 MED ORDER — NURTEC 75 MG PO TBDP: 1.0000 mg | ORAL_TABLET | Freq: Every day | ORAL | 3 refills | Status: DC | PRN

## 2021-06-14 NOTE — Assessment & Plan Note (Signed)
Currently taking losartan 50 mg daily ?Elevated in office but pt reports in range at home ?Advised to monitor closely at home and bring in record of recent values ?

## 2021-06-14 NOTE — Progress Notes (Signed)
?  ? ?I,[name],acting as a Education administrator for [employer], PA-C.,have documented all relevant documentation on the behalf of [name], PA-C,as directed by  [name], PA-C while in the presence of [name], PA-C. ? ? ?Established patient visit ? ? ?Patient: Joanna Bishop   DOB: October 09, 1955   66 y.o. Female  MRN: [id number] ?Visit Date: 06/14/2021 ? ?Today's healthcare provider: [name], PA-C  ? ?Cc. HTN f/u, migraines ? ?Subjective  ?  ?HPI  ? ?Hypertension, follow-up ? ?BP Readings from Last 3 Encounters:  ?06/14/21 120/70  ?05/12/21 138/83  ?05/02/21 (!) 165/93  ? Wt Readings from Last 3 Encounters:  ?06/14/21 213 lb 3.2 oz (96.7 kg)  ?05/12/21 214 lb (97.1 kg)  ?05/02/21 220 lb 4.8 oz (99.9 kg)  ?  ? ?Currently treating with losartan 50 mg.  ? ?She is following a Low Sodium diet. ?She is not exercising. ?She does not smoke. ? ?Outside blood pressures are 120/70 at home  ?Symptoms: ?No chest pain No chest pressure  ?No palpitations Yes syncope  ?No dyspnea No orthopnea  ?No paroxysmal nocturnal dyspnea Yes lower extremity edema  ? ?Pertinent labs ?Lab Results  ?Component Value Date  ? CHOL 170 01/26/2021  ? HDL 44 01/26/2021  ? [place] 82 01/26/2021  ? TRIG 267 (H) 01/26/2021  ? CHOLHDL 3.9 01/26/2021  ? Lab Results  ?Component Value Date  ? NA 138 05/02/2021  ? K 4.4 05/02/2021  ? CREATININE 1.09 (H) 05/02/2021  ? EGFR 57 (L) 05/02/2021  ? GLUCOSE 304 (H) 05/02/2021  ? TSH 2.510 10/14/2015  ?  ? ?The 10-year ASCVD risk score (Arnett DK, et al., 2019) is: 11.3% ? ?--------------------------------------------------------------------------------------------------- ?Migraines ?-Historically, for years. At worst as 4-5 times a week. Now once weekly. Used to treat with excedrin migraine but after bleed, stopped taking. ?-Now takes tylenol and lays in a dark room,headaches last 5-6+ hours. Typically at temples, acing, pressure. ?She does see an aura beforehand, usually spots. Sometimes feels  nauseous w/ headaches.  ? ? ?Medications: ?Outpatient Medications Prior to Visit  ?Medication Sig  ? albuterol (VENTOLIN HFA) 108 (90 Base) MCG/ACT inhaler INHALE 1-2 PUFFS INTO THE LUNGS EVERY 4 TO 6 HOURS AS NEEDED FOR WHEEZING OR SHORTNESS OF BREATH.  ? baclofen (LIORESAL) 10 MG tablet Take 1 tablet (10 mg total) by mouth 2 (two) times daily as needed for muscle spasms.  ? blood glucose meter kit and supplies Dispense based on patient and insurance preference. Use daily as directed. 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  ? Dulaglutide (TRULICITY) 3 VA/7.0LI SOPN Inject 3 mg as directed once a week.  ? glipiZIDE (GLUCOTROL XL) 10 MG 24 hr tablet TAKE ONE TABLET BY MOUTH EVERY MORNING with breakfast  ? glucose blood (ONETOUCH VERIO) test strip Use to check blood sugars once daily as instructed  ? losartan (COZAAR) 50 MG tablet Take 1 tablet (50 mg total) by mouth daily.  ? montelukast (SINGULAIR) 10 MG tablet Take 1 tablet (10 mg total) by mouth daily.  ? Multiple Vitamin (MULTIVITAMIN WITH MINERALS) TABS tablet Take 1 tablet by mouth daily. Centrum Silver Women's 50+  ? NON FORMULARY Take 1 capsule by mouth in the morning and at bedtime. AZO Bladder Control Supplement  ? OneTouch Delica Lancets 10V MISC Use to check blood sugars once daily as directed.  ? pantoprazole (PROTONIX) 40 MG tablet Take 40 mg by mouth 2 (two) times daily.  ? rosuvastatin (CRESTOR) 5 MG tablet Take 1 tablet (5 mg total) by  mouth daily.  ? traMADol (ULTRAM) 50 MG tablet Take 1 tablet (50 mg total) by mouth every 6 (six) hours as needed.  ? umeclidinium bromide (INCRUSE ELLIPTA) 62.5 MCG/ACT AEPB Inhale 1 puff into the lungs daily.  ? ?No facility-administered medications prior to visit.  ? ? ?Review of Systems  ?Constitutional:  Negative for fatigue and fever.  ?Respiratory:  Negative for cough and shortness of breath.   ?Cardiovascular:  Negative for chest pain and leg swelling.  ?Gastrointestinal:  Negative for abdominal pain.  ?Neurological:  Positive  for headaches. Negative for dizziness.  ? ? ?  Objective  ?  ?Blood pressure 120/70, pulse (!) 111, temperature 99.2 ?F (37.3 ?C), temperature source Oral, height 5' 4"  (1.626 m), weight 213 lb 3.2 oz (96.7 kg), SpO2 100 %.  ? ?Physical Exam ?Constitutional:   ?   General: She is awake.  ?   Appearance: She is well-developed.  ?HENT:  ?   Head: Normocephalic.  ?Eyes:  ?   Conjunctiva/sclera: Conjunctivae normal.  ?Cardiovascular:  ?   Rate and Rhythm: Normal rate and regular rhythm.  ?   Heart sounds: Normal heart sounds.  ?Pulmonary:  ?   Effort: Pulmonary effort is normal.  ?   Breath sounds: Normal breath sounds.  ?Skin: ?   General: Skin is warm.  ?Neurological:  ?   Mental Status: She is alert and oriented to person, place, and time.  ?Psychiatric:     ?   Attention and Perception: Attention normal.     ?   Mood and Affect: Mood normal.     ?   Speech: Speech normal.     ?   Behavior: Behavior is cooperative.  ?  ? ?No results found for any visits on 06/14/21. ? Assessment & Plan  ?  ? ?Problem List Items Addressed This Visit   ? ?  ? Cardiovascular and Mediastinum  ? Hypertension associated with diabetes ([place]) - Primary  ?  Currently taking losartan 50 mg daily ?Elevated in office but pt reports in range at home ?Advised to monitor closely at home and bring in record of recent values ? ?  ?  ? Migraine with aura and without status migrainosus, not intractable  ?  Pt has dealt with for years, at worst 4-5 times a week, but ever since incident in hospital has been weekly. ?Used to manage with excedrin migraine but d/c after PUD w/ bleed ?I do not feel she is a candidate for triptans, but advised a trial of nurtec.  ?Take at onset of aura do not take more than once daily. #4 75 mg ODT samples given in office ?Will monitor if no improvement consider neuro referral ? ?  ?  ? Relevant Medications  ? Rimegepant Sulfate (NURTEC) 75 MG TBDP  ?  ? ?Return in about 2 months (around 08/14/2021) for hypertension, DMII.  ?    ? ?I, [name], PA-C have reviewed all documentation for this visit. The documentation on  06/14/2021  for the exam, diagnosis, procedures, and orders are all accurate and complete. ? ?[name], PA-C ?[facility] ?[id number] ?[place], [place], [address] ?Office: [phone] ?Fax: [phone]  ? ? Medical Group ? ?

## 2021-06-14 NOTE — Assessment & Plan Note (Addendum)
Pt has dealt with for years, at worst 4-5 times a week, but ever since incident in hospital has been weekly. ?Used to manage with excedrin migraine but d/c after PUD w/ bleed ?I do not feel she is a candidate for triptans, but advised a trial of nurtec.  ?Take at onset of aura do not take more than once daily. #4 75 mg ODT samples given in office ?Will monitor if no improvement consider neuro referral ?

## 2021-06-15 DIAGNOSIS — M5412 Radiculopathy, cervical region: Secondary | ICD-10-CM | POA: Diagnosis not present

## 2021-06-16 ENCOUNTER — Telehealth

## 2021-06-25 DIAGNOSIS — F319 Bipolar disorder, unspecified: Secondary | ICD-10-CM | POA: Diagnosis not present

## 2021-06-25 DIAGNOSIS — J45909 Unspecified asthma, uncomplicated: Secondary | ICD-10-CM

## 2021-06-25 DIAGNOSIS — E1159 Type 2 diabetes mellitus with other circulatory complications: Secondary | ICD-10-CM | POA: Diagnosis not present

## 2021-06-25 DIAGNOSIS — I10 Essential (primary) hypertension: Secondary | ICD-10-CM

## 2021-06-25 DIAGNOSIS — R69 Illness, unspecified: Secondary | ICD-10-CM | POA: Diagnosis not present

## 2021-06-25 DIAGNOSIS — Z7984 Long term (current) use of oral hypoglycemic drugs: Secondary | ICD-10-CM | POA: Diagnosis not present

## 2021-06-26 DIAGNOSIS — K219 Gastro-esophageal reflux disease without esophagitis: Secondary | ICD-10-CM | POA: Diagnosis not present

## 2021-06-26 DIAGNOSIS — E1136 Type 2 diabetes mellitus with diabetic cataract: Secondary | ICD-10-CM | POA: Diagnosis not present

## 2021-06-26 DIAGNOSIS — J449 Chronic obstructive pulmonary disease, unspecified: Secondary | ICD-10-CM | POA: Diagnosis not present

## 2021-06-26 DIAGNOSIS — Z008 Encounter for other general examination: Secondary | ICD-10-CM | POA: Diagnosis not present

## 2021-06-26 DIAGNOSIS — E1142 Type 2 diabetes mellitus with diabetic polyneuropathy: Secondary | ICD-10-CM | POA: Diagnosis not present

## 2021-06-26 DIAGNOSIS — I10 Essential (primary) hypertension: Secondary | ICD-10-CM | POA: Diagnosis not present

## 2021-06-26 DIAGNOSIS — E785 Hyperlipidemia, unspecified: Secondary | ICD-10-CM | POA: Diagnosis not present

## 2021-06-26 DIAGNOSIS — E1143 Type 2 diabetes mellitus with diabetic autonomic (poly)neuropathy: Secondary | ICD-10-CM | POA: Diagnosis not present

## 2021-06-26 DIAGNOSIS — R69 Illness, unspecified: Secondary | ICD-10-CM | POA: Diagnosis not present

## 2021-06-26 DIAGNOSIS — E1159 Type 2 diabetes mellitus with other circulatory complications: Secondary | ICD-10-CM | POA: Diagnosis not present

## 2021-06-26 DIAGNOSIS — R32 Unspecified urinary incontinence: Secondary | ICD-10-CM | POA: Diagnosis not present

## 2021-06-26 DIAGNOSIS — M1990 Unspecified osteoarthritis, unspecified site: Secondary | ICD-10-CM | POA: Diagnosis not present

## 2021-07-10 ENCOUNTER — Telehealth: Payer: Self-pay

## 2021-07-10 DIAGNOSIS — G43109 Migraine with aura, not intractable, without status migrainosus: Secondary | ICD-10-CM

## 2021-07-10 DIAGNOSIS — J301 Allergic rhinitis due to pollen: Secondary | ICD-10-CM

## 2021-07-10 NOTE — Progress Notes (Signed)
Chronic Care Management Pharmacy Assistant   Name: Joanna Bishop  MRN: [id number] DOB: 1955/10/18  Reason for Encounter: Medication Review/Medication Coordination Call for [facility]  Recent office visits:  06/14/2021 Joanna Kirschner, PA-C (PCP Office Visit) for Hypertension- Started: Rimegepant Sulfate 1 mg daily PRN at onset of a migraine, No orders placed, Patient to follow-up in 2 months  Recent consult visits:  None ID  Hospital visits:  None in previous 6 months  Medications: Outpatient Encounter Medications as of 07/10/2021  Medication Sig   albuterol (VENTOLIN HFA) 108 (90 Base) MCG/ACT inhaler INHALE 1-2 PUFFS INTO THE LUNGS EVERY 4 TO 6 HOURS AS NEEDED FOR WHEEZING OR SHORTNESS OF BREATH.   baclofen (LIORESAL) 10 MG tablet Take 1 tablet (10 mg total) by mouth 2 (two) times daily as needed for muscle spasms.   blood glucose meter kit and supplies Dispense based on patient and insurance preference. Use daily as directed. 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   Dulaglutide (TRULICITY) 3 ZH/0.8MV SOPN Inject 3 mg as directed once a week.   glipiZIDE (GLUCOTROL XL) 10 MG 24 hr tablet TAKE ONE TABLET BY MOUTH EVERY MORNING with breakfast   glucose blood (ONETOUCH VERIO) test strip Use to check blood sugars once daily as instructed   losartan (COZAAR) 50 MG tablet Take 1 tablet (50 mg total) by mouth daily.   montelukast (SINGULAIR) 10 MG tablet Take 1 tablet (10 mg total) by mouth daily.   Multiple Vitamin (MULTIVITAMIN WITH MINERALS) TABS tablet Take 1 tablet by mouth daily. Centrum Silver Women's 50+   NON FORMULARY Take 1 capsule by mouth in the morning and at bedtime. AZO Bladder Control Supplement   OneTouch Delica Lancets 78I MISC Use to check blood sugars once daily as directed.   pantoprazole (PROTONIX) 40 MG tablet Take 40 mg by mouth 2 (two) times daily.   Rimegepant Sulfate (NURTEC) 75 MG TBDP Take 1 mg by mouth daily as needed. At onset of migraine/aura   rosuvastatin  (CRESTOR) 5 MG tablet Take 1 tablet (5 mg total) by mouth daily.   traMADol (ULTRAM) 50 MG tablet Take 1 tablet (50 mg total) by mouth every 6 (six) hours as needed.   umeclidinium bromide (INCRUSE ELLIPTA) 62.5 MCG/ACT AEPB Inhale 1 puff into the lungs daily.   [DISCONTINUED] XARELTO 10 MG TABS tablet Take 10 mg by mouth every morning.   No facility-administered encounter medications on file as of 07/10/2021.   Care Gaps: COVID-19 Vaccine HIV Screening Zoster Vaccines Diabetic Foot Exam Tetanus/TDAP Diabetic Eye Exam Mammogram Fecal DNA (Cologuard) PAP Smear Mammogram  Star Rating Drugs: Losartan 50 mg last filled on 06/13/2021 for a 30-Day Supply with [facility] Glipizide 10 mg last filled on 06/13/2021 for a 30-Day Supply with [facility] Rosuvastatin 5 mg last filled on 06/13/2021 for a 30-Day supply with [facility] Trulicity 3 mg patient receives this medication from the [place] so not sure when the last fill date was  BP Readings from Last 3 Encounters:  06/14/21 120/70  05/12/21 138/83  05/02/21 (!) 165/93    Lab Results  Component Value Date   HGBA1C 13.8 (A) 01/12/2021    Patient obtains medications through Vials  30 Days   Last adherence delivery included:  Losartan 50 mg 1 tablet daily (Breakfast) Albuterol 108 Inhaler inhale 1-2 puffs every every 4-6 hours prn for wheezing or shortness of breath Glipizide 10 mg 1 tablet daily (Breakfast) Montelukast 10 mg 1 tablet daily (Bedtime) Rosuvastatin 5 mg 1  tablet daily (Breakfast)  Patient declined medications last month: Trulicity Inject 1.5 mg into the skin once a week- Patient is getting this medication through [facility] Increase Ellipta 62.5 mcg Inhale 1 puff into lungs daily-Patient is getting this medication through [facility]  Patient is due for next adherence delivery on: 07/20/2021 (Thursday) 2nd Route.  Called patient and reviewed medications and coordinated delivery.  This delivery to  include: Losartan 50 mg 1 tablet daily (Breakfast) Albuterol 108 Inhaler inhale 1-2 puffs every every 4-6 hours prn for wheezing or shortness of breath Glipizide 10 mg 1 tablet daily (Breakfast) Montelukast 10 mg 1 tablet daily (Bedtime) Rosuvastatin 5 mg 1 tablet daily (Breakfast) Onetouch Verio Test Strips Onetouch Lancets Oxybutynin 10 mg 1 tablet daily (Bedtime)-patient requesting to restart medication  Patient declined the following medications: Trulicity Inject 1.5 mg into the skin once a week- Patient is getting this medication through [facility] Increase Ellipta 62.5 mcg Inhale 1 puff into lungs daily-Patient is getting this medication through [facility] Nurtec 75 mg 1 tablet as needed at onset of migraine-Patient would like this medication sent to   [facility] due to copayment  Patient needs refills for Montelukast 10 mg and Rosuvastatin 5 mg these are PCP medications and can be refilled by CPP.  Confirmed delivery date of 07/20/2021 2nd Route, advised patient that pharmacy will contact them the morning of delivery.  I spoke to the patient and she is requesting that her Nurtec 75 mg be sent to [facility] due to copayment. CPP has been notified of this request and he stated he can send the script in for her. Patient is also requesting to be started back on Oxybutynin 10 mg daily at bedtime for her incontinence. Per patient she stopped the medication as she wasn't having incontinence issues anymore, but now realizes it was due to the medication. Patient would like this to be added to the upcoming delivery unless there is a copayment then she would like the prescription to be sent to [facility]. CPP has been notified of this request.  Patient has a telephone follow-up appointment with Junius Argyle, CPP on 07/17/2021 @ 0915.  Lynann Bologna, CPA/CMA Clinical Pharmacist Assistant Phone: [phone]

## 2021-07-11 MED ORDER — MONTELUKAST SODIUM 10 MG PO TABS: 10.0000 mg | ORAL_TABLET | Freq: Every day | ORAL | 1 refills | Status: DC

## 2021-07-11 MED ORDER — NURTEC 75 MG PO TBDP: 1.0000 mg | ORAL_TABLET | Freq: Every day | ORAL | 3 refills | Status: DC | PRN

## 2021-07-11 MED ORDER — ROSUVASTATIN CALCIUM 5 MG PO TABS: 5.0000 mg | ORAL_TABLET | Freq: Every day | ORAL | 1 refills | Status: DC

## 2021-07-11 MED ORDER — OXYBUTYNIN CHLORIDE ER 10 MG PO TB24: 10.0000 mg | ORAL_TABLET | Freq: Every day | ORAL | 1 refills | Status: DC

## 2021-07-11 NOTE — Addendum Note (Signed)
Addended by: Julious Payer A on: 07/11/2021 08:58 AM ? ? Modules accepted: Orders ? ?

## 2021-07-14 ENCOUNTER — Telehealth: Payer: Self-pay

## 2021-07-14 NOTE — Progress Notes (Signed)
    Chronic Care Management Pharmacy Assistant   Name: Joanna Bishop  MRN: [id number] DOB: December 10, 1955  Patient called to be reminded of her telephone appointment with Angelena Sole, CPP on 07/17/2021 @0915 .  Patient aware of appointment date, time, and type of appointment (either telephone or in person). Patient aware to have/bring all medications, supplements, blood pressure and/or blood sugar logs to visit.  Star Rating Drug: Losartan 50 mg last filled on 06/13/2021 for a 30-Day Supply with [facility] Glipizide 10 mg last filled on 06/13/2021 for a 30-Day Supply with [facility] Rosuvastatin 5 mg last filled on 06/13/2021 for a 30-Day supply with [facility] Trulicity 3 mg patient receives this medication from the 06/15/2021 so not sure when the last fill date was  Any gaps in medications fill history? None  Care Gaps: COVID-19 Vaccine HIV Screening Zoster Vaccines Diabetic Foot Exam Tetanus/TDAP Diabetic Eye Exam Mammogram Fecal DNA (Cologuard) PAP Smear Mammogram   [place], CPA/CMA Clinical Pharmacist Assistant Phone: [phone]

## 2021-07-17 ENCOUNTER — Ambulatory Visit

## 2021-07-17 DIAGNOSIS — G43109 Migraine with aura, not intractable, without status migrainosus: Secondary | ICD-10-CM

## 2021-07-17 DIAGNOSIS — E1165 Type 2 diabetes mellitus with hyperglycemia: Secondary | ICD-10-CM

## 2021-07-17 NOTE — Progress Notes (Signed)
Chronic Care Management Pharmacy Note  07/25/2021 Name:  Joanna Bishop MRN:  [id number] DOB:  30-Oct-1955  Summary: Patient presents for CCM follow-up. Multiple deaths in the family. Energy improved.   -She continues to struggle with migraines. Continues to have headaches 2-3 times weekly and requires treatment medication 5x in past month.   -Patient at high risk of sleep apnea (STOP BANG assessment 6/8) with poor sleep. Last sleep study was 13-14 years ago.   Recommendations/Changes made from today's visit: Continue current medications  Recommend the following changes at PCP follow-up:   -Recommend topiramate 25 mg daily OR propanolol 40 mg daily for migraine prevention.  -Recommend sleep study for evaluation of potential sleep apnea.   -Recommend rechecking fasting lipid panel, A1c.   Plan: CPP follow-up 2 months  Subjective: Joanna Bishop is an 66 y.o. year old female who is a primary patient of [name], [name], [place].  The CCM team was consulted for assistance with disease management and care coordination needs.    Engaged with patient by telephone for follow up visit in response to provider referral for pharmacy case management and/or care coordination services.   Consent to Services:  The patient was given information about Chronic Care Management services, agreed to services, and gave verbal consent prior to initiation of services.  Please see initial visit note for detailed documentation.   Patient Care Team: [name], PA-C as PCP - General (Physician Assistant) Ocie Doyne, [place] (Optometry) Germaine Pomfret, [facility] [address] (Pharmacist)  Recent office visits: 05/12/21: Patient presented to [name], PA-C for follow-up. Aspirin stopped.   05/02/21: Patient presented to [name], PA-C for follow-up. Trulicity 3 mg weekly, losartan 50 mg daiyl started.   Recent consult visits: None noted.  Hospital visits: 3/10-3/13/23: Patient hospitalized for GI bleed.   03/09/21: Patient presented to ED due to abdominal pain.   Objective:  Lab Results  Component Value Date   CREATININE 1.09 (H) 05/02/2021   BUN 24 05/02/2021   GFRNONAA 60 (L) 03/09/2021   GFRAA >60 06/25/2019   NA 138 05/02/2021   K 4.4 05/02/2021   CALCIUM 10.1 05/02/2021   CO2 22 05/02/2021   GLUCOSE 304 (H) 05/02/2021    Lab Results  Component Value Date/Time   HGBA1C 13.8 (A) 01/12/2021 01:33 PM   HGBA1C 10.4 (A) 08/28/2019 06:38 PM   HGBA1C 8.8 (H) 03/12/2018 09:32 AM   HGBA1C 6.4 (H) 12/20/2017 09:24 AM    Last diabetic Eye exam:  Lab Results  Component Value Date/Time   HMDIABEYEEXA No Retinopathy 04/04/2020 12:00 AM    Last diabetic Foot exam: No results found for: HMDIABFOOTEX   Lab Results  Component Value Date   CHOL 170 01/26/2021   HDL 44 01/26/2021   LDLCALC 82 01/26/2021   TRIG 267 (H) 01/26/2021   CHOLHDL 3.9 01/26/2021       Latest Ref Rng & Units 03/09/2021    6:49 PM 01/26/2021   10:18 AM 03/12/2018    9:32 AM  Hepatic Function  Total Protein 6.5 - 8.1 g/dL 7.4   6.5   6.5    Albumin 3.5 - 5.0 g/dL 4.5   4.5   4.2    AST 15 - 41 U/L 30   30   34    ALT 0 - 44 U/L 33   38   45    Alk Phosphatase 38 - 126 U/L 90   141   95    Total Bilirubin 0.3 -  1.2 mg/dL 1.2   0.7   0.4      Lab Results  Component Value Date/Time   TSH 2.510 10/14/2015 11:41 AM   TSH 3.030 07/12/2015 07:23 PM   TSH 1.730 09/13/2014 10:26 AM       Latest Ref Rng & Units 05/02/2021    2:33 PM 03/09/2021    6:49 PM 06/25/2019    6:02 PM  CBC  WBC 3.4 - 10.8 x10E3/uL 11.8   11.4   9.5    Hemoglobin 11.1 - 15.9 g/dL 13.8   13.5   14.2    Hematocrit 34.0 - 46.6 % 41.1   39.2   41.6    Platelets 150 - 450 x10E3/uL 216   257   195      Lab Results  Component Value Date/Time   VD25OH 29.0 (L) 10/14/2015 11:41 AM    Clinical ASCVD: No  The 10-year ASCVD risk score (Arnett DK, et al., 2019) is: 11.3%   Values used to calculate the score:     Age: 60 years     Sex:  Female     Is Non-Hispanic African American: No     Diabetic: Yes     Tobacco smoker: No     Systolic Blood Pressure: 314 mmHg     Is BP treated: Yes     HDL Cholesterol: 44 mg/dL     Total Cholesterol: 170 mg/dL       05/02/2021    1:28 PM 03/08/2021    9:05 AM 01/12/2021    1:38 PM  Depression screen PHQ 2/9  Decreased Interest 3 0 2  Down, Depressed, Hopeless 3 0 2  PHQ - 2 Score 6 0 4  Altered sleeping 3 0 3  Tired, decreased energy 3 0 3  Change in appetite 3 0 2  Feeling bad or failure about yourself  3 0 2  Trouble concentrating 3 0 3  Moving slowly or fidgety/restless 3 0 2  Suicidal thoughts 1 0 1  PHQ-9 Score 25 0 20  Difficult doing work/chores Extremely dIfficult Not difficult at all Very difficult    Social History   Tobacco Use  Smoking Status Never  Smokeless Tobacco Never   BP Readings from Last 3 Encounters:  06/14/21 120/70  05/12/21 138/83  05/02/21 (!) 165/93   Pulse Readings from Last 3 Encounters:  06/14/21 (!) 111  05/12/21 80  05/02/21 (!) 113   Wt Readings from Last 3 Encounters:  06/14/21 213 lb 3.2 oz (96.7 kg)  05/12/21 214 lb (97.1 kg)  05/02/21 220 lb 4.8 oz (99.9 kg)   BMI Readings from Last 3 Encounters:  06/14/21 36.60 kg/m  05/12/21 36.73 kg/m  05/02/21 37.81 kg/m    Assessment/Interventions: Review of patient past medical history, allergies, medications, health status, including review of consultants reports, laboratory and other test data, was performed as part of comprehensive evaluation and provision of chronic care management services.   SDOH:  (Social Determinants of Health) assessments and interventions performed: Yes      SDOH Screenings   Alcohol Screen: Low Risk    Last Alcohol Screening Score (AUDIT): 0  Depression (PHQ2-9): Medium Risk   PHQ-2 Score: 25  Financial Resource Strain: Low Risk    Difficulty of Paying Living Expenses: Not hard at all  Food Insecurity: No Food Insecurity   Worried About  Charity fundraiser in the Last Year: Never true   [place] in the Last Year:  Never true  Housing: Low Risk    Last Housing Risk Score: 0  Physical Activity: Insufficiently Active   Days of Exercise per Week: 4 days   Minutes of Exercise per Session: 10 min  Social Connections: Socially Isolated   Frequency of Communication with Friends and Family: More than three times a week   Frequency of Social Gatherings with Friends and Family: Twice a week   Attends Religious Services: Never   Marine scientist or Organizations: No   Attends Archivist Meetings: Never   Marital Status: Widowed  Stress: No Stress Concern Present   Feeling of Stress : Not at all  Tobacco Use: Low Risk    Smoking Tobacco Use: Never   Smokeless Tobacco Use: Never   Passive Exposure: Not on file  Transportation Needs: No Transportation Needs   Lack of Transportation (Medical): No   Lack of Transportation (Non-Medical): No    CCM Care Plan  Allergies  Allergen Reactions   Penicillins Swelling    Medications Reviewed Today     Reviewed by Emelia Loron (Physician Assistant Certified) on 07/37/10 at 1201  Med List Status: <None>   Medication Order Taking? Sig Documenting Provider Last Dose Status Informant  albuterol (VENTOLIN HFA) 108 (90 Base) MCG/ACT inhaler [id number] Yes INHALE 1-2 PUFFS INTO THE LUNGS EVERY 4 TO 6 HOURS AS NEEDED FOR WHEEZING OR SHORTNESS OF BREATH. [name], PA-C Taking Active   baclofen (LIORESAL) 10 MG tablet [id number] Yes Take 1 tablet (10 mg total) by mouth 2 (two) times daily as needed for muscle spasms. Danton Clap, PA-C Taking Active   blood glucose meter kit and supplies [id number] Yes Dispense based on patient and insurance preference. Use daily as directed. 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 Virginia Crews, MD Taking Active   Dulaglutide (TRULICITY) 3 IR/6.7EL [place] Aid [id number] Yes Inject 3 mg as directed once a week. [name], PA-C Taking  Active   glipiZIDE (GLUCOTROL XL) 10 MG 24 hr tablet [id number] Yes TAKE ONE TABLET BY MOUTH EVERY MORNING with breakfast [name], PA-C Taking Active   glucose blood (ONETOUCH VERIO) test strip [id number] Yes Use to check blood sugars once daily as instructed Bacigalupo, Dionne Bucy, MD Taking Active   losartan (COZAAR) 50 MG tablet [id number] Yes Take 1 tablet (50 mg total) by mouth daily. [name], PA-C Taking Active   montelukast (SINGULAIR) 10 MG tablet [id number] Yes Take 1 tablet (10 mg total) by mouth daily. Virginia Crews, MD Taking Active   Multiple Vitamin (MULTIVITAMIN WITH MINERALS) TABS tablet [id number] Yes Take 1 tablet by mouth daily. Centrum Silver Women's 50+ [provider] Taking Active   NON FORMULARY [id number] Yes Take 1 capsule by mouth in the morning and at bedtime. AZO Bladder Control Supplement [provider] Taking Active   OneTouch Delica Lancets 97Q MISC [id number] Yes Use to check blood sugars once daily as directed. Virginia Crews, MD Taking Active   pantoprazole (PROTONIX) 40 MG tablet [id number] Yes Take 40 mg by mouth 2 (two) times daily. [provider] Taking Active   Rimegepant Sulfate (NURTEC) 75 MG TBDP [id number] Yes Take 1 mg by mouth daily as needed. At onset of migraine/aura Emelia Loron  Active   rosuvastatin (CRESTOR) 5 MG tablet [id number] Yes Take 1 tablet (5 mg total) by mouth daily. Virginia Crews, MD Taking Active   traMADol Veatrice [place]) 50 MG tablet [id number] Yes Take 1 tablet (50 mg total) by mouth every 6 (six)  hours as needed. [name], PA-C Taking Active   umeclidinium bromide (INCRUSE ELLIPTA) 62.5 MCG/ACT AEPB [id number] Yes Inhale 1 puff into the lungs daily. Virginia Crews, MD Taking Active     Discontinued 06/25/19 1840             Patient Active Problem List   Diagnosis Date Noted   Migraine with aura and without status migrainosus, not intractable 06/14/2021   Peptic  ulcer disease with hemorrhage 05/12/2021   Anemia due to acute blood loss 05/12/2021   Hypertension associated with diabetes ([place]) 05/02/2021   Chronic 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 05/02/2021   OAB (overactive bladder) 01/12/2021   History of total knee arthroplasty 05/09/2020   Morbid obesity ([place]) 08/28/2019   Stiffness of right knee 06/18/2018   Type 2 diabetes mellitus with hyperglycemia, without long-term current use of insulin ([place]) 10/17/2015   Carpal tunnel syndrome of left wrist 10/14/2015   Bipolar depression ([place]) 07/12/2015   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   Asthmatic bronchitis 08/02/2014   Hyperlipidemia associated with type 2 diabetes mellitus ([place]) 08/02/2014   History of suicidal ideation 08/02/2014   Avitaminosis D 08/02/2014   Generalized hyperhidrosis 08/02/2014   Airway hyperreactivity 08/02/2014   Anxiety disorder 08/02/2014   Insomnia 08/02/2014   Major depressive disorder, single episode 08/02/2014   Shortness of breath 08/05/2009    Immunization History  Administered Date(s) Administered   Influenza,inj,Quad PF,6+ Mos 01/06/2018, 12/12/2018, 05/02/2021   Pneumococcal Polysaccharide-23 07/30/2012   Tdap 07/28/2009    Conditions to be addressed/monitored:  Hyperlipidemia, Diabetes, Asthma, Overactive Bladder, and Bipolar Disorder, Chronic Pain, Chronic Migraines  Care Plan : General Pharmacy (Adult)  Updates made by Germaine Pomfret, [place] since 07/25/2021 12:00 AM     Problem: Hyperlipidemia, Diabetes, Asthma, Overactive Bladder, and Bipolar Disorder, Chronic Pain, Chronic Migraines   Priority: High     Long-Range Goal: Patient-Specific Goal   Start Date: 01/23/2021  Expected End Date: 01/23/2022  This Visit's Progress: On track  Recent Progress: On track  Priority: High  Note:   Current Barriers:  Unable to independently afford treatment regimen Unable to achieve control of diabetes   Pharmacist Clinical  Goal(s):  Patient will verbalize ability to afford treatment regimen achieve control of Diabetes as evidenced by A1c less than 8% through collaboration with PharmD and provider.   Interventions: 1:1 collaboration with [name], PA-C regarding development and update of comprehensive plan of care as evidenced by provider attestation and co-signature Inter-disciplinary care team collaboration (see longitudinal plan of care) Comprehensive medication review performed; medication list updated in electronic medical record  Hypertension (BP goal <130/80) -Controlled: Not addressed during this visit. -Current treatment: Losartan 50 mg daily: Appropriate, Query effective  -Medications previously tried: NA  -Current home readings (utilizes wrist monitor):  Did not have readings with her today.  -Denies hypotensive/hypertensive symptoms Continue current medications  Hyperlipidemia: (LDL goal < 70) -Uncontrolled: Not addressed during this visit. -Current treatment: Rosuvastatin 5 mg daily (AM) -Medications previously tried: NA  -Recommended to continue current medication  Diabetes (A1c goal <8%) -Controlled, but in the setting of blood transfusion.  -Current medications: Glipizide XL 10 mg daily: Appropriate, Query effective  Trulicity 3 mg weekly: Appropriate, Query effective -Medications previously tried: Metformin (sluggish, stomach pain),   -Current home glucose readings fasting glucose: 130-145, one instance of 185 (Pasta [employer] Donuts)    -Reports hypoglycemia symptoms:   -Current meal patterns: Appetite significantly reduced. Struggles with sweet tooth.  Endorses indulging in sweets once monthly. Pastries  -Current exercise: Limited by leg pain following knee replacement/femur fracture. Chair exercises 3 times weekly.  -Tolerating Trulicity 3 mg dose well. Feels like glipizide "bottoms me out." Hesitant to change regimen given improvement in blood sugars.  -Continue current  medications  Asthma (Goal: control symptoms and prevent exacerbations) -Controlled: Not addressed during this visit. -Current treatment  Ventolin HFA 1-2 puffs every 4-6 hours as needed Incruse 1 puff daily  Montelukast 10 mg daily  -Medications previously tried: NA  -Exacerbations requiring treatment in last 6 months: None -Patient denies consistent use of maintenance inhaler.  -Frequency of rescue inhaler use: 1-2 times weekly -Counseled on Benefits of consistent maintenance inhaler use -Recommended to continue current medication  Bipolar Disorder (Goal: Achieve symptom remission) -Not ideally controlled: Not addressed during this visit. -Current treatment: None  -Medications previously tried/failed: Divalproex, Benztropine, Wellbutrin, Citalopram, Sertraline  -Counseled on importance of keeping upcoming psychiatry appointment   Migraines (Goal: Prevent migraines) -Not ideally controlled -Current treatment  Rimegepant 75 mg daily as needed  -Medications previously tried: Divalproex, Excedrin (GI Bleed) -She continues to struggle with migraines. Continues to have headaches 2-3 times weekly and requires treatment medication 5x in past month -Recommend topiramate 25 mg daily OR propanolol 40 mg daily for migraine prevention.   Chronic Pain (Goal: Minimize symptoms) -Controlled: Not addressed during this visit. -Managed by Carlynn Spry, PA-C (Ortho) -Current treatment  Baclofen 10 mg twice daily as needed  Boswellia extract 500 mg twice daily  Tramadol 50 mg q6h PRN  -Medications previously tried: NA -Patient also reports CBD gummy use.  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  -Requires rare use of tramadol   -Recommended to continue current medication  Peptic Ulcer Disease (Goal: Prevent GI bleeds) -Controlled -Current treatment  Pantoprazole 40 mg twice daily  -Medications previously tried: NA  -Recommended to continue  current medication  Patient Goals/Self-Care Activities Patient will:  - check glucose daily before breakfast, document, and provide at future appointments  Follow Up Plan: Telephone follow up appointment with care management team member scheduled for:  08/28/21 at 3:00 PM    Medication Assistance: None required.  Patient affirms current coverage meets needs. Patient receives Incruse, Musician through [employer].   Compliance/Adherence/Medication fill history: Care Gaps: Covid Vaccine  HIV Screening  Shingrix  Pneumonia  Foot Exam  Influenza   Star-Rating Drugs: Rosuvastatin 5 mg: LF 05/16/21 for 30-DS from [place]  Patient's preferred pharmacy is:  [facility] - [place], [place] - [address] [place] [address] Phone: [phone] Fax: [phone]  [facility] [place], [place] [address] [facility] [address] [place] [address] Phone: [phone] Fax: [phone]  [id number] [place] [address] [place]Phone: [phone]: [phone] Uses pill box? Yes Pt endorses 100% compliance  Patient decided to: Utilize [facility] for medication synchronization, packaging and delivery  Care Plan and Follow Up Patient Decision:  Patient agrees to Care Plan and Follow-up.  Plan: Telephone follow up appointment with care management team member scheduled for:  08/28/21 at 3:00 PM  AJunius Argyle PharmD, BPara [phone]

## 2021-07-25 NOTE — Patient Instructions (Signed)
Visit Information It was great speaking with you today!  Please let me know if you have any questions about our visit.   Goals Addressed   None     Patient Care Plan: General Pharmacy (Adult)     Problem Identified: Hyperlipidemia, Diabetes, Asthma, Overactive Bladder, and Bipolar Disorder, Chronic Pain, Chronic Migraines   Priority: High     Long-Range Goal: Patient-Specific Goal   Start Date: 01/23/2021  Expected End Date: 01/23/2022  This Visit's Progress: On track  Recent Progress: On track  Priority: High  Note:   Current Barriers:  Unable to independently afford treatment regimen Unable to achieve control of diabetes   Pharmacist Clinical Goal(s):  Patient will verbalize ability to afford treatment regimen achieve control of Diabetes as evidenced by A1c less than 8% through collaboration with PharmD and provider.   Interventions: 1:1 collaboration with Alfredia Ferguson, PA-C regarding development and update of comprehensive plan of care as evidenced by provider attestation and co-signature Inter-disciplinary care team collaboration (see longitudinal plan of care) Comprehensive medication review performed; medication list updated in electronic medical record  Hypertension (BP goal <130/80) -Controlled: Not addressed during this visit. -Current treatment: Losartan 50 mg daily: Appropriate, Query effective  -Medications previously tried: NA  -Current home readings (utilizes wrist monitor):  Did not have readings with her today.  -Denies hypotensive/hypertensive symptoms Continue current medications  Hyperlipidemia: (LDL goal < 70) -Uncontrolled: Not addressed during this visit. -Current treatment: Rosuvastatin 5 mg daily (AM) -Medications previously tried: NA  -Recommended to continue current medication  Diabetes (A1c goal <8%) -Controlled, but in the setting of blood transfusion.  -Current medications: Glipizide XL 10 mg daily: Appropriate, Query effective   Trulicity 3 mg weekly: Appropriate, Query effective -Medications previously tried: Metformin (sluggish, stomach pain),   -Current home glucose readings fasting glucose: 130-145, one instance of 185 ([employer])    -Reports hypoglycemia symptoms:   -Current meal patterns: Appetite significantly reduced. Struggles with sweet tooth. Endorses indulging in sweets once monthly. Pastries  -Current exercise: Limited by leg pain following knee replacement/femur fracture. Chair exercises 3 times weekly.  -Tolerating Trulicity 3 mg dose well. Feels like glipizide "bottoms me out." Hesitant to change regimen given improvement in blood sugars.  -Continue current medications  Asthma (Goal: control symptoms and prevent exacerbations) -Controlled: Not addressed during this visit. -Current treatment  Ventolin HFA 1-2 puffs every 4-6 hours as needed Incruse 1 puff daily  Montelukast 10 mg daily  -Medications previously tried: NA  -Exacerbations requiring treatment in last 6 months: None -Patient denies consistent use of maintenance inhaler.  -Frequency of rescue inhaler use: 1-2 times weekly -Counseled on Benefits of consistent maintenance inhaler use -Recommended to continue current medication  Bipolar Disorder (Goal: Achieve symptom remission) -Not ideally controlled: Not addressed during this visit. -Current treatment: None  -Medications previously tried/failed: Divalproex, Benztropine, Wellbutrin, Citalopram, Sertraline  -Counseled on importance of keeping upcoming psychiatry appointment   Migraines (Goal: Prevent migraines) -Not ideally controlled -Current treatment  Rimegepant 75 mg daily as needed  -Medications previously tried: Divalproex, Excedrin (GI Bleed) -She continues to struggle with migraines. Continues to have headaches 2-3 times weekly and requires treatment medication 5x in past month -Recommend topiramate 25 mg daily OR propanolol 40 mg daily for migraine prevention.    Chronic Pain (Goal: Minimize symptoms) -Controlled: Not addressed during this visit. -Managed by Altamese Cabal, PA-C (Ortho) -Current treatment  Baclofen 10 mg twice daily as needed  Boswellia extract 500 mg twice daily  Tramadol  50 mg q6h PRN  -Medications previously tried: NA -Patient also reports CBD gummy use.  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  -Requires rare use of tramadol   -Recommended to continue current medication  Peptic Ulcer Disease (Goal: Prevent GI bleeds) -Controlled -Current treatment  Pantoprazole 40 mg twice daily  -Medications previously tried: NA  -Recommended to continue current medication  Patient Goals/Self-Care Activities Patient will:  - check glucose daily before breakfast, document, and provide at future appointments  Follow Up Plan: Telephone follow up appointment with care management team member scheduled for:  08/28/21 at 3:00 PM      Patient agreed to services and verbal consent obtained.   Patient verbalizes understanding of instructions and care plan provided today and agrees to view in MyChart. Active MyChart status and patient understanding of how to access instructions and care plan via MyChart confirmed with patient.     Angelena Sole, PharmD, Patsy Baltimore, CPP  Clinical Pharmacist Practitioner  [facility] [phone]

## 2021-08-01 NOTE — Progress Notes (Deleted)
I,Sha'taria Alaney Witter,acting as a Education administrator for [employer], PA-C.,have documented all relevant documentation on the behalf of [name], PA-C,as directed by  [name], PA-C while in the presence of [name], PA-C.  Established patient visit   Patient: Joanna Bishop   DOB: 1955-03-31   66 y.o. Female  MRN: [id number] Visit Date: 08/02/2021  Today's healthcare provider: [name], PA-C   No chief complaint on file.  Subjective    HPI  Diabetes Mellitus Type II, Follow-up  Lab Results  Component Value Date   HGBA1C 13.8 (A) 01/12/2021   HGBA1C 10.4 (A) 08/28/2019   HGBA1C 7.9 (A) 12/12/2018   Wt Readings from Last 3 Encounters:  06/14/21 213 lb 3.2 oz (96.7 kg)  05/12/21 214 lb (97.1 kg)  05/02/21 220 lb 4.8 oz (99.9 kg)   Last seen for diabetes 3 months ago.  Management since then includes increase trulicity to 3 mg and continue glipizide 10 mg. She reports {excellent/good/fair/poor:19665} compliance with treatment. She {is/is not:21021397} having side effects. {document side effects if present:1} Symptoms: {Yes/No:20286} fatigue {Yes/No:20286} foot ulcerations  {Yes/No:20286} appetite changes {Yes/No:20286} nausea  {Yes/No:20286} paresthesia of the feet  {Yes/No:20286} polydipsia  {Yes/No:20286} polyuria {Yes/No:20286} visual disturbances   {Yes/No:20286} vomiting     Home blood sugar records: {diabetes glucometry results:16657}  Episodes of hypoglycemia? {Yes/No:20286} {enter symptoms and frequency of symptoms if yes:1}   Current insulin regiment: {enter 'none' or type of insulin and number of units taken with each dose of each insulin formulation that the patient is taking:1} Most Recent Eye Exam: *** {Current exercise:16438:::1} {Current diet habits:16563:::1}  Pertinent Labs: Lab Results  Component Value Date   CHOL 170 01/26/2021   HDL 44 01/26/2021   LDLCALC 82 01/26/2021   TRIG 267 (H) 01/26/2021   CHOLHDL 3.9 01/26/2021   Lab  Results  Component Value Date   NA 138 05/02/2021   K 4.4 05/02/2021   CREATININE 1.09 (H) 05/02/2021   EGFR 57 (L) 05/02/2021   LABMICR 146.2 12/20/2017     ---------------------------------------------------------------------------------------------------  Hypertension, follow-up  BP Readings from Last 3 Encounters:  06/14/21 120/70  05/12/21 138/83  05/02/21 (!) 165/93   Wt Readings from Last 3 Encounters:  06/14/21 213 lb 3.2 oz (96.7 kg)  05/12/21 214 lb (97.1 kg)  05/02/21 220 lb 4.8 oz (99.9 kg)     She was last seen for hypertension 6 weeks ago.  BP at that visit was 120/70. Management since that visit includes continue losartan.  She reports {excellent/good/fair/poor:19665} compliance with treatment. She {is/is not:9024} having side effects. {document side effects if present:1} She is following a {diet:21022986} diet. She {is/is not:9024} exercising. She {does/does not:200015} smoke.  Use of agents associated with hypertension: {bp agents assoc with hypertension:511::"none"}.   Outside blood pressures are {***enter patient reported home BP readings, or 'not being checked':1}. Symptoms: {Yes/No:20286} chest pain {Yes/No:20286} chest pressure  {Yes/No:20286} palpitations {Yes/No:20286} syncope  {Yes/No:20286} dyspnea {Yes/No:20286} orthopnea  {Yes/No:20286} paroxysmal nocturnal dyspnea {Yes/No:20286} lower extremity edema   Pertinent labs Lab Results  Component Value Date   CHOL 170 01/26/2021   HDL 44 01/26/2021   LDLCALC 82 01/26/2021   TRIG 267 (H) 01/26/2021   CHOLHDL 3.9 01/26/2021   Lab Results  Component Value Date   NA 138 05/02/2021   K 4.4 05/02/2021   CREATININE 1.09 (H) 05/02/2021   EGFR 57 (L) 05/02/2021   GLUCOSE 304 (H) 05/02/2021   TSH 2.510 10/14/2015     The 10-year ASCVD risk  score (Arnett DK, et al., 2019) is: 11.3%  ---------------------------------------------------------------------------------------------------    Medications: Outpatient Medications Prior to Visit  Medication Sig   albuterol (VENTOLIN HFA) 108 (90 Base) MCG/ACT inhaler INHALE 1-2 PUFFS INTO THE LUNGS EVERY 4 TO 6 HOURS AS NEEDED FOR WHEEZING OR SHORTNESS OF BREATH.   baclofen (LIORESAL) 10 MG tablet Take 1 tablet (10 mg total) by mouth 2 (two) times daily as needed for muscle spasms.   blood glucose meter kit and supplies Dispense based on patient and insurance preference. Use daily as directed. 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   Dulaglutide (TRULICITY) 3 BT/2.4EL SOPN Inject 3 mg as directed once a week.   glipiZIDE (GLUCOTROL XL) 10 MG 24 hr tablet TAKE ONE TABLET BY MOUTH EVERY MORNING with breakfast   glucose blood (ONETOUCH VERIO) test strip Use to check blood sugars once daily as instructed   losartan (COZAAR) 50 MG tablet Take 1 tablet (50 mg total) by mouth daily.   montelukast (SINGULAIR) 10 MG tablet Take 1 tablet (10 mg total) by mouth daily.   Multiple Vitamin (MULTIVITAMIN WITH MINERALS) TABS tablet Take 1 tablet by mouth daily. Centrum Silver Women's 50+   NON FORMULARY Take 1 capsule by mouth in the morning and at bedtime. AZO Bladder Control Supplement   OneTouch Delica Lancets 85T MISC Use to check blood sugars once daily as directed.   oxybutynin (DITROPAN XL) 10 MG 24 hr tablet Take 1 tablet (10 mg total) by mouth at bedtime.   pantoprazole (PROTONIX) 40 MG tablet Take 40 mg by mouth 2 (two) times daily.   Rimegepant Sulfate (NURTEC) 75 MG TBDP Take 1 mg by mouth daily as needed. At onset of migraine/aura   rosuvastatin (CRESTOR) 5 MG tablet Take 1 tablet (5 mg total) by mouth daily.   traMADol (ULTRAM) 50 MG tablet Take 1 tablet (50 mg total) by mouth every 6 (six) hours as needed.   umeclidinium bromide (INCRUSE ELLIPTA) 62.5 MCG/ACT AEPB Inhale 1 puff into the lungs daily.   No facility-administered medications prior to visit.    Review of Systems  {Labs  Heme  Chem  Endocrine  Serology  Results Review  (optional):23779}   Objective    There were no vitals taken for this visit. {Show previous vital signs (optional):23777}  Physical Exam  ***  No results found for any visits on 08/02/21.  Assessment & Plan     ***  No follow-ups on file.      {provider attestation***:1}   [name], PA-C  [facility] [phone] (phone) [phone] (fax)  [place]

## 2021-08-02 ENCOUNTER — Ambulatory Visit: Payer: Medicare HMO | Admitting: Physician Assistant

## 2021-08-02 DIAGNOSIS — E1165 Type 2 diabetes mellitus with hyperglycemia: Secondary | ICD-10-CM

## 2021-08-07 NOTE — Progress Notes (Deleted)
I,Sha'taria Sravya Grissom,acting as a Education administrator for [employer], PA-C.,have documented all relevant documentation on the behalf of [name], PA-C,as directed by  [name], PA-C while in the presence of [name], PA-C.   Established patient visit   Patient: Joanna Bishop   DOB: [birth year]   66 y.o. Female  MRN: [id number] Visit Date: 08/08/2021  Today's healthcare provider: [name], PA-C   No chief complaint on file.  Subjective    HPI  Hypertension, follow-up  BP Readings from Last 3 Encounters:  06/14/21 120/70  05/12/21 138/83  05/02/21 (!) 165/93   Wt Readings from Last 3 Encounters:  06/14/21 213 lb 3.2 oz (96.7 kg)  05/12/21 214 lb (97.1 kg)  05/02/21 220 lb 4.8 oz (99.9 kg)     She was last seen for hypertension 2 months ago.  BP at that visit was 120/70. Management since that visit includes continue losartan 50 mg.  She reports {excellent/good/fair/poor:19665} compliance with treatment. She {is/is not:9024} having side effects. {document side effects if present:1} She is following a {diet:21022986} diet. She {is/is not:9024} exercising. She {does/does not:200015} smoke.  Use of agents associated with hypertension: {bp agents assoc with hypertension:511::"none"}.   Outside blood pressures are {***enter patient reported home BP readings, or 'not being checked':1}. Symptoms: {Yes/No:20286} chest pain {Yes/No:20286} chest pressure  {Yes/No:20286} palpitations {Yes/No:20286} syncope  {Yes/No:20286} dyspnea {Yes/No:20286} orthopnea  {Yes/No:20286} paroxysmal nocturnal dyspnea {Yes/No:20286} lower extremity edema   Pertinent labs Lab Results  Component Value Date   CHOL 170 01/26/2021   HDL 44 01/26/2021   LDLCALC 82 01/26/2021   TRIG 267 (H) 01/26/2021   CHOLHDL 3.9 01/26/2021   Lab Results  Component Value Date   NA 138 05/02/2021   K 4.4 05/02/2021   CREATININE 1.09 (H) 05/02/2021   EGFR 57 (L) 05/02/2021   GLUCOSE 304 (H)  05/02/2021   TSH 2.510 10/14/2015     The 10-year ASCVD risk score (Arnett DK, et al., 2019) is: 11.3%  --------------------------------------------------------------------------------------------------- Diabetes Mellitus Type II, Follow-up  Lab Results  Component Value Date   HGBA1C 13.8 (A) 01/12/2021   HGBA1C 10.4 (A) 08/28/2019   HGBA1C 7.9 (A) 12/12/2018   Wt Readings from Last 3 Encounters:  06/14/21 213 lb 3.2 oz (96.7 kg)  05/12/21 214 lb (97.1 kg)  05/02/21 220 lb 4.8 oz (99.9 kg)   Last seen for diabetes 3 months ago.  Management since then includes   A1c improved to 8.0% Advised to increase trulicity to 3 mg and continue glipizide 10 mg.  Continue checking fasting BS  . She reports {excellent/good/fair/poor:19665} compliance with treatment. She {is/is not:21021397} having side effects. {document side effects if present:1} Symptoms: {Yes/No:20286} fatigue {Yes/No:20286} foot ulcerations  {Yes/No:20286} appetite changes {Yes/No:20286} nausea  {Yes/No:20286} paresthesia of the feet  {Yes/No:20286} polydipsia  {Yes/No:20286} polyuria {Yes/No:20286} visual disturbances   {Yes/No:20286} vomiting     Home blood sugar records: {diabetes glucometry results:16657}  Episodes of hypoglycemia? {Yes/No:20286} {enter symptoms and frequency of symptoms if yes:1}   Current insulin regiment: {enter 'none' or type of insulin and number of units taken with each dose of each insulin formulation that the patient is taking:1} Most Recent Eye Exam: *** {Current exercise:16438:::1} {Current diet habits:16563:::1}  Pertinent Labs: Lab Results  Component Value Date   CHOL 170 01/26/2021   HDL 44 01/26/2021   LDLCALC 82 01/26/2021   TRIG 267 (H) 01/26/2021   CHOLHDL 3.9 01/26/2021   Lab Results  Component Value Date   NA 138  05/02/2021   K 4.4 05/02/2021   CREATININE 1.09 (H) 05/02/2021   EGFR 57 (L) 05/02/2021   LABMICR 146.2 12/20/2017      ---------------------------------------------------------------------------------------------------   Medications: Outpatient Medications Prior to Visit  Medication Sig   albuterol (VENTOLIN HFA) 108 (90 Base) MCG/ACT inhaler INHALE 1-2 PUFFS INTO THE LUNGS EVERY 4 TO 6 HOURS AS NEEDED FOR WHEEZING OR SHORTNESS OF BREATH.   baclofen (LIORESAL) 10 MG tablet Take 1 tablet (10 mg total) by mouth 2 (two) times daily as needed for muscle spasms.   blood glucose meter kit and supplies Dispense based on patient and insurance preference. Use daily as directed. 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   Dulaglutide (TRULICITY) 3 IB/7.0WU SOPN Inject 3 mg as directed once a week.   glipiZIDE (GLUCOTROL XL) 10 MG 24 hr tablet TAKE ONE TABLET BY MOUTH EVERY MORNING with breakfast   glucose blood (ONETOUCH VERIO) test strip Use to check blood sugars once daily as instructed   losartan (COZAAR) 50 MG tablet Take 1 tablet (50 mg total) by mouth daily.   montelukast (SINGULAIR) 10 MG tablet Take 1 tablet (10 mg total) by mouth daily.   Multiple Vitamin (MULTIVITAMIN WITH MINERALS) TABS tablet Take 1 tablet by mouth daily. Centrum Silver Women's 50+   NON FORMULARY Take 1 capsule by mouth in the morning and at bedtime. AZO Bladder Control Supplement   OneTouch Delica Lancets 88B MISC Use to check blood sugars once daily as directed.   oxybutynin (DITROPAN XL) 10 MG 24 hr tablet Take 1 tablet (10 mg total) by mouth at bedtime.   pantoprazole (PROTONIX) 40 MG tablet Take 40 mg by mouth 2 (two) times daily.   Rimegepant Sulfate (NURTEC) 75 MG TBDP Take 1 mg by mouth daily as needed. At onset of migraine/aura   rosuvastatin (CRESTOR) 5 MG tablet Take 1 tablet (5 mg total) by mouth daily.   traMADol (ULTRAM) 50 MG tablet Take 1 tablet (50 mg total) by mouth every 6 (six) hours as needed.   umeclidinium bromide (INCRUSE ELLIPTA) 62.5 MCG/ACT AEPB Inhale 1 puff into the lungs daily.   No facility-administered medications prior to  visit.    Review of Systems  {Labs  Heme  Chem  Endocrine  Serology  Results Review (optional):23779}   Objective    There were no vitals taken for this visit. {Show previous vital signs (optional):23777}  Physical Exam  ***  No results found for any visits on 08/08/21.  Assessment & Plan     ***  No follow-ups on file.      {provider attestation***:1}   [name], PA-C  [facility] [phone] (phone) [phone] (fax)  [place]

## 2021-08-08 ENCOUNTER — Telehealth: Payer: Self-pay

## 2021-08-08 ENCOUNTER — Ambulatory Visit: Admitting: Physician Assistant

## 2021-08-08 NOTE — Progress Notes (Unsigned)
Chronic Care Management Pharmacy Assistant   Name: Joanna Bishop  MRN: [id number] DOB: 09-26-1955  Reason for Encounter: Medication Review/Medication Coordination Call for [facility]   Recent office visits:  None ID  Recent consult visits:  None ID  Hospital visits:  None in previous 6 months  Medications: Outpatient Encounter Medications as of 08/08/2021  Medication Sig   albuterol (VENTOLIN HFA) 108 (90 Base) MCG/ACT inhaler INHALE 1-2 PUFFS INTO THE LUNGS EVERY 4 TO 6 HOURS AS NEEDED FOR WHEEZING OR SHORTNESS OF BREATH.   baclofen (LIORESAL) 10 MG tablet Take 1 tablet (10 mg total) by mouth 2 (two) times daily as needed for muscle spasms.   blood glucose meter kit and supplies Dispense based on patient and insurance preference. Use daily as directed. 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   Dulaglutide (TRULICITY) 3 LE/7.5TZ SOPN Inject 3 mg as directed once a week.   glipiZIDE (GLUCOTROL XL) 10 MG 24 hr tablet TAKE ONE TABLET BY MOUTH EVERY MORNING with breakfast   glucose blood (ONETOUCH VERIO) test strip Use to check blood sugars once daily as instructed   losartan (COZAAR) 50 MG tablet Take 1 tablet (50 mg total) by mouth daily.   montelukast (SINGULAIR) 10 MG tablet Take 1 tablet (10 mg total) by mouth daily.   Multiple Vitamin (MULTIVITAMIN WITH MINERALS) TABS tablet Take 1 tablet by mouth daily. Centrum Silver Women's 50+   NON FORMULARY Take 1 capsule by mouth in the morning and at bedtime. AZO Bladder Control Supplement   OneTouch Delica Lancets 00F MISC Use to check blood sugars once daily as directed.   oxybutynin (DITROPAN XL) 10 MG 24 hr tablet Take 1 tablet (10 mg total) by mouth at bedtime.   pantoprazole (PROTONIX) 40 MG tablet Take 40 mg by mouth 2 (two) times daily.   Rimegepant Sulfate (NURTEC) 75 MG TBDP Take 1 mg by mouth daily as needed. At onset of migraine/aura   rosuvastatin (CRESTOR) 5 MG tablet Take 1 tablet (5 mg total) by mouth daily.   traMADol (ULTRAM) 50  MG tablet Take 1 tablet (50 mg total) by mouth every 6 (six) hours as needed.   umeclidinium bromide (INCRUSE ELLIPTA) 62.5 MCG/ACT AEPB Inhale 1 puff into the lungs daily.   [DISCONTINUED] XARELTO 10 MG TABS tablet Take 10 mg by mouth every morning.   No facility-administered encounter medications on file as of 08/08/2021.   Care Gaps: COVID-19 Vaccine HIV Screening Zoster Vaccines Diabetic Foot Exam Tetanus/TDAP Diabetic Eye Exam Mammogram Fecal DNA (Cologuard) PAP Smear Mammogram Hemoglobin A1C  Star Rating Drugs: Losartan 50 mg last filled on 07/13/2021 for a 30-Day Supply with [facility] Glipizide 10 mg last filled on 07/13/2021 for a 30-Day Supply with [facility] Rosuvastatin 5 mg last filled on 07/13/2021 for a 30-Day supply with [facility] Trulicity 3 mg patient receives this medication from the [place] so not sure when the last fill date was  BP Readings from Last 3 Encounters:  06/14/21 120/70  05/12/21 138/83  05/02/21 (!) 165/93    Lab Results  Component Value Date   HGBA1C 13.8 (A) 01/12/2021   Patient obtains medications through Vials  30 Days   Last adherence delivery included:  Losartan 50 mg 1 tablet daily (Breakfast) Albuterol 108 Inhaler inhale 1-2 puffs every every 4-6 hours prn for wheezing or shortness of breath Glipizide 10 mg 1 tablet daily (Breakfast) Montelukast 10 mg 1 tablet daily (Bedtime) Rosuvastatin 5 mg 1 tablet daily (Breakfast) [employer]  Strips Onetouch Lancets Oxybutynin 10 mg 1 tablet daily (Bedtime)  Patient declined the following medications last month: Trulicity Inject 1.5 mg into the skin once a week- Patient is getting this medication through [facility] Increase Ellipta 62.5 mcg Inhale 1 puff into lungs daily-Patient is getting this medication through [facility] Nurtec 75 mg 1 tablet as needed at onset of migraine-Patient would like this medication sent to   [facility] due to copayment  Patient is due for  next adherence delivery on: 08/18/2021 (Friday) 2nd Route after 3 pm.  Called patient and reviewed medications and coordinated delivery.  This delivery to include: Losartan 50 mg 1 tablet daily (Breakfast) Albuterol 108 Inhaler inhale 1-2 puffs every every 4-6 hours prn for wheezing or shortness of breath Glipizide 10 mg 1 tablet daily (Breakfast) Montelukast 10 mg 1 tablet daily (Bedtime) Rosuvastatin 5 mg 1 tablet daily (Breakfast) Onetouch Verio Test Strips Onetouch Lancets Oxybutynin 10 mg 1 tablet daily (Bedtime)  Patient needs refills for: No refills need for this month's delivery  Confirmed delivery date of 08/18/2021 2nd Route, advised patient that pharmacy will contact them the morning of delivery.  Patient has a follow-up telephone appointment with Junius Argyle, CPP on 08/28/2021 @ 1500.  06/13 LVM requesting patient to return my call  Lynann Bologna, [place] Pharmacist Assistant Phone: [phone]

## 2021-08-23 DIAGNOSIS — R922 Inconclusive mammogram: Secondary | ICD-10-CM | POA: Diagnosis not present

## 2021-08-23 DIAGNOSIS — R928 Other abnormal and inconclusive findings on diagnostic imaging of breast: Secondary | ICD-10-CM | POA: Diagnosis not present

## 2021-08-23 LAB — HM MAMMOGRAPHY

## 2021-08-28 ENCOUNTER — Ambulatory Visit: Payer: Medicare HMO

## 2021-08-28 DIAGNOSIS — G43109 Migraine with aura, not intractable, without status migrainosus: Secondary | ICD-10-CM

## 2021-08-28 DIAGNOSIS — J4540 Moderate persistent asthma, uncomplicated: Secondary | ICD-10-CM

## 2021-08-28 DIAGNOSIS — E1165 Type 2 diabetes mellitus with hyperglycemia: Secondary | ICD-10-CM

## 2021-08-28 MED ORDER — BUDESONIDE-FORMOTEROL FUMARATE 80-4.5 MCG/ACT IN AERO: 2.0000 | INHALATION_SPRAY | Freq: Two times a day (BID) | RESPIRATORY_TRACT | 3 refills | Status: DC

## 2021-08-28 NOTE — Progress Notes (Unsigned)
Chronic Care Management Pharmacy Note  08/30/2021 Name:  DEZHANE STATEN MRN:  [id number] DOB:  09/21/1955  Summary: Patient presents for CCM follow-up.   -Patient with worsening asthma symptoms.   Recommendations/Changes made from today's visit: -After discussion with PCP will plan to: -STOP Incruse -START Symbicort 80-4.5 mcg/act 2 puffs twice daily   Recommend the following changes at PCP follow-up:   -Recommend topiramate 25 mg daily OR propanolol 40 mg daily for migraine prevention.  -Recommend sleep study for evaluation of potential sleep apnea.   Plan: Rescheduled patient PCP follow-up to discuss migraines later this week.  HC Assessment in two weeks to assess Symbicort use.  CPP follow-up one month   Subjective: KELLYANNE ELLWANGER is an 66 y.o. year old female who is a primary patient of [name], [name], [place].  The CCM team was consulted for assistance with disease management and care coordination needs.    Engaged with patient by telephone for follow up visit in response to provider referral for pharmacy case management and/or care coordination services.   Consent to Services:  The patient was given information about Chronic Care Management services, agreed to services, and gave verbal consent prior to initiation of services.  Please see initial visit note for detailed documentation.   Patient Care Team: [name], PA-C as PCP - General (Physician Assistant) Ocie Doyne, [place] (Optometry) Germaine Pomfret, [facility] (Pharmacist)  Recent office visits: 05/12/21: Patient presented to [name], PA-C for follow-up. Aspirin stopped.   05/02/21: Patient presented to [name], PA-C for follow-up. Trulicity 3 mg weekly, losartan 50 mg daiyl started.   Recent consult visits: None noted.  Hospital visits: 3/10-3/13/23: Patient hospitalized for GI bleed.  03/09/21: Patient presented to ED due to abdominal pain.   Objective:  Lab Results  Component Value Date    CREATININE 1.09 (H) 05/02/2021   BUN 24 05/02/2021   GFRNONAA 60 (L) 03/09/2021   GFRAA >60 06/25/2019   NA 138 05/02/2021   K 4.4 05/02/2021   CALCIUM 10.1 05/02/2021   CO2 22 05/02/2021   GLUCOSE 304 (H) 05/02/2021    Lab Results  Component Value Date/Time   HGBA1C 13.8 (A) 01/12/2021 01:33 PM   HGBA1C 10.4 (A) 08/28/2019 06:38 PM   HGBA1C 8.8 (H) 03/12/2018 09:32 AM   HGBA1C 6.4 (H) 12/20/2017 09:24 AM    Last diabetic Eye exam:  Lab Results  Component Value Date/Time   HMDIABEYEEXA No Retinopathy 04/04/2020 12:00 AM    Last diabetic Foot exam: No results found for: "HMDIABFOOTEX"   Lab Results  Component Value Date   CHOL 170 01/26/2021   HDL 44 01/26/2021   LDLCALC 82 01/26/2021   TRIG 267 (H) 01/26/2021   CHOLHDL 3.9 01/26/2021       Latest Ref Rng & Units 03/09/2021    6:49 PM 01/26/2021   10:18 AM 03/12/2018    9:32 AM  Hepatic Function  Total Protein 6.5 - 8.1 g/dL 7.4  6.5  6.5   Albumin 3.5 - 5.0 g/dL 4.5  4.5  4.2   AST 15 - 41 U/L 30  30  34   ALT 0 - 44 U/L 33  38  45   Alk Phosphatase 38 - 126 U/L 90  141  95   Total Bilirubin 0.3 - 1.2 mg/dL 1.2  0.7  0.4     Lab Results  Component Value Date/Time   TSH 2.510 10/14/2015 11:41 AM   TSH 3.030 07/12/2015 07:23 PM  TSH 1.730 09/13/2014 10:26 AM       Latest Ref Rng & Units 05/02/2021    2:33 PM 03/09/2021    6:49 PM 06/25/2019    6:02 PM  CBC  WBC 3.4 - 10.8 x10E3/uL 11.8  11.4  9.5   Hemoglobin 11.1 - 15.9 g/dL 13.8  13.5  14.2   Hematocrit 34.0 - 46.6 % 41.1  39.2  41.6   Platelets 150 - 450 x10E3/uL 216  257  195     Lab Results  Component Value Date/Time   VD25OH 29.0 (L) 10/14/2015 11:41 AM    Clinical ASCVD: No  The 10-year ASCVD risk score (Arnett DK, et al., 2019) is: 11.3%   Values used to calculate the score:     Age: 66 years     Sex: Female     Is Non-Hispanic African American: No     Diabetic: Yes     Tobacco smoker: No     Systolic Blood Pressure: 889 mmHg     Is BP  treated: Yes     HDL Cholesterol: 44 mg/dL     Total Cholesterol: 170 mg/dL       05/02/2021    1:28 PM 03/08/2021    9:05 AM 01/12/2021    1:38 PM  Depression screen PHQ 2/9  Decreased Interest 3 0 2  Down, Depressed, Hopeless 3 0 2  PHQ - 2 Score 6 0 4  Altered sleeping 3 0 3  Tired, decreased energy 3 0 3  Change in appetite 3 0 2  Feeling bad or failure about yourself  3 0 2  Trouble concentrating 3 0 3  Moving slowly or fidgety/restless 3 0 2  Suicidal thoughts 1 0 1  PHQ-9 Score 25 0 20  Difficult doing work/chores Extremely dIfficult Not difficult at all Very difficult    Social History   Tobacco Use  Smoking Status Never  Smokeless Tobacco Never   BP Readings from Last 3 Encounters:  06/14/21 120/70  05/12/21 138/83  05/02/21 (!) 165/93   Pulse Readings from Last 3 Encounters:  06/14/21 (!) 111  05/12/21 80  05/02/21 (!) 113   Wt Readings from Last 3 Encounters:  06/14/21 213 lb 3.2 oz (96.7 kg)  05/12/21 214 lb (97.1 kg)  05/02/21 220 lb 4.8 oz (99.9 kg)   BMI Readings from Last 3 Encounters:  06/14/21 36.60 kg/m  05/12/21 36.73 kg/m  05/02/21 37.81 kg/m    Assessment/Interventions: Review of patient past medical history, allergies, medications, health status, including review of consultants reports, laboratory and other test data, was performed as part of comprehensive evaluation and provision of chronic care management services.   SDOH:  (Social Determinants of Health) assessments and interventions performed: Yes      SDOH Screenings   Alcohol Screen: Low Risk  (03/08/2021)   Alcohol Screen    Last Alcohol Screening Score (AUDIT): 0  Depression (PHQ2-9): Medium Risk (05/02/2021)   Depression (PHQ2-9)    PHQ-2 Score: 25  Financial Resource Strain: Low Risk  (05/15/2021)   Overall Financial Resource Strain (CARDIA)    Difficulty of Paying Living Expenses: Not hard at all  Food Insecurity: No Food Insecurity (03/08/2021)   Hunger Vital Sign     Worried About Running Out of Food in the Last Year: Never true    Ran Out of Food in the Last Year: Never true  Housing: Low Risk  (03/08/2021)   Housing    Last Housing Risk Score: 0  Physical Activity: Insufficiently Active (03/08/2021)   Exercise Vital Sign    Days of Exercise per Week: 4 days    Minutes of Exercise per Session: 10 min  Social Connections: Socially Isolated (03/08/2021)   Social Connection and Isolation Panel [NHANES]    Frequency of Communication with Friends and Family: More than three times a week    Frequency of Social Gatherings with Friends and Family: Twice a week    Attends Religious Services: Never    Marine scientist or Organizations: No    Attends Archivist Meetings: Never    Marital Status: Widowed  Stress: No Stress Concern Present (03/08/2021)   [place]    Feeling of Stress : Not at all  Tobacco Use: Low Risk  (06/14/2021)   Patient History    Smoking Tobacco Use: Never    Smokeless Tobacco Use: Never    Passive Exposure: Not on file  Transportation Needs: No Transportation Needs (03/08/2021)   PRAPARE - Transportation    Lack of Transportation (Medical): No    Lack of Transportation (Non-Medical): No    CCM Care Plan  Allergies  Allergen Reactions   Penicillins Swelling    Medications Reviewed Today     Reviewed by Emelia Loron (Physician Assistant Certified) on 85/63/14 at 1201  Med List Status: <None>   Medication Order Taking? Sig Documenting Provider Last Dose Status Informant  albuterol (VENTOLIN HFA) 108 (90 Base) MCG/ACT inhaler [id number] Yes INHALE 1-2 PUFFS INTO THE LUNGS EVERY 4 TO 6 HOURS AS NEEDED FOR WHEEZING OR SHORTNESS OF BREATH. [name], PA-C Taking Active   baclofen (LIORESAL) 10 MG tablet [id number] Yes Take 1 tablet (10 mg total) by mouth 2 (two) times daily as needed for muscle spasms. Danton Clap, PA-C Taking Active    blood glucose meter kit and supplies [id number] Yes Dispense based on patient and insurance preference. Use daily as directed. 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 Virginia Crews, MD Taking Active   Dulaglutide (TRULICITY) 3 CN/4.7SJ Bonney Aid [id number] Yes Inject 3 mg as directed once a week. [name], PA-C Taking Active   glipiZIDE (GLUCOTROL XL) 10 MG 24 hr tablet [id number] Yes TAKE ONE TABLET BY MOUTH EVERY MORNING with breakfast [name], PA-C Taking Active   glucose blood (ONETOUCH VERIO) test strip [id number] Yes Use to check blood sugars once daily as instructed Bacigalupo, Dionne Bucy, MD Taking Active   losartan (COZAAR) 50 MG tablet [id number] Yes Take 1 tablet (50 mg total) by mouth daily. [name], PA-C Taking Active   montelukast (SINGULAIR) 10 MG tablet [id number] Yes Take 1 tablet (10 mg total) by mouth daily. Virginia Crews, MD Taking Active   Multiple Vitamin (MULTIVITAMIN WITH MINERALS) TABS tablet [id number] Yes Take 1 tablet by mouth daily. Centrum Silver Women's 50+ [provider] Taking Active   NON FORMULARY [id number] Yes Take 1 capsule by mouth in the morning and at bedtime. AZO Bladder Control Supplement [provider] Taking Active   OneTouch Delica Lancets 30S MISC [id number] Yes Use to check blood sugars once daily as directed. Virginia Crews, MD Taking Active   pantoprazole (PROTONIX) 40 MG tablet [id number] Yes Take 40 mg by mouth 2 (two) times daily. [provider] Taking Active   Rimegepant Sulfate (NURTEC) 75 MG TBDP [id number] Yes Take 1 mg by mouth daily as needed. At onset of migraine/aura [name], PA-C  Active   rosuvastatin (CRESTOR) 5 MG tablet  [id number] Yes Take 1 tablet (5 mg total) by mouth daily. Virginia Crews, MD Taking Active   traMADol Veatrice Bourbon) 50 MG tablet [id number] Yes Take 1 tablet (50 mg total) by mouth every 6 (six) hours as needed. [name], PA-C Taking Active   umeclidinium bromide (INCRUSE  ELLIPTA) 62.5 MCG/ACT AEPB [id number] Yes Inhale 1 puff into the lungs daily. Virginia Crews, MD Taking Active     Discontinued 06/25/19 1840             Patient Active Problem List   Diagnosis Date Noted   Migraine with aura and without status migrainosus, not intractable 06/14/2021   Peptic ulcer disease with hemorrhage 05/12/2021   Anemia due to acute blood loss 05/12/2021   Hypertension associated with diabetes ([place]) 05/02/2021   Chronic 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 05/02/2021   OAB (overactive bladder) 01/12/2021   History of total knee arthroplasty 05/09/2020   Morbid obesity ([place]) 08/28/2019   Stiffness of right knee 06/18/2018   Type 2 diabetes mellitus with hyperglycemia, without long-term current use of insulin ([place]) 10/17/2015   Carpal tunnel syndrome of left wrist 10/14/2015   Bipolar depression ([employer]) 07/12/2015   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   Asthmatic bronchitis 08/02/2014   Hyperlipidemia associated with type 2 diabetes mellitus ([place]) 08/02/2014   History of suicidal ideation 08/02/2014   Avitaminosis D 08/02/2014   Generalized hyperhidrosis 08/02/2014   Airway hyperreactivity 08/02/2014   Anxiety disorder 08/02/2014   Insomnia 08/02/2014   Major depressive disorder, single episode 08/02/2014   Shortness of breath 08/05/2009    Immunization History  Administered Date(s) Administered   Influenza,inj,Quad PF,6+ Mos 01/06/2018, 12/12/2018, 05/02/2021   Pneumococcal Polysaccharide-23 07/30/2012   Tdap 07/28/2009    Conditions to be addressed/monitored:  Hyperlipidemia, Diabetes, Asthma, Overactive Bladder, and Bipolar Disorder, Chronic Pain, Chronic Migraines  Care Plan : General Pharmacy (Adult)  Updates made by Germaine Pomfret, RPH since 08/30/2021 12:00 AM     Problem: Hyperlipidemia, Diabetes, Asthma, Overactive Bladder, and Bipolar Disorder, Chronic Pain, Chronic Migraines   Priority: High      Long-Range Goal: Patient-Specific Goal   Start Date: 01/23/2021  Expected End Date: 08/31/2022  This Visit's Progress: On track  Recent Progress: On track  Priority: High  Note:   Current Barriers:  Unable to independently afford treatment regimen Unable to achieve control of diabetes   Pharmacist Clinical Goal(s):  Patient will verbalize ability to afford treatment regimen achieve control of Diabetes as evidenced by A1c less than 8% through collaboration with PharmD and provider.   Interventions: 1:1 collaboration with [name], PA-C regarding development and update of comprehensive plan of care as evidenced by provider attestation and co-signature Inter-disciplinary care team collaboration (see longitudinal plan of care) Comprehensive medication review performed; medication list updated in electronic medical record  Hypertension (BP goal <130/80) -Controlled: Not addressed during this visit. -Current treatment: Losartan 50 mg daily: Appropriate, Query effective  -Medications previously tried: NA  -Current home readings (utilizes wrist monitor):  Did not have readings with her today.  -Denies hypotensive/hypertensive symptoms Continue current medications  Hyperlipidemia: (LDL goal < 70) -Uncontrolled: Not addressed during this visit. -Current treatment: Rosuvastatin 5 mg daily (AM) -Medications previously tried: NA  -Recommended to continue current medication  Diabetes (A1c goal <8%) -Controlled, but in the setting of blood transfusion.  -Current medications: Glipizide XL 10 mg daily: Appropriate, Query effective  Trulicity 3 mg weekly: Appropriate, Query effective -Medications previously tried: Metformin (  sluggish, stomach pain),   -Current home glucose readings fasting glucose: normal range 120s, highest in recall 137 (late night pizza)     -Denies hypoglycemia symptoms:   -Current meal patterns: Appetite significantly reduced. Struggles with sweet tooth.  Endorses indulging in sweets once monthly. Pastries  -Current exercise: Limited by leg pain following knee replacement/femur fracture. Chair exercises 3 times weekly.   Asthma (Goal: control symptoms and prevent exacerbations) -Uncontrolled -Current treatment  Ventolin HFA 1-2 puffs every 4-6 hours as needed Incruse 1 puff daily  Montelukast 10 mg daily  -Medications previously tried: NA  -Exacerbations requiring treatment in last 6 months: None In the past four weeks, has the patient had:  Daytime asthma symptoms more than twice weekly?   Yes; 5-6 times weekly Any instance of night waking due to asthma?   Yes Utilized albuterol reliever for symptoms more than twice weekly? Yes Any activity limitation due to asthma?    Yes -Patient reports consistent use of maintenance inhaler.  -Given worsening asthma symptoms, patient would likely benefit from low-dose ICS + LABA combination.  -STOP Incruse -START Symbicort 80-4.5 mcg/act 2 puffs twice daily   Bipolar Disorder (Goal: Achieve symptom remission) -Not ideally controlled: Not addressed during this visit. -Current treatment: None  -Medications previously tried/failed: Divalproex, Benztropine, Wellbutrin, Citalopram, Sertraline  -Counseled on importance of keeping upcoming psychiatry appointment   Migraines (Goal: Prevent migraines) -Uncontrolled -Current treatment  Rimegepant 75 mg daily as needed: Appropriate, Query effective  -Medications previously tried: Divalproex, Excedrin (GI Bleed) -Rimegepant only effective for mild migraines or if catching early. Patient reports 10 migraines in the past month, 3 were severe.  -Recommend topiramate 25 mg daily OR propanolol 40 mg daily for migraine prevention.   Chronic Pain (Goal: Minimize symptoms) -Controlled: Not addressed during this visit. -Managed by Carlynn Spry, PA-C (Ortho) -Current treatment  Baclofen 10 mg twice daily as needed  Boswellia extract 500 mg twice daily   Tramadol 50 mg q6h PRN  -Medications previously tried: NA -Patient also reports CBD gummy use.  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  -Requires rare use of tramadol   -Recommended to continue current medication  Peptic Ulcer Disease (Goal: Prevent GI bleeds) -Controlled -Current treatment  Pantoprazole 40 mg twice daily  -Medications previously tried: NA  -Recommended to continue current medication  Patient Goals/Self-Care Activities Patient will:  - check glucose daily before breakfast, document, and provide at future appointments  Follow Up Plan: Telephone follow up appointment with care management team member scheduled for:  10/02/21 at 3:00 PM     Medication Assistance: None required.  Patient affirms current coverage meets needs. Patient receives Incruse, Musician through [employer].   Compliance/Adherence/Medication fill history: Care Gaps: Covid Vaccine  HIV Screening  Shingrix  Pneumonia  Foot Exam  Influenza   Star-Rating Drugs: Rosuvastatin 5 mg: LF 08/15/21 for 30-DS from [facility]  Patient's preferred pharmacy is:  [facility] - [place], [place] - [address] [place] [address] Phone: [phone] Fax: [phone]  [facility] [place], [place] [address] [facility] [address] [place] [address] Phone: [phone] Fax: [phone]  [id number] [place] [address] [place]: [phone]: [phone] Uses pill box? Yes Pt endorses 100% compliance  Patient decided to: Utilize [facility] for medication synchronization, packaging and delivery  Care  Plan and Follow Up Patient Decision:  Patient agrees to Care Plan and Follow-up.  Plan: Telephone follow up appointment with care management team member scheduled for:  10/02/21 at 3:00  PM  Junius Argyle, PharmD, [place], [place] [phone]

## 2021-08-30 NOTE — Patient Instructions (Signed)
Visit Information It was great speaking with you today!  Please let me know if you have any questions about our visit.   Goals Addressed             This Visit's Progress    Monitor and Manage My Blood Sugar-Diabetes Type 2       Timeframe:  Long-Range Goal Priority:  High Start Date: 01/23/2021                            Expected End Date: 01/24/2023                      Follow Up within 30 days   - check blood sugar before breakfast - check blood sugar if I feel it is too high or too low - enter blood sugar readings and medication or insulin into daily log - take the blood sugar log to all doctor visits    Why is this important?   Checking your blood sugar at home helps to keep it from getting very high or very low.  Writing the results in a diary or log helps the doctor know how to care for you.  Your blood sugar log should have the time, date and the results.  Also, write down the amount of insulin or other medicine that you take.  Other information, like what you ate, exercise done and how you were feeling, will also be helpful.     Notes:         Patient Care Plan: General Pharmacy (Adult)     Problem Identified: Hyperlipidemia, Diabetes, Asthma, Overactive Bladder, and Bipolar Disorder, Chronic Pain, Chronic Migraines   Priority: High     Long-Range Goal: Patient-Specific Goal   Start Date: 01/23/2021  Expected End Date: 08/31/2022  This Visit's Progress: On track  Recent Progress: On track  Priority: High  Note:   Current Barriers:  Unable to independently afford treatment regimen Unable to achieve control of diabetes   Pharmacist Clinical Goal(s):  Patient will verbalize ability to afford treatment regimen achieve control of Diabetes as evidenced by A1c less than 8% through collaboration with PharmD and provider.   Interventions: 1:1 collaboration with Alfredia Ferguson, PA-C regarding development and update of comprehensive plan of care as evidenced by  provider attestation and co-signature Inter-disciplinary care team collaboration (see longitudinal plan of care) Comprehensive medication review performed; medication list updated in electronic medical record  Hypertension (BP goal <130/80) -Controlled: Not addressed during this visit. -Current treatment: Losartan 50 mg daily: Appropriate, Query effective  -Medications previously tried: NA  -Current home readings (utilizes wrist monitor):  Did not have readings with her today.  -Denies hypotensive/hypertensive symptoms Continue current medications  Hyperlipidemia: (LDL goal < 70) -Uncontrolled: Not addressed during this visit. -Current treatment: Rosuvastatin 5 mg daily (AM) -Medications previously tried: NA  -Recommended to continue current medication  Diabetes (A1c goal <8%) -Controlled, but in the setting of blood transfusion.  -Current medications: Glipizide XL 10 mg daily: Appropriate, Query effective  Trulicity 3 mg weekly: Appropriate, Query effective -Medications previously tried: Metformin (sluggish, stomach pain),   -Current home glucose readings fasting glucose: normal range 120s, highest in recall 137 (late night pizza)     -Denies hypoglycemia symptoms:   -Current meal patterns: Appetite significantly reduced. Struggles with sweet tooth. Endorses indulging in sweets once monthly. Pastries  -Current exercise: Limited by leg pain following knee replacement/femur fracture. Chair exercises 3  times weekly.   Asthma (Goal: control symptoms and prevent exacerbations) -Uncontrolled -Current treatment  Ventolin HFA 1-2 puffs every 4-6 hours as needed Incruse 1 puff daily  Montelukast 10 mg daily  -Medications previously tried: NA  -Exacerbations requiring treatment in last 6 months: None In the past four weeks, has the patient had:  Daytime asthma symptoms more than twice weekly?   Yes; 5-6 times weekly Any instance of night waking due to asthma?   Yes Utilized  albuterol reliever for symptoms more than twice weekly? Yes Any activity limitation due to asthma?    Yes -Patient reports consistent use of maintenance inhaler.  -Given worsening asthma symptoms, patient would likely benefit from low-dose ICS + LABA combination.  -STOP Incruse -START Symbicort 80-4.5 mcg/act 2 puffs twice daily   Bipolar Disorder (Goal: Achieve symptom remission) -Not ideally controlled: Not addressed during this visit. -Current treatment: None  -Medications previously tried/failed: Divalproex, Benztropine, Wellbutrin, Citalopram, Sertraline  -Counseled on importance of keeping upcoming psychiatry appointment   Migraines (Goal: Prevent migraines) -Uncontrolled -Current treatment  Rimegepant 75 mg daily as needed: Appropriate, Query effective  -Medications previously tried: Divalproex, Excedrin (GI Bleed) -Rimegepant only effective for mild migraines or if catching early. Patient reports 10 migraines in the past month, 3 were severe.  -Recommend topiramate 25 mg daily OR propanolol 40 mg daily for migraine prevention.   Chronic Pain (Goal: Minimize symptoms) -Controlled: Not addressed during this visit. -Managed by Altamese Cabal, PA-C (Ortho) -Current treatment  Baclofen 10 mg twice daily as needed  Boswellia extract 500 mg twice daily  Tramadol 50 mg q6h PRN  -Medications previously tried: NA -Patient also reports CBD gummy use.  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  -Requires rare use of tramadol   -Recommended to continue current medication  Peptic Ulcer Disease (Goal: Prevent GI bleeds) -Controlled -Current treatment  Pantoprazole 40 mg twice daily  -Medications previously tried: NA  -Recommended to continue current medication  Patient Goals/Self-Care Activities Patient will:  - check glucose daily before breakfast, document, and provide at future appointments  Follow Up Plan: Telephone follow up  appointment with care management team member scheduled for:  10/02/21 at 3:00 PM      Patient agreed to services and verbal consent obtained.   Patient verbalizes understanding of instructions and care plan provided today and agrees to view in MyChart. Active MyChart status and patient understanding of how to access instructions and care plan via MyChart confirmed with patient.     Cheyenne Adas, CPP Clinical Pharmacist Practitioner  [facility] [phone]

## 2021-08-31 ENCOUNTER — Ambulatory Visit: Payer: Medicare HMO | Admitting: Physician Assistant

## 2021-08-31 NOTE — Progress Notes (Deleted)
I,Sha'taria Nikolaus Pienta,acting as a Education administrator for [employer], PA-C.,have documented all relevant documentation on the behalf of [name], PA-C,as directed by  [name], PA-C while in the presence of [name], PA-C.  Established patient visit   Patient: Joanna Bishop   DOB: 1955-04-22   66 y.o. Female  MRN: [id number] Visit Date: 08/31/2021  Today's healthcare provider: [name], PA-C   No chief complaint on file.  Subjective    HPI  Diabetes Mellitus Type II, Follow-up  Lab Results  Component Value Date   HGBA1C 13.8 (A) 01/12/2021   HGBA1C 10.4 (A) 08/28/2019   HGBA1C 7.9 (A) 12/12/2018   Wt Readings from Last 3 Encounters:  06/14/21 213 lb 3.2 oz (96.7 kg)  05/12/21 214 lb (97.1 kg)  05/02/21 220 lb 4.8 oz (99.9 kg)   Last seen for diabetes 4 months ago.  Management since then includes increase trulicity to 3 mg and continue glipizide 10 mg. She reports {excellent/good/fair/poor:19665} compliance with treatment. She {is/is not:21021397} having side effects. {document side effects if present:1} Symptoms: {Yes/No:20286} fatigue {Yes/No:20286} foot ulcerations  {Yes/No:20286} appetite changes {Yes/No:20286} nausea  {Yes/No:20286} paresthesia of the feet  {Yes/No:20286} polydipsia  {Yes/No:20286} polyuria {Yes/No:20286} visual disturbances   {Yes/No:20286} vomiting     Home blood sugar records: {diabetes glucometry results:16657}  Episodes of hypoglycemia? {Yes/No:20286} {enter symptoms and frequency of symptoms if yes:1}   Current insulin regiment: {enter 'none' or type of insulin and number of units taken with each dose of each insulin formulation that the patient is taking:1} Most Recent Eye Exam: *** {Current exercise:16438:::1} {Current diet habits:16563:::1}  Pertinent Labs: Lab Results  Component Value Date   CHOL 170 01/26/2021   HDL 44 01/26/2021   LDLCALC 82 01/26/2021   TRIG 267 (H) 01/26/2021   CHOLHDL 3.9 01/26/2021   Lab  Results  Component Value Date   NA 138 05/02/2021   K 4.4 05/02/2021   CREATININE 1.09 (H) 05/02/2021   EGFR 57 (L) 05/02/2021   LABMICR 146.2 12/20/2017     --------------------------------------------------------------------------------------------------- Follow up for migranes  The patient was last seen for this 11 weeks ago. Changes made at last visit include  a trial of nurtec.  She reports {excellent/good/fair/poor:19665} compliance with treatment. She feels that condition is {improved/worse/unchanged:3041574}. She {is/is not:21021397} having side effects. ***  ----------------------------------------------------------------------------------------- Asthma: Patient presents for asthma follow-up. She is not currently in exacerbation. Symptoms currently include {asthma symptoms:406::"dyspnea","non-productive cough","wheezing"} and occur {freq:16479}.  Observed precipitants include {asthma precips:408}.  Current limitations in activity from asthma: {none:33079}.  Number of days of school or work missed in the last month: {numbers 0-31:32273}. Frequency of use of quick-relief meds: ***. {adher:16553::"The patient reports adherence to this regimen"}   Medications: Outpatient Medications Prior to Visit  Medication Sig   albuterol (VENTOLIN HFA) 108 (90 Base) MCG/ACT inhaler INHALE 1-2 PUFFS INTO THE LUNGS EVERY 4 TO 6 HOURS AS NEEDED FOR WHEEZING OR SHORTNESS OF BREATH.   baclofen (LIORESAL) 10 MG tablet Take 1 tablet (10 mg total) by mouth 2 (two) times daily as needed for muscle spasms.   blood glucose meter kit and supplies Dispense based on patient and insurance preference. Use daily as directed. 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   budesonide-formoterol (SYMBICORT) 80-4.5 MCG/ACT inhaler Inhale 2 puffs into the lungs 2 (two) times daily.   Dulaglutide (TRULICITY) 3 HU/8.3FG SOPN Inject 3 mg as directed once a week.   glipiZIDE (GLUCOTROL XL) 10 MG 24 hr tablet TAKE ONE TABLET BY MOUTH EVERY  MORNING  with breakfast   glucose blood (ONETOUCH VERIO) test strip Use to check blood sugars once daily as instructed   losartan (COZAAR) 50 MG tablet Take 1 tablet (50 mg total) by mouth daily.   montelukast (SINGULAIR) 10 MG tablet Take 1 tablet (10 mg total) by mouth daily.   Multiple Vitamin (MULTIVITAMIN WITH MINERALS) TABS tablet Take 1 tablet by mouth daily. Centrum Silver Women's 50+   NON FORMULARY Take 1 capsule by mouth in the morning and at bedtime. AZO Bladder Control Supplement   OneTouch Delica Lancets 43B MISC Use to check blood sugars once daily as directed.   oxybutynin (DITROPAN XL) 10 MG 24 hr tablet Take 1 tablet (10 mg total) by mouth at bedtime.   pantoprazole (PROTONIX) 40 MG tablet Take 40 mg by mouth 2 (two) times daily.   Rimegepant Sulfate (NURTEC) 75 MG TBDP Take 1 mg by mouth daily as needed. At onset of migraine/aura   rosuvastatin (CRESTOR) 5 MG tablet Take 1 tablet (5 mg total) by mouth daily.   traMADol (ULTRAM) 50 MG tablet Take 1 tablet (50 mg total) by mouth every 6 (six) hours as needed.   No facility-administered medications prior to visit.    Review of Systems  {Labs  Heme  Chem  Endocrine  Serology  Results Review (optional):23779}   Objective    There were no vitals taken for this visit. {Show previous vital signs (optional):23777}  Physical Exam  ***  No results found for any visits on 08/31/21.  Assessment & Plan     ***  No follow-ups on file.      {provider attestation***:1}   [name], PA-C  [facility] [phone] (phone) [phone] (fax)  [place]

## 2021-09-06 ENCOUNTER — Ambulatory Visit (INDEPENDENT_AMBULATORY_CARE_PROVIDER_SITE_OTHER): Payer: Medicare HMO | Admitting: Physician Assistant

## 2021-09-06 ENCOUNTER — Encounter: Payer: Self-pay | Admitting: Physician Assistant

## 2021-09-06 VITALS — BP 132/86 | HR 108 | Wt 217.2 lb

## 2021-09-06 DIAGNOSIS — E1122 Type 2 diabetes mellitus with diabetic chronic kidney disease: Secondary | ICD-10-CM | POA: Diagnosis not present

## 2021-09-06 DIAGNOSIS — J4540 Moderate persistent asthma, uncomplicated: Secondary | ICD-10-CM

## 2021-09-06 DIAGNOSIS — E1159 Type 2 diabetes mellitus with other circulatory complications: Secondary | ICD-10-CM | POA: Diagnosis not present

## 2021-09-06 DIAGNOSIS — E785 Hyperlipidemia, unspecified: Secondary | ICD-10-CM | POA: Diagnosis not present

## 2021-09-06 DIAGNOSIS — I152 Hypertension secondary to endocrine disorders: Secondary | ICD-10-CM

## 2021-09-06 DIAGNOSIS — D72829 Elevated white blood cell count, unspecified: Secondary | ICD-10-CM

## 2021-09-06 DIAGNOSIS — N1831 Chronic kidney disease, stage 3a: Secondary | ICD-10-CM | POA: Diagnosis not present

## 2021-09-06 DIAGNOSIS — G43109 Migraine with aura, not intractable, without status migrainosus: Secondary | ICD-10-CM

## 2021-09-06 DIAGNOSIS — E1169 Type 2 diabetes mellitus with other specified complication: Secondary | ICD-10-CM | POA: Diagnosis not present

## 2021-09-06 DIAGNOSIS — Z1211 Encounter for screening for malignant neoplasm of colon: Secondary | ICD-10-CM | POA: Diagnosis not present

## 2021-09-06 LAB — POCT GLYCOSYLATED HEMOGLOBIN (HGB A1C): Hemoglobin A1C: 6 % — AB (ref 4.0–5.6)

## 2021-09-06 MED ORDER — PROPRANOLOL HCL ER 60 MG PO CP24: 60.0000 mg | ORAL_CAPSULE | Freq: Every day | ORAL | 1 refills | Status: DC

## 2021-09-06 NOTE — Assessment & Plan Note (Signed)
A1c improved to 6.0% continue with trulicity 3 mg weekly, glipizide 10 mg. Still advised pt to check fasting sugars. On statin uacr today  Will do foot exam next visit F/u 3 mo -- but seeing her in 8 weeks for migraines, will do foot exam then

## 2021-09-06 NOTE — Assessment & Plan Note (Signed)
Will check fasting lipid panel Manages with rosuvastatin 5 mg The 10-year ASCVD risk score (Arnett DK, et al., 2019) is: 10.2%

## 2021-09-06 NOTE — Assessment & Plan Note (Signed)
No improvement with nurtec. Ref to neurology d/t severity over time.  For now, can try propranolol 60 mg daily for prevention. HR in offce 97-108.  Pt has apple watch and will monitor HR at home.  F/u 8 weeks and with neuro

## 2021-09-06 NOTE — Assessment & Plan Note (Signed)
Advised pt if she does not get symbicort mailed to her this week to call office

## 2021-09-06 NOTE — Progress Notes (Signed)
I,[name],acting as a Education administrator for [employer], PA-C.,have documented all relevant documentation on the behalf of Joanna Kirschner, PA-C,as directed by  Joanna Kirschner, PA-C while in the presence of Joanna Kirschner, PA-C.   Established patient visit   Patient: Joanna Bishop   DOB: Mar 15, 1955   66 y.o. Female  MRN: [id number] Visit Date: 09/06/2021  Today's healthcare provider: [name], PA-C   No chief complaint on file.  Subjective    HPI  Headaches -Used to manage with alleve/Excedrin migraine, but has d/c since GI bleed. Reports nurtec not helpful. Headaches 3-5 times a week, usually L temple, throbbing. Sometimes radiates to back of left head. Denies ever seeing neurology. Usually just lies in a dark room until the headache improves.  Most days will wake up with a low-level headache.  Admits to light sensitivity during episode. She will sometimes see an aura -- dark spots. Associated nausea.  Asthma -Did not get Symbicort from mail pharmacy yet.   Diabetes Mellitus Type II, Follow-up  Lab Results  Component Value Date   HGBA1C 6.0 (A) 09/06/2021   HGBA1C 13.8 (A) 01/12/2021   HGBA1C 10.4 (A) 08/28/2019   Wt Readings from Last 3 Encounters:  09/06/21 217 lb 3.2 oz (98.5 kg)  06/14/21 213 lb 3.2 oz (96.7 kg)  05/12/21 214 lb (97.1 kg)   Management since then includes trulicity 3 mg weekly, glipizide 10 mg. She reports excellent compliance with treatment. She is not having side effects.  Symptoms: No fatigue No foot ulcerations  No appetite changes No nausea  No paresthesia of the feet  No polydipsia  No polyuria Yes visual disturbances   No vomiting     Home blood sugar records: fasting range: 127-134  Episodes of hypoglycemia? No   Current insulin regiment:none Most Recent Eye Exam: appointment next month Current exercise: none Current diet habits: well balanced  Pertinent Labs: Lab Results  Component Value Date   CHOL 170 01/26/2021    HDL 44 01/26/2021   LDLCALC 82 01/26/2021   TRIG 267 (H) 01/26/2021   CHOLHDL 3.9 01/26/2021   Lab Results  Component Value Date   NA 138 05/02/2021   K 4.4 05/02/2021   CREATININE 1.09 (H) 05/02/2021   EGFR 57 (L) 05/02/2021   LABMICR 146.2 12/20/2017     --------------------------------------------------------------------------------------------------- Hypertension, follow-up  BP Readings from Last 3 Encounters:  09/06/21 132/86  06/14/21 120/70  05/12/21 138/83   Wt Readings from Last 3 Encounters:  09/06/21 217 lb 3.2 oz (98.5 kg)  06/14/21 213 lb 3.2 oz (96.7 kg)  05/12/21 214 lb (97.1 kg)     Manages with losartan 50 my daily.   She reports excellent compliance with treatment. She is not having side effects.She is following a Regular diet. She is not exercising. She does not smoke.  Use of agents associated with hypertension: none.   Outside blood pressures are not checked consistently.  Symptoms: No chest pain No chest pressure  No palpitations No syncope  No dyspnea No orthopnea  No paroxysmal nocturnal dyspnea No lower extremity edema   Pertinent labs Lab Results  Component Value Date   CHOL 170 01/26/2021   HDL 44 01/26/2021   LDLCALC 82 01/26/2021   TRIG 267 (H) 01/26/2021   CHOLHDL 3.9 01/26/2021   Lab Results  Component Value Date   NA 138 05/02/2021   K 4.4 05/02/2021   CREATININE 1.09 (H) 05/02/2021   EGFR 57 (L) 05/02/2021   GLUCOSE 304 (  H) 05/02/2021   TSH 2.510 10/14/2015     The 10-year ASCVD risk score (Arnett DK, et al., 2019) is: 13.5%  ---------------------------------------------------------------------------------------------------   Medications: Outpatient Medications Prior to Visit  Medication Sig   albuterol (VENTOLIN HFA) 108 (90 Base) MCG/ACT inhaler INHALE 1-2 PUFFS INTO THE LUNGS EVERY 4 TO 6 HOURS AS NEEDED FOR WHEEZING OR SHORTNESS OF BREATH.   baclofen (LIORESAL) 10 MG tablet Take 1 tablet (10 mg total) by mouth 2  (two) times daily as needed for muscle spasms.   blood glucose meter kit and supplies Dispense based on patient and insurance preference. Use daily as directed. 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   budesonide-formoterol (SYMBICORT) 80-4.5 MCG/ACT inhaler Inhale 2 puffs into the lungs 2 (two) times daily.   Dulaglutide (TRULICITY) 3 QJ/1.9ER SOPN Inject 3 mg as directed once a week.   glipiZIDE (GLUCOTROL XL) 10 MG 24 hr tablet TAKE ONE TABLET BY MOUTH EVERY MORNING with breakfast   glucose blood (ONETOUCH VERIO) test strip Use to check blood sugars once daily as instructed   losartan (COZAAR) 50 MG tablet Take 1 tablet (50 mg total) by mouth daily.   montelukast (SINGULAIR) 10 MG tablet Take 1 tablet (10 mg total) by mouth daily.   Multiple Vitamin (MULTIVITAMIN WITH MINERALS) TABS tablet Take 1 tablet by mouth daily. Centrum Silver Women's 50+   NON FORMULARY Take 1 capsule by mouth in the morning and at bedtime. AZO Bladder Control Supplement   OneTouch Delica Lancets 74Y MISC Use to check blood sugars once daily as directed.   oxybutynin (DITROPAN XL) 10 MG 24 hr tablet Take 1 tablet (10 mg total) by mouth at bedtime.   Rimegepant Sulfate (NURTEC) 75 MG TBDP Take 1 mg by mouth daily as needed. At onset of migraine/aura   rosuvastatin (CRESTOR) 5 MG tablet Take 1 tablet (5 mg total) by mouth daily.   traMADol (ULTRAM) 50 MG tablet Take 1 tablet (50 mg total) by mouth every 6 (six) hours as needed.   [DISCONTINUED] pantoprazole (PROTONIX) 40 MG tablet Take 40 mg by mouth 2 (two) times daily. (Patient not taking: Reported on 09/06/2021)   No facility-administered medications prior to visit.    Review of Systems  Constitutional:  Negative for fatigue and fever.  Respiratory:  Negative for cough and shortness of breath.   Cardiovascular:  Negative for chest pain and leg swelling.  Gastrointestinal:  Negative for abdominal pain.  Neurological:  Positive for headaches. Negative for dizziness.        Objective    Blood pressure 132/86, pulse (!) 108, weight 217 lb 3.2 oz (98.5 kg), SpO2 97 %.   Physical Exam Constitutional:      General: She is awake.     Appearance: She is well-developed.  HENT:     Head: Normocephalic.  Eyes:     Conjunctiva/sclera: Conjunctivae normal.  Cardiovascular:     Rate and Rhythm: Normal rate and regular rhythm.     Heart sounds: Normal heart sounds.  Pulmonary:     Effort: Pulmonary effort is normal.     Breath sounds: Normal breath sounds.  Skin:    General: Skin is warm.  Neurological:     Mental Status: She is alert and oriented to person, place, and time.  Psychiatric:        Attention and Perception: Attention normal.        Mood and Affect: Mood normal.        Speech: Speech normal.  Behavior: Behavior is cooperative.      Results for orders placed or performed in visit on 09/06/21  POCT glycosylated hemoglobin (Hb A1C)  Result Value Ref Range   Hemoglobin A1C 6.0 (A) 4.0 - 5.6 %   HbA1c POC (<> result, manual entry)     HbA1c, POC (prediabetic range)     HbA1c, POC (controlled diabetic range)      Assessment & Plan     Problem List Items Addressed This Visit       Cardiovascular and Mediastinum   Hypertension associated with diabetes ([place]) - Primary    Slightly elevated with losartan 50 mg daily. Will continue to monitor.        Relevant Medications   propranolol ER (INDERAL LA) 60 MG 24 hr capsule   Other Relevant Orders   Comprehensive Metabolic Panel (CMET)   Migraine with aura and without status migrainosus, not intractable    No improvement with nurtec. Ref to neurology d/t severity over time.  For now, can try propranolol 60 mg daily for prevention. HR in offce 97-108.  Pt has apple watch and will monitor HR at home.  F/u 8 weeks and with neuro      Relevant Medications   propranolol ER (INDERAL LA) 60 MG 24 hr capsule   Other Relevant Orders   Ambulatory referral to Neurology     Respiratory    Asthmatic bronchitis    Advised pt if she does not get symbicort mailed to her this week to call office        Endocrine   Hyperlipidemia associated with type 2 diabetes mellitus ([place])    Will check fasting lipid panel Manages with rosuvastatin 5 mg The 10-year ASCVD risk score (Arnett DK, et al., 2019) is: 10.2%       Relevant Medications   propranolol ER (INDERAL LA) 60 MG 24 hr capsule   Other Relevant Orders   Comprehensive Metabolic Panel (CMET)   Lipid Profile   Type 2 diabetes mellitus with stage 3a chronic kidney disease, without long-term current use of insulin ([facility])    A1c improved to 6.0% continue with trulicity 3 mg weekly, glipizide 10 mg. Still advised pt to check fasting sugars. On statin uacr today  Will do foot exam next visit F/u 3 mo -- but seeing her in 8 weeks for migraines, will do foot exam then      Relevant Orders   Urine Microalbumin w/creat. ratio   POCT glycosylated hemoglobin (Hb A1C) (Completed)   Other Visit Diagnoses     Leukocytosis, unspecified type       Relevant Orders   CBC w/Diff/Platelet   Colon cancer screening       Relevant Orders   Cologuard        Return in about 8 weeks (around 11/01/2021) for migraines.      I, Joanna Kirschner, PA-C have reviewed all documentation for this visit. The documentation on  09/06/2021  for the exam, diagnosis, procedures, and orders are all accurate and complete.  Joanna Kirschner, PA-C [facility] [address] [place], [place], [address] Office: [phone] Fax: [place]

## 2021-09-06 NOTE — Assessment & Plan Note (Signed)
Slightly elevated with losartan 50 mg daily. Will continue to monitor.

## 2021-09-07 ENCOUNTER — Other Ambulatory Visit: Payer: Self-pay | Admitting: Physician Assistant

## 2021-09-07 ENCOUNTER — Telehealth: Payer: Self-pay

## 2021-09-07 ENCOUNTER — Other Ambulatory Visit: Payer: Self-pay | Admitting: Family Medicine

## 2021-09-07 DIAGNOSIS — R801 Persistent proteinuria, unspecified: Secondary | ICD-10-CM

## 2021-09-07 DIAGNOSIS — E0822 Diabetes mellitus due to underlying condition with diabetic chronic kidney disease: Secondary | ICD-10-CM

## 2021-09-07 DIAGNOSIS — N1831 Chronic kidney disease, stage 3a: Secondary | ICD-10-CM

## 2021-09-07 DIAGNOSIS — E1159 Type 2 diabetes mellitus with other circulatory complications: Secondary | ICD-10-CM

## 2021-09-07 LAB — COMPREHENSIVE METABOLIC PANEL
ALT: 25 IU/L (ref 0–32)
AST: 21 IU/L (ref 0–40)
Albumin/Globulin Ratio: 1.8 (ref 1.2–2.2)
Albumin: 4.3 g/dL (ref 3.9–4.9)
Alkaline Phosphatase: 84 IU/L (ref 44–121)
BUN/Creatinine Ratio: 20 (ref 12–28)
BUN: 23 mg/dL (ref 8–27)
Bilirubin Total: 0.7 mg/dL (ref 0.0–1.2)
CO2: 19 mmol/L — ABNORMAL LOW (ref 20–29)
Calcium: 9.5 mg/dL (ref 8.7–10.3)
Chloride: 103 mmol/L (ref 96–106)
Creatinine, Ser: 1.17 mg/dL — ABNORMAL HIGH (ref 0.57–1.00)
Globulin, Total: 2.4 g/dL (ref 1.5–4.5)
Glucose: 159 mg/dL — ABNORMAL HIGH (ref 70–99)
Potassium: 4.5 mmol/L (ref 3.5–5.2)
Sodium: 137 mmol/L (ref 134–144)
Total Protein: 6.7 g/dL (ref 6.0–8.5)
eGFR: 52 mL/min/{1.73_m2} — ABNORMAL LOW (ref >59)

## 2021-09-07 LAB — CBC WITH DIFFERENTIAL/PLATELET
Basophils Absolute: 0.1 10*3/uL (ref 0.0–0.2)
Basos: 1 %
EOS (ABSOLUTE): 0.5 10*3/uL — ABNORMAL HIGH (ref 0.0–0.4)
Eos: 4 %
Hematocrit: 39.6 % (ref 34.0–46.6)
Hemoglobin: 13.1 g/dL (ref 11.1–15.9)
Immature Grans (Abs): 0 10*3/uL (ref 0.0–0.1)
Immature Granulocytes: 0 %
Lymphocytes Absolute: 3.1 10*3/uL (ref 0.7–3.1)
Lymphs: 28 %
MCH: 28.8 pg (ref 26.6–33.0)
MCHC: 33.1 g/dL (ref 31.5–35.7)
MCV: 87 fL (ref 79–97)
Monocytes Absolute: 0.8 10*3/uL (ref 0.1–0.9)
Monocytes: 8 %
Neutrophils Absolute: 6.4 10*3/uL (ref 1.4–7.0)
Neutrophils: 59 %
Platelets: 243 10*3/uL (ref 150–450)
RBC: 4.55 x10E6/uL (ref 3.77–5.28)
RDW: 15.5 % — ABNORMAL HIGH (ref 11.7–15.4)
WBC: 11 10*3/uL — ABNORMAL HIGH (ref 3.4–10.8)

## 2021-09-07 LAB — LIPID PANEL
Chol/HDL Ratio: 3.6 ratio (ref 0.0–4.4)
Cholesterol, Total: 129 mg/dL (ref 100–199)
HDL: 36 mg/dL — ABNORMAL LOW (ref >39)
LDL Chol Calc (NIH): 49 mg/dL (ref 0–99)
Triglycerides: 288 mg/dL — ABNORMAL HIGH (ref 0–149)
VLDL Cholesterol Cal: 44 mg/dL — ABNORMAL HIGH (ref 5–40)

## 2021-09-07 LAB — MICROALBUMIN / CREATININE URINE RATIO
Creatinine, Urine: 95.2 mg/dL
Microalb/Creat Ratio: 249 mg/g creat — ABNORMAL HIGH (ref 0–29)
Microalbumin, Urine: 236.9 ug/mL

## 2021-09-07 MED ORDER — KERENDIA 10 MG PO TABS: 10.0000 mg | ORAL_TABLET | Freq: Every day | ORAL | 11 refills | Status: DC

## 2021-09-07 NOTE — Progress Notes (Signed)
Chronic Care Management Pharmacy Assistant   Name: Joanna Bishop  MRN: [id number] DOB: 1955/08/01  Reason for Encounter: Medication Review/Medication Coordination Call for [facility]   Recent office visits:  09/06/2021 Joanna Kirschner, PA-C (PCP Office Visit) Stopped: Pantoprazole Sodium 40 mg due to patient not taking, Started: Propranolol HCl 60 mg daily, Lab orders placed, Referral to Neurology placed, Patient to follow-up in 8 weeks  Recent consult visits:  None ID  Hospital visits:  None in previous 6 months  Medications: Outpatient Encounter Medications as of 09/07/2021  Medication Sig   albuterol (VENTOLIN HFA) 108 (90 Base) MCG/ACT inhaler INHALE 1-2 PUFFS INTO THE LUNGS EVERY 4 TO 6 HOURS AS NEEDED FOR WHEEZING OR SHORTNESS OF BREATH.   baclofen (LIORESAL) 10 MG tablet Take 1 tablet (10 mg total) by mouth 2 (two) times daily as needed for muscle spasms.   blood glucose meter kit and supplies Dispense based on patient and insurance preference. Use daily as directed. 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   budesonide-formoterol (SYMBICORT) 80-4.5 MCG/ACT inhaler Inhale 2 puffs into the lungs 2 (two) times daily.   Dulaglutide (TRULICITY) 3 VQ/0.0QQ SOPN Inject 3 mg as directed once a week.   glipiZIDE (GLUCOTROL XL) 10 MG 24 hr tablet TAKE ONE TABLET BY MOUTH EVERY MORNING with breakfast   glucose blood (ONETOUCH VERIO) test strip Use to check blood sugars once daily as instructed   losartan (COZAAR) 50 MG tablet Take 1 tablet (50 mg total) by mouth daily.   montelukast (SINGULAIR) 10 MG tablet Take 1 tablet (10 mg total) by mouth daily.   Multiple Vitamin (MULTIVITAMIN WITH MINERALS) TABS tablet Take 1 tablet by mouth daily. Centrum Silver Women's 50+   NON FORMULARY Take 1 capsule by mouth in the morning and at bedtime. AZO Bladder Control Supplement   OneTouch Delica Lancets 76P MISC Use to check blood sugars once daily as directed.   oxybutynin (DITROPAN XL) 10 MG 24 hr tablet Take  1 tablet (10 mg total) by mouth at bedtime.   propranolol ER (INDERAL LA) 60 MG 24 hr capsule Take 1 capsule (60 mg total) by mouth daily.   Rimegepant Sulfate (NURTEC) 75 MG TBDP Take 1 mg by mouth daily as needed. At onset of migraine/aura   rosuvastatin (CRESTOR) 5 MG tablet Take 1 tablet (5 mg total) by mouth daily.   traMADol (ULTRAM) 50 MG tablet Take 1 tablet (50 mg total) by mouth every 6 (six) hours as needed.   [DISCONTINUED] XARELTO 10 MG TABS tablet Take 10 mg by mouth every morning.   No facility-administered encounter medications on file as of 09/07/2021.   Care Gaps: COVID-19 Vaccine HIV Screening Fecal DNA (Cologuard) Diabetic Foot Exam PAP Smear Mammogram  Star Rating Drugs: Trulicity 3 mg patient receives this medication through the [place] Glipizide 10 mg last filled on 08/15/2021 for a 30-Day supply with [facility] Losartan 50 mg last filled on 08/15/2021 for a 30-Day supply with [facility] Rosuvastatin 5 mg last filled on 08/15/2021 for a 30-Day supply with [facility]  BP Readings from Last 3 Encounters:  09/06/21 132/86  06/14/21 120/70  05/12/21 138/83    Lab Results  Component Value Date   HGBA1C 6.0 (A) 09/06/2021    Patient obtains medications through [facility]  30 Days   Last adherence delivery included:  Losartan 50 mg 1 tablet daily (Breakfast) Albuterol 108 Inhaler inhale 1-2 puffs every every 4-6 hours prn for wheezing or shortness of breath Glipizide 10 mg  1 tablet daily (Breakfast) Montelukast 10 mg 1 tablet daily (Bedtime) Rosuvastatin 5 mg 1 tablet daily (Breakfast) Onetouch Verio Test Strips Onetouch Lancets Oxybutynin 10 mg 1 tablet daily (Bedtime)  Patient declined medications for the month of June: Trulicity Inject 1.5 mg into the skin once a week- Patient is getting this medication through [facility] Increase Ellipta 62.5 mcg Inhale 1 puff into lungs daily-Patient is getting this medication through [facility] Nurtec 75 mg 1  tablet as needed at onset of migraine-Patient receives this medication through [facility]  Patient is due for next adherence delivery on: 09/18/2021 (Monday) 2nd Route.  Called patient and reviewed medications and coordinated delivery.  This delivery to include: Losartan 50 mg 1 tablet daily (Breakfast) Albuterol 108 Inhaler inhale 1-2 puffs every every 4-6 hours prn for wheezing or shortness of breath Glipizide 10 mg 1 tablet daily (Breakfast) Montelukast 10 mg 1 tablet daily (Bedtime) Rosuvastatin 5 mg 1 tablet daily (Breakfast) Oxybutynin 10 mg 1 tablet daily (Bedtime) Propanolol ER 60 mg daily   Patient declined the following medications for July: Nurtec 75 mg 1 tablet as needed at onset of migraine-Patient receives this medication through [employer] [facility] Trulicity Inject 1.5 mg into the skin once a week- Patient is getting this medication through [facility] Symbicort 80-4.5 mcg Inhaler Inhale 2 puffs twice daily-Patient will be getting this medication from [facility]  Patient needs refills for  No refills needed this delivery   Confirmed delivery date of 09/18/2021 2nd Route, advised patient that pharmacy will contact them the morning of delivery.  Patient has a follow-up telephone appointment with Junius Argyle, CPP on 10/02/2021 @ 1500.  Lynann Bologna, CPA/CMA Clinical Pharmacist Assistant Phone: [phone]

## 2021-09-07 NOTE — Progress Notes (Signed)
Urine micro albumin is increased from result 3 years ago. Given BP is close to goal; we need to evaluate hydration and kidney function.  I would recommend use of a kidney protective medication and follow up with kidney specialist if you are in agreement. Your creatinine and eGFR have both gotten worse in the past 4 months.   Thanks, Gwyneth Sprout, [address] [place], [place] [address] [phone] (phone) [phone] (fax) [place]

## 2021-09-14 ENCOUNTER — Telehealth: Payer: Self-pay

## 2021-09-14 DIAGNOSIS — R801 Persistent proteinuria, unspecified: Secondary | ICD-10-CM

## 2021-09-14 DIAGNOSIS — N1831 Chronic kidney disease, stage 3a: Secondary | ICD-10-CM

## 2021-09-14 MED ORDER — KERENDIA 10 MG PO TABS: 10.0000 mg | ORAL_TABLET | Freq: Every day | ORAL | 11 refills | Status: DC

## 2021-09-14 MED ORDER — KERENDIA 10 MG PO TABS: 10.0000 mg | ORAL_TABLET | Freq: Every day | ORAL | 3 refills | Status: DC

## 2021-09-14 NOTE — Progress Notes (Signed)
Chronic Care Management Pharmacy Assistant   Name: Joanna Bishop  MRN: [id number] DOB: August 24, 1955  Reason for Encounter: Asthma Check-in   I received a task from Junius Argyle, CPP requesting that I check-in with the patient regarding her Asthma.  Per CPP he would like me to :  Check in with patient to see if she has received Symbicort inhaler and started using it?  Per patient she has not received this inhaler from the [place] yet. She stated she is having car problems and hasn't had time to contact the [place] regarding this medication but she will try to get it done this week.  In the past two weeks, has the patient had:  Daytime asthma symptoms more than twice weekly? Per patient she is having symptoms but she is mostly staying inside to not have to many flare ups.         Any instance of night waking due to asthma? Patient is not walking or going outside much at the moment         Utilized albuterol reliever for symptoms more than twice weekly Patient is currently using her inhaler twice a day         Any activity limitation due to asthma? Yes patient is very limited at this time and she is staying inside most day to      avoid having issues  Medications: Outpatient Encounter Medications as of 09/14/2021  Medication Sig   albuterol (VENTOLIN HFA) 108 (90 Base) MCG/ACT inhaler INHALE 1-2 PUFFS INTO THE LUNGS EVERY 4 TO 6 HOURS AS NEEDED FOR WHEEZING OR SHORTNESS OF BREATH.   baclofen (LIORESAL) 10 MG tablet Take 1 tablet (10 mg total) by mouth 2 (two) times daily as needed for muscle spasms.   blood glucose meter kit and supplies Dispense based on patient and insurance preference. Use daily as directed. 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   budesonide-formoterol (SYMBICORT) 80-4.5 MCG/ACT inhaler Inhale 2 puffs into the lungs 2 (two) times daily.   Dulaglutide (TRULICITY) 3 MP/5.3IR SOPN Inject 3 mg as directed once a week.   Finerenone (KERENDIA) 10 MG TABS Take 10 mg by mouth daily.   glipiZIDE  (GLUCOTROL XL) 10 MG 24 hr tablet TAKE ONE TABLET BY MOUTH EVERY MORNING with breakfast   glucose blood (ONETOUCH VERIO) test strip Use to check blood sugars once daily as instructed   losartan (COZAAR) 50 MG tablet Take 1 tablet (50 mg total) by mouth daily.   montelukast (SINGULAIR) 10 MG tablet Take 1 tablet (10 mg total) by mouth daily.   Multiple Vitamin (MULTIVITAMIN WITH MINERALS) TABS tablet Take 1 tablet by mouth daily. Centrum Silver Women's 50+   NON FORMULARY Take 1 capsule by mouth in the morning and at bedtime. AZO Bladder Control Supplement   OneTouch Delica Lancets 44R MISC Use to check blood sugars once daily as directed.   oxybutynin (DITROPAN XL) 10 MG 24 hr tablet Take 1 tablet (10 mg total) by mouth at bedtime.   propranolol ER (INDERAL LA) 60 MG 24 hr capsule Take 1 capsule (60 mg total) by mouth daily.   Rimegepant Sulfate (NURTEC) 75 MG TBDP Take 1 mg by mouth daily as needed. At onset of migraine/aura   rosuvastatin (CRESTOR) 5 MG tablet Take 1 tablet (5 mg total) by mouth daily.   traMADol (ULTRAM) 50 MG tablet Take 1 tablet (50 mg total) by mouth every 6 (six) hours as needed.   [DISCONTINUED] XARELTO 10 MG TABS tablet Take 10  mg by mouth every morning.   No facility-administered encounter medications on file as of 09/14/2021.   Lynann Bologna, CPA/CMA Clinical Pharmacist Assistant Phone: [phone]

## 2021-09-14 NOTE — Addendum Note (Signed)
Addended by: Julious Payer A on: 09/14/2021 12:39 PM   Modules accepted: Orders

## 2021-10-02 ENCOUNTER — Telehealth: Payer: Self-pay

## 2021-10-02 ENCOUNTER — Ambulatory Visit: Payer: Medicare HMO

## 2021-10-02 ENCOUNTER — Other Ambulatory Visit: Payer: Self-pay | Admitting: Physician Assistant

## 2021-10-02 DIAGNOSIS — J4540 Moderate persistent asthma, uncomplicated: Secondary | ICD-10-CM

## 2021-10-02 DIAGNOSIS — E1122 Type 2 diabetes mellitus with diabetic chronic kidney disease: Secondary | ICD-10-CM

## 2021-10-02 DIAGNOSIS — I152 Hypertension secondary to endocrine disorders: Secondary | ICD-10-CM

## 2021-10-02 NOTE — Progress Notes (Signed)
    Chronic Care Management Pharmacy Assistant   Name: Joanna Bishop  MRN: [id number] DOB: 01-14-1956  Patient called on 09/29/2021 to be reminded of her telephone appointment with Angelena Sole, CPP on 10/02/2021 @1500 .   Patient aware of appointment date, time, and type of appointment (either telephone or in person). Patient aware to have/bring all medications, supplements, blood pressure and/or blood sugar logs to visit.   Star Rating Drug: Losartan 50 mg last filled on 09/12/2021 for a 30-Day Supply with [facility] Glipizide 10 mg last filled on 09/12/2021 for a 30-Day Supply with [facility] Rosuvastatin 5 mg last filled on 09/12/2021 for a 30-Day supply with [facility] Trulicity 3 mg patient receives this medication from the 09/14/2021 so not sure when the last fill date was   Any gaps in medications fill history? None   Care Gaps: COVID-19 Vaccine HIV Screening Diabetic Foot Exam Tetanus/TDAP Diabetic Eye Exam Mammogram Fecal DNA (Cologuard) PAP Smear  [place], CPA/CMA Clinical Pharmacist Assistant Phone: [phone]

## 2021-10-02 NOTE — Progress Notes (Signed)
Chronic Care Management Pharmacy Note  10/06/2021 Name:  Joanna Bishop MRN:  [id number] DOB:  Oct 04, 1955  Summary: Patient presents for CCM follow-up.   -Unable to afford Carrington Clamp, could consider SGLT-2 inhibitor given microabuminuria, defer for now.  -Continue current medications    Recommendations/Changes made from today's visit:   Plan:  CPP follow-up one month   Subjective: Joanna Bishop is an 66 y.o. year old female who is a primary patient of [name], [name], [place].  The CCM team was consulted for assistance with disease management and care coordination needs.    Engaged with patient by telephone for follow up visit in response to provider referral for pharmacy case management and/or care coordination services.   Consent to Services:  The patient was given information about Chronic Care Management services, agreed to services, and gave verbal consent prior to initiation of services.  Please see initial visit note for detailed documentation.   Patient Care Team: [name], PA-C as PCP - General (Physician Assistant) Ocie Doyne, [place] (Optometry) Germaine Pomfret, [facility] (Pharmacist)  Recent office visits: 09/06/21: Patient presented to Juleen China, PA-C for follow-up. A1c 6.0%. LDL 49. Microalbuminuria  05/12/21: Patient presented to [name], PA-C for follow-up. Aspirin stopped.   05/02/21: Patient presented to [name], PA-C for follow-up. Trulicity 3 mg weekly, losartan 50 mg daiyl started.   Recent consult visits: None noted.  Hospital visits: 3/10-3/13/23: Patient hospitalized for GI bleed.  03/09/21: Patient presented to ED due to abdominal pain.   Objective:  Lab Results  Component Value Date   CREATININE 1.17 (H) 09/06/2021   BUN 23 09/06/2021   GFRNONAA 60 (L) 03/09/2021   GFRAA >60 06/25/2019   NA 137 09/06/2021   K 4.5 09/06/2021   CALCIUM 9.5 09/06/2021   CO2 19 (L) 09/06/2021   GLUCOSE 159 (H) 09/06/2021    Lab Results   Component Value Date/Time   HGBA1C 6.0 (A) 09/06/2021 09:45 AM   HGBA1C 13.8 (A) 01/12/2021 01:33 PM   HGBA1C 8.8 (H) 03/12/2018 09:32 AM   HGBA1C 6.4 (H) 12/20/2017 09:24 AM    Last diabetic Eye exam:  Lab Results  Component Value Date/Time   HMDIABEYEEXA No Retinopathy 04/04/2020 12:00 AM    Last diabetic Foot exam: No results found for: "HMDIABFOOTEX"   Lab Results  Component Value Date   CHOL 129 09/06/2021   HDL 36 (L) 09/06/2021   LDLCALC 49 09/06/2021   TRIG 288 (H) 09/06/2021   CHOLHDL 3.6 09/06/2021       Latest Ref Rng & Units 09/06/2021   10:13 AM 03/09/2021    6:49 PM 01/26/2021   10:18 AM  Hepatic Function  Total Protein 6.0 - 8.5 g/dL 6.7  7.4  6.5   Albumin 3.9 - 4.9 g/dL 4.3  4.5  4.5   AST 0 - 40 IU/L _0 ALT 0 - 32 IU/L 25  33  38   Alk Phosphatase 44 - 121 IU/L 84  90  141   Total Bilirubin 0.0 - 1.2 mg/dL 0.7  1.2  0.7     Lab Results  Component Value Date/Time   TSH 2.510 10/14/2015 11:41 AM   TSH 3.030 07/12/2015 07:23 PM   TSH 1.730 09/13/2014 10:26 AM       Latest Ref Rng & Units 09/06/2021   10:13 AM 05/02/2021    2:33 PM 03/09/2021    6:49 PM  CBC  WBC 3.4 - 10.8 x10E3/uL  11.0  11.8  11.4   Hemoglobin 11.1 - 15.9 g/dL 13.1  13.8  13.5   Hematocrit 34.0 - 46.6 % 39.6  41.1  39.2   Platelets 150 - 450 x10E3/uL 243  216  257     Lab Results  Component Value Date/Time   VD25OH 29.0 (L) 10/14/2015 11:41 AM    Clinical ASCVD: No  The ASCVD Risk score (Arnett DK, et al., 2019) failed to calculate for the following reasons:   The valid total cholesterol range is 130 to 320 mg/dL       09/06/2021    9:04 AM 05/02/2021    1:28 PM 03/08/2021    9:05 AM  Depression screen PHQ 2/9  Decreased Interest 1 3 0  Down, Depressed, Hopeless 1 3 0  PHQ - 2 Score 2 6 0  Altered sleeping 1 3 0  Tired, decreased energy 1 3 0  Change in appetite 1 3 0  Feeling bad or failure about yourself  1 3 0  Trouble concentrating 1 3 0  Moving slowly  or fidgety/restless 0 3 0  Suicidal thoughts 0 1 0  PHQ-9 Score 7 25 0  Difficult doing work/chores Somewhat difficult Extremely dIfficult Not difficult at all    Social History   Tobacco Use  Smoking Status Never  Smokeless Tobacco Never   BP Readings from Last 3 Encounters:  09/06/21 132/86  06/14/21 120/70  05/12/21 138/83   Pulse Readings from Last 3 Encounters:  09/06/21 (!) 108  06/14/21 (!) 111  05/12/21 80   Wt Readings from Last 3 Encounters:  09/06/21 217 lb 3.2 oz (98.5 kg)  06/14/21 213 lb 3.2 oz (96.7 kg)  05/12/21 214 lb (97.1 kg)   BMI Readings from Last 3 Encounters:  09/06/21 37.28 kg/m  06/14/21 36.60 kg/m  05/12/21 36.73 kg/m    Assessment/Interventions: Review of patient past medical history, allergies, medications, health status, including review of consultants reports, laboratory and other test data, was performed as part of comprehensive evaluation and provision of chronic care management services.   SDOH:  (Social Determinants of Health) assessments and interventions performed: Yes      SDOH Screenings   Alcohol Screen: Low Risk  (09/06/2021)   Alcohol Screen    Last Alcohol Screening Score (AUDIT): 1  Depression (PHQ2-9): Medium Risk (09/06/2021)   Depression (PHQ2-9)    PHQ-2 Score: 7  Financial Resource Strain: Low Risk  (05/15/2021)   Overall Financial Resource Strain (CARDIA)    Difficulty of Paying Living Expenses: Not hard at all  Food Insecurity: No Food Insecurity (03/08/2021)   Hunger Vital Sign    Worried About Running Out of Food in the Last Year: Never true    [place] in the Last Year: Never true  Housing: Low Risk  (03/08/2021)   Housing    Last Housing Risk Score: 0  Physical Activity: Insufficiently Active (03/08/2021)   Exercise Vital Sign    Days of Exercise per Week: 4 days    Minutes of Exercise per Session: 10 min  Social Connections: Socially Isolated (03/08/2021)   Social Connection and Isolation Panel  [NHANES]    Frequency of Communication with Friends and Family: More than three times a week    Frequency of Social Gatherings with Friends and Family: Twice a week    Attends Religious Services: Never    Marine scientist or Organizations: No    Attends Archivist Meetings: Never  Marital Status: Widowed  Stress: No Stress Concern Present (03/08/2021)   [place]    Feeling of Stress : Not at all  Tobacco Use: Low Risk  (09/06/2021)   Patient History    Smoking Tobacco Use: Never    Smokeless Tobacco Use: Never    Passive Exposure: Not on file  Transportation Needs: No Transportation Needs (03/08/2021)   PRAPARE - Transportation    Lack of Transportation (Medical): No    Lack of Transportation (Non-Medical): No    CCM Care Plan  Allergies  Allergen Reactions   Penicillins Swelling    Medications Reviewed Today     Reviewed by [name], PA-C (Physician Assistant Certified) on 20/25/42 at [place] List Status: <None>   Medication Order Taking? Sig Documenting Provider Last Dose Status Informant  albuterol (VENTOLIN HFA) 108 (90 Base) MCG/ACT inhaler [id number] Yes INHALE 1-2 PUFFS INTO THE LUNGS EVERY 4 TO 6 HOURS AS NEEDED FOR WHEEZING OR SHORTNESS OF BREATH. [name], PA-C Taking Active   baclofen (LIORESAL) 10 MG tablet [id number] Yes Take 1 tablet (10 mg total) by mouth 2 (two) times daily as needed for muscle spasms. Danton Clap, PA-C Taking Active   blood glucose meter kit and supplies [id number] Yes Dispense based on patient and insurance preference. Use daily as directed. 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 Virginia Crews, MD Taking Active   budesonide-formoterol [facility]) 80-4.5 MCG/ACT inhaler [id number] Yes Inhale 2 puffs into the lungs 2 (two) times daily. [name], PA-C Taking Active   Dulaglutide (TRULICITY) 3 JK/0.9FG SOPN [id number] Yes Inject 3 mg as directed once a  week. [name], PA-C Taking Active   glipiZIDE (GLUCOTROL XL) 10 MG 24 hr tablet [id number] Yes TAKE ONE TABLET BY MOUTH EVERY MORNING with breakfast [name], PA-C Taking Active   glucose blood (ONETOUCH VERIO) test strip [id number] Yes Use to check blood sugars once daily as instructed Bacigalupo, Dionne Bucy, MD Taking Active   losartan (COZAAR) 50 MG tablet [id number] Yes Take 1 tablet (50 mg total) by mouth daily. [name], PA-C Taking Active   montelukast (SINGULAIR) 10 MG tablet [id number] Yes Take 1 tablet (10 mg total) by mouth daily. [name], PA-C Taking Active   Multiple Vitamin (MULTIVITAMIN WITH MINERALS) TABS tablet [id number] Yes Take 1 tablet by mouth daily. Centrum Silver Women's 50+ [provider] Taking Active   NON FORMULARY [id number] Yes Take 1 capsule by mouth in the morning and at bedtime. AZO Bladder Control Supplement [provider] Taking Active   OneTouch Delica Lancets 58K MISC [id number] Yes Use to check blood sugars once daily as directed. Virginia Crews, MD Taking Active   oxybutynin (DITROPAN XL) 10 MG 24 hr tablet [id number] Yes Take 1 tablet (10 mg total) by mouth at bedtime. [name], PA-C Taking Active   propranolol ER (INDERAL LA) 60 MG 24 hr capsule [id number] Yes Take 1 capsule (60 mg total) by mouth daily. [name], PA-C  Active   Rimegepant Sulfate (NURTEC) 75 MG TBDP [id number] Yes Take 1 mg by mouth daily as needed. At onset of migraine/aura [name], PA-C Taking Active   rosuvastatin (CRESTOR) 5 MG tablet [id number] Yes Take 1 tablet (5 mg total) by mouth daily. [name], PA-C Taking Active   traMADol (ULTRAM) 50 MG tablet [id number] Yes Take 1 tablet (50 mg total) by mouth every 6 (six) hours as needed. [name], PA-C Taking Active     Discontinued  06/25/19 1840             Patient Active Problem List   Diagnosis Date Noted   Migraine with aura and without status  migrainosus, not intractable 06/14/2021   Peptic ulcer disease with hemorrhage 05/12/2021   Anemia due to acute blood loss 05/12/2021   Hypertension associated with diabetes ([place]) 05/02/2021   Chronic 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 05/02/2021   OAB (overactive bladder) 01/12/2021   History of total knee arthroplasty 05/09/2020   Morbid obesity ([place]) 08/28/2019   Stiffness of right knee 06/18/2018   Type 2 diabetes mellitus with stage 3a chronic kidney disease, without long-term current use of insulin ([place]) 10/17/2015   Carpal tunnel syndrome of left wrist 10/14/2015   Bipolar depression ([place]) 07/12/2015   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   Asthmatic bronchitis 08/02/2014   Hyperlipidemia associated with type 2 diabetes mellitus ([place]) 08/02/2014   History of suicidal ideation 08/02/2014   Avitaminosis D 08/02/2014   Generalized hyperhidrosis 08/02/2014   Airway hyperreactivity 08/02/2014   Anxiety disorder 08/02/2014   Insomnia 08/02/2014   Major depressive disorder, single episode 08/02/2014   Shortness of breath 08/05/2009    Immunization History  Administered Date(s) Administered   Influenza,inj,Quad PF,6+ Mos 01/06/2018, 12/12/2018, 05/02/2021   Pneumococcal Polysaccharide-23 07/30/2012   Tdap 07/28/2009    Conditions to be addressed/monitored:  Hyperlipidemia, Diabetes, Asthma, Overactive Bladder, and Bipolar Disorder, Chronic Pain, Chronic Migraines  Care Plan : General Pharmacy (Adult)  Updates made by Germaine Pomfret, RPH since 10/06/2021 12:00 AM     Problem: Hyperlipidemia, Diabetes, Asthma, Overactive Bladder, and Bipolar Disorder, Chronic Pain, Chronic Migraines   Priority: High     Long-Range Goal: Patient-Specific Goal   Start Date: 01/23/2021  Expected End Date: 08/31/2022  This Visit's Progress: On track  Recent Progress: On track  Priority: High  Note:   Current Barriers:  Unable to independently afford treatment  regimen Unable to achieve control of diabetes   Pharmacist Clinical Goal(s):  Patient will verbalize ability to afford treatment regimen achieve control of Diabetes as evidenced by A1c less than 8% through collaboration with PharmD and provider.   Interventions: 1:1 collaboration with [name], PA-C regarding development and update of comprehensive plan of care as evidenced by provider attestation and co-signature Inter-disciplinary care team collaboration (see longitudinal plan of care) Comprehensive medication review performed; medication list updated in electronic medical record  Hypertension (BP goal <130/80) -Controlled: Not addressed during this visit. -Current treatment: Losartan 50 mg daily: Appropriate, Query effective  Propanolol ER 60 mg daily  -Medications previously tried: NA  -Current home readings (utilizes wrist monitor):  Did not have readings with her today.  -Denies hypotensive/hypertensive symptoms Continue current medications  Hyperlipidemia: (LDL goal < 70) -Uncontrolled: Not addressed during this visit. -Current treatment: Rosuvastatin 5 mg daily (AM) -Medications previously tried: NA  -Recommended to continue current medication  Diabetes (A1c goal <7%) -Controlled -Current medications: Glipizide XL 10 mg daily: Appropriate, Query effective  Trulicity 3 mg weekly: Appropriate, Query effective -Medications previously tried: Metformin (sluggish, stomach pain), finerenone (cost)   -Current home glucose readings fasting glucose: 115, 123, 120. Highest in recall was 147  -Denies hypoglycemia symptoms:   -Current meal patterns: Appetite significantly reduced. Struggles with sweet tooth. Endorses indulging in sweets once monthly. Pastries  -Current exercise: Limited by leg pain following knee replacement/femur fracture. Chair exercises 3 times weekly.  -Unable to afford Carrington Clamp, could consider SGLT-2 inhibitor given microabuminuria, defer  for now.   -Continue current medications   Asthma (Goal: control symptoms and prevent exacerbations) -Improved -Current treatment  Ventolin HFA 1-2 puffs every 4-6 hours as needed Symbicort 80-4.5 2 puffs twice daily  Montelukast 10 mg daily  -Medications previously tried: NA  -Exacerbations requiring treatment in last 6 months: None In the past four weeks, has the patient had:  Daytime asthma symptoms more than twice weekly?   No Any instance of night waking due to asthma?   No Utilized albuterol reliever for symptoms more than twice weekly? No: 2-3 x in past month  Any activity limitation due to asthma?    Yes -Patient reports consistent use of maintenance inhaler.   Bipolar Disorder (Goal: Achieve symptom remission) -Not ideally controlled: Not addressed during this visit. -Current treatment: None  -Medications previously tried/failed: Divalproex, Benztropine, Wellbutrin, Citalopram, Sertraline  -Counseled on importance of keeping upcoming psychiatry appointment   Migraines (Goal: Prevent migraines) -Uncontrolled -Current treatment  Propanolol ER 60 mg daily  Rimegepant 75 mg daily as needed: Appropriate, Query effective  -Medications previously tried: Divalproex, Excedrin (GI Bleed) -Migraines imrpvoed overall, still has stress headaches   Chronic Pain (Goal: Minimize symptoms) -Controlled: Not addressed during this visit. -Managed by Carlynn Spry, PA-C (Ortho) -Current treatment  Baclofen 10 mg twice daily as needed  Boswellia extract 500 mg twice daily  Tramadol 50 mg q6h PRN  -Medications previously tried: NA -Patient also reports CBD gummy use.  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  -Requires rare use of tramadol   -Recommended to continue current medication  Overactive Bladder (Goal: Minimize symptoms) -Controlled -Current treatment  Oxybutynin XL 10 mg daily  -Medications previously tried: NA -Excellent symptomatic  control.   -Recommended to continue current medication  Patient Goals/Self-Care Activities Patient will:  - check glucose daily before breakfast, document, and provide at future appointments  Follow Up Plan: Telephone follow up appointment with care management team member scheduled for:  11/07/21 at 11:00 AM    Medication Assistance: None required.  Patient affirms current coverage meets needs. Patient receives Incr[employer], Musician through [employer].   Compliance/Adherence/Medication fill history: Care Gaps: Covid Vaccine  HIV Screening  Shingrix  Pneumonia  Foot Exam  Influenza   Star-Rating Drugs: Rosuvastatin 5 mg: LF 08/15/21 for 30-DS from [facility]  Patient's preferred pharmacy is:  [facility] - [place], [place] - [address] [place] [address] Phone: [phone] Fax: [phone]  [facility] [place], [place] [address] [facility] [address] [place] [address] Phone: [phone] Fax: [phone]  [id number] [place], [place] [id number]: [phone]: [phone] Uses pill box? Yes Pt endorses 100% compliance  Patient decided to: Utilize [facility] for medication synchronization, packaging and delivery  Care Plan and Follow Up Patient Decision:  Patient agrees to Care Plan and Follow-up.  Plan: Telephone follow up appointment with care management team member scheduled for:  11/07/21 at 11:00 AM  AJunius Argyle PharmD, BPara [place] [phone]

## 2021-10-06 NOTE — Patient Instructions (Signed)
Visit Information It was great speaking with you today!  Please let me know if you have any questions about our visit.   Goals Addressed             This Visit's Progress    Monitor and Manage My Blood Sugar-Diabetes Type 2   On track    Timeframe:  Long-Range Goal Priority:  High Start Date: 01/23/2021                            Expected End Date: 01/24/2023                      Follow Up within 30 days   - check blood sugar before breakfast - check blood sugar if I feel it is too high or too low - enter blood sugar readings and medication or insulin into daily log - take the blood sugar log to all doctor visits    Why is this important?   Checking your blood sugar at home helps to keep it from getting very high or very low.  Writing the results in a diary or log helps the doctor know how to care for you.  Your blood sugar log should have the time, date and the results.  Also, write down the amount of insulin or other medicine that you take.  Other information, like what you ate, exercise done and how you were feeling, will also be helpful.     Notes:         Patient Care Plan: General Pharmacy (Adult)     Problem Identified: Hyperlipidemia, Diabetes, Asthma, Overactive Bladder, and Bipolar Disorder, Chronic Pain, Chronic Migraines   Priority: High     Long-Range Goal: Patient-Specific Goal   Start Date: 01/23/2021  Expected End Date: 08/31/2022  This Visit's Progress: On track  Recent Progress: On track  Priority: High  Note:   Current Barriers:  Unable to independently afford treatment regimen Unable to achieve control of diabetes   Pharmacist Clinical Goal(s):  Patient will verbalize ability to afford treatment regimen achieve control of Diabetes as evidenced by A1c less than 8% through collaboration with PharmD and provider.   Interventions: 1:1 collaboration with Alfredia Ferguson, PA-C regarding development and update of comprehensive plan of care as  evidenced by provider attestation and co-signature Inter-disciplinary care team collaboration (see longitudinal plan of care) Comprehensive medication review performed; medication list updated in electronic medical record  Hypertension (BP goal <130/80) -Controlled: Not addressed during this visit. -Current treatment: Losartan 50 mg daily: Appropriate, Query effective  Propanolol ER 60 mg daily  -Medications previously tried: NA  -Current home readings (utilizes wrist monitor):  Did not have readings with her today.  -Denies hypotensive/hypertensive symptoms Continue current medications  Hyperlipidemia: (LDL goal < 70) -Uncontrolled: Not addressed during this visit. -Current treatment: Rosuvastatin 5 mg daily (AM) -Medications previously tried: NA  -Recommended to continue current medication  Diabetes (A1c goal <7%) -Controlled -Current medications: Glipizide XL 10 mg daily: Appropriate, Query effective  Trulicity 3 mg weekly: Appropriate, Query effective -Medications previously tried: Metformin (sluggish, stomach pain), finerenone (cost)   -Current home glucose readings fasting glucose: 115, 123, 120. Highest in recall was 147  -Denies hypoglycemia symptoms:   -Current meal patterns: Appetite significantly reduced. Struggles with sweet tooth. Endorses indulging in sweets once monthly. Pastries  -Current exercise: Limited by leg pain following knee replacement/femur fracture. Chair exercises 3 times weekly.  -Unable  to afford Chauncey Mann, could consider SGLT-2 inhibitor given microabuminuria, defer for now.  -Continue current medications   Asthma (Goal: control symptoms and prevent exacerbations) -Improved -Current treatment  Ventolin HFA 1-2 puffs every 4-6 hours as needed Symbicort 80-4.5 2 puffs twice daily  Montelukast 10 mg daily  -Medications previously tried: NA  -Exacerbations requiring treatment in last 6 months: None In the past four weeks, has the patient had:   Daytime asthma symptoms more than twice weekly?   No Any instance of night waking due to asthma?   No Utilized albuterol reliever for symptoms more than twice weekly? No: 2-3 x in past month  Any activity limitation due to asthma?    Yes -Patient reports consistent use of maintenance inhaler.   Bipolar Disorder (Goal: Achieve symptom remission) -Not ideally controlled: Not addressed during this visit. -Current treatment: None  -Medications previously tried/failed: Divalproex, Benztropine, Wellbutrin, Citalopram, Sertraline  -Counseled on importance of keeping upcoming psychiatry appointment   Migraines (Goal: Prevent migraines) -Uncontrolled -Current treatment  Propanolol ER 60 mg daily  Rimegepant 75 mg daily as needed: Appropriate, Query effective  -Medications previously tried: Divalproex, Excedrin (GI Bleed) -Migraines imrpvoed overall, still has stress headaches   Chronic Pain (Goal: Minimize symptoms) -Controlled: Not addressed during this visit. -Managed by Altamese Cabal, PA-C (Ortho) -Current treatment  Baclofen 10 mg twice daily as needed  Boswellia extract 500 mg twice daily  Tramadol 50 mg q6h PRN  -Medications previously tried: NA -Patient also reports CBD gummy use.  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  -Requires rare use of tramadol   -Recommended to continue current medication  Overactive Bladder (Goal: Minimize symptoms) -Controlled -Current treatment  Oxybutynin XL 10 mg daily  -Medications previously tried: NA -Excellent symptomatic control.   -Recommended to continue current medication  Patient Goals/Self-Care Activities Patient will:  - check glucose daily before breakfast, document, and provide at future appointments  Follow Up Plan: Telephone follow up appointment with care management team member scheduled for:  11/07/21 at 11:00 AM      Patient agreed to services and verbal consent obtained.    Patient verbalizes understanding of instructions and care plan provided today and agrees to view in MyChart. Active MyChart status and patient understanding of how to access instructions and care plan via MyChart confirmed with patient.     Angelena Sole, PharmD, Patsy [place], CPP  Clinical Pharmacist Practitioner  [facility] [phone]

## 2021-10-06 NOTE — Addendum Note (Signed)
Addended by: Julious Payer A on: 10/06/2021 02:53 PM   Modules accepted: Orders

## 2021-10-09 ENCOUNTER — Telehealth: Payer: Self-pay

## 2021-10-09 NOTE — Progress Notes (Signed)
Chronic Care Management Pharmacy Assistant   Name: Joanna Bishop  MRN: [id number] DOB: April 19, 1955  Reason for Encounter: Medication Review/Medication Coordination Call for [facility]   Recent office visits:  None ID  Recent consult visits:  None ID  Hospital visits:  None in previous 6 months  Medications: Outpatient Encounter Medications as of 10/09/2021  Medication Sig   acetaminophen (TYLENOL) 650 MG CR tablet Take 650 mg by mouth every 8 (eight) hours as needed for pain.   albuterol (VENTOLIN HFA) 108 (90 Base) MCG/ACT inhaler INHALE 1-2 PUFFS INTO THE LUNGS EVERY 4 TO 6 HOURS AS NEEDED FOR WHEEZING OR SHORTNESS OF BREATH.   baclofen (LIORESAL) 10 MG tablet Take 1 tablet (10 mg total) by mouth 2 (two) times daily as needed for muscle spasms.   blood glucose meter kit and supplies Dispense based on patient and insurance preference. Use daily as directed. 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   budesonide-formoterol (SYMBICORT) 80-4.5 MCG/ACT inhaler Inhale 2 puffs into the lungs 2 (two) times daily.   Dulaglutide (TRULICITY) 3 NA/3.5TD SOPN Inject 3 mg as directed once a week.   glipiZIDE (GLUCOTROL XL) 10 MG 24 hr tablet TAKE ONE TABLET BY MOUTH EVERY MORNING   glucose blood (ONETOUCH VERIO) test strip Use to check blood sugars once daily as instructed   losartan (COZAAR) 50 MG tablet TAKE ONE TABLET BY MOUTH ONCE DAILY   montelukast (SINGULAIR) 10 MG tablet Take 1 tablet (10 mg total) by mouth daily.   Multiple Vitamin (MULTIVITAMIN WITH MINERALS) TABS tablet Take 1 tablet by mouth daily. Centrum Silver Women's 50+   NON FORMULARY Take 1 capsule by mouth in the morning and at bedtime. AZO Bladder Control Supplement   OneTouch Delica Lancets 32K MISC Use to check blood sugars once daily as directed.   oxybutynin (DITROPAN XL) 10 MG 24 hr tablet Take 1 tablet (10 mg total) by mouth at bedtime.   propranolol ER (INDERAL LA) 60 MG 24 hr capsule Take 1 capsule (60 mg total) by mouth daily.    Rimegepant Sulfate (NURTEC) 75 MG TBDP Take 1 mg by mouth daily as needed. At onset of migraine/aura   rosuvastatin (CRESTOR) 5 MG tablet Take 1 tablet (5 mg total) by mouth daily.   traMADol (ULTRAM) 50 MG tablet Take 1 tablet (50 mg total) by mouth every 6 (six) hours as needed.   [DISCONTINUED] XARELTO 10 MG TABS tablet Take 10 mg by mouth every morning.   No facility-administered encounter medications on file as of 10/09/2021.   Care Gaps: COVID-19 Vaccine HIV Screening Diabetic Foot Exam Tetanus/TDAP Diabetic Eye Exam Mammogram Fecal DNA (Cologuard) PAP Smear  Star Rating Drugs: Losartan 50 mg last filled on 09/12/2021 for a 30-Day Supply with [facility] Glipizide 10 mg last filled on 09/12/2021 for a 30-Day Supply with [facility] Rosuvastatin 5 mg last filled on 09/12/2021 for a 30-Day supply with [facility] Trulicity 3 mg patient receives this medication from the [place] so not sure when the last fill date was  BP Readings from Last 3 Encounters:  09/06/21 132/86  06/14/21 120/70  05/12/21 138/83    Lab Results  Component Value Date   HGBA1C 6.0 (A) 09/06/2021    Patient obtains medications through Vials  30 Days   Last adherence delivery included:  Losartan 50 mg 1 tablet daily (Breakfast) Albuterol 108 Inhaler inhale 1-2 puffs every every 4-6 hours prn for wheezing or shortness of breath Glipizide 10 mg 1 tablet daily (Breakfast)  Montelukast 10 mg 1 tablet daily (Bedtime) Rosuvastatin 5 mg 1 tablet daily (Breakfast) Oxybutynin 10 mg 1 tablet daily (Bedtime) Propanolol ER 60 mg daily   Patient declined medications last month: Nurtec 75 mg 1 tablet as needed at onset of migraine-Patient receives this medication through [facility] Trulicity Inject 1.5 mg into the skin once a week- Patient is getting this medication through [facility] Symbicort 80-4.5 mcg Inhaler Inhale 2 puffs twice daily-Patient will be getting this medication from [facility]  Patient  is due for next adherence delivery on: 10/18/2021 1[address] Route.  Called patient and reviewed medications and coordinated delivery.  This delivery to include:  Losartan 50 mg 1 tablet daily (Breakfast) Albuterol 108 Inhaler inhale 1-2 puffs every every 4-6 hours prn for wheezing or shortness of breath Glipizide 10 mg 1 tablet daily (Breakfast) Montelukast 10 mg 1 tablet daily (Bedtime) Rosuvastatin 5 mg 1 tablet daily (Breakfast) Oxybutynin 10 mg 1 tablet daily (Bedtime) Propanolol ER 60 mg daily  OneTouch Test Strips OneTouch Lancets  Patient declined the following medications: Nurtec 75 mg 1 tablet as needed at onset of migraine-Patient receives this medication through [facility] Trulicity Inject 1.5 mg into the skin once a week- Patient is getting this medication through [facility] Symbicort 80-4.5 mcg Inhaler Inhale 2 puffs twice daily-Patient will be getting this medication from [facility]  Patient needs refills for: No refills needed for this delivery.  Confirmed delivery date of 10/18/2021 [address], advised patient that pharmacy will contact them the morning of delivery.  Patient has a follow-up telephone appointment with Junius Argyle, CPP on 11/07/2021 @ [place], CPA/CMA Catering manager Phone: [phone]

## 2021-10-18 ENCOUNTER — Ambulatory Visit: Payer: Medicare HMO | Admitting: Physician Assistant

## 2021-11-05 ENCOUNTER — Other Ambulatory Visit: Payer: Self-pay | Admitting: Physician Assistant

## 2021-11-05 DIAGNOSIS — J4540 Moderate persistent asthma, uncomplicated: Secondary | ICD-10-CM

## 2021-11-07 ENCOUNTER — Ambulatory Visit (INDEPENDENT_AMBULATORY_CARE_PROVIDER_SITE_OTHER): Payer: Medicare HMO

## 2021-11-07 DIAGNOSIS — I152 Hypertension secondary to endocrine disorders: Secondary | ICD-10-CM

## 2021-11-07 DIAGNOSIS — E1169 Type 2 diabetes mellitus with other specified complication: Secondary | ICD-10-CM

## 2021-11-07 DIAGNOSIS — G43109 Migraine with aura, not intractable, without status migrainosus: Secondary | ICD-10-CM

## 2021-11-07 DIAGNOSIS — J4540 Moderate persistent asthma, uncomplicated: Secondary | ICD-10-CM

## 2021-11-07 DIAGNOSIS — N1831 Chronic kidney disease, stage 3a: Secondary | ICD-10-CM

## 2021-11-07 NOTE — Progress Notes (Signed)
Chronic Care Management Pharmacy Note  11/07/2021 Name:  Joanna Bishop MRN:  [id number] DOB:  07-Mar-1955  Summary: Patient presents for CCM follow-up. Migraines greatly improved as well as her blood pressure. Asthma symptoms are likewise in much better control.   Recommendations/Changes made from today's visit: Continue current medications  Plan: CPP follow-up one month   Subjective: Joanna Bishop is an 66 y.o. year old female who is a primary patient of [name], [name], [place].  The CCM team was consulted for assistance with disease management and care coordination needs.    Engaged with patient by telephone for follow up visit in response to provider referral for pharmacy case management and/or care coordination services.   Consent to Services:  The patient was given information about Chronic Care Management services, agreed to services, and gave verbal consent prior to initiation of services.  Please see initial visit note for detailed documentation.   Patient Care Team: [name], PA-C as PCP - General (Physician Assistant) Ocie Doyne, [place] (Optometry) Germaine Pomfret, [facility] (Pharmacist)  Recent office visits: 09/06/21: Patient presented to Juleen China, PA-C for follow-up. A1c 6.0%. LDL 49. Microalbuminuria  05/12/21: Patient presented to [name], PA-C for follow-up. Aspirin stopped.   05/02/21: Patient presented to [name], PA-C for follow-up. Trulicity 3 mg weekly, losartan 50 mg daiyl started.   Recent consult visits: None noted.  Hospital visits: 3/10-3/13/23: Patient hospitalized for GI bleed.  03/09/21: Patient presented to ED due to abdominal pain.   Objective:  Lab Results  Component Value Date   CREATININE 1.17 (H) 09/06/2021   BUN 23 09/06/2021   GFRNONAA 60 (L) 03/09/2021   GFRAA >60 06/25/2019   NA 137 09/06/2021   K 4.5 09/06/2021   CALCIUM 9.5 09/06/2021   CO2 19 (L) 09/06/2021   GLUCOSE 159 (H) 09/06/2021    Lab Results   Component Value Date/Time   HGBA1C 6.0 (A) 09/06/2021 09:45 AM   HGBA1C 13.8 (A) 01/12/2021 01:33 PM   HGBA1C 8.8 (H) 03/12/2018 09:32 AM   HGBA1C 6.4 (H) 12/20/2017 09:24 AM    Last diabetic Eye exam:  Lab Results  Component Value Date/Time   HMDIABEYEEXA No Retinopathy 04/04/2020 12:00 AM    Last diabetic Foot exam: No results found for: "HMDIABFOOTEX"   Lab Results  Component Value Date   CHOL 129 09/06/2021   HDL 36 (L) 09/06/2021   LDLCALC 49 09/06/2021   TRIG 288 (H) 09/06/2021   CHOLHDL 3.6 09/06/2021       Latest Ref Rng & Units 09/06/2021   10:13 AM 03/09/2021    6:49 PM 01/26/2021   10:18 AM  Hepatic Function  Total Protein 6.0 - 8.5 g/dL 6.7  7.4  6.5   Albumin 3.9 - 4.9 g/dL 4.3  4.5  4.5   AST 0 - 40 IU/L _0 ALT 0 - 32 IU/L 25  33  38   Alk Phosphatase 44 - 121 IU/L 84  90  141   Total Bilirubin 0.0 - 1.2 mg/dL 0.7  1.2  0.7     Lab Results  Component Value Date/Time   TSH 2.510 10/14/2015 11:41 AM   TSH 3.030 07/12/2015 07:23 PM   TSH 1.730 09/13/2014 10:26 AM       Latest Ref Rng & Units 09/06/2021   10:13 AM 05/02/2021    2:33 PM 03/09/2021    6:49 PM  CBC  WBC 3.4 - 10.8 x10E3/uL 11.0  11.8  11.4   Hemoglobin 11.1 - 15.9 g/dL 13.1  13.8  13.5   Hematocrit 34.0 - 46.6 % 39.6  41.1  39.2   Platelets 150 - 450 x10E3/uL 243  216  257     Lab Results  Component Value Date/Time   VD25OH 29.0 (L) 10/14/2015 11:41 AM    Clinical ASCVD: No  The ASCVD Risk score (Arnett DK, et al., 2019) failed to calculate for the following reasons:   The valid total cholesterol range is 130 to 320 mg/dL       09/06/2021    9:04 AM 05/02/2021    1:28 PM 03/08/2021    9:05 AM  Depression screen PHQ 2/9  Decreased Interest 1 3 0  Down, Depressed, Hopeless 1 3 0  PHQ - 2 Score 2 6 0  Altered sleeping 1 3 0  Tired, decreased energy 1 3 0  Change in appetite 1 3 0  Feeling bad or failure about yourself  1 3 0  Trouble concentrating 1 3 0  Moving slowly  or fidgety/restless 0 3 0  Suicidal thoughts 0 1 0  PHQ-9 Score 7 25 0  Difficult doing work/chores Somewhat difficult Extremely dIfficult Not difficult at all    Social History   Tobacco Use  Smoking Status Never  Smokeless Tobacco Never   BP Readings from Last 3 Encounters:  09/06/21 132/86  06/14/21 120/70  05/12/21 138/83   Pulse Readings from Last 3 Encounters:  09/06/21 (!) 108  06/14/21 (!) 111  05/12/21 80   Wt Readings from Last 3 Encounters:  09/06/21 217 lb 3.2 oz (98.5 kg)  06/14/21 213 lb 3.2 oz (96.7 kg)  05/12/21 214 lb (97.1 kg)   BMI Readings from Last 3 Encounters:  09/06/21 37.28 kg/m  06/14/21 36.60 kg/m  05/12/21 36.73 kg/m    Assessment/Interventions: Review of patient past medical history, allergies, medications, health status, including review of consultants reports, laboratory and other test data, was performed as part of comprehensive evaluation and provision of chronic care management services.   SDOH:  (Social Determinants of Health) assessments and interventions performed: Yes SDOH Interventions    Flowsheet Row Chronic Care Management from 05/15/2021 in [place] Management from 03/20/2021 in [place] from 03/08/2021 in [place] Management from 02/21/2021 in [place] Management from 01/23/2021 in [place] Visit from 02/28/2017 in [place] Interventions        Food Insecurity Interventions -- -- Intervention Not Indicated -- -- --  Transportation Interventions -- -- Intervention Not Indicated -- -- --  Depression Interventions/Treatment  -- -- -- -- -- Currently on Treatment  Financial Strain Interventions Intervention Not Indicated Intervention Not Indicated Intervention Not Indicated Intervention Not Indicated Other (Comment)  [PAP] --  Physical Activity Interventions --  -- Intervention Not Indicated -- -- --  Stress Interventions -- -- Intervention Not Indicated -- -- --  Social Connections Interventions -- -- Intervention Not Indicated -- -- --          SDOH Screenings   Food Insecurity: No Food Insecurity (03/08/2021)  Housing: Low Risk  (03/08/2021)  Transportation Needs: No Transportation Needs (03/08/2021)  Alcohol Screen: Low Risk  (09/06/2021)  Depression (PHQ2-9): Medium Risk (09/06/2021)  Financial Resource Strain: Low Risk  (05/15/2021)  Physical Activity: Insufficiently Active (03/08/2021)  Social Connections: Socially Isolated (03/08/2021)  Stress: No Stress Concern Present (03/08/2021)  Tobacco  Use: Low Risk  (09/06/2021)    CCM Care Plan  Allergies  Allergen Reactions   Penicillins Swelling    Medications Reviewed Today     Reviewed by [name], PA-C (Physician Assistant Certified) on 16/38/46 at [place] List Status: <None>   Medication Order Taking? Sig Documenting Provider Last Dose Status Informant  albuterol (VENTOLIN HFA) 108 (90 Base) MCG/ACT inhaler [id number] Yes INHALE 1-2 PUFFS INTO THE LUNGS EVERY 4 TO 6 HOURS AS NEEDED FOR WHEEZING OR SHORTNESS OF BREATH. [name], PA-C Taking Active   baclofen (LIORESAL) 10 MG tablet [id number] Yes Take 1 tablet (10 mg total) by mouth 2 (two) times daily as needed for muscle spasms. Danton Clap, PA-C Taking Active   blood glucose meter kit and supplies [id number] Yes Dispense based on patient and insurance preference. Use daily as directed. 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 Virginia Crews, MD Taking Active   budesonide-formoterol [facility]) 80-4.5 MCG/ACT inhaler [id number] Yes Inhale 2 puffs into the lungs 2 (two) times daily. [name], PA-C Taking Active   Dulaglutide (TRULICITY) 3 YB/6.3SL SOPN [id number] Yes Inject 3 mg as directed once a week. [name], PA-C Taking Active   glipiZIDE (GLUCOTROL XL) 10 MG 24 hr tablet [id number] Yes TAKE ONE TABLET BY MOUTH EVERY  MORNING with breakfast [name], PA-C Taking Active   glucose blood (ONETOUCH VERIO) test strip [id number] Yes Use to check blood sugars once daily as instructed Bacigalupo, Dionne Bucy, MD Taking Active   losartan (COZAAR) 50 MG tablet [id number] Yes Take 1 tablet (50 mg total) by mouth daily. [name], PA-C Taking Active   montelukast (SINGULAIR) 10 MG tablet [id number] Yes Take 1 tablet (10 mg total) by mouth daily. [name], PA-C Taking Active   Multiple Vitamin (MULTIVITAMIN WITH MINERALS) TABS tablet [id number] Yes Take 1 tablet by mouth daily. Centrum Silver Women's 50+ [provider] Taking Active   NON FORMULARY [id number] Yes Take 1 capsule by mouth in the morning and at bedtime. AZO Bladder Control Supplement [provider] Taking Active   OneTouch Delica Lancets 82C MISC [id number] Yes Use to check blood sugars once daily as directed. Virginia Crews, MD Taking Active   oxybutynin (DITROPAN XL) 10 MG 24 hr tablet [id number] Yes Take 1 tablet (10 mg total) by mouth at bedtime. [name], PA-C Taking Active   propranolol ER (INDERAL LA) 60 MG 24 hr capsule [id number] Yes Take 1 capsule (60 mg total) by mouth daily. [name], PA-C  Active   Rimegepant Sulfate (NURTEC) 75 MG TBDP [id number] Yes Take 1 mg by mouth daily as needed. At onset of migraine/aura [name], PA-C Taking Active   rosuvastatin (CRESTOR) 5 MG tablet [id number] Yes Take 1 tablet (5 mg total) by mouth daily. [name], PA-C Taking Active   traMADol (ULTRAM) 50 MG tablet [id number] Yes Take 1 tablet (50 mg total) by mouth every 6 (six) hours as needed. [name], PA-C Taking Active     Discontinued 06/25/19 1840             Patient Active Problem List   Diagnosis Date Noted   Migraine with aura and without status migrainosus, not intractable 06/14/2021   Peptic ulcer disease with hemorrhage 05/12/2021   Anemia due to acute blood loss 05/12/2021    Hypertension associated with diabetes ([place]) 05/02/2021   Chronic 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 05/02/2021   OAB (overactive bladder) 01/12/2021   History of total knee arthroplasty  05/09/2020   Morbid obesity ([place]) 08/28/2019   Stiffness of right knee 06/18/2018   Type 2 diabetes mellitus with stage 3a chronic kidney disease, without long-term current use of insulin ([place]) 10/17/2015   Carpal tunnel syndrome of left wrist 10/14/2015   Bipolar depression ([place]) 07/12/2015   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   Asthmatic bronchitis 08/02/2014   Hyperlipidemia associated with type 2 diabetes mellitus ([place]) 08/02/2014   History of suicidal ideation 08/02/2014   Avitaminosis D 08/02/2014   Generalized hyperhidrosis 08/02/2014   Airway hyperreactivity 08/02/2014   Anxiety disorder 08/02/2014   Insomnia 08/02/2014   Major depressive disorder, single episode 08/02/2014   Shortness of breath 08/05/2009    Immunization History  Administered Date(s) Administered   Influenza,inj,Quad PF,6+ Mos 01/06/2018, 12/12/2018, 05/02/2021   Pneumococcal Polysaccharide-23 07/30/2012   Tdap 07/28/2009    Conditions to be addressed/monitored:  Hyperlipidemia, Diabetes, Asthma, Overactive Bladder, and Bipolar Disorder, Chronic Pain, Chronic Migraines  Care Plan : General Pharmacy (Adult)  Updates made by Germaine Pomfret, RPH since 11/07/2021 12:00 AM     Problem: Hyperlipidemia, Diabetes, Asthma, Overactive Bladder, and Bipolar Disorder, Chronic Pain, Chronic Migraines   Priority: High     Long-Range Goal: Patient-Specific Goal   Start Date: 01/23/2021  Expected End Date: 08/31/2022  This Visit's Progress: On track  Recent Progress: On track  Priority: High  Note:   Current Barriers:  Unable to independently afford treatment regimen Unable to achieve control of diabetes   Pharmacist Clinical Goal(s):  Patient will verbalize ability to afford treatment  regimen achieve control of Diabetes as evidenced by A1c less than 8% through collaboration with PharmD and provider.   Interventions: 1:1 collaboration with [name], PA-C regarding development and update of comprehensive plan of care as evidenced by provider attestation and co-signature Inter-disciplinary care team collaboration (see longitudinal plan of care) Comprehensive medication review performed; medication list updated in electronic medical record  Hypertension (BP goal <130/80) -Controlled:  -Current treatment: Losartan 50 mg daily: Appropriate, Query effective  Propanolol ER 60 mg daily  -Medications previously tried: NA  -Current home readings (utilizes wrist monitor): patient unsure, reports it was "good"  -Denies hypotensive/hypertensive symptoms Continue current medications  Hyperlipidemia: (LDL goal < 70) -Uncontrolled: Not addressed during this visit. -Current treatment: Rosuvastatin 5 mg daily (AM) -Medications previously tried: NA  -Recommended to continue current medication  Diabetes (A1c goal <7%) -Controlled -Nephrology visit scheduled end of September  -Current medications: Glipizide XL 10 mg daily: Appropriate, Query effective  Trulicity 3 mg weekly: Appropriate, Query effective -Medications previously tried: Metformin (sluggish, stomach pain), finerenone (cost)   -Current home glucose readings fasting glucose: 115, 123, 120. Highest in recall was 147  -Denies hypoglycemia symptoms:   -Current meal patterns: Appetite significantly reduced. Struggles with sweet tooth. Endorses indulging in sweets once monthly. Pastries  -Current exercise: Limited by leg pain following knee replacement/femur fracture. Chair exercises 3 times weekly.  -Continue current medications   Asthma (Goal: control symptoms and prevent exacerbations) -Improved -Current treatment  Ventolin HFA 1-2 puffs every 4-6 hours as needed Symbicort 80-4.5 2 puffs twice daily   Montelukast 10 mg daily  -Medications previously tried: NA  -Exacerbations requiring treatment in last 6 months: None In the past four weeks, has the patient had:  Daytime asthma symptoms more than twice weekly?   No Any instance of night waking due to asthma?   No Utilized albuterol reliever for symptoms more than twice weekly? No: 2-3 x in past month  Any activity limitation due to asthma?    Yes -Patient reports consistent use of maintenance inhaler.   Bipolar Disorder (Goal: Achieve symptom remission) -Not ideally controlled: Not addressed during this visit. -Current treatment: None  -Medications previously tried/failed: Divalproex, Benztropine, Wellbutrin, Citalopram, Sertraline  -Counseled on importance of keeping upcoming psychiatry appointment   Migraines (Goal: Prevent migraines) -Controlled -Current treatment  Propanolol ER 60 mg daily  Rimegepant 75 mg daily as needed: Appropriate, Query effective  -Medications previously tried: Divalproex, Excedrin (GI Bleed) -Migraine control improved significantly since starting propranolol. Reports 3 migraine episodes in the past month. Her episodes are not as severe and do not last as long.   Chronic Pain (Goal: Minimize symptoms) -Controlled: Not addressed during this visit. -Managed by Carlynn Spry, PA-C (Ortho) -Current treatment  Baclofen 10 mg twice daily as needed  Boswellia extract 500 mg twice daily  Tramadol 50 mg q6h PRN  -Medications previously tried: NA -Patient also reports CBD gummy use.  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  -Requires rare use of tramadol   -Recommended to continue current medication  Overactive Bladder (Goal: Minimize symptoms) -Controlled -Current treatment  Oxybutynin XL 10 mg daily  -Medications previously tried: NA -Excellent symptomatic control.   -Recommended to continue current medication  Patient Goals/Self-Care Activities Patient  will:  - check glucose daily before breakfast, document, and provide at future appointments  Follow Up Plan: Telephone follow up appointment with care management team member scheduled for:  01/26/22 at 11:00 AM     Medication Assistance: None required.  Patient affirms current coverage meets needs. Patient receives Incruse, Musician through [employer].   Compliance/Adherence/Medication fill history: Care Gaps: Covid Vaccine  HIV Screening  Shingrix  Pneumonia  Foot Exam  Influenza   Star-Rating Drugs: Rosuvastatin 5 mg: LF 08/15/21 for 30-DS from [facility]  Patient's preferred pharmacy is:  [facility] - [place], [place] - [address] [place] [address] Phone: [phone] Fax: [phone]  [facility] [place], [place] [address] [facility] [address] [place] [address] Phone: [phone] Fax: [phone]  [id number]- [place] [address] [id number]: [phone]: [phone] Uses pill box? Yes Pt endorses 100% compliance  Patient decided to: Utilize [facility] for medication synchronization, packaging and delivery  Care Plan and Follow Up Patient Decision:  Patient agrees to Care Plan and Follow-up.  Plan: Telephone follow up appointment with care management team member scheduled for:  01/26/22 at 11:00 AM  AJunius Argyle PharmD, [phone]

## 2021-11-07 NOTE — Patient Instructions (Signed)
Visit Information It was great speaking with you today!  Please let me know if you have any questions about our visit.   Goals Addressed             This Visit's Progress    Monitor and Manage My Blood Sugar-Diabetes Type 2   On track    Timeframe:  Long-Range Goal Priority:  High Start Date: 01/23/2021                            Expected End Date: 01/24/2023                      Follow Up within 30 days   - check blood sugar before breakfast - check blood sugar if I feel it is too high or too low - enter blood sugar readings and medication or insulin into daily log - take the blood sugar log to all doctor visits    Why is this important?   Checking your blood sugar at home helps to keep it from getting very high or very low.  Writing the results in a diary or log helps the doctor know how to care for you.  Your blood sugar log should have the time, date and the results.  Also, write down the amount of insulin or other medicine that you take.  Other information, like what you ate, exercise done and how you were feeling, will also be helpful.     Notes:         Patient Care Plan: General Pharmacy (Adult)     Problem Identified: Hyperlipidemia, Diabetes, Asthma, Overactive Bladder, and Bipolar Disorder, Chronic Pain, Chronic Migraines   Priority: High     Long-Range Goal: Patient-Specific Goal   Start Date: 01/23/2021  Expected End Date: 08/31/2022  This Visit's Progress: On track  Recent Progress: On track  Priority: High  Note:   Current Barriers:  Unable to independently afford treatment regimen Unable to achieve control of diabetes   Pharmacist Clinical Goal(s):  Patient will verbalize ability to afford treatment regimen achieve control of Diabetes as evidenced by A1c less than 8% through collaboration with PharmD and provider.   Interventions: 1:1 collaboration with Alfredia Ferguson, PA-C regarding development and update of comprehensive plan of care as  evidenced by provider attestation and co-signature Inter-disciplinary care team collaboration (see longitudinal plan of care) Comprehensive medication review performed; medication list updated in electronic medical record  Hypertension (BP goal <130/80) -Controlled:  -Current treatment: Losartan 50 mg daily: Appropriate, Query effective  Propanolol ER 60 mg daily  -Medications previously tried: NA  -Current home readings (utilizes wrist monitor): patient unsure, reports it was "good"  -Denies hypotensive/hypertensive symptoms Continue current medications  Hyperlipidemia: (LDL goal < 70) -Uncontrolled: Not addressed during this visit. -Current treatment: Rosuvastatin 5 mg daily (AM) -Medications previously tried: NA  -Recommended to continue current medication  Diabetes (A1c goal <7%) -Controlled -Nephrology visit scheduled end of September  -Current medications: Glipizide XL 10 mg daily: Appropriate, Query effective  Trulicity 3 mg weekly: Appropriate, Query effective -Medications previously tried: Metformin (sluggish, stomach pain), finerenone (cost)   -Current home glucose readings fasting glucose: 115, 123, 120. Highest in recall was 147  -Denies hypoglycemia symptoms:   -Current meal patterns: Appetite significantly reduced. Struggles with sweet tooth. Endorses indulging in sweets once monthly. Pastries  -Current exercise: Limited by leg pain following knee replacement/femur fracture. Chair exercises 3 times weekly.  -  Continue current medications   Asthma (Goal: control symptoms and prevent exacerbations) -Improved -Current treatment  Ventolin HFA 1-2 puffs every 4-6 hours as needed Symbicort 80-4.5 2 puffs twice daily  Montelukast 10 mg daily  -Medications previously tried: NA  -Exacerbations requiring treatment in last 6 months: None In the past four weeks, has the patient had:  Daytime asthma symptoms more than twice weekly?   No Any instance of night waking due to  asthma?   No Utilized albuterol reliever for symptoms more than twice weekly? No: 2-3 x in past month  Any activity limitation due to asthma?    Yes -Patient reports consistent use of maintenance inhaler.   Bipolar Disorder (Goal: Achieve symptom remission) -Not ideally controlled: Not addressed during this visit. -Current treatment: None  -Medications previously tried/failed: Divalproex, Benztropine, Wellbutrin, Citalopram, Sertraline  -Counseled on importance of keeping upcoming psychiatry appointment   Migraines (Goal: Prevent migraines) -Controlled -Current treatment  Propanolol ER 60 mg daily  Rimegepant 75 mg daily as needed: Appropriate, Query effective  -Medications previously tried: Divalproex, Excedrin (GI Bleed) -Migraine control improved significantly since starting propranolol. Reports 3 migraine episodes in the past month. Her episodes are not as severe and do not last as long.   Chronic Pain (Goal: Minimize symptoms) -Controlled: Not addressed during this visit. -Managed by Altamese Cabal, PA-C (Ortho) -Current treatment  Baclofen 10 mg twice daily as needed  Boswellia extract 500 mg twice daily  Tramadol 50 mg q6h PRN  -Medications previously tried: NA -Patient also reports CBD gummy use.  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  -Requires rare use of tramadol   -Recommended to continue current medication  Overactive Bladder (Goal: Minimize symptoms) -Controlled -Current treatment  Oxybutynin XL 10 mg daily  -Medications previously tried: NA -Excellent symptomatic control.   -Recommended to continue current medication  Patient Goals/Self-Care Activities Patient will:  - check glucose daily before breakfast, document, and provide at future appointments  Follow Up Plan: Telephone follow up appointment with care management team member scheduled for:  01/26/22 at 11:00 AM    Patient agreed to services and verbal  consent obtained.   Patient verbalizes understanding of instructions and care plan provided today and agrees to view in MyChart. Active MyChart status and patient understanding of how to access instructions and care plan via MyChart confirmed with patient.     Angelena Sole, PharmD, [place]y [place], CPP  Clinical Pharmacist Practitioner  [facility] [phone]

## 2021-11-08 ENCOUNTER — Telehealth: Payer: Self-pay

## 2021-11-08 NOTE — Progress Notes (Signed)
Chronic Care Management Pharmacy Assistant   Name: Joanna Bishop  MRN: [id number] DOB: 1955/04/27  Reason for Encounter: Medication Review/Medication Coordination Call for [facility]   Recent office visits:  None ID  Recent consult visits:  None ID  Hospital visits:  None in previous 6 months  Medications: Outpatient Encounter Medications as of 11/08/2021  Medication Sig   acetaminophen (TYLENOL) 650 MG CR tablet Take 650 mg by mouth every 8 (eight) hours as needed for pain.   albuterol (VENTOLIN HFA) 108 (90 Base) MCG/ACT inhaler INHALE 1-2 PUFFS BY MOUTH into THE lungs every 4-6 hours AS NEEDED FOR WHEEZING AND/OR SHORTNESS OF BREATH   baclofen (LIORESAL) 10 MG tablet Take 1 tablet (10 mg total) by mouth 2 (two) times daily as needed for muscle spasms.   blood glucose meter kit and supplies Dispense based on patient and insurance preference. Use daily as directed. 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   budesonide-formoterol (SYMBICORT) 80-4.5 MCG/ACT inhaler Inhale 2 puffs into the lungs 2 (two) times daily.   Dulaglutide (TRULICITY) 3 KG/4.0NU SOPN Inject 3 mg as directed once a week.   glipiZIDE (GLUCOTROL XL) 10 MG 24 hr tablet TAKE ONE TABLET BY MOUTH EVERY MORNING   glucose blood (ONETOUCH VERIO) test strip Use to check blood sugars once daily as instructed   losartan (COZAAR) 50 MG tablet TAKE ONE TABLET BY MOUTH ONCE DAILY   montelukast (SINGULAIR) 10 MG tablet Take 1 tablet (10 mg total) by mouth daily.   Multiple Vitamin (MULTIVITAMIN WITH MINERALS) TABS tablet Take 1 tablet by mouth daily. Centrum Silver Women's 50+   NON FORMULARY Take 1 capsule by mouth in the morning and at bedtime. AZO Bladder Control Supplement   OneTouch Delica Lancets 27O MISC Use to check blood sugars once daily as directed.   oxybutynin (DITROPAN XL) 10 MG 24 hr tablet Take 1 tablet (10 mg total) by mouth at bedtime.   propranolol ER (INDERAL LA) 60 MG 24 hr capsule Take 1 capsule (60 mg total) by  mouth daily.   Rimegepant Sulfate (NURTEC) 75 MG TBDP Take 1 mg by mouth daily as needed. At onset of migraine/aura   rosuvastatin (CRESTOR) 5 MG tablet Take 1 tablet (5 mg total) by mouth daily.   traMADol (ULTRAM) 50 MG tablet Take 1 tablet (50 mg total) by mouth every 6 (six) hours as needed.   [DISCONTINUED] XARELTO 10 MG TABS tablet Take 10 mg by mouth every morning.   No facility-administered encounter medications on file as of 11/08/2021.   Care Gaps: COVID-19 Vaccine HIV Screening Fecal DNA Diabetic Foot Exam Diabetic Eye Exam PAP SMEAR Influenza Vaccine  Star Rating Drugs: Losartan 50 mg last filled on 10/16/2021 for a 30-Day Supply with [facility] Glipizide 10 mg last filled on 10/16/2021 for a 30-Day Supply with [facility] Rosuvastatin 5 mg last filled on 10/16/2021 for a 30-Day supply with [facility] Trulicity 3 mg patient receives this medication from the [place] so not sure when the last fill date was  BP Readings from Last 3 Encounters:  09/06/21 132/86  06/14/21 120/70  05/12/21 138/83    Lab Results  Component Value Date   HGBA1C 6.0 (A) 09/06/2021    Patient obtains medications through Vials  30 Days   Last adherence delivery included: Losartan 50 mg 1 tablet daily (Breakfast) Albuterol 108 Inhaler inhale 1-2 puffs every every 4-6 hours prn for wheezing or shortness of breath Glipizide 10 mg 1 tablet daily (Breakfast) Montelukast 10  mg 1 tablet daily (Bedtime) Rosuvastatin 5 mg 1 tablet daily (Breakfast) Oxybutynin 10 mg 1 tablet daily (Bedtime) Propanolol ER 60 mg daily  OneTouch Test Strips OneTouch Lancets  Patient declined medications for August: Nurtec 75 mg 1 tablet as needed at onset of migraine-Patient receives this medication through [facility] Trulicity Inject 1.5 mg into the skin once a week- Patient is getting this medication through [facility] Symbicort 80-4.5 mcg Inhaler Inhale 2 puffs twice daily-Patient will be getting this  medication from [facility]  Patient is due for next adherence delivery on: 11/16/2021 (Thursday) 2nd Route.  Called patient and reviewed medications and coordinated delivery.  This delivery to include: Losartan 50 mg 1 tablet daily (Breakfast) Albuterol 108 Inhaler inhale 1-2 puffs every every 4-6 hours prn for wheezing or shortness of breath Glipizide 10 mg 1 tablet daily (Breakfast) Montelukast 10 mg 1 tablet daily (Bedtime) Rosuvastatin 5 mg 1 tablet daily (Breakfast) Oxybutynin 10 mg 1 tablet daily (Bedtime) Propanolol ER 60 mg daily  OneTouch Test Strips OneTouch Lancets  Patient declined the following medications for the month of August:  Patient needs refills for: No refills needed for this months delivery.  Confirmed delivery date of 11/16/2021 2nd Route, advised patient that pharmacy will contact them the morning of delivery.  Patient has a telephone follow-up appointment with Junius Argyle, CPP on 01/26/2022 @ 1100.  Lynann Bologna, CPA/CMA Clinical Pharmacist Assistant Phone: [phone]

## 2021-11-09 ENCOUNTER — Ambulatory Visit: Payer: Medicare HMO | Admitting: Physician Assistant

## 2021-11-09 NOTE — Progress Notes (Deleted)
I,Sha'taria Shavelle Runkel,acting as a Education administrator for [employer], PA-C.,have documented all relevant documentation on the behalf of [name], PA-C,as directed by  [name], PA-C while in the presence of [name], PA-C.   Established patient visit   Patient: Joanna Bishop   DOB: 03-11-1955   66 y.o. Female  MRN: [id number] Visit Date: 11/09/2021  Today's healthcare provider: [name], PA-C   No chief complaint on file.  Subjective    HPI  Follow up for migraines  The patient was last seen for this 9 weeks ago. Changes made at last visit include can try propranolol 60 mg daily for prevention and f/u with neuro.  She reports {excellent/good/fair/poor:19665} compliance with treatment. Neuro appt scheduled for 12/07/21 She feels that condition is {improved/worse/unchanged:3041574}. She {is/is not:[id number]} having side effects. ***  -----------------------------------------------------------------------------------------   Medications: Outpatient Medications Prior to Visit  Medication Sig   acetaminophen (TYLENOL) 650 MG CR tablet Take 650 mg by mouth every 8 (eight) hours as needed for pain.   albuterol (VENTOLIN HFA) 108 (90 Base) MCG/ACT inhaler INHALE 1-2 PUFFS BY MOUTH into THE lungs every 4-6 hours AS NEEDED FOR WHEEZING AND/OR SHORTNESS OF BREATH   baclofen (LIORESAL) 10 MG tablet Take 1 tablet (10 mg total) by mouth 2 (two) times daily as needed for muscle spasms.   blood glucose meter kit and supplies Dispense based on patient and insurance preference. Use daily as directed. 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   budesonide-formoterol (SYMBICORT) 80-4.5 MCG/ACT inhaler Inhale 2 puffs into the lungs 2 (two) times daily.   Dulaglutide (TRULICITY) 3 OF/7.5ZW SOPN Inject 3 mg as directed once a week.   glipiZIDE (GLUCOTROL XL) 10 MG 24 hr tablet TAKE ONE TABLET BY MOUTH EVERY MORNING   glucose blood (ONETOUCH VERIO) test strip Use to check blood sugars once daily as instructed    losartan (COZAAR) 50 MG tablet TAKE ONE TABLET BY MOUTH ONCE DAILY   montelukast (SINGULAIR) 10 MG tablet Take 1 tablet (10 mg total) by mouth daily.   Multiple Vitamin (MULTIVITAMIN WITH MINERALS) TABS tablet Take 1 tablet by mouth daily. Centrum Silver Women's 50+   NON FORMULARY Take 1 capsule by mouth in the morning and at bedtime. AZO Bladder Control Supplement   OneTouch Delica Lancets 25E MISC Use to check blood sugars once daily as directed.   oxybutynin (DITROPAN XL) 10 MG 24 hr tablet Take 1 tablet (10 mg total) by mouth at bedtime.   propranolol ER (INDERAL LA) 60 MG 24 hr capsule Take 1 capsule (60 mg total) by mouth daily.   Rimegepant Sulfate (NURTEC) 75 MG TBDP Take 1 mg by mouth daily as needed. At onset of migraine/aura   rosuvastatin (CRESTOR) 5 MG tablet Take 1 tablet (5 mg total) by mouth daily.   traMADol (ULTRAM) 50 MG tablet Take 1 tablet (50 mg total) by mouth every 6 (six) hours as needed.   No facility-administered medications prior to visit.    Review of Systems  {Labs  Heme  Chem  Endocrine  Serology  Results Review (optional):23779}   Objective    There were no vitals taken for this visit. {Show previous vital signs (optional):23777}  Physical Exam  ***  No results found for any visits on 11/09/21.  Assessment & Plan     ***  No follow-ups on file.      {provider attestation***:1}   [name], PA-C  [facility] [phone] (phone) [phone] (fax)  [place]

## 2021-11-25 DIAGNOSIS — J45909 Unspecified asthma, uncomplicated: Secondary | ICD-10-CM

## 2021-11-25 DIAGNOSIS — I10 Essential (primary) hypertension: Secondary | ICD-10-CM

## 2021-11-25 DIAGNOSIS — Z7985 Long-term (current) use of injectable non-insulin antidiabetic drugs: Secondary | ICD-10-CM

## 2021-11-25 DIAGNOSIS — Z7984 Long term (current) use of oral hypoglycemic drugs: Secondary | ICD-10-CM

## 2021-11-25 DIAGNOSIS — E785 Hyperlipidemia, unspecified: Secondary | ICD-10-CM | POA: Diagnosis not present

## 2021-11-25 DIAGNOSIS — E1159 Type 2 diabetes mellitus with other circulatory complications: Secondary | ICD-10-CM | POA: Diagnosis not present

## 2021-11-25 DIAGNOSIS — F319 Bipolar disorder, unspecified: Secondary | ICD-10-CM

## 2021-11-25 DIAGNOSIS — R69 Illness, unspecified: Secondary | ICD-10-CM | POA: Diagnosis not present

## 2021-12-06 ENCOUNTER — Telehealth: Payer: Self-pay

## 2021-12-06 NOTE — Progress Notes (Signed)
Chronic Care Management Pharmacy Assistant   Name: Joanna Bishop  MRN: [id number] DOB: [birth year]  Reason for Encounter: Medication Review/Medication Coordination for [facility]   Recent office visits:  None ID  Recent consult visits:  None ID  Hospital visits:  None in previous 6 months  Medications: Outpatient Encounter Medications as of 12/06/2021  Medication Sig   acetaminophen (TYLENOL) 650 MG CR tablet Take 650 mg by mouth every 8 (eight) hours as needed for pain.   albuterol (VENTOLIN HFA) 108 (90 Base) MCG/ACT inhaler INHALE 1-2 PUFFS BY MOUTH into THE lungs every 4-6 hours AS NEEDED FOR WHEEZING AND/OR SHORTNESS OF BREATH   baclofen (LIORESAL) 10 MG tablet Take 1 tablet (10 mg total) by mouth 2 (two) times daily as needed for muscle spasms.   blood glucose meter kit and supplies Dispense based on patient and insurance preference. Use daily as directed. 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   budesonide-formoterol (SYMBICORT) 80-4.5 MCG/ACT inhaler Inhale 2 puffs into the lungs 2 (two) times daily.   Dulaglutide (TRULICITY) 3 EV/0.3JK SOPN Inject 3 mg as directed once a week.   glipiZIDE (GLUCOTROL XL) 10 MG 24 hr tablet TAKE ONE TABLET BY MOUTH EVERY MORNING   glucose blood (ONETOUCH VERIO) test strip Use to check blood sugars once daily as instructed   losartan (COZAAR) 50 MG tablet TAKE ONE TABLET BY MOUTH ONCE DAILY   montelukast (SINGULAIR) 10 MG tablet Take 1 tablet (10 mg total) by mouth daily.   Multiple Vitamin (MULTIVITAMIN WITH MINERALS) TABS tablet Take 1 tablet by mouth daily. Centrum Silver Women's 50+   NON FORMULARY Take 1 capsule by mouth in the morning and at bedtime. AZO Bladder Control Supplement   OneTouch Delica Lancets 09F MISC Use to check blood sugars once daily as directed.   oxybutynin (DITROPAN XL) 10 MG 24 hr tablet Take 1 tablet (10 mg total) by mouth at bedtime.   propranolol ER (INDERAL LA) 60 MG 24 hr capsule Take 1 capsule (60 mg total) by mouth  daily.   Rimegepant Sulfate (NURTEC) 75 MG TBDP Take 1 mg by mouth daily as needed. At onset of migraine/aura   rosuvastatin (CRESTOR) 5 MG tablet Take 1 tablet (5 mg total) by mouth daily.   traMADol (ULTRAM) 50 MG tablet Take 1 tablet (50 mg total) by mouth every 6 (six) hours as needed.   [DISCONTINUED] XARELTO 10 MG TABS tablet Take 10 mg by mouth every morning.   No facility-administered encounter medications on file as of 12/06/2021.   Care Gaps: COVID-19 Vaccine HIV Screening Fecal DNA Diabetic Foot Exam Diabetic Eye Exam PAP SMEAR Influenza Vaccine  Star Rating Drugs: Losartan 50 mg last filled on 11/15/2021 for a 30-Day Supply with [facility] Glipizide 10 mg last filled on 11/15/2021 for a 30-Day Supply with [facility] Rosuvastatin 5 mg last filled on 11/15/2021 for a 30-Day supply with [facility] Trulicity 3 mg patient receives this medication from the [place] so not sure when the last fill date was  BP Readings from Last 3 Encounters:  09/06/21 132/86  06/14/21 120/70  05/12/21 138/83    Lab Results  Component Value Date   HGBA1C 6.0 (A) 09/06/2021    Patient obtains medications through Vials  30 Days   Last adherence delivery included:  Losartan 50 mg 1 tablet daily (Breakfast) Albuterol 108 Inhaler inhale 1-2 puffs every every 4-6 hours prn for wheezing or shortness of breath Glipizide 10 mg 1 tablet daily (Breakfast) Montelukast 10  mg 1 tablet daily (Bedtime) Rosuvastatin 5 mg 1 tablet daily (Breakfast) Oxybutynin 10 mg 1 tablet daily (Bedtime) Propanolol ER 60 mg daily  OneTouch Test Strips OneTouch Lancets  Patient declined medications for September: Nurtec 75 mg 1 tablet as needed at onset of migraine-Patient receives this medication through [employer] Trulicity Inject 1.5 mg into the skin once a week- Patient is getting this medication through [facility] Symbicort 80-4.5 mcg Inhaler Inhale 2 puffs twice daily-Patient will be getting this  medication from [facility]  Patient is due for next adherence delivery on: 12/18/2021 (Monday) 1st Route  Called patient and reviewed medications and coordinated delivery.  This delivery to include: Losartan 50 mg 1 tablet daily (Breakfast) Albuterol 108 Inhaler inhale 1-2 puffs every every 4-6 hours prn for wheezing or shortness of breath Glipizide 10 mg 1 tablet daily (Breakfast) Montelukast 10 mg 1 tablet daily (Bedtime) Rosuvastatin 5 mg 1 tablet daily (Breakfast) Oxybutynin 10 mg 1 tablet daily (Bedtime) Propanolol ER 60 mg daily   Patient declined the following medications for the month of October: Nurtec 75 mg 1 tablet as needed at onset of migraine-Patient receives this medication through [employer] Trulicity Inject 1.5 mg into the skin once a week- Patient is getting this medication through [facility] Symbicort 80-4.5 mcg Inhaler Inhale 2 puffs twice daily-Patient will be getting this medication from [facility]  Patient needs refills for: No refills needed for this month's delivery  Confirmed delivery date of 12/18/2021 1st Route, advised patient that pharmacy will contact them the morning of delivery.  The patient has a telephone follow-up appointment with Junius Argyle, CPP on 01/26/2022 @ 1100.  Lynann Bologna, CPA/CMA Clinical Pharmacist Assistant Phone: [phone]

## 2022-01-02 ENCOUNTER — Other Ambulatory Visit: Payer: Self-pay | Admitting: Physician Assistant

## 2022-01-02 DIAGNOSIS — J301 Allergic rhinitis due to pollen: Secondary | ICD-10-CM

## 2022-01-02 NOTE — Telephone Encounter (Signed)
Requested Prescriptions  Pending Prescriptions Disp Refills   rosuvastatin (CRESTOR) 5 MG tablet [Pharmacy Med Name: rosuvastatin 5 mg tablet] 90 tablet 1    Sig: TAKE ONE TABLET BY MOUTH ONCE DAILY     Cardiovascular:  Antilipid - Statins 2 Failed - 01/02/2022  8:03 AM      Failed - Cr in normal range and within 360 days    Creatinine, Ser  Date Value Ref Range Status  09/06/2021 1.17 (H) 0.57 - 1.00 mg/dL Final         Failed - Lipid Panel in normal range within the last 12 months    Cholesterol, Total  Date Value Ref Range Status  09/06/2021 129 100 - 199 mg/dL Final   LDL Chol Calc (NIH)  Date Value Ref Range Status  09/06/2021 49 0 - 99 mg/dL Final   HDL  Date Value Ref Range Status  09/06/2021 36 (L) >39 mg/dL Final   Triglycerides  Date Value Ref Range Status  09/06/2021 288 (H) 0 - 149 mg/dL Final         Passed - Patient is not pregnant      Passed - Valid encounter within last 12 months    Recent Outpatient Visits           3 months ago Hypertension associated with diabetes [facility])   [facility] [name], PA-C   6 months ago Hypertension associated with diabetes [facility])   [facility] [name], [place], PA-C   7 months ago Peptic ulcer disease with hemorrhage   [facility] [name], [place], PA-C   8 months ago Nausea, vomiting and diarrhea   [employer], Erin E, PA-C   8 months ago Type 2 diabetes mellitus with diabetic polyneuropathy, without long-term current use of insulin ([place])   [employer], [name], PA-C               montelukast (SINGULAIR) 10 MG tablet [Pharmacy Med Name: montelukast 10 mg tablet] 90 tablet 1    Sig: TAKE ONE TABLET BY MOUTH EVERYDAY AT BEDTIME     Pulmonology:  Leukotriene Inhibitors Passed - 01/02/2022  8:03 AM      Passed - Valid encounter within last 12 months    Recent Outpatient Visits           3 months ago Hypertension  associated with diabetes [facility])   [facility] [name], [place], PA-C   6 months ago Hypertension associated with diabetes ([place])   [facility] [name], [place], PA-C   7 months ago Peptic ulcer disease with hemorrhage   [facility] [name], [place], PA-C   8 months ago Nausea, vomiting and diarrhea   [employer], Erin E, PA-C   8 months ago Type 2 diabetes mellitus with diabetic polyneuropathy, without long-term current use of insulin ([place])   [facility], [name], PA-C               oxybutynin (DITROPAN-XL) 10 MG 24 hr tablet [Pharmacy Med Name: oxybutynin chloride ER 10 mg tablet,extended release 24 hr] 90 tablet 1    Sig: TAKE ONE TABLET BY MOUTH EVERYDAY AT BEDTIME     Urology:  Bladder Agents Passed - 01/02/2022  8:03 AM      Passed - Valid encounter within last 12 months    Recent Outpatient Visits           3 months ago Hypertension associated with diabetes ([place])  [facility] [place], [place], PA-C   6 months ago Hypertension associated with diabetes [facility])   [facility] Alfredia Ferguson, PA-C   7 months ago Peptic ulcer disease with hemorrhage   [facility] Alfredia Ferguson, PA-C   8 months ago Nausea, vomiting and diarrhea   [employer], Oswaldo [place], PA-C   8 months ago Type 2 diabetes mellitus with diabetic polyneuropathy, without long-term current use of insulin [facility])   [facility] Alfredia Ferguson, [place]

## 2022-01-03 ENCOUNTER — Telehealth: Payer: Self-pay

## 2022-01-03 NOTE — Progress Notes (Signed)
Chronic Care Management Pharmacy Assistant   Name: Joanna Bishop  MRN: [id number] DOB: [birth year]  Reason for Encounter: Medication Review/Medication Coordination for [facility]   Recent office visits:  None ID  Recent consult visits:  None ID  Hospital visits:  None in previous 6 months  Medications: Outpatient Encounter Medications as of 01/03/2022  Medication Sig   acetaminophen (TYLENOL) 650 MG CR tablet Take 650 mg by mouth every 8 (eight) hours as needed for pain.   albuterol (VENTOLIN HFA) 108 (90 Base) MCG/ACT inhaler INHALE 1-2 PUFFS BY MOUTH into THE lungs every 4-6 hours AS NEEDED FOR WHEEZING AND/OR SHORTNESS OF BREATH   baclofen (LIORESAL) 10 MG tablet Take 1 tablet (10 mg total) by mouth 2 (two) times daily as needed for muscle spasms.   blood glucose meter kit and supplies Dispense based on patient and insurance preference. Use daily as directed. 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   budesonide-formoterol (SYMBICORT) 80-4.5 MCG/ACT inhaler Inhale 2 puffs into the lungs 2 (two) times daily.   Dulaglutide (TRULICITY) 3 LO/7.5IE SOPN Inject 3 mg as directed once a week.   glipiZIDE (GLUCOTROL XL) 10 MG 24 hr tablet TAKE ONE TABLET BY MOUTH EVERY MORNING   glucose blood (ONETOUCH VERIO) test strip Use to check blood sugars once daily as instructed   losartan (COZAAR) 50 MG tablet TAKE ONE TABLET BY MOUTH ONCE DAILY   montelukast (SINGULAIR) 10 MG tablet TAKE ONE TABLET BY MOUTH EVERYDAY AT BEDTIME   Multiple Vitamin (MULTIVITAMIN WITH MINERALS) TABS tablet Take 1 tablet by mouth daily. Centrum Silver Women's 50+   NON FORMULARY Take 1 capsule by mouth in the morning and at bedtime. AZO Bladder Control Supplement   OneTouch Delica Lancets 33I MISC Use to check blood sugars once daily as directed.   oxybutynin (DITROPAN-XL) 10 MG 24 hr tablet TAKE ONE TABLET BY MOUTH EVERYDAY AT BEDTIME   propranolol ER (INDERAL LA) 60 MG 24 hr capsule Take 1 capsule (60 mg total) by mouth daily.    Rimegepant Sulfate (NURTEC) 75 MG TBDP Take 1 mg by mouth daily as needed. At onset of migraine/aura   rosuvastatin (CRESTOR) 5 MG tablet TAKE ONE TABLET BY MOUTH ONCE DAILY   traMADol (ULTRAM) 50 MG tablet Take 1 tablet (50 mg total) by mouth every 6 (six) hours as needed.   [DISCONTINUED] XARELTO 10 MG TABS tablet Take 10 mg by mouth every morning.   No facility-administered encounter medications on file as of 01/03/2022.   Care Gaps: COVID-19 Vaccine HIV Screening Fecal DNA Diabetic Foot Exam Diabetic Eye Exam PAP SMEAR Influenza Vaccine Zoster Vaccine  Star Rating Drugs: Losartan 50 mg last filled on 12/13/2021 for a 30-Day Supply with [facility] Glipizide 10 mg last filled on 12/13/2021 for a 30-Day Supply with [facility] Rosuvastatin 5 mg last filled on 12/13/2021 for a 30-Day supply with [facility] Trulicity 3 mg patient receives this medication from the [place] so not sure when the last fill date was  BP Readings from Last 3 Encounters:  09/06/21 132/86  06/14/21 120/70  05/12/21 138/83    Lab Results  Component Value Date   HGBA1C 6.0 (A) 09/06/2021    Patient obtains medications through Vials  30 Days   Last adherence delivery included:  Losartan 50 mg 1 tablet daily (Breakfast) Albuterol 108 Inhaler inhale 1-2 puffs every every 4-6 hours prn for wheezing or shortness of breath Glipizide 10 mg 1 tablet daily (Breakfast) Montelukast 10 mg 1 tablet daily (  Bedtime) Rosuvastatin 5 mg 1 tablet daily (Breakfast) Oxybutynin 10 mg 1 tablet daily (Bedtime) Propanolol ER 60 mg daily   Patient declined the following medications for the month of October: Nurtec 75 mg 1 tablet as needed at onset of migraine-Patient receives this medication through [employer] Trulicity Inject 1.5 mg into the skin once a week- Patient is getting this medication through [facility] Symbicort 80-4.5 mcg Inhaler Inhale 2 puffs twice daily-Patient will be getting this medication from  [facility]  Patient is due for next adherence delivery on: 01/16/2022 1st Route.  Called patient and reviewed medications and coordinated delivery.  This delivery to include: Losartan 50 mg 1 tablet daily (Breakfast) Albuterol 108 Inhaler inhale 1-2 puffs every every 4-6 hours prn for wheezing or shortness of breath Glipizide 10 mg 1 tablet daily (Breakfast) Montelukast 10 mg 1 tablet daily (Bedtime) Rosuvastatin 5 mg 1 tablet daily (Breakfast) Oxybutynin 10 mg 1 tablet daily (Bedtime) Propanolol ER 60 mg daily  OneTouch Strips OneTouch Lancets  Patient declined the following medications for the month of November: Nurtec 75 mg 1 tablet as needed at onset of migraine-Patient receives this medication through [employer] Trulicity Inject 1.5 mg into the skin once a week- Patient is getting this medication through [facility] Symbicort 80-4.5 mcg Inhaler Inhale 2 puffs twice daily-Patient will be getting this medication from [facility]  Patient needs refills for the month of November: No refills needed  Confirmed delivery date of 01/16/2022 1st Route, advised patient that pharmacy will contact them the morning of delivery.   Patient has an upcoming visit with Junius Argyle, CPP on 01/26/2022 @ 1100.  Lynann Bologna, CPA/CMA Clinical Pharmacist Assistant Phone: [phone]

## 2022-01-26 ENCOUNTER — Ambulatory Visit (INDEPENDENT_AMBULATORY_CARE_PROVIDER_SITE_OTHER): Payer: Medicare HMO

## 2022-01-26 DIAGNOSIS — E1159 Type 2 diabetes mellitus with other circulatory complications: Secondary | ICD-10-CM

## 2022-01-26 DIAGNOSIS — E1142 Type 2 diabetes mellitus with diabetic polyneuropathy: Secondary | ICD-10-CM

## 2022-01-26 MED ORDER — LOSARTAN POTASSIUM 100 MG PO TABS: 100.0000 mg | ORAL_TABLET | Freq: Every day | ORAL | 1 refills | Status: DC

## 2022-01-26 MED ORDER — TRULICITY 3 MG/0.5ML ~~LOC~~ SOAJ: 3.0000 mg | SUBCUTANEOUS | 3 refills | Status: DC

## 2022-01-26 NOTE — Patient Instructions (Signed)
Visit Information It was great speaking with you today!  Please let me know if you have any questions about our visit.   Goals Addressed             This Visit's Progress    Monitor and Manage My Blood Sugar-Diabetes Type 2   On track    Timeframe:  Long-Range Goal Priority:  High Start Date: 01/23/2021                            Expected End Date: 01/24/2023                      Follow Up within 30 days   - check blood sugar before breakfast - check blood sugar if I feel it is too high or too low - enter blood sugar readings and medication or insulin into daily log - take the blood sugar log to all doctor visits    Why is this important?   Checking your blood sugar at home helps to keep it from getting very high or very low.  Writing the results in a diary or log helps the doctor know how to care for you.  Your blood sugar log should have the time, date and the results.  Also, write down the amount of insulin or other medicine that you take.  Other information, like what you ate, exercise done and how you were feeling, will also be helpful.     Notes:         Patient Care Plan: General Pharmacy (Adult)     Problem Identified: Hyperlipidemia, Diabetes, Asthma, Overactive Bladder, and Bipolar Disorder, Chronic Pain, Chronic Migraines   Priority: High     Long-Range Goal: Patient-Specific Goal   Start Date: 01/23/2021  Expected End Date: 08/31/2022  This Visit's Progress: On track  Recent Progress: On track  Priority: High  Note:   Current Barriers:  Unable to independently afford treatment regimen Unable to achieve control of diabetes   Pharmacist Clinical Goal(s):  Patient will verbalize ability to afford treatment regimen achieve control of Diabetes as evidenced by A1c less than 8% through collaboration with PharmD and provider.   Interventions: 1:1 collaboration with Alfredia Ferguson, PA-C regarding development and update of comprehensive plan of care as  evidenced by provider attestation and co-signature Inter-disciplinary care team collaboration (see longitudinal plan of care) Comprehensive medication review performed; medication list updated in electronic medical record  Hypertension (BP goal <130/80) -Uncontrolled:  -Current treatment: Losartan 50 mg daily: Appropriate, Query effective  Propanolol ER 60 mg daily  -Medications previously tried:  -Current home readings: 131/83, 135/93, 130/96, 126/81, 171/86 (Thanksgiving)   -Denies hypotensive/hypertensive symptoms -Patient blood pressure is mildly elevated. Discussed benefits of maximally optimized ARB for renal protection. Patient was amenable to increasing her losartan.   Increase losartan to 100 mg daily.   Hyperlipidemia: (LDL goal < 70) -Controlled: Not addressed during this visit. -Current treatment: Rosuvastatin 5 mg daily (AM) -Medications previously tried: NA  -Recommended to continue current medication  Diabetes (A1c goal <7%) -Controlled -Nephrology visit scheduled end of September  -Current medications: Glipizide XL 10 mg daily: Appropriate, Query effective  Trulicity 3 mg weekly: Appropriate, Query effective -Medications previously tried: Metformin (sluggish, stomach pain), finerenone (cost)   -Current home glucose readings fasting glucose: 120-127   -Denies hypoglycemia symptoms:   -Current meal patterns: Appetite significantly reduced. Struggles with sweet tooth. Endorses indulging in sweets once  monthly. Pastries  -Current exercise: Limited by leg pain following knee replacement/femur fracture. Chair exercises 3 times weekly.  -Continue current medications   Asthma (Goal: control symptoms and prevent exacerbations) -Improved -Current treatment  Ventolin HFA 1-2 puffs every 4-6 hours as needed Symbicort 80-4.5 2 puffs twice daily  Montelukast 10 mg daily  -Medications previously tried: NA  -Exacerbations requiring treatment in last 6 months: None In the  past four weeks, has the patient had:  Daytime asthma symptoms more than twice weekly?   No Any instance of night waking due to asthma?   No Utilized albuterol reliever for symptoms more than twice weekly? No: 2-3 x in past month  Any activity limitation due to asthma?    Yes -Patient reports consistent use of maintenance inhaler.   Bipolar Disorder (Goal: Achieve symptom remission) -Not ideally controlled: Not addressed during this visit. -Current treatment: None  -Medications previously tried/failed: Divalproex, Benztropine, Wellbutrin, Citalopram, Sertraline  -Counseled on importance of keeping upcoming psychiatry appointment   Migraines (Goal: Prevent migraines) -Controlled -Current treatment  Propanolol ER 60 mg daily  Rimegepant 75 mg daily as needed: Appropriate, Query effective  -Medications previously tried: Divalproex, Excedrin (GI Bleed) -Migraine control improved significantly since starting propranolol. Reports 3 migraine episodes in the past month. Her episodes are not as severe and do not last as long.   Chronic Pain (Goal: Minimize symptoms) -Controlled: Not addressed during this visit. -Managed by Altamese Cabal, PA-C (Ortho) -Current treatment  Baclofen 10 mg twice daily as needed  Boswellia extract 500 mg twice daily  Tramadol 50 mg q6h PRN  -Medications previously tried: NA -Patient also reports CBD gummy use.  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  -Requires rare use of tramadol   -Recommended to continue current medication  Overactive Bladder (Goal: Minimize symptoms) -Controlled -Current treatment  Oxybutynin XL 10 mg daily  -Medications previously tried: NA -Excellent symptomatic control.   -Recommended to continue current medication  Patient Goals/Self-Care Activities Patient will:  - check glucose daily before breakfast, document, and provide at future appointments  Follow Up Plan: Telephone follow up  appointment with care management team member scheduled for:  04/27/2022 at 11:00 AM    Patient agreed to services and verbal consent obtained.   Patient verbalizes understanding of instructions and care plan provided today and agrees to view in MyChart. Active MyChart status and patient understanding of how to access instructions and care plan via MyChart confirmed with patient.     Angelena Sole, PharmD, Patsy [place], CPP  Clinical Pharmacist Practitioner  [facility] [phone]

## 2022-01-26 NOTE — Progress Notes (Signed)
Chronic Care Management Pharmacy Note  01/26/2022 Name:  Joanna Bishop MRN:  [id number] DOB:  [birth year]  Summary: Patient presents for CCM follow-up.   -Patient blood pressure is mildly elevated. Discussed benefits of maximally optimized ARB for renal protection. Patient was amenable to increasing her losartan.    -Blood sugars well controlled.   Recommendations/Changes made from today's visit: INCREASE Losartan to 100 mg daily   Plan: CPP follow-up 3 months    Subjective: Joanna Bishop is an 66 y.o. year old female who is a primary patient of [name], [name], [place].  The CCM team was consulted for assistance with disease management and care coordination needs.    Engaged with patient by telephone for follow up visit in response to provider referral for pharmacy case management and/or care coordination services.   Consent to Services:  The patient was given information about Chronic Care Management services, agreed to services, and gave verbal consent prior to initiation of services.  Please see initial visit note for detailed documentation.   Patient Care Team: [name], PA-C as PCP - General (Physician Assistant) Ocie Doyne, [place] (Optometry) Germaine Pomfret, [facility] (Pharmacist)  Recent office visits: 09/06/21: Patient presented to Juleen China, PA-C for follow-up. A1c 6.0%. LDL 49. Microalbuminuria  05/12/21: Patient presented to [name], PA-C for follow-up. Aspirin stopped.   05/02/21: Patient presented to [name], PA-C for follow-up. Trulicity 3 mg weekly, losartan 50 mg daily started.   Recent consult visits: None noted.  Hospital visits: 3/10-3/13/23: Patient hospitalized for GI bleed.  03/09/21: Patient presented to ED due to abdominal pain.   Objective:  Lab Results  Component Value Date   CREATININE 1.17 (H) 09/06/2021   BUN 23 09/06/2021   GFRNONAA 60 (L) 03/09/2021   GFRAA >60 06/25/2019   NA 137 09/06/2021   K 4.5 09/06/2021    CALCIUM 9.5 09/06/2021   CO2 19 (L) 09/06/2021   GLUCOSE 159 (H) 09/06/2021    Lab Results  Component Value Date/Time   HGBA1C 6.0 (A) 09/06/2021 09:45 AM   HGBA1C 13.8 (A) 01/12/2021 01:33 PM   HGBA1C 8.8 (H) 03/12/2018 09:32 AM   HGBA1C 6.4 (H) 12/20/2017 09:24 AM    Last diabetic Eye exam:  Lab Results  Component Value Date/Time   HMDIABEYEEXA No Retinopathy 04/04/2020 12:00 AM    Last diabetic Foot exam: No results found for: "HMDIABFOOTEX"   Lab Results  Component Value Date   CHOL 129 09/06/2021   HDL 36 (L) 09/06/2021   LDLCALC 49 09/06/2021   TRIG 288 (H) 09/06/2021   CHOLHDL 3.6 09/06/2021       Latest Ref Rng & Units 09/06/2021   10:13 AM 03/09/2021    6:49 PM 01/26/2021   10:18 AM  Hepatic Function  Total Protein 6.0 - 8.5 g/dL 6.7  7.4  6.5   Albumin 3.9 - 4.9 g/dL 4.3  4.5  4.5   AST 0 - 40 IU/L _0 ALT 0 - 32 IU/L 25  33  38   Alk Phosphatase 44 - 121 IU/L 84  90  141   Total Bilirubin 0.0 - 1.2 mg/dL 0.7  1.2  0.7     Lab Results  Component Value Date/Time   TSH 2.510 10/14/2015 11:41 AM   TSH 3.030 07/12/2015 07:23 PM   TSH 1.730 09/13/2014 10:26 AM       Latest Ref Rng & Units 09/06/2021   10:13 AM 05/02/2021  2:33 PM 03/09/2021    6:49 PM  CBC  WBC 3.4 - 10.8 x10E3/uL 11.0  11.8  11.4   Hemoglobin 11.1 - 15.9 g/dL 13.1  13.8  13.5   Hematocrit 34.0 - 46.6 % 39.6  41.1  39.2   Platelets 150 - 450 x10E3/uL 243  216  257     Lab Results  Component Value Date/Time   VD25OH 29.0 (L) 10/14/2015 11:41 AM    Clinical ASCVD: No  The ASCVD Risk score (Arnett DK, et al., 2019) failed to calculate for the following reasons:   The valid total cholesterol range is 130 to 320 mg/dL       09/06/2021    9:04 AM 05/02/2021    1:28 PM 03/08/2021    9:05 AM  Depression screen PHQ 2/9  Decreased Interest 1 3 0  Down, Depressed, Hopeless 1 3 0  PHQ - 2 Score 2 6 0  Altered sleeping 1 3 0  Tired, decreased energy 1 3 0  Change in appetite 1  3 0  Feeling bad or failure about yourself  1 3 0  Trouble concentrating 1 3 0  Moving slowly or fidgety/restless 0 3 0  Suicidal thoughts 0 1 0  PHQ-9 Score 7 25 0  Difficult doing work/chores Somewhat difficult Extremely dIfficult Not difficult at all    Social History   Tobacco Use  Smoking Status Never  Smokeless Tobacco Never   BP Readings from Last 3 Encounters:  09/06/21 132/86  06/14/21 120/70  05/12/21 138/83   Pulse Readings from Last 3 Encounters:  09/06/21 (!) 108  06/14/21 (!) 111  05/12/21 80   Wt Readings from Last 3 Encounters:  09/06/21 217 lb 3.2 oz (98.5 kg)  06/14/21 213 lb 3.2 oz (96.7 kg)  05/12/21 214 lb (97.1 kg)   BMI Readings from Last 3 Encounters:  09/06/21 37.28 kg/m  06/14/21 36.60 kg/m  05/12/21 36.73 kg/m    Assessment/Interventions: Review of patient past medical history, allergies, medications, health status, including review of consultants reports, laboratory and other test data, was performed as part of comprehensive evaluation and provision of chronic care management services.   SDOH:  (Social Determinants of Health) assessments and interventions performed: Yes SDOH Interventions    Flowsheet Row Chronic Care Management from 05/15/2021 in [place] Management from 03/20/2021 in [place] from 03/08/2021 in [place] Management from 02/21/2021 in [place] Management from 01/23/2021 in [place] Visit from 02/28/2017 in [place] Interventions        Food Insecurity Interventions -- -- Intervention Not Indicated -- -- --  Transportation Interventions -- -- Intervention Not Indicated -- -- --  Depression Interventions/Treatment  -- -- -- -- -- Currently on Treatment  Financial Strain Interventions Intervention Not Indicated Intervention Not Indicated Intervention Not  Indicated Intervention Not Indicated Other (Comment)  [PAP] --  Physical Activity Interventions -- -- Intervention Not Indicated -- -- --  Stress Interventions -- -- Intervention Not Indicated -- -- --  Social Connections Interventions -- -- Intervention Not Indicated -- -- --          SDOH Screenings   Food Insecurity: No Food Insecurity (03/08/2021)  Housing: Low Risk  (03/08/2021)  Transportation Needs: No Transportation Needs (03/08/2021)  Alcohol Screen: Low Risk  (09/06/2021)  Depression (PHQ2-9): Medium Risk (09/06/2021)  Financial Resource Strain: Low Risk  (05/15/2021)  Physical Activity:  Insufficiently Active (03/08/2021)  Social Connections: Socially Isolated (03/08/2021)  Stress: No Stress Concern Present (03/08/2021)  Tobacco Use: Low Risk  (09/06/2021)    CCM Care Plan  Allergies  Allergen Reactions   Penicillins Swelling    Medications Reviewed Today     Reviewed by [name], PA-C (Physician Assistant Certified) on 75/10/25 at [place] List Status: <None>   Medication Order Taking? Sig Documenting Provider Last Dose Status Informant  albuterol (VENTOLIN HFA) 108 (90 Base) MCG/ACT inhaler [id number] Yes INHALE 1-2 PUFFS INTO THE LUNGS EVERY 4 TO 6 HOURS AS NEEDED FOR WHEEZING OR SHORTNESS OF BREATH. [name], PA-C Taking Active   baclofen (LIORESAL) 10 MG tablet [id number] Yes Take 1 tablet (10 mg total) by mouth 2 (two) times daily as needed for muscle spasms. Danton Clap, PA-C Taking Active   blood glucose meter kit and supplies [id number] Yes Dispense based on patient and insurance preference. Use daily as directed. 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 Virginia Crews, MD Taking Active   budesonide-formoterol [facility]) 80-4.5 MCG/ACT inhaler [id number] Yes Inhale 2 puffs into the lungs 2 (two) times daily. [name], PA-C Taking Active   Dulaglutide (TRULICITY) 3 IW/5.8KD SOPN [id number] Yes Inject 3 mg as directed once a week. [name], PA-C Taking  Active   glipiZIDE (GLUCOTROL XL) 10 MG 24 hr tablet [id number] Yes TAKE ONE TABLET BY MOUTH EVERY MORNING with breakfast [name], PA-C Taking Active   glucose blood (ONETOUCH VERIO) test strip [id number] Yes Use to check blood sugars once daily as instructed Bacigalupo, Dionne Bucy, MD Taking Active   losartan (COZAAR) 50 MG tablet [id number] Yes Take 1 tablet (50 mg total) by mouth daily. [name], PA-C Taking Active   montelukast (SINGULAIR) 10 MG tablet [id number] Yes Take 1 tablet (10 mg total) by mouth daily. [name], PA-C Taking Active   Multiple Vitamin (MULTIVITAMIN WITH MINERALS) TABS tablet [id number] Yes Take 1 tablet by mouth daily. Centrum Silver Women's 50+ [provider] Taking Active   NON FORMULARY [id number] Yes Take 1 capsule by mouth in the morning and at bedtime. AZO Bladder Control Supplement [provider] Taking Active   OneTouch Delica Lancets 48J MISC [id number] Yes Use to check blood sugars once daily as directed. Virginia Crews, MD Taking Active   oxybutynin (DITROPAN XL) 10 MG 24 hr tablet [id number] Yes Take 1 tablet (10 mg total) by mouth at bedtime. [name], PA-C Taking Active   propranolol ER (INDERAL LA) 60 MG 24 hr capsule [id number] Yes Take 1 capsule (60 mg total) by mouth daily. [name], PA-C  Active   Rimegepant Sulfate (NURTEC) 75 MG TBDP [id number] Yes Take 1 mg by mouth daily as needed. At onset of migraine/aura [name], PA-C Taking Active   rosuvastatin (CRESTOR) 5 MG tablet [id number] Yes Take 1 tablet (5 mg total) by mouth daily. [name], PA-C Taking Active   traMADol (ULTRAM) 50 MG tablet [id number] Yes Take 1 tablet (50 mg total) by mouth every 6 (six) hours as needed. [name], PA-C Taking Active     Discontinued 06/25/19 1840             Patient Active Problem List   Diagnosis Date Noted   Migraine with aura and without status migrainosus, not intractable 06/14/2021    Peptic ulcer disease with hemorrhage 05/12/2021   Anemia due to acute blood loss 05/12/2021   Hypertension associated with diabetes ([place]) 05/02/2021   Chronic knee pain after total replacement  of left knee joint 05/02/2021   OAB (overactive bladder) 01/12/2021   History of total knee arthroplasty 05/09/2020   Morbid obesity ([place]) 08/28/2019   Stiffness of right knee 06/18/2018   Type 2 diabetes mellitus with stage 3a chronic kidney disease, without long-term current use of insulin ([place]) 10/17/2015   Carpal tunnel syndrome of left wrist 10/14/2015   Bipolar depression ([place]) 07/12/2015   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   Asthmatic bronchitis 08/02/2014   Hyperlipidemia associated with type 2 diabetes mellitus ([place]) 08/02/2014   History of suicidal ideation 08/02/2014   Avitaminosis D 08/02/2014   Generalized hyperhidrosis 08/02/2014   Airway hyperreactivity 08/02/2014   Anxiety disorder 08/02/2014   Insomnia 08/02/2014   Major depressive disorder, single episode 08/02/2014   Shortness of breath 08/05/2009    Immunization History  Administered Date(s) Administered   Influenza,inj,Quad PF,6+ Mos 01/06/2018, 12/12/2018, 05/02/2021   Pneumococcal Polysaccharide-23 07/30/2012   Tdap 07/28/2009    Conditions to be addressed/monitored:  Hyperlipidemia, Diabetes, Asthma, Overactive Bladder, and Bipolar Disorder, Chronic Pain, Chronic Migraines  Care Plan : General Pharmacy (Adult)  Updates made by Germaine Pomfret, RPH since 01/26/2022 12:00 AM     Problem: Hyperlipidemia, Diabetes, Asthma, Overactive Bladder, and Bipolar Disorder, Chronic Pain, Chronic Migraines   Priority: High     Long-Range Goal: Patient-Specific Goal   Start Date: 01/23/2021  Expected End Date: 08/31/2022  This Visit's Progress: On track  Recent Progress: On track  Priority: High  Note:   Current Barriers:  Unable to independently afford treatment regimen Unable to achieve control of diabetes    Pharmacist Clinical Goal(s):  Patient will verbalize ability to afford treatment regimen achieve control of Diabetes as evidenced by A1c less than 8% through collaboration with PharmD and provider.   Interventions: 1:1 collaboration with [name], PA-C regarding development and update of comprehensive plan of care as evidenced by provider attestation and co-signature Inter-disciplinary care team collaboration (see longitudinal plan of care) Comprehensive medication review performed; medication list updated in electronic medical record  Hypertension (BP goal <130/80) -Uncontrolled:  -Current treatment: Losartan 50 mg daily: Appropriate, Query effective  Propanolol ER 60 mg daily  -Medications previously tried:  -Current home readings: 131/83, 135/93, 130/96, 126/81, 171/86 (Thanksgiving)   -Denies hypotensive/hypertensive symptoms -Patient blood pressure is mildly elevated. Discussed benefits of maximally optimized ARB for renal protection. Patient was amenable to increasing her losartan.   Increase losartan to 100 mg daily.   Hyperlipidemia: (LDL goal < 70) -Controlled: Not addressed during this visit. -Current treatment: Rosuvastatin 5 mg daily (AM) -Medications previously tried: NA  -Recommended to continue current medication  Diabetes (A1c goal <7%) -Controlled -Nephrology visit scheduled end of September  -Current medications: Glipizide XL 10 mg daily: Appropriate, Query effective  Trulicity 3 mg weekly: Appropriate, Query effective -Medications previously tried: Metformin (sluggish, stomach pain), finerenone (cost)   -Current home glucose readings fasting glucose: 120-127   -Denies hypoglycemia symptoms:   -Current meal patterns: Appetite significantly reduced. Struggles with sweet tooth. Endorses indulging in sweets once monthly. Pastries  -Current exercise: Limited by leg pain following knee replacement/femur fracture. Chair exercises 3 times weekly.   -Continue current medications   Asthma (Goal: control symptoms and prevent exacerbations) -Improved -Current treatment  Ventolin HFA 1-2 puffs every 4-6 hours as needed Symbicort 80-4.5 2 puffs twice daily  Montelukast 10 mg daily  -Medications previously tried: NA  -Exacerbations requiring treatment in last 6 months: None In the past four weeks, has the patient had:  Daytime asthma symptoms more than twice weekly?   No Any instance of night waking due to asthma?   No Utilized albuterol reliever for symptoms more than twice weekly? No: 2-3 x in past month  Any activity limitation due to asthma?    Yes -Patient reports consistent use of maintenance inhaler.   Bipolar Disorder (Goal: Achieve symptom remission) -Not ideally controlled: Not addressed during this visit. -Current treatment: None  -Medications previously tried/failed: Divalproex, Benztropine, Wellbutrin, Citalopram, Sertraline  -Counseled on importance of keeping upcoming psychiatry appointment   Migraines (Goal: Prevent migraines) -Controlled -Current treatment  Propanolol ER 60 mg daily  Rimegepant 75 mg daily as needed: Appropriate, Query effective  -Medications previously tried: Divalproex, Excedrin (GI Bleed) -Migraine control improved significantly since starting propranolol. Reports 3 migraine episodes in the past month. Her episodes are not as severe and do not last as long.   Chronic Pain (Goal: Minimize symptoms) -Controlled: Not addressed during this visit. -Managed by Carlynn Spry, PA-C (Ortho) -Current treatment  Baclofen 10 mg twice daily as needed  Boswellia extract 500 mg twice daily  Tramadol 50 mg q6h PRN  -Medications previously tried: NA -Patient also reports CBD gummy use.  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  -Requires rare use of tramadol   -Recommended to continue current medication  Overactive Bladder (Goal: Minimize  symptoms) -Controlled -Current treatment  Oxybutynin XL 10 mg daily  -Medications previously tried: NA -Excellent symptomatic control.   -Recommended to continue current medication  Patient Goals/Self-Care Activities Patient will:  - check glucose daily before breakfast, document, and provide at future appointments  Follow Up Plan: Telephone follow up appointment with care management team member scheduled for:  04/27/2022 at 11:00 AM    Medication Assistance: None required.  Patient affirms current coverage meets needs. Patient receives [employer], Musician through [employer].   Compliance/Adherence/Medication fill history: Care Gaps: Covid Vaccine  HIV Screening  Shingrix  Pneumonia  Foot Exam  Influenza   Star-Rating Drugs: Rosuvastatin 5 mg: LF 08/15/21 for 30-DS from [facility]  Patient's preferred pharmacy is:  [facility] - [place], [place] - [address] [place] [address] Phone: [phone] Fax: [phone]  [facility] [place], [place] [address] [facility] [address] [place] [address] Phone: [phone] Fax: [phone]  Uses pill box? Yes Pt endorses 100% compliance  Patient decided to: Utilize [facility] for medication synchronization, packaging and delivery  Care Plan and Follow Up Patient Decision:  Patient agrees to Care Plan and Follow-up.  Plan: Telephone follow up appointment with care management team member scheduled for:  04/27/2022 at 11:00 AM  Junius Argyle, PharmD, [place], [place] [phone]

## 2022-02-02 ENCOUNTER — Telehealth: Payer: Self-pay

## 2022-02-02 NOTE — Progress Notes (Signed)
Chronic Care Management Pharmacy Assistant   Name: Joanna Bishop  MRN: [id number] DOB: 1955-07-20  Reason for Encounter: Medication Review/Medication Coordination for [facility]   Recent office visits:  None ID  Recent consult visits:  None ID  Hospital visits:  None in previous 6 months  Medications: Outpatient Encounter Medications as of 02/02/2022  Medication Sig   acetaminophen (TYLENOL) 650 MG CR tablet Take 650 mg by mouth every 8 (eight) hours as needed for pain.   albuterol (VENTOLIN HFA) 108 (90 Base) MCG/ACT inhaler INHALE 1-2 PUFFS BY MOUTH into THE lungs every 4-6 hours AS NEEDED FOR WHEEZING AND/OR SHORTNESS OF BREATH   baclofen (LIORESAL) 10 MG tablet Take 1 tablet (10 mg total) by mouth 2 (two) times daily as needed for muscle spasms.   blood glucose meter kit and supplies Dispense based on patient and insurance preference. Use daily as directed. 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   budesonide-formoterol (SYMBICORT) 80-4.5 MCG/ACT inhaler Inhale 2 puffs into the lungs 2 (two) times daily.   Dulaglutide (TRULICITY) 3 WU/9.8JX SOPN Inject 3 mg as directed once a week. For Diabetes.   glipiZIDE (GLUCOTROL XL) 10 MG 24 hr tablet TAKE ONE TABLET BY MOUTH EVERY MORNING   glucose blood (ONETOUCH VERIO) test strip Use to check blood sugars once daily as instructed   losartan (COZAAR) 100 MG tablet Take 1 tablet (100 mg total) by mouth daily.   montelukast (SINGULAIR) 10 MG tablet TAKE ONE TABLET BY MOUTH EVERYDAY AT BEDTIME   Multiple Vitamin (MULTIVITAMIN WITH MINERALS) TABS tablet Take 1 tablet by mouth daily. Centrum Silver Women's 50+   NON FORMULARY Take 1 capsule by mouth in the morning and at bedtime. AZO Bladder Control Supplement   OneTouch Delica Lancets 91Y MISC Use to check blood sugars once daily as directed.   oxybutynin (DITROPAN-XL) 10 MG 24 hr tablet TAKE ONE TABLET BY MOUTH EVERYDAY AT BEDTIME   propranolol ER (INDERAL LA) 60 MG 24 hr capsule Take 1 capsule (60  mg total) by mouth daily.   Rimegepant Sulfate (NURTEC) 75 MG TBDP Take 1 mg by mouth daily as needed. At onset of migraine/aura   rosuvastatin (CRESTOR) 5 MG tablet TAKE ONE TABLET BY MOUTH ONCE DAILY   traMADol (ULTRAM) 50 MG tablet Take 1 tablet (50 mg total) by mouth every 6 (six) hours as needed.   [DISCONTINUED] XARELTO 10 MG TABS tablet Take 10 mg by mouth every morning.   No facility-administered encounter medications on file as of 02/02/2022.   Care Gaps: COVID-19 Vaccine HIV Screening Zoster Vaccines Fecal DNA (Cologuard) Diabetic Foot Exam DTap/Tdap/Td Diabetic Eye Exam Pap Smear Influenza Vaccine  Star Rating Drugs: Losartan 50 mg last filled on 01/26/2022 for a 22-Day Supply with [facility] Glipizide 10 mg last filled on 01/10/2022 for a 30-Day Supply with [facility] Rosuvastatin 5 mg last filled on 01/10/2022 for a 30-Day supply with [facility] Trulicity 3 mg patient receives this medication from the [place] so not sure when the last fill date was  BP Readings from Last 3 Encounters:  09/06/21 132/86  06/14/21 120/70  05/12/21 138/83    Lab Results  Component Value Date   HGBA1C 6.0 (A) 09/06/2021    Patient obtains medications through Vials  30 Days   Last adherence delivery included: Losartan 50 mg 1 tablet daily (Breakfast) Albuterol 108 Inhaler inhale 1-2 puffs every every 4-6 hours prn for wheezing or shortness of breath Glipizide 10 mg 1 tablet daily (Breakfast) Montelukast  10 mg 1 tablet daily (Bedtime) Rosuvastatin 5 mg 1 tablet daily (Breakfast) Oxybutynin 10 mg 1 tablet daily (Bedtime) Propanolol ER 60 mg daily  OneTouch Strips OneTouch Lancets  Patient declined the following medications for the month of November: Nurtec 75 mg 1 tablet as needed at onset of migraine-Patient receives this medication through [facility] Trulicity Inject 1.5 mg into the skin once a week- Patient is getting this medication through [facility] Symbicort  80-4.5 mcg Inhaler Inhale 2 puffs twice daily-Patient will be getting this medication from [facility]  Patient is due for next adherence delivery on: 02/14/2022 1st Route.  Called patient and reviewed medications and coordinated delivery.  This delivery to include: Losartan 50 mg 1 tablet daily (Breakfast) Albuterol 108 Inhaler inhale 1-2 puffs every every 4-6 hours prn for wheezing or shortness of breath Glipizide 10 mg 1 tablet daily (Breakfast) Montelukast 10 mg 1 tablet daily (Bedtime) Rosuvastatin 5 mg 1 tablet daily (Breakfast) Oxybutynin 10 mg 1 tablet daily (Bedtime) Propanolol ER 60 mg daily   Patient declined the following medications for the month of December: Nurtec 75 mg 1 tablet as needed at onset of migraine-Patient receives this medication through [facility] Trulicity Inject 1.5 mg into the skin once a week- Patient is getting this medication through [facility] Symbicort 80-4.5 mcg Inhaler Inhale 2 puffs twice daily-Patient will be getting this medication from [place]  Patient needs refills for the month of December: No refills needed for this delivery  Confirmed delivery date of 02/14/2022 1st Route, advised patient that pharmacy will contact them the morning of delivery.  Patient has a follow-up appointment with CPP on 04/27/2022 @ 1100.  Lynann Bologna, CPA/CMA Clinical Pharmacist Assistant Phone: [phone]

## 2022-02-25 DIAGNOSIS — F319 Bipolar disorder, unspecified: Secondary | ICD-10-CM

## 2022-02-25 DIAGNOSIS — E1159 Type 2 diabetes mellitus with other circulatory complications: Secondary | ICD-10-CM

## 2022-02-25 DIAGNOSIS — J45909 Unspecified asthma, uncomplicated: Secondary | ICD-10-CM

## 2022-03-05 ENCOUNTER — Other Ambulatory Visit: Payer: Self-pay | Admitting: Physician Assistant

## 2022-03-05 DIAGNOSIS — G43109 Migraine with aura, not intractable, without status migrainosus: Secondary | ICD-10-CM

## 2022-03-06 ENCOUNTER — Telehealth: Payer: Self-pay

## 2022-03-06 NOTE — Progress Notes (Signed)
Care Management & Coordination Services Pharmacy Team  Reason for Encounter: Medication coordination and delivery  Contacted patient on 03/06/2022 to discuss medications   Recent office visits:  None ID  Recent consult visits:  None ID  Hospital visits:  None in previous 6 months  Medications: Outpatient Encounter Medications as of 03/06/2022  Medication Sig   acetaminophen (TYLENOL) 650 MG CR tablet Take 650 mg by mouth every 8 (eight) hours as needed for pain.   albuterol (VENTOLIN HFA) 108 (90 Base) MCG/ACT inhaler INHALE 1-2 PUFFS BY MOUTH into THE lungs every 4-6 hours AS NEEDED FOR WHEEZING AND/OR SHORTNESS OF BREATH   baclofen (LIORESAL) 10 MG tablet Take 1 tablet (10 mg total) by mouth 2 (two) times daily as needed for muscle spasms.   blood glucose meter kit and supplies Dispense based on patient and insurance preference. Use daily as directed. 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   budesonide-formoterol (SYMBICORT) 80-4.5 MCG/ACT inhaler Inhale 2 puffs into the lungs 2 (two) times daily.   Dulaglutide (TRULICITY) 3 GQ/6.7YP SOPN Inject 3 mg as directed once a week. For Diabetes.   glipiZIDE (GLUCOTROL XL) 10 MG 24 hr tablet TAKE ONE TABLET BY MOUTH EVERY MORNING   glucose blood (ONETOUCH VERIO) test strip Use to check blood sugars once daily as instructed   losartan (COZAAR) 100 MG tablet Take 1 tablet (100 mg total) by mouth daily.   montelukast (SINGULAIR) 10 MG tablet TAKE ONE TABLET BY MOUTH EVERYDAY AT BEDTIME   Multiple Vitamin (MULTIVITAMIN WITH MINERALS) TABS tablet Take 1 tablet by mouth daily. Centrum Silver Women's 50+   NON FORMULARY Take 1 capsule by mouth in the morning and at bedtime. AZO Bladder Control Supplement   OneTouch Delica Lancets 95K MISC Use to check blood sugars once daily as directed.   oxybutynin (DITROPAN-XL) 10 MG 24 hr tablet TAKE ONE TABLET BY MOUTH EVERYDAY AT BEDTIME   propranolol ER (INDERAL LA) 60 MG 24 hr capsule TAKE ONE CAPSULE BY MOUTH ONCE DAILY    Rimegepant Sulfate (NURTEC) 75 MG TBDP Take 1 mg by mouth daily as needed. At onset of migraine/aura   rosuvastatin (CRESTOR) 5 MG tablet TAKE ONE TABLET BY MOUTH ONCE DAILY   traMADol (ULTRAM) 50 MG tablet Take 1 tablet (50 mg total) by mouth every 6 (six) hours as needed.   [DISCONTINUED] XARELTO 10 MG TABS tablet Take 10 mg by mouth every morning.   No facility-administered encounter medications on file as of 03/06/2022.   BP Readings from Last 3 Encounters:  09/06/21 132/86  06/14/21 120/70  05/12/21 138/83    Pulse Readings from Last 3 Encounters:  09/06/21 (!) 108  06/14/21 (!) 111  05/12/21 80    Lab Results  Component Value Date/Time   HGBA1C 6.0 (A) 09/06/2021 09:45 AM   HGBA1C 13.8 (A) 01/12/2021 01:33 PM   HGBA1C 8.8 (H) 03/12/2018 09:32 AM   HGBA1C 6.4 (H) 12/20/2017 09:24 AM   Lab Results  Component Value Date   CREATININE 1.17 (H) 09/06/2021   BUN 23 09/06/2021   GFRNONAA 60 (L) 03/09/2021   GFRAA >60 06/25/2019   NA 137 09/06/2021   K 4.5 09/06/2021   CALCIUM 9.5 09/06/2021   CO2 19 (L) 09/06/2021   Care Gaps: COVID-19 Vaccine HIV Screening Zoster Vaccines Fecal DNA (Cologuard) Diabetic Foot Exam DTap/Tdap/Td Diabetic Eye Exam Pap Smear Influenza Vaccine Mammogram   Star Rating Drugs: Losartan 50 mg last filled on 02/13/2022 for a 30-Day Supply with [facility] Glipizide 10 mg  last filled on 02/13/2022 for a 30-Day Supply with [facility] Rosuvastatin 5 mg last filled on 02/13/2022 for a 30-Day supply with [facility] Trulicity 3 mg patient receives this medication from the [place] so not sure when the last fill date was  Last adherence delivery date:02/14/2022      Patient is due for next adherence delivery on: 03/16/2022  Spoke with patient on 03/06/2022 reviewed medications and coordinated delivery.  This delivery to include: Vials  30 Days  Losartan 50 mg 1 tablet daily (Breakfast) Glipizide 10 mg 1 tablet daily  (Breakfast) Montelukast 10 mg 1 tablet daily (Bedtime) Rosuvastatin 5 mg 1 tablet daily (Breakfast) Oxybutynin 10 mg 1 tablet daily (Bedtime) Propanolol ER 60 mg daily   Patient declined the following medications this month: OneTouch Strips - Adequate supply OneTouch Lancets- Adequate supply Albuterol 108 Inhaler inhale 1-2 puffs every every 4-6 hours prn for wheezing or shortness of breath  Nurtec 75 mg 1 tablet as needed at onset of migraine-Patient receives this medication through [facility] Trulicity Inject 1.5 mg into the skin once a week- Patient is getting this medication through [facility] Symbicort 80-4.5 mcg Inhaler Inhale 2 puffs twice daily-Patient will be getting this medication from [facility]  No refill request needed.  Confirmed delivery date of 03/16/2022 (Second route), advised patient that pharmacy will contact them the morning of delivery.   Any concerns about your medications? No  How often do you forget or accidentally miss a dose? Never  Cycle dispensing form sent to [employer] for review.  Patient has a follow-up appointment with CPP on 04/27/2022 @ 1100.   Everlean Cherry Clinical Pharmacist Assistant [phone]

## 2022-03-12 ENCOUNTER — Ambulatory Visit (INDEPENDENT_AMBULATORY_CARE_PROVIDER_SITE_OTHER): Payer: Medicare HMO

## 2022-03-12 VITALS — Ht 64.0 in | Wt 213.0 lb

## 2022-03-12 DIAGNOSIS — Z0000 Encounter for general adult medical examination without abnormal findings: Secondary | ICD-10-CM

## 2022-03-12 NOTE — Progress Notes (Signed)
Virtual Visit via Telephone Note  I connected with  Joanna Bishop on 03/12/22 at  8:45 AM EST by telephone and verified that I am speaking with the correct person using two identifiers.  Location: Patient: home Provider: [facility] Persons participating in the virtual visit: [place]   I discussed the limitations, risks, security and privacy concerns of performing an evaluation and management service by telephone and the availability of in person appointments. The patient expressed understanding and agreed to proceed.  Interactive audio and video telecommunications were attempted between this nurse and patient, however failed, due to patient having technical difficulties OR patient did not have access to video capability.  We continued and completed visit with audio only.  Some vital signs may be absent or patient reported.   Joanna David, LPN  Subjective:   Joanna Bishop is a 67 y.o. female who presents for Medicare Annual (Subsequent) preventive examination.  Review of Systems     Cardiac Risk Factors include: advanced age (>68men, >74 women);diabetes mellitus;hypertension     Objective:    Today's Vitals   03/12/22 0856  PainSc: 4    There is no height or weight on file to calculate BMI.     03/12/2022    9:01 AM 03/09/2021    6:07 PM 03/08/2021    9:08 AM 03/02/2020    9:08 AM 10/18/2015    5:33 PM 07/15/2015   12:12 PM 07/13/2015    2:00 PM  Advanced Directives  Does Patient Have a Medical Advance Directive? No;Yes Yes No Yes No    Type of Paramedic of [place];Living will [place];Living will  [place];Living will     Does patient want to make changes to medical advance directive? Yes (Inpatient - patient defers changing a medical advance directive at this time - Information given)        Copy of [place] in Chart? No - copy requested   No - copy requested     Would  patient like information on creating a medical advance directive? No - Patient declined  No - Patient declined  No - patient declined information       Information is confidential and restricted. Go to Review Flowsheets to unlock data.    Current Medications (verified) Outpatient Encounter Medications as of 03/12/2022  Medication Sig   acetaminophen (TYLENOL) 650 MG CR tablet Take 650 mg by mouth every 8 (eight) hours as needed for pain.   albuterol (VENTOLIN HFA) 108 (90 Base) MCG/ACT inhaler INHALE 1-2 PUFFS BY MOUTH into THE lungs every 4-6 hours AS NEEDED FOR WHEEZING AND/OR SHORTNESS OF BREATH   blood glucose meter kit and supplies Dispense based on patient and insurance preference. Use daily as directed. 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   budesonide-formoterol (SYMBICORT) 80-4.5 MCG/ACT inhaler Inhale 2 puffs into the lungs 2 (two) times daily.   Dulaglutide (TRULICITY) 3 JH/4.1DE SOPN Inject 3 mg as directed once a week. For Diabetes.   glipiZIDE (GLUCOTROL XL) 10 MG 24 hr tablet TAKE ONE TABLET BY MOUTH EVERY MORNING   glucose blood (ONETOUCH VERIO) test strip Use to check blood sugars once daily as instructed   losartan (COZAAR) 100 MG tablet Take 1 tablet (100 mg total) by mouth daily.   montelukast (SINGULAIR) 10 MG tablet TAKE ONE TABLET BY MOUTH EVERYDAY AT BEDTIME   Multiple Vitamin (MULTIVITAMIN WITH MINERALS) TABS tablet Take 1 tablet by mouth daily. Centrum Silver [employer]  50+   NON FORMULARY Take 1 capsule by mouth in the morning and at bedtime. AZO Bladder Control Supplement   OneTouch Delica Lancets 33G MISC Use to check blood sugars once daily as directed.   oxybutynin (DITROPAN-XL) 10 MG 24 hr tablet TAKE ONE TABLET BY MOUTH EVERYDAY AT BEDTIME   propranolol ER (INDERAL LA) 60 MG 24 hr capsule TAKE ONE CAPSULE BY MOUTH ONCE DAILY   Rimegepant Sulfate (NURTEC) 75 MG TBDP Take 1 mg by mouth daily as needed. At onset of migraine/aura   rosuvastatin (CRESTOR) 5 MG tablet TAKE ONE TABLET BY  MOUTH ONCE DAILY   baclofen (LIORESAL) 10 MG tablet Take 1 tablet (10 mg total) by mouth 2 (two) times daily as needed for muscle spasms. (Patient not taking: Reported on 03/12/2022)   traMADol (ULTRAM) 50 MG tablet Take 1 tablet (50 mg total) by mouth every 6 (six) hours as needed. (Patient not taking: Reported on 03/12/2022)   [DISCONTINUED] XARELTO 10 MG TABS tablet Take 10 mg by mouth every morning.   No facility-administered encounter medications on file as of 03/12/2022.    Allergies (verified) Penicillins   History: Past Medical History:  Diagnosis Date   Anxiety    Arthritis    shoulders, legs   Asthma    Depression    Diabetes mellitus without complication (HCC)    Hyperlipidemia    Migraine headache    2x/month   Vertigo    Past Surgical History:  Procedure Laterality Date   ABDOMINAL HYSTERECTOMY     ARTHROPLASTY     CHOLECYSTECTOMY     FEMUR FRACTURE SURGERY     TOTAL KNEE ARTHROPLASTY Left    Family History  Problem Relation Age of Onset   Diabetes Mother    COPD Mother    Diabetes Father    Coronary artery disease Father    Coronary artery disease Maternal Grandmother    Heart attack Maternal Grandmother    Diabetes Paternal Grandmother    Social History   Socioeconomic History   Marital status: Widowed    Spouse name: Not on file   Number of children: 3   Years of education: Not on file   Highest education level: Some college, no degree  Occupational History   Occupation: disability  Tobacco Use   Smoking status: Never   Smokeless tobacco: Never  Vaping Use   Vaping Use: Never used  Substance and Sexual Activity   Alcohol use: Not Currently    Comment: 1 drink every 3 months   Drug use: Never   Sexual activity: Not Currently  Other Topics Concern   Not on file  Social History Narrative   Not on file   Social Determinants of Health   Financial Resource Strain: Medium Risk (03/12/2022)   Overall Financial Resource Strain (CARDIA)     Difficulty of Paying Living Expenses: Somewhat hard  Food Insecurity: No Food Insecurity (03/12/2022)   Hunger Vital Sign    Worried About Running Out of Food in the Last Year: Never true    Ran Out of Food in the Last Year: Never true  Transportation Needs: No Transportation Needs (03/12/2022)   PRAPARE - Administrator, Civil Service (Medical): No    Lack of Transportation (Non-Medical): No  Physical Activity: Insufficiently Active (03/12/2022)   Exercise Vital Sign    Days of Exercise per Week: 2 days    Minutes of Exercise per Session: 20 min  Stress: No Stress Concern Present (03/12/2022)  [employer] of Occupational Health - Occupational Stress Questionnaire    Feeling of Stress : Not at all  Social Connections: Socially Isolated (03/12/2022)   Social Connection and Isolation Panel [NHANES]    Frequency of Communication with Friends and Family: More than three times a week    Frequency of Social Gatherings with Friends and Family: Once a week    Attends Religious Services: Never    Database administrator or Organizations: No    Attends Banker Meetings: Never    Marital Status: Widowed    Tobacco Counseling Counseling given: Not Answered   Clinical Intake:  Pre-visit preparation completed: Yes  Pain : 0-10 Pain Score: 4  Pain Type: Acute pain Pain Location: Head     Nutritional Risks: None Diabetes: Yes CBG done?: No Did pt. bring in CBG monitor from home?: No  How often do you need to have someone help you when you read instructions, pamphlets, or other written materials from your doctor or pharmacy?: 1 - Never  Diabetic?yes Nutrition Risk Assessment:  Has the patient had any N/V/D within the last 2 months?  No  Does the patient have any non-healing wounds?  No  Has the patient had any unintentional weight loss or weight gain?  No   Diabetes:  Is the patient diabetic?  Yes  If diabetic, was a CBG obtained today?  No  Did the  patient bring in their glucometer from home?  No  How often do you monitor your CBG's? Every day.   Financial Strains and Diabetes Management:  Are you having any financial strains with the device, your supplies or your medication? No .  Does the patient want to be seen by Chronic Care Management for management of their diabetes?  No  Would the patient like to be referred to a Nutritionist or for Diabetic Management?  Yes   Diabetic Exams:  Diabetic Eye Exam: Completed 04/04/20. Overdue for diabetic eye exam. Pt has been advised about the importance in completing this exam.   Diabetic Foot Exam: Completed 01/06/18. Pt has been advised about the importance in completing this exam.    Interpreter Needed?: No  Information entered by :: Kennedy Bucker, LPN   Activities of Daily Living    03/12/2022    9:03 AM 03/08/2022    3:53 PM  In your present state of health, do you have any difficulty performing the following activities:  Hearing? 0 0  Vision? 0 0  Difficulty concentrating or making decisions? 0 0  Walking or climbing stairs? 1 1  Dressing or bathing? 0 0  Doing errands, shopping? 0 0  Preparing Food and eating ? N N  Using the Toilet? N N  In the past six months, have you accidently leaked urine? N Y  Do you have problems with loss of bowel control? N N  Managing your Medications? N N  Managing your Finances? Malvin Johns  Housekeeping or managing your Housekeeping? Malvin Johns    Patient Care Team: Alfredia Ferguson, PA-C as PCP - General (Physician Assistant) Verne Carrow, OD (Optometry) Gaspar Cola, [facility] (Pharmacist)  Indicate any recent Medical Services you may have received from other than [facility] providers in the past year (date may be approximate).     Assessment:   This is a routine wellness examination for [place].  Hearing/Vision screen Hearing Screening - Comments:: No aids Vision Screening - Comments:: Wears glasses- Dr.Shade  Dietary issues and exercise activities  discussed: Current Exercise  Habits: Home exercise routine, Type of exercise: walking, Time (Minutes): 20, Frequency (Times/Week): 2, Weekly Exercise (Minutes/Week): 40, Intensity: Mild   Goals Addressed             This Visit's Progress    DIET - INCREASE WATER INTAKE         Depression Screen    03/12/2022    9:00 AM 09/06/2021    9:04 AM 05/02/2021    1:28 PM 03/08/2021    9:05 AM 01/12/2021    1:38 PM 03/02/2020    9:04 AM 07/07/2018    9:57 AM  PHQ 2/9 Scores  PHQ - 2 Score 0 2 6 0 4 0 2  PHQ- 9 Score 0 7 25 0 20  10    Fall Risk    03/12/2022    9:03 AM 03/08/2022    3:53 PM 09/06/2021    9:04 AM 03/08/2021    9:08 AM 01/12/2021    1:38 PM  Fall Risk   Falls in the past year? 0 0 0 0 1  Number falls in past yr: 0 0 0 0 1  Injury with Fall? 0 0 0 0 0  Risk for fall due to : No Fall Risks  No Fall Risks No Fall Risks History of fall(s)  Follow up Falls prevention discussed;Falls evaluation completed   Falls evaluation completed Falls evaluation completed    FALL RISK PREVENTION PERTAINING TO THE HOME:  Any stairs in or around the home? Yes  If so, are there any without handrails? No  Home free of loose throw rugs in walkways, pet beds, electrical cords, etc? Yes  Adequate lighting in your home to reduce risk of falls? Yes   ASSISTIVE DEVICES UTILIZED TO PREVENT FALLS:  Life alert? No  Use of a cane, walker or w/c? Yes  Grab bars in the bathroom? Yes  Shower chair or bench in shower? Yes  Elevated toilet seat or a handicapped toilet? Yes    Cognitive Function:        03/12/2022    9:10 AM 07/07/2018   10:39 AM  6CIT Screen  What Year? 0 points 0 points  What month? 0 points 0 points  What time? 0 points 0 points  Count back from 20 0 points 0 points  Months in reverse 0 points 0 points  Repeat phrase 2 points 0 points  Total Score 2 points 0 points    Immunizations Immunization History  Administered Date(s) Administered   Influenza,inj,Quad PF,6+  Mos 01/06/2018, 12/12/2018, 05/02/2021   Pneumococcal Polysaccharide-23 07/30/2012   Tdap 07/28/2009    TDAP status: Due, Education has been provided regarding the importance of this vaccine. Advised may receive this vaccine at local pharmacy or Health Dept. Aware to provide a copy of the vaccination record if obtained from local pharmacy or Health Dept. Verbalized acceptance and understanding.  Flu Vaccine status: Due, Education has been provided regarding the importance of this vaccine. Advised may receive this vaccine at local pharmacy or Health Dept. Aware to provide a copy of the vaccination record if obtained from local pharmacy or Health Dept. Verbalized acceptance and understanding.  Pneumococcal vaccine status: Up to date  Covid-19 vaccine status: Declined, Education has been provided regarding the importance of this vaccine but patient still declined. Advised may receive this vaccine at local pharmacy or Health Dept.or vaccine clinic. Aware to provide a copy of the vaccination record if obtained from local pharmacy or Health Dept. Verbalized acceptance and understanding.  Qualifies for Shingles Vaccine? Yes   Zostavax completed No   Shingrix Completed?: No.    Education has been provided regarding the importance of this vaccine. Patient has been advised to call insurance company to determine out of pocket expense if they have not yet received this vaccine. Advised may also receive vaccine at local pharmacy or Health Dept. Verbalized acceptance and understanding.  Screening Tests Health Maintenance  Topic Date Due   COVID-19 Vaccine (1) Never done   HIV Screening  Never done   Zoster Vaccines- Shingrix (1 of 2) Never done   Fecal DNA (Cologuard)  Never done   FOOT EXAM  01/07/2019   DTaP/Tdap/Td (2 - Td or Tdap) 07/29/2019   OPHTHALMOLOGY EXAM  04/04/2021   PAP SMEAR-Modifier  07/06/2021   INFLUENZA VACCINE  09/26/2021   MAMMOGRAM  02/22/2022   HEMOGLOBIN A1C  03/09/2022    Diabetic kidney evaluation - eGFR measurement  09/07/2022   Diabetic kidney evaluation - Urine ACR  09/07/2022   Medicare Annual Wellness (AWV)  03/13/2023   Hepatitis C Screening  Completed   HPV VACCINES  Aged Out   COLONOSCOPY (Pts 45-15yrs Insurance coverage will need to be confirmed)  Discontinued    Health Maintenance  Health Maintenance Due  Topic Date Due   COVID-19 Vaccine (1) Never done   HIV Screening  Never done   Zoster Vaccines- Shingrix (1 of 2) Never done   Fecal DNA (Cologuard)  Never done   FOOT EXAM  01/07/2019   DTaP/Tdap/Td (2 - Td or Tdap) 07/29/2019   OPHTHALMOLOGY EXAM  04/04/2021   PAP SMEAR-Modifier  07/06/2021   INFLUENZA VACCINE  09/26/2021   MAMMOGRAM  02/22/2022   HEMOGLOBIN A1C  03/09/2022    Colorectal cancer screening: Type of screening: Colonoscopy. Completed 11/08/16. Repeat every 5 years  Mammogram status: Completed 08/23/21. Repeat every year  Bone Density status: Completed 04/06/21. Results reflect: Bone density results: NORMAL. Repeat every 5 years.  Lung Cancer Screening: (Low Dose CT Chest recommended if Age 60-80 years, 30 pack-year currently smoking OR have quit w/in 15years.) does not qualify.    Additional Screening:  Hepatitis C Screening: does qualify; Completed 10/14/15  Vision Screening: Recommended annual ophthalmology exams for early detection of glaucoma and other disorders of the eye. Is the patient up to date with their annual eye exam?  Yes  Who is the provider or what is the name of the office in which the patient attends annual eye exams? Dr.Shade If pt is not established with a provider, would they like to be referred to a provider to establish care? No .   Dental Screening: Recommended annual dental exams for proper oral hygiene  Community Resource Referral / Chronic Care Management: CRR required this visit?  No   CCM required this visit?  No      Plan:     I have personally reviewed and noted the following  in the patient's chart:   Medical and social history Use of alcohol, tobacco or illicit drugs  Current medications and supplements including opioid prescriptions. Patient is not currently taking opioid prescriptions. Functional ability and status Nutritional status Physical activity Advanced directives List of other physicians Hospitalizations, surgeries, and ER visits in previous 12 months Vitals Screenings to include cognitive, depression, and falls Referrals and appointments  In addition, I have reviewed and discussed with patient certain preventive protocols, quality metrics, and best practice recommendations. A written personalized care plan for preventive services as well as general preventive  health recommendations were provided to patient.     Joanna David, LPN   [id number]   Nurse Notes: none

## 2022-03-12 NOTE — Patient Instructions (Signed)
[name] , Thank you for taking time to come for your Medicare Wellness Visit. I appreciate your ongoing commitment to your health goals. Please review the following plan we discussed and let me know if I can assist you in the future.   Screening recommendations/referrals: Colonoscopy: 11/08/16 Mammogram: 08/23/21 Bone Density: 04/06/21 Recommended yearly ophthalmology/optometry visit for glaucoma screening and checkup Recommended yearly dental visit for hygiene and checkup  Vaccinations: Influenza vaccine: 05/02/21 Pneumococcal vaccine: 07/30/12 Tdap vaccine: 07/28/09, due if have injury Shingles vaccine: n/d  Covid-19: n/d  Advanced directives: no  Conditions/risks identified: none  Next appointment: Follow up in one year for your annual wellness visit. 03/18/23 @ 8:45 am by phone  Preventive Care 40-64 Years, Female Preventive care refers to lifestyle choices and visits with your health care provider that can promote health and wellness. What does preventive care include? A yearly physical exam. This is also called an annual well check. Dental exams once or twice a year. Routine eye exams. Ask your health care provider how often you should have your eyes checked. Personal lifestyle choices, including: Daily care of your teeth and gums. Regular physical activity. Eating a healthy diet. Avoiding tobacco and drug use. Limiting alcohol use. Practicing safe sex. Taking low-dose aspirin daily starting at age 70. Taking vitamin and mineral supplements as recommended by your health care provider. What happens during an annual well check? The services and screenings done by your health care provider during your annual well check will depend on your age, overall health, lifestyle risk factors, and family history of disease. Counseling  Your health care provider may ask you questions about your: Alcohol use. Tobacco use. Drug use. Emotional well-being. Home and relationship  well-being. Sexual activity. Eating habits. Work and work Statistician. Method of birth control. Menstrual cycle. Pregnancy history. Screening  You may have the following tests or measurements: Height, weight, and BMI. Blood pressure. Lipid and cholesterol levels. These may be checked every 5 years, or more frequently if you are over 5 years old. Skin check. Lung cancer screening. You may have this screening every year starting at age 58 if you have a 30-pack-year history of smoking and currently smoke or have quit within the past 15 years. Fecal occult blood test (FOBT) of the stool. You may have this test every year starting at age 46. Flexible sigmoidoscopy or colonoscopy. You may have a sigmoidoscopy every 5 years or a colonoscopy every 10 years starting at age 46. Hepatitis C blood test. Hepatitis B blood test. Sexually transmitted disease (STD) testing. Diabetes screening. This is done by checking your blood sugar (glucose) after you have not eaten for a while (fasting). You may have this done every 1-3 years. Mammogram. This may be done every 1-2 years. Talk to your health care provider about when you should start having regular mammograms. This may depend on whether you have a family history of breast cancer. BRCA-related cancer screening. This may be done if you have a family history of breast, ovarian, tubal, or peritoneal cancers. Pelvic exam and Pap test. This may be done every 3 years starting at age 98. Starting at age 80, this may be done every 5 years if you have a Pap test in combination with an HPV test. Bone density scan. This is done to screen for osteoporosis. You may have this scan if you are at high risk for osteoporosis. Discuss your test results, treatment options, and if necessary, the need for more tests with your health care  provider. Vaccines  Your health care provider may recommend certain vaccines, such as: Influenza vaccine. This is recommended every  year. Tetanus, diphtheria, and acellular pertussis (Tdap, Td) vaccine. You may need a Td booster every 10 years. Zoster vaccine. You may need this after age 64. Pneumococcal 13-valent conjugate (PCV13) vaccine. You may need this if you have certain conditions and were not previously vaccinated. Pneumococcal polysaccharide (PPSV23) vaccine. You may need one or two doses if you smoke cigarettes or if you have certain conditions. Talk to your health care provider about which screenings and vaccines you need and how often you need them. This information is not intended to replace advice given to you by your health care provider. Make sure you discuss any questions you have with your health care provider. Document Released: 03/11/2015 Document Revised: 11/02/2015 Document Reviewed: 12/14/2014 Elsevier Interactive Patient Education  2017 [place] Prevention in the Home Falls can cause injuries. They can happen to people of all ages. There are many things you can do to make your home safe and to help prevent falls. What can I do on the outside of my home? Regularly fix the edges of walkways and driveways and fix any cracks. Remove anything that might make you trip as you walk through a door, such as a raised step or threshold. Trim any bushes or trees on the path to your home. Use bright outdoor lighting. Clear any walking paths of anything that might make someone trip, such as rocks or tools. Regularly check to see if handrails are loose or broken. Make sure that both sides of any steps have handrails. Any raised decks and porches should have guardrails on the edges. Have any leaves, snow, or ice cleared regularly. Use sand or salt on walking paths during winter. Clean up any spills in your garage right away. This includes oil or grease spills. What can I do in the bathroom? Use night lights. Install grab bars by the toilet and in the tub and shower. Do not use towel bars as grab  bars. Use non-skid mats or decals in the tub or shower. If you need to sit down in the shower, use a plastic, non-slip stool. Keep the floor dry. Clean up any water that spills on the floor as soon as it happens. Remove soap buildup in the tub or shower regularly. Attach bath mats securely with double-sided non-slip rug tape. Do not have throw rugs and other things on the floor that can make you trip. What can I do in the bedroom? Use night lights. Make sure that you have a light by your bed that is easy to reach. Do not use any sheets or blankets that are too big for your bed. They should not hang down onto the floor. Have a firm chair that has side arms. You can use this for support while you get dressed. Do not have throw rugs and other things on the floor that can make you trip. What can I do in the kitchen? Clean up any spills right away. Avoid walking on wet floors. Keep items that you use a lot in easy-to-reach places. If you need to reach something above you, use a strong step stool that has a grab bar. Keep electrical cords out of the way. Do not use floor polish or wax that makes floors slippery. If you must use wax, use non-skid floor wax. Do not have throw rugs and other things on the floor that can make  you trip. What can I do with my stairs? Do not leave any items on the stairs. Make sure that there are handrails on both sides of the stairs and use them. Fix handrails that are broken or loose. Make sure that handrails are as long as the stairways. Check any carpeting to make sure that it is firmly attached to the stairs. Fix any carpet that is loose or worn. Avoid having throw rugs at the top or bottom of the stairs. If you do have throw rugs, attach them to the floor with carpet tape. Make sure that you have a light switch at the top of the stairs and the bottom of the stairs. If you do not have them, ask someone to add them for you. What else can I do to help prevent  falls? Wear shoes that: Do not have high heels. Have rubber bottoms. Are comfortable and fit you well. Are closed at the toe. Do not wear sandals. If you use a stepladder: Make sure that it is fully opened. Do not climb a closed stepladder. Make sure that both sides of the stepladder are locked into place. Ask someone to hold it for you, if possible. Clearly mark and make sure that you can see: Any grab bars or handrails. First and last steps. Where the edge of each step is. Use tools that help you move around (mobility aids) if they are needed. These include: Canes. Walkers. Scooters. Crutches. Turn on the lights when you go into a dark area. Replace any light bulbs as soon as they burn out. Set up your furniture so you have a clear path. Avoid moving your furniture around. If any of your floors are uneven, fix them. If there are any pets around you, be aware of where they are. Review your medicines with your doctor. Some medicines can make you feel dizzy. This can increase your chance of falling. Ask your doctor what other things that you can do to help prevent falls. This information is not intended to replace advice given to you by your health care provider. Make sure you discuss any questions you have with your health care provider. Document Released: 12/09/2008 Document Revised: 07/21/2015 Document Reviewed: 03/19/2014 Elsevier Interactive Patient Education  2017 [employer].

## 2022-03-20 ENCOUNTER — Telehealth: Payer: Self-pay | Admitting: Physician Assistant

## 2022-03-20 NOTE — Telephone Encounter (Signed)
Please reach out to patient and see if she would like to get Trulicity at [place] or switch to a different agent.   Junius Argyle, PharmD, [place], CPP  Clinical Pharmacist Practitioner  [facility] [phone]

## 2022-03-20 NOTE — Telephone Encounter (Signed)
Pt stated pharmacy is backordered on the medication Dulaglutide (TRULICITY) 3 TF/5.7DU SOPN pt stated she will be using her last one today. Stated Cristie Hem has helped her with this before and wanted to ask if he could help her as well as bring this to Lindsay's attention.  Pt seeking clinical advice.

## 2022-04-02 ENCOUNTER — Other Ambulatory Visit: Payer: Self-pay | Admitting: Physician Assistant

## 2022-04-04 ENCOUNTER — Telehealth: Payer: Self-pay

## 2022-04-04 NOTE — Progress Notes (Signed)
Care Management & Coordination Services Pharmacy Team Pharmacy Assistant   Name: Joanna Bishop  MRN: [id number] DOB: 1955/04/06  Reason for Encounter: Medication Coordination and Delivery for [facility]  Contacted patient to discuss medications and coordinate delivery from [place]. Spoke with patient on 04/04/2022   Chart review: Recent office visits:  03/12/2022 Joanna Shaggy, LPN (PCP Clinical Support Visit) for Medicare Annual Wellness Exam- No medication changes noted, No orders placed  Recent consult visits:  None ID  Hospital visits:  None in previous 6 months  Medications: Outpatient Encounter Medications as of 04/04/2022  Medication Sig   acetaminophen (TYLENOL) 650 MG CR tablet Take 650 mg by mouth every 8 (eight) hours as needed for pain.   albuterol (VENTOLIN HFA) 108 (90 Base) MCG/ACT inhaler INHALE 1-2 PUFFS BY MOUTH into THE lungs every 4-6 hours AS NEEDED FOR WHEEZING AND/OR SHORTNESS OF BREATH   baclofen (LIORESAL) 10 MG tablet Take 1 tablet (10 mg total) by mouth 2 (two) times daily as needed for muscle spasms. (Patient not taking: Reported on 03/12/2022)   blood glucose meter kit and supplies Dispense based on patient and insurance preference. Use daily as directed. 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   budesonide-formoterol (SYMBICORT) 80-4.5 MCG/ACT inhaler Inhale 2 puffs into the lungs 2 (two) times daily.   Dulaglutide (TRULICITY) 3 0000000 SOPN Inject 3 mg as directed once a week. For Diabetes.   glipiZIDE (GLUCOTROL XL) 10 MG 24 hr tablet TAKE ONE TABLET BY MOUTH EVERY MORNING   glucose blood (ONETOUCH VERIO) test strip Use to check blood sugars once daily as instructed   losartan (COZAAR) 100 MG tablet Take 1 tablet (100 mg total) by mouth daily.   montelukast (SINGULAIR) 10 MG tablet TAKE ONE TABLET BY MOUTH EVERYDAY AT BEDTIME   Multiple Vitamin (MULTIVITAMIN WITH MINERALS) TABS tablet Take 1 tablet by mouth daily. Centrum Silver Women's 50+   NON FORMULARY  Take 1 capsule by mouth in the morning and at bedtime. AZO Bladder Control Supplement   OneTouch Delica Lancets 99991111 MISC Use to check blood sugars once daily as directed.   oxybutynin (DITROPAN-XL) 10 MG 24 hr tablet TAKE ONE TABLET BY MOUTH EVERYDAY AT BEDTIME   propranolol ER (INDERAL LA) 60 MG 24 hr capsule TAKE ONE CAPSULE BY MOUTH ONCE DAILY   Rimegepant Sulfate (NURTEC) 75 MG TBDP Take 1 mg by mouth daily as needed. At onset of migraine/aura   rosuvastatin (CRESTOR) 5 MG tablet TAKE ONE TABLET BY MOUTH ONCE DAILY   traMADol (ULTRAM) 50 MG tablet Take 1 tablet (50 mg total) by mouth every 6 (six) hours as needed. (Patient not taking: Reported on 03/12/2022)   [DISCONTINUED] XARELTO 10 MG TABS tablet Take 10 mg by mouth every morning.   No facility-administered encounter medications on file as of 04/04/2022.   BP Readings from Last 3 Encounters:  09/06/21 132/86  06/14/21 120/70  05/12/21 138/83    Pulse Readings from Last 3 Encounters:  09/06/21 (!) 108  06/14/21 (!) 111  05/12/21 80    Lab Results  Component Value Date/Time   HGBA1C 6.0 (A) 09/06/2021 09:45 AM   HGBA1C 13.8 (A) 01/12/2021 01:33 PM   HGBA1C 8.8 (H) 03/12/2018 09:32 AM   HGBA1C 6.4 (H) 12/20/2017 09:24 AM   Lab Results  Component Value Date   CREATININE 1.17 (H) 09/06/2021   BUN 23 09/06/2021   GFRNONAA 60 (L) 03/09/2021   GFRAA >60 06/25/2019   NA 137 09/06/2021   K 4.5 09/06/2021  CALCIUM 9.5 09/06/2021   CO2 19 (L) 09/06/2021   Cycle dispensing form sent to Clarita Leber, [facility] for review.   Last adherence delivery date: 03/16/2022      Patient is due for next adherence delivery on: 04/13/2022 2nd Route  This delivery to include: Vials  30 Days  Losartan 50 mg 1 tablet daily (Breakfast) Glipizide 10 mg 1 tablet daily (Breakfast) Montelukast 10 mg 1 tablet daily (Bedtime) Rosuvastatin 5 mg 1 tablet daily (Breakfast) Oxybutynin 10 mg 1 tablet daily (Bedtime) Propanolol ER 60 mg daily   Patient  declined the following medications this month: OneTouch Strips - Adequate supply OneTouch Lancets- Adequate supply  Albuterol 108 Inhaler inhale 1-2 puffs every every 4-6 hours prn for wheezing or shortness of breath  Nurtec 75 mg 1 tablet as needed at onset of migraine-Patient receives this medication through [facility] Trulicity Inject 1.5 mg into the skin once a week- Patient is getting this medication through [facility] Symbicort 80-4.5 mcg Inhaler Inhale 2 puffs twice daily-Patient will be getting this medication from [facility]  No refill request needed.  Confirmed delivery date of 04/13/2022 2nd Route, advised patient that pharmacy will contact them the morning of delivery.  Any concerns about your medications? No  How often do you forget or accidentally miss a dose? Never  Do you use a pillbox? No  Is patient in packaging No  Lynann Bologna, [place] Pharmacist Assistant Phone: [phone] '

## 2022-04-27 ENCOUNTER — Ambulatory Visit: Payer: Medicare HMO

## 2022-04-27 DIAGNOSIS — N1831 Chronic kidney disease, stage 3a: Secondary | ICD-10-CM

## 2022-04-27 DIAGNOSIS — E1159 Type 2 diabetes mellitus with other circulatory complications: Secondary | ICD-10-CM

## 2022-04-27 NOTE — Progress Notes (Signed)
Care Management & Coordination Services Pharmacy Note  04/27/2022 Name:  Joanna Bishop MRN:  [id number] DOB:  [birth year]  Summary: Patient presents for follow-up consult.   -Blood sugars significantly elevated over the past 4 weeks. Patient cannot recall and significant changes that may have contributed to her elevated readings. She did have Covid 6 weeks ago, so that may be contributing to her current hyperglycemia.   -Blood pressures still mildly elevated today.   Recommendations/Changes made from today's visit: -Patient to focus on stringent lifestyle modifications (minimizing carbohydrate intake, increasing physical activity).   Follow up plan: -Patient to schedule PCP physical for next week.  -HC to check on BG in 1-2 weeks to see if trends improve.  -CPP follow-up one month  Subjective: Joanna Bishop is an 67 y.o. year old female who is a primary patient of [name], [name], [place].  The care coordination team was consulted for assistance with disease management and care coordination needs.    Engaged with patient by telephone for follow up visit.  Recent office visits: 09/06/21: Patient presented to Juleen China, PA-C for follow-up. A1c 6.0%. LDL 49. Microalbuminuria   Recent consult visits: None in previous 6 months  Hospital visits: None in previous 6 months  Objective:  Lab Results  Component Value Date   CREATININE 1.17 (H) 09/06/2021   BUN 23 09/06/2021   EGFR 52 (L) 09/06/2021   GFRNONAA 60 (L) 03/09/2021   GFRAA >60 06/25/2019   NA 137 09/06/2021   K 4.5 09/06/2021   CALCIUM 9.5 09/06/2021   CO2 19 (L) 09/06/2021   GLUCOSE 159 (H) 09/06/2021    Lab Results  Component Value Date/Time   HGBA1C 6.0 (A) 09/06/2021 09:45 AM   HGBA1C 13.8 (A) 01/12/2021 01:33 PM   HGBA1C 8.8 (H) 03/12/2018 09:32 AM   HGBA1C 6.4 (H) 12/20/2017 09:24 AM    Last diabetic Eye exam:  Lab Results  Component Value Date/Time   HMDIABEYEEXA No Retinopathy 04/04/2020 12:00 AM     Last diabetic Foot exam: No results found for: "HMDIABFOOTEX"   Lab Results  Component Value Date   CHOL 129 09/06/2021   HDL 36 (L) 09/06/2021   LDLCALC 49 09/06/2021   TRIG 288 (H) 09/06/2021   CHOLHDL 3.6 09/06/2021       Latest Ref Rng & Units 09/06/2021   10:13 AM 03/09/2021    6:49 PM 01/26/2021   10:18 AM  Hepatic Function  Total Protein 6.0 - 8.5 g/dL 6.7  7.4  6.5   Albumin 3.9 - 4.9 g/dL 4.3  4.5  4.5   AST 0 - 40 IU/L '21  30  30   '$ ALT 0 - 32 IU/L 25  33  38   Alk Phosphatase 44 - 121 IU/L 84  90  141   Total Bilirubin 0.0 - 1.2 mg/dL 0.7  1.2  0.7     Lab Results  Component Value Date/Time   TSH 2.510 10/14/2015 11:41 AM   TSH 3.030 07/12/2015 07:23 PM   TSH 1.730 09/13/2014 10:26 AM       Latest Ref Rng & Units 09/06/2021   10:13 AM 05/02/2021    2:33 PM 03/09/2021    6:49 PM  CBC  WBC 3.4 - 10.8 x10E3/uL 11.0  11.8  11.4   Hemoglobin 11.1 - 15.9 g/dL 13.1  13.8  13.5   Hematocrit 34.0 - 46.6 % 39.6  41.1  39.2   Platelets 150 - 450 x10E3/uL 243  216  257  Lab Results  Component Value Date/Time   VD25OH 29.0 (L) 10/14/2015 11:41 AM    Clinical ASCVD: No  The ASCVD Risk score (Arnett DK, et al., 2019) failed to calculate for the following reasons:   The valid total cholesterol range is 130 to 320 mg/dL       03/12/2022    9:00 AM 09/06/2021    9:04 AM 05/02/2021    1:28 PM  Depression screen PHQ 2/9  Decreased Interest 0 1 3  Down, Depressed, Hopeless 0 1 3  PHQ - 2 Score 0 2 6  Altered sleeping 0 1 3  Tired, decreased energy 0 1 3  Change in appetite 0 1 3  Feeling bad or failure about yourself  0 1 3  Trouble concentrating 0 1 3  Moving slowly or fidgety/restless 0 0 3  Suicidal thoughts 0 0 1  PHQ-9 Score 0 7 25  Difficult doing work/chores Not difficult at all Somewhat difficult Extremely dIfficult     Social History   Tobacco Use  Smoking Status Never  Smokeless Tobacco Never   BP Readings from Last 3 Encounters:  09/06/21  132/86  06/14/21 120/70  05/12/21 138/83   Pulse Readings from Last 3 Encounters:  09/06/21 (!) 108  06/14/21 (!) 111  05/12/21 80   Wt Readings from Last 3 Encounters:  03/12/22 213 lb (96.6 kg)  09/06/21 217 lb 3.2 oz (98.5 kg)  06/14/21 213 lb 3.2 oz (96.7 kg)   BMI Readings from Last 3 Encounters:  03/12/22 36.56 kg/m  09/06/21 37.28 kg/m  06/14/21 36.60 kg/m    Allergies  Allergen Reactions   Penicillins Swelling    Medications Reviewed Today     Reviewed by Dionisio David, LPN (Licensed Practical Nurse) on 03/12/22 at [place] List Status: <None>   Medication Order Taking? Sig Documenting Provider Last Dose Status Informant  acetaminophen (TYLENOL) 650 MG CR tablet [id number] Yes Take 650 mg by mouth every 8 (eight) hours as needed for pain. [provider] Taking Active   albuterol (VENTOLIN HFA) 108 (90 Base) MCG/ACT inhaler [id number] Yes INHALE 1-2 PUFFS BY MOUTH into THE lungs every 4-6 hours AS NEEDED FOR WHEEZING AND/OR SHORTNESS OF BREATH [name], PA-C Taking Active   baclofen (LIORESAL) 10 MG tablet [id number] No Take 1 tablet (10 mg total) by mouth 2 (two) times daily as needed for muscle spasms.  Patient not taking: Reported on 03/12/2022   Gretta Cool Not Taking Active   blood glucose meter kit and supplies [id number] Yes Dispense based on patient and insurance preference. Use daily as directed. 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 Virginia Crews, MD Taking Active   budesonide-formoterol [facility]) 80-4.5 MCG/ACT inhaler [id number] Yes Inhale 2 puffs into the lungs 2 (two) times daily. [name], PA-C Taking Active   Dulaglutide (TRULICITY) 3 0000000 SOPN [id number] Yes Inject 3 mg as directed once a week. For Diabetes. [name], PA-C Taking Active   glipiZIDE (GLUCOTROL XL) 10 MG 24 hr tablet [id number] Yes TAKE ONE TABLET BY MOUTH EVERY MORNING [name], PA-C Taking Active   glucose blood (ONETOUCH VERIO) test strip  [id number] Yes Use to check blood sugars once daily as instructed Virginia Crews, MD Taking Active   losartan (COZAAR) 100 MG tablet [id number] Yes Take 1 tablet (100 mg total) by mouth daily. [name], PA-C Taking Active   montelukast (SINGULAIR) 10 MG tablet [id number] Yes TAKE ONE TABLET BY MOUTH EVERYDAY AT BEDTIME [name], PA-C  Taking Active   Multiple Vitamin (MULTIVITAMIN WITH MINERALS) TABS tablet [id number] Yes Take 1 tablet by mouth daily. Centrum Silver Women's 50+ [provider] Taking Active   NON FORMULARY [id number] Yes Take 1 capsule by mouth in the morning and at bedtime. AZO Bladder Control Supplement [provider] Taking Active   OneTouch Delica Lancets [id number] MISC [id number] Yes Use to check blood sugars once daily as directed. Virginia Crews, MD Taking Active   oxybutynin (DITROPAN-XL) 10 MG 24 hr tablet [id number] Yes TAKE ONE TABLET BY MOUTH EVERYDAY AT BEDTIME [name], PA-C Taking Active   propranolol ER (INDERAL LA) 60 MG 24 hr capsule [id number] Yes TAKE ONE CAPSULE BY MOUTH ONCE DAILY [name], PA-C Taking Active   Rimegepant Sulfate (NURTEC) 75 MG TBDP [id number] Yes Take 1 mg by mouth daily as needed. At onset of Rebekah Chesterfield, PA-C Taking Active   rosuvastatin (CRESTOR) 5 MG tablet [id number] Yes TAKE ONE TABLET BY MOUTH ONCE DAILY [name], PA-C Taking Active   traMADol (ULTRAM) 50 MG tablet [id number] No Take 1 tablet (50 mg total) by mouth every 6 (six) hours as needed.  Patient not taking: Reported on 03/12/2022   [name], PA-C Not Taking Active     Discontinued 06/25/19 1840             SDOH:  (Social Determinants of Health) assessments and interventions performed: Yes SDOH Interventions    Flowsheet Row Clinical Support from 03/12/2022 in [place] [facility] from 05/15/2021 in [place] [facility]  from 03/20/2021 in [place] from 03/08/2021 in [place] [facility] from 02/21/2021 in [place] [facility] from 01/23/2021 in [place] Interventions        Food Insecurity Interventions Intervention Not Indicated -- -- Intervention Not Indicated -- --  Housing Interventions Intervention Not Indicated -- -- -- -- --  Transportation Interventions Intervention Not Indicated -- -- Intervention Not Indicated -- --  Utilities Interventions Intervention Not Indicated -- -- -- -- --  Alcohol Usage Interventions Intervention Not Indicated (Score <7) -- -- -- -- --  Financial Strain Interventions Intervention Not Indicated Intervention Not Indicated Intervention Not Indicated Intervention Not Indicated Intervention Not Indicated Other (Comment)  [PAP]  Physical Activity Interventions Intervention Not Indicated -- -- Intervention Not Indicated -- --  Stress Interventions Intervention Not Indicated -- -- Intervention Not Indicated -- --  Social Connections Interventions Intervention Not Indicated -- -- Intervention Not Indicated -- --       Medication Assistance: None required.  Patient affirms current coverage meets needs.  Medication Access: Within the past 30 days, how often has patient missed a dose of medication? None Is a pillbox or other method used to improve adherence? Yes  Factors that may affect medication adherence? no barriers identified Are meds synced by current pharmacy? Yes  Are meds delivered by current pharmacy? Yes  Does patient experience delays in picking up medications due to transportation concerns? No   [facility] Reviewed: Is patient disadvantaged to use [facility]?: No  Current Rx insurance plan: Physicist, medical Name and location of Current pharmacy:  [facility] - [place], [place] -  [address] [place] [address] Phone: [phone] Fax: [phone]  [facility] [place], [place] [address]  [address] [place] [address] Phone: [phone] Fax: [phone]  Reason patient declined to change pharmacies: Patient is already actively enrolled with [facility]  Compliance/Adherence/Medication fill history: Care Gaps: Covid Vaccine  HIV Screening  Shingrix  Pneumonia  Foot Exam  Influenza   Star-Rating Drugs: Rosuvastatin 5 mg: LF 04/10/22 for 30-DS from [facility]   Losartan 100 mg: LF 04/10/22 for 30-DS from [facility]   Glipizide 10 mg: LF 04/10/22 for 30-DS from [place]  Assessment/Plan  Hypertension (BP goal <130/80) -Uncontrolled:  -Current treatment: Losartan 100 mg daily: Appropriate, Query effective  Propranolol ER 60 mg daily: Appropriate, Query effective   -Medications previously tried:  -Current home readings:  137/88 124/82 134/97 144/90 163/90  -Denies hypotensive/hypertensive symptoms -Continue current medications   Hyperlipidemia: (LDL goal < 70) -Controlled: Not addressed during this visit. -Current treatment: Rosuvastatin 5 mg daily (AM) -Medications previously tried: NA  -Recommended to continue current medication  Diabetes (A1c goal <7%) -Controlled -Current medications: Glipizide XL 10 mg daily: Appropriate, Query effective  Trulicity 3 mg weekly: Appropriate, Query effective -Medications previously tried: Metformin (sluggish, stomach pain), finerenone (cost)   -Current home glucose readings 30-day Average: 210  Fasting Bedtime      29-Feb 228 206  28-Feb 197 214  27-Feb 272 228  26-Feb 201   25-Feb 214   24-Feb 186   23-Feb 190   22-Feb 229   21-Feb 176       Average 210 216  -Reports hyperglycemia symptoms: blurry vision.  -Current meal patterns: Primarily drinks diet soda OR water +  flavor packets.   -Current exercise: Plan to starts silver group exercise classes once weekly and chair exercises.  -Blood sugars significantly elevated over the past 4 weeks. Patient cannot recall and significant changes that may have contributed to her elevated readings. She did have Covid 6 weeks ago, so that may be contributing to her current hyperglycemia.  -Patient to focus on stringent lifestyle modifications (minimizing carbohydrate intake, increasing physical activity).  -HC to check on BG in 1-2 weeks to see if trends improve.  -Continue current medications   Asthma (Goal: control symptoms and prevent exacerbations) -Improved -Current treatment  Ventolin HFA 1-2 puffs every 4-6 hours as needed Symbicort 80-4.5 2 puffs twice daily  Montelukast 10 mg daily  -Medications previously tried: NA  -Exacerbations requiring treatment in last 6 months: None In the past four weeks, has the patient had:  Daytime asthma symptoms more than twice weekly?   No Any instance of night waking due to asthma?   No Utilized albuterol reliever for symptoms more than twice weekly? No: 2-3 x in past month  Any activity limitation due to asthma?    Yes -Patient reports consistent use of maintenance inhaler.   Bipolar Disorder (Goal: Achieve symptom remission) -Not ideally controlled: Not addressed during this visit. -Current treatment: None  -Medications previously tried/failed: Divalproex, Benztropine, Wellbutrin, Citalopram, Sertraline  -Counseled on importance of keeping upcoming psychiatry appointment   Migraines (Goal: Prevent migraines) -Controlled -Current treatment  Propanolol ER 60 mg daily  Rimegepant 75 mg daily as needed: Appropriate, Query effective  -Medications previously tried: Divalproex, Excedrin (GI Bleed) -Continue current medications   Chronic Pain (Goal: Minimize symptoms) -Controlled: Not addressed during this visit. -Managed by Carlynn Spry, PA-C (Ortho) -Current  treatment  Baclofen 10 mg twice daily as needed  Boswellia extract 500 mg twice daily  Tramadol 50 mg q6h PRN  -Medications previously tried: NA -Requires rare use  of tramadol   -Recommended to continue current medication  Overactive Bladder (Goal: Minimize symptoms) -Controlled -Current treatment  Oxybutynin XL 10 mg daily  -Medications previously tried: NA -Excellent symptomatic control.   -Recommended to continue current medication  Junius Argyle, PharmD, [place], [place] Pharmacist Practitioner  [facility] [phone]

## 2022-05-02 ENCOUNTER — Encounter: Payer: Self-pay | Admitting: Physician Assistant

## 2022-05-02 ENCOUNTER — Ambulatory Visit (INDEPENDENT_AMBULATORY_CARE_PROVIDER_SITE_OTHER): Payer: Medicare HMO | Admitting: Physician Assistant

## 2022-05-02 VITALS — BP 138/88 | HR 103 | Wt 236.9 lb

## 2022-05-02 DIAGNOSIS — G43109 Migraine with aura, not intractable, without status migrainosus: Secondary | ICD-10-CM | POA: Diagnosis not present

## 2022-05-02 DIAGNOSIS — Z0000 Encounter for general adult medical examination without abnormal findings: Secondary | ICD-10-CM

## 2022-05-02 DIAGNOSIS — E1122 Type 2 diabetes mellitus with diabetic chronic kidney disease: Secondary | ICD-10-CM

## 2022-05-02 DIAGNOSIS — E1159 Type 2 diabetes mellitus with other circulatory complications: Secondary | ICD-10-CM | POA: Diagnosis not present

## 2022-05-02 DIAGNOSIS — N1831 Chronic kidney disease, stage 3a: Secondary | ICD-10-CM | POA: Diagnosis not present

## 2022-05-02 DIAGNOSIS — Z1211 Encounter for screening for malignant neoplasm of colon: Secondary | ICD-10-CM

## 2022-05-02 DIAGNOSIS — J4540 Moderate persistent asthma, uncomplicated: Secondary | ICD-10-CM | POA: Diagnosis not present

## 2022-05-02 DIAGNOSIS — R928 Other abnormal and inconclusive findings on diagnostic imaging of breast: Secondary | ICD-10-CM | POA: Diagnosis not present

## 2022-05-02 DIAGNOSIS — Z23 Encounter for immunization: Secondary | ICD-10-CM | POA: Diagnosis not present

## 2022-05-02 DIAGNOSIS — R809 Proteinuria, unspecified: Secondary | ICD-10-CM | POA: Diagnosis not present

## 2022-05-02 DIAGNOSIS — E1129 Type 2 diabetes mellitus with other diabetic kidney complication: Secondary | ICD-10-CM | POA: Diagnosis not present

## 2022-05-02 DIAGNOSIS — I152 Hypertension secondary to endocrine disorders: Secondary | ICD-10-CM

## 2022-05-02 NOTE — Assessment & Plan Note (Signed)
Well controlled today Continue medication Ordered cmp F/u 109mo

## 2022-05-02 NOTE — Patient Instructions (Signed)
Please contact [phone] to schedule your mammogram. You will be asked your location preference to have procedure performed. You have two options listed below.  1) [facility] located at [place], [place] [address] 2) [facility] located at [address] [place], [place] [address]  Upon results being received our office will contact you. As well as all results can be viewed through your MyChart. Please feel free to contact us if you have any further questions or concerns.

## 2022-05-02 NOTE — Assessment & Plan Note (Signed)
Repeat uacr  Pt on arb

## 2022-05-02 NOTE — Progress Notes (Signed)
I,[name],acting as a Education administrator for [employer], PA-C.,have documented all relevant documentation on the behalf of [name], PA-C,as directed by  [name], PA-C while in the presence of [name], PA-C.   Complete physical exam   Patient: Joanna Bishop   DOB: [birth year]   67 y.o. Female  MRN: [id number] Visit Date: 05/02/2022  Today's healthcare provider: [name], PA-C   Cc. cpe  Subjective    Joanna Bishop is a 67 y.o. female who presents today for a complete physical exam.  She reports consuming a general diet.  The patient reports that she does chair exercises three times a week for 15 minutes.  She generally feels well. She reports sleeping fairly well. She does not have additional problems to discuss today.   Migraines -Pt reports less frequent since starting propranolol, but still experiences 2-3  a week. Now around mid-day. Reports nurtec did not help. She sits in a dark room and takes tylenol. She reports working more on her computer which she thinks triggers them.  Diabetes -pt reports her BS have been > 200 lately which is unusual. Denies changing anything in her diet.   Past Medical History:  Diagnosis Date   Allergy 02/26/1958   Anxiety    Arthritis    shoulders, legs   Asthma    Blood transfusion without reported diagnosis 05/05/2021   Depression    Diabetes mellitus without complication ([place])    Hyperlipidemia    Migraine headache    2x/month   Osteoporosis 11/27/2010   Vertigo    Past Surgical History:  Procedure Laterality Date   ABDOMINAL HYSTERECTOMY     ARTHROPLASTY     CHOLECYSTECTOMY     FEMUR FRACTURE SURGERY     JOINT REPLACEMENT  12/16/2010   TOTAL KNEE ARTHROPLASTY Left    TUBAL LIGATION  10/08/1986   Social History   Socioeconomic History   Marital status: Widowed    Spouse name: Not on file   Number of children: 3   Years of education: Not on file   Highest education level: Some college, no degree   Occupational History   Occupation: disability  Tobacco Use   Smoking status: Never   Smokeless tobacco: Never  Vaping Use   Vaping Use: Never used  Substance and Sexual Activity   Alcohol use: Not Currently    Comment: Maybe 6 beers a year   Drug use: Never   Sexual activity: Not Currently    Birth control/protection: Abstinence  Other Topics Concern   Not on file  Social History Narrative   Not on file   Social Determinants of Health   Financial Resource Strain: Medium Risk (03/12/2022)   Overall Financial Resource Strain (CARDIA)    Difficulty of Paying Living Expenses: Somewhat hard  Food Insecurity: No Food Insecurity (03/12/2022)   Hunger Vital Sign    Worried About Running Out of Food in the Last Year: Never true    Ran Out of Food in the Last Year: Never true  Transportation Needs: No Transportation Needs (03/12/2022)   PRAPARE - Hydrologist (Medical): No    Lack of Transportation (Non-Medical): No  Physical Activity: Insufficiently Active (03/12/2022)   Exercise Vital Sign    Days of Exercise per Week: 2 days    Minutes of Exercise per Session: 20 min  Stress: No Stress Concern Present (03/12/2022)   [place]  Feeling of Stress : Not at all  Social Connections: Socially Isolated (03/12/2022)   Social Connection and Isolation Panel [NHANES]    Frequency of Communication with Friends and Family: More than three times a week    Frequency of Social Gatherings with Friends and Family: Once a week    Attends Religious Services: Never    Marine scientist or Organizations: No    Attends Archivist Meetings: Never    Marital Status: Widowed  Intimate Partner Violence: Not At Risk (03/12/2022)   Humiliation, Afraid, Rape, and Kick questionnaire    Fear of Current or Ex-Partner: No    Emotionally Abused: No    Physically Abused: No    Sexually Abused: No    Family Status  Relation Name Status   Mother Lannette Donath Deceased   Father Patrick Jupiter Deceased   MGM  Deceased   PGM Unknown Deceased   Family History  Problem Relation Age of Onset   Diabetes Mother    COPD Mother    Diabetes Father    Coronary artery disease Father    Coronary artery disease Maternal Grandmother    Heart attack Maternal Grandmother    Diabetes Paternal Grandmother    Allergies  Allergen Reactions   Penicillins Swelling, Anaphylaxis and Other (See Comments)    Patient Care Team: [name], PA-C as PCP - General (Physician Assistant) Ocie Doyne, [place] (Optometry) Germaine Pomfret, [facility] (Pharmacist)   Medications: Outpatient Medications Prior to Visit  Medication Sig   acetaminophen (TYLENOL) 650 MG CR tablet Take 650 mg by mouth every 8 (eight) hours as needed for pain.   albuterol (VENTOLIN HFA) 108 (90 Base) MCG/ACT inhaler INHALE 1-2 PUFFS BY MOUTH into THE lungs every 4-6 hours AS NEEDED FOR WHEEZING AND/OR SHORTNESS OF BREATH   blood glucose meter kit and supplies Dispense based on patient and insurance preference. Use daily as directed. 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   budesonide-formoterol (SYMBICORT) 80-4.5 MCG/ACT inhaler Inhale 2 puffs into the lungs 2 (two) times daily.   Dulaglutide (TRULICITY) 3 0000000 SOPN Inject 3 mg as directed once a week. For Diabetes.   glipiZIDE (GLUCOTROL XL) 10 MG 24 hr tablet TAKE ONE TABLET BY MOUTH EVERY MORNING   glucose blood (ONETOUCH VERIO) test strip Use to check blood sugars once daily as instructed   losartan (COZAAR) 100 MG tablet Take 1 tablet (100 mg total) by mouth daily.   montelukast (SINGULAIR) 10 MG tablet TAKE ONE TABLET BY MOUTH EVERYDAY AT BEDTIME   Multiple Vitamin (MULTIVITAMIN WITH MINERALS) TABS tablet Take 1 tablet by mouth daily. Centrum Silver Women's 50+   NON FORMULARY Take 1 capsule by mouth in the morning and at bedtime. AZO Bladder Control Supplement   OneTouch Delica Lancets 99991111 MISC Use to  check blood sugars once daily as directed.   oxybutynin (DITROPAN-XL) 10 MG 24 hr tablet TAKE ONE TABLET BY MOUTH EVERYDAY AT BEDTIME   propranolol ER (INDERAL LA) 60 MG 24 hr capsule TAKE ONE CAPSULE BY MOUTH ONCE DAILY   Rimegepant Sulfate (NURTEC) 75 MG TBDP Take 1 mg by mouth daily as needed. At onset of migraine/aura   rosuvastatin (CRESTOR) 5 MG tablet TAKE ONE TABLET BY MOUTH ONCE DAILY   [DISCONTINUED] baclofen (LIORESAL) 10 MG tablet Take 1 tablet (10 mg total) by mouth 2 (two) times daily as needed for muscle spasms. (Patient not taking: Reported on 03/12/2022)   [DISCONTINUED] traMADol (ULTRAM) 50 MG tablet Take 1 tablet (50 mg total) by mouth  every 6 (six) hours as needed. (Patient not taking: Reported on 03/12/2022)   No facility-administered medications prior to visit.    Review of Systems  Eyes:  Positive for visual disturbance.  Respiratory:  Positive for cough, chest tightness and shortness of breath.   Gastrointestinal:  Positive for abdominal pain.  Musculoskeletal:  Positive for arthralgias, back pain, neck pain and neck stiffness.  Neurological:  Positive for light-headedness and headaches.  Psychiatric/Behavioral:  Positive for confusion and decreased concentration.      Objective    BP 138/88 (BP Location: Left Arm, Patient Position: Sitting, Cuff Size: Large)   Pulse (!) 103   Wt 236 lb 14.4 oz (107.5 kg)   SpO2 97%   BMI 40.66 kg/m    Physical Exam Constitutional:      General: She is awake.     Appearance: She is well-developed. She is not ill-appearing.  HENT:     Head: Normocephalic.     Right Ear: Tympanic membrane normal.     Left Ear: Tympanic membrane normal.     Nose: Nose normal. No congestion or rhinorrhea.     Mouth/Throat:     Pharynx: No oropharyngeal exudate or posterior oropharyngeal erythema.  Eyes:     Conjunctiva/sclera: Conjunctivae normal.     Pupils: Pupils are equal, round, and reactive to light.  Neck:     Thyroid: No thyroid  mass or thyromegaly.  Cardiovascular:     Rate and Rhythm: Normal rate and regular rhythm.     Pulses:          Dorsalis pedis pulses are 3+ on the right side and 3+ on the left side.       Posterior tibial pulses are 3+ on the left side.     Heart sounds: Normal heart sounds.  Pulmonary:     Effort: Pulmonary effort is normal.     Breath sounds: Normal breath sounds.  Abdominal:     Palpations: Abdomen is soft.     Tenderness: There is no abdominal tenderness.  Musculoskeletal:     Right lower leg: No swelling. No edema.     Left lower leg: No swelling. No edema.  Feet:     Right foot:     Protective Sensation: 4 sites tested.  4 sites sensed.     Skin integrity: Skin integrity normal.     Toenail Condition: Right toenails are normal.     Left foot:     Protective Sensation: 4 sites tested.  4 sites sensed.     Skin integrity: Skin integrity normal.     Toenail Condition: Left toenails are normal.     Comments: Left great toe with sensation but diminished per pt Lymphadenopathy:     Cervical: No cervical adenopathy.  Skin:    General: Skin is warm.  Neurological:     Mental Status: She is alert and oriented to person, place, and time.  Psychiatric:        Attention and Perception: Attention normal.        Mood and Affect: Mood normal.        Speech: Speech normal.        Behavior: Behavior normal. Behavior is cooperative.     Last depression screening scores    05/02/2022   10:43 AM 03/12/2022    9:00 AM 09/06/2021    9:04 AM  PHQ 2/9 Scores  PHQ - 2 Score 2 0 2  PHQ- 9 Score 6 0 7  Last fall risk screening    05/02/2022   10:43 AM  Duck in the past year? 0  Number falls in past yr: 0  Injury with Fall? 0  Risk for fall due to : No Fall Risks  Follow up Falls evaluation completed   Last Audit-C alcohol use screening    05/02/2022   10:43 AM  Alcohol Use Disorder Test (AUDIT)  1. How often do you have a drink containing alcohol? 1  2. How many  drinks containing alcohol do you have on a typical day when you are drinking? 0  3. How often do you have six or more drinks on one occasion? 0  AUDIT-C Score 1   A score of 3 or more in women, and 4 or more in men indicates increased risk for alcohol abuse, EXCEPT if all of the points are from question 1   No results found for any visits on 05/02/22.  Assessment & Plan    Routine Health Maintenance and Physical Exam  Exercise Activities and Dietary recommendations --balanced diet high in fiber and protein, low in sugars, carbs, fats. --physical activity/exercise 30 minutes 3-5 times a week     Immunization History  Administered Date(s) Administered   Influenza,inj,Quad PF,6+ Mos 01/06/2018, 12/12/2018, 05/02/2021, 05/02/2022   Pneumococcal Polysaccharide-23 07/30/2012   Tdap 07/28/2009    Health Maintenance  Topic Date Due   COVID-19 Vaccine (1) Never done   HIV Screening  Never done   Zoster Vaccines- Shingrix (1 of 2) Never done   Fecal DNA (Cologuard)  Never done   FOOT EXAM  01/07/2019   DTaP/Tdap/Td (2 - Td or Tdap) 07/29/2019   OPHTHALMOLOGY EXAM  04/04/2021   MAMMOGRAM  02/22/2022   HEMOGLOBIN A1C  03/09/2022   Diabetic kidney evaluation - eGFR measurement  09/07/2022   Diabetic kidney evaluation - Urine ACR  09/07/2022   Medicare Annual Wellness (AWV)  03/13/2023   PAP SMEAR-Modifier  07/07/2023   INFLUENZA VACCINE  Completed   Hepatitis C Screening  Completed   HPV VACCINES  Aged Out   COLONOSCOPY (Pts 45-42yr Insurance coverage will need to be confirmed)  Discontinued    Discussed health benefits of physical activity, and encouraged her to engage in regular exercise appropriate for her age and condition.  Problem List Items Addressed This Visit       Cardiovascular and Mediastinum   Hypertension associated with diabetes ([facility]    Well controlled today Continue medication Ordered cmp F/u 675mo    Migraine with aura and without status migrainosus, not  intractable    No improvement with nurtec Minimal improvement with propranolol Ref to neurology      Relevant Orders   Ambulatory referral to Neurology     Respiratory   Airway hyperreactivity    Stable with symbicort        Endocrine   Type 2 diabetes mellitus with stage 3a chronic kidney disease, without long-term current use of insulin (HCC)    Last A1c 6.0%  Repeat today  On statin, arb. Managed with trulicity 3 mg, glipizide 10 mg Foot exam today F/u 3- 6 mo pending a1c      Relevant Orders   Comprehensive Metabolic Panel (CMET)   CBC w/Diff/Platelet   HgB A1c   Microalbuminuria due to type 2 diabetes mellitus (HCC)    Repeat uacr  Pt on arb      Relevant Orders   Urine Microalbumin w/creat. ratio  Other Visit Diagnoses     Annual physical exam    -  Primary   Colon cancer screening       Relevant Orders   Ambulatory referral to Gastroenterology   Abnormal mammogram       Relevant Orders   MM 3D DIAGNOSTIC MAMMOGRAM BILATERAL BREAST   Need for immunization against influenza       Relevant Orders   Flu Vaccine QUAD 12moIM (Fluarix, Fluzone & Alfiuria Quad PF) (Completed)       Return in about 3 months (around 08/02/2022) for DMII.     I, LMikey Kirschner PA-C have reviewed all documentation for this visit. The documentation on  05/02/22 for the exam, diagnosis, procedures, and orders are all accurate and complete.  LMikey Kirschner PA-C [facility] [place] [address]Office: [phone]: [phone]

## 2022-05-02 NOTE — Assessment & Plan Note (Signed)
Stable with symbicort

## 2022-05-02 NOTE — Assessment & Plan Note (Signed)
Last A1c 6.0%  Repeat today  On statin, arb. Managed with trulicity 3 mg, glipizide 10 mg Foot exam today F/u 3- 6 mo pending a1c

## 2022-05-02 NOTE — Assessment & Plan Note (Signed)
No improvement with nurtec Minimal improvement with propranolol Ref to neurology

## 2022-05-03 ENCOUNTER — Other Ambulatory Visit: Payer: Self-pay

## 2022-05-03 ENCOUNTER — Telehealth: Payer: Self-pay

## 2022-05-03 DIAGNOSIS — Z1211 Encounter for screening for malignant neoplasm of colon: Secondary | ICD-10-CM

## 2022-05-03 MED ORDER — NA SULFATE-K SULFATE-MG SULF 17.5-3.13-1.6 GM/177ML PO SOLN: 1.0000 | Freq: Once | ORAL | 0 refills | Status: AC

## 2022-05-03 NOTE — Telephone Encounter (Signed)
Gastroenterology Pre-Procedure Review  Request Date: 05/31/22 Requesting Physician: Dr. Allen Norris  PATIENT REVIEW QUESTIONS: The patient responded to the following health history questions as indicated:    1. Are you having any GI issues? no 2. Do you have a personal history of Polyps? no 3. Do you have a family history of Colon Cancer or Polyps? yes (grandfather colon cancer) 4. Diabetes Mellitus? yes (patient takes trulicity injections on Tuesdays has been advised to stop injections 7 days prior to colonoscopy.  Also takes glipizide.  Has been advised to stop 1 day prior to colonoscopy) 5. Joint replacements in the past 12 months?no 6. Major health problems in the past 3 months?no 7. Any artificial heart valves, MVP, or defibrillator?no    MEDICATIONS & ALLERGIES:    Patient reports the following regarding taking any anticoagulation/antiplatelet therapy:   Plavix, Coumadin, Eliquis, Xarelto, Lovenox, Pradaxa, Brilinta, or Effient? no Aspirin? no  Patient confirms/reports the following medications:  Current Outpatient Medications  Medication Sig Dispense Refill   acetaminophen (TYLENOL) 650 MG CR tablet Take 650 mg by mouth every 8 (eight) hours as needed for pain.     albuterol (VENTOLIN HFA) 108 (90 Base) MCG/ACT inhaler INHALE 1-2 PUFFS BY MOUTH into THE lungs every 4-6 hours AS NEEDED FOR WHEEZING AND/OR SHORTNESS OF BREATH 18 g 4   blood glucose meter kit and supplies Dispense based on patient and insurance preference. Use daily as directed. 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 1 each 0   budesonide-formoterol (SYMBICORT) 80-4.5 MCG/ACT inhaler Inhale 2 puffs into the lungs 2 (two) times daily. 3 each 3   Dulaglutide (TRULICITY) 3 0000000 SOPN Inject 3 mg as directed once a week. For Diabetes. 6 mL 3   glipiZIDE (GLUCOTROL XL) 10 MG 24 hr tablet TAKE ONE TABLET BY MOUTH EVERY MORNING 90 tablet 1   glucose blood (ONETOUCH VERIO) test strip Use to check blood sugars once daily as instructed 100 each 12    losartan (COZAAR) 100 MG tablet Take 1 tablet (100 mg total) by mouth daily. 90 tablet 1   montelukast (SINGULAIR) 10 MG tablet TAKE ONE TABLET BY MOUTH EVERYDAY AT BEDTIME 90 tablet 1   Multiple Vitamin (MULTIVITAMIN WITH MINERALS) TABS tablet Take 1 tablet by mouth daily. Centrum Silver Women's 50+     NON FORMULARY Take 1 capsule by mouth in the morning and at bedtime. AZO Bladder Control Supplement     OneTouch Delica Lancets 99991111 MISC Use to check blood sugars once daily as directed. 100 each 12   oxybutynin (DITROPAN-XL) 10 MG 24 hr tablet TAKE ONE TABLET BY MOUTH EVERYDAY AT BEDTIME 90 tablet 1   propranolol ER (INDERAL LA) 60 MG 24 hr capsule TAKE ONE CAPSULE BY MOUTH ONCE DAILY 90 capsule 1   Rimegepant Sulfate (NURTEC) 75 MG TBDP Take 1 mg by mouth daily as needed. At onset of migraine/aura 8 tablet 3   rosuvastatin (CRESTOR) 5 MG tablet TAKE ONE TABLET BY MOUTH ONCE DAILY 90 tablet 1   No current facility-administered medications for this visit.    Patient confirms/reports the following allergies:  Allergies  Allergen Reactions   Penicillins Swelling, Anaphylaxis and Other (See Comments)    No orders of the defined types were placed in this encounter.   AUTHORIZATION INFORMATION Primary Insurance: 1D#: Group #:  Secondary Insurance: 1D#: Group #:  SCHEDULE INFORMATION: Date: 05/31/22 Time: Location: [facility]

## 2022-05-04 ENCOUNTER — Telehealth: Payer: Self-pay

## 2022-05-04 NOTE — Progress Notes (Signed)
Care Management & Coordination Services Pharmacy Team Pharmacy Assistant   Name: Joanna Bishop  MRN: [id number] DOB: [birth year]  Reason for Encounter: Medication Coordination and Delivery for [facility]  Contacted patient to discuss medications and coordinate delivery from [place]. Spoke with patient on 05/04/2022   Chart review: Recent office visits:  05/02/2022 [name], PA-C (PCP Office Visit) for Annual Physical Exam-Stopped: Baclofen 10 mg, Tramadol 50 mg due to patient not taking, Lab orders placed, Mammogram ordered, Referral to Gastroenterology placed, Referral to Neurology placed,   Recent consult visits:  None ID  Hospital visits:  None in previous 6 months  Medications: Outpatient Encounter Medications as of 05/04/2022  Medication Sig   acetaminophen (TYLENOL) 650 MG CR tablet Take 650 mg by mouth every 8 (eight) hours as needed for pain.   albuterol (VENTOLIN HFA) 108 (90 Base) MCG/ACT inhaler INHALE 1-2 PUFFS BY MOUTH into THE lungs every 4-6 hours AS NEEDED FOR WHEEZING AND/OR SHORTNESS OF BREATH   blood glucose meter kit and supplies Dispense based on patient and insurance preference. Use daily as directed. 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   budesonide-formoterol (SYMBICORT) 80-4.5 MCG/ACT inhaler Inhale 2 puffs into the lungs 2 (two) times daily.   Dulaglutide (TRULICITY) 3 0000000 SOPN Inject 3 mg as directed once a week. For Diabetes.   glipiZIDE (GLUCOTROL XL) 10 MG 24 hr tablet TAKE ONE TABLET BY MOUTH EVERY MORNING   glucose blood (ONETOUCH VERIO) test strip Use to check blood sugars once daily as instructed   losartan (COZAAR) 100 MG tablet Take 1 tablet (100 mg total) by mouth daily.   montelukast (SINGULAIR) 10 MG tablet TAKE ONE TABLET BY MOUTH EVERYDAY AT BEDTIME   Multiple Vitamin (MULTIVITAMIN WITH MINERALS) TABS tablet Take 1 tablet by mouth daily. Centrum Silver Women's 50+   NON FORMULARY Take 1 capsule by mouth in the morning and at bedtime. AZO  Bladder Control Supplement   OneTouch Delica Lancets 99991111 MISC Use to check blood sugars once daily as directed.   oxybutynin (DITROPAN-XL) 10 MG 24 hr tablet TAKE ONE TABLET BY MOUTH EVERYDAY AT BEDTIME   propranolol ER (INDERAL LA) 60 MG 24 hr capsule TAKE ONE CAPSULE BY MOUTH ONCE DAILY   Rimegepant Sulfate (NURTEC) 75 MG TBDP Take 1 mg by mouth daily as needed. At onset of migraine/aura (Patient not taking: Reported on 05/03/2022)   rosuvastatin (CRESTOR) 5 MG tablet TAKE ONE TABLET BY MOUTH ONCE DAILY   No facility-administered encounter medications on file as of 05/04/2022.   BP Readings from Last 3 Encounters:  05/02/22 138/88  09/06/21 132/86  06/14/21 120/70    Pulse Readings from Last 3 Encounters:  05/02/22 (!) 103  09/06/21 (!) 108  06/14/21 (!) 111    Lab Results  Component Value Date/Time   HGBA1C 6.0 (A) 09/06/2021 09:45 AM   HGBA1C 13.8 (A) 01/12/2021 01:33 PM   HGBA1C 8.8 (H) 03/12/2018 09:32 AM   HGBA1C 6.4 (H) 12/20/2017 09:24 AM   Lab Results  Component Value Date   CREATININE 1.17 (H) 09/06/2021   BUN 23 09/06/2021   GFRNONAA 60 (L) 03/09/2021   GFRAA >60 06/25/2019   NA 137 09/06/2021   K 4.5 09/06/2021   CALCIUM 9.5 09/06/2021   CO2 19 (L) 09/06/2021   Cycle dispensing form sent to Clarita Leber, [facility] for review.   Last adherence delivery date: 04/13/2022      Patient is due for next adherence delivery on: 05/15/2022 [facility]  This delivery to  include: Vials  30 Days  Losartan 50 mg 1 tablet daily (Breakfast) Glipizide 10 mg 1 tablet daily (Breakfast) Montelukast 10 mg 1 tablet daily (Bedtime) Rosuvastatin 5 mg 1 tablet daily (Breakfast) Oxybutynin 10 mg 1 tablet daily (Bedtime) Propanolol ER 60 mg daily   Patient declined the following medications this month: OneTouch Strips - Adequate supply-Patient will need next month OneTouch Lancets- Adequate supply-Patient will need next month Albuterol 108 Inhaler inhale 1-2 puffs every every 4-6 hours prn  for wheezing or shortness of breath  Nurtec 75 mg 1 tablet as needed at onset of migraine-Patient receives this medication through [facility] Trulicity Inject 1.5 mg into the skin once a week- Patient is getting this medication through [facility] Symbicort 80-4.5 mcg Inhaler Inhale 2 puffs twice daily-Patient will be getting this medication from [facility]  No refill request needed.  Confirmed delivery date of 05/15/2022 [address], advised patient that pharmacy will contact them the morning of delivery.  Any concerns about your medications? No  How often do you forget or accidentally miss a dose? Never  Do you use a pillbox? No  Is patient in packaging No   Recent blood pressure readings are as follows: 138/88  Recent blood glucose readings are as follows: 210-278   I spoke to patient and she is doing well. Patient stated that her blood sugars have been elevated the last 4 weeks but she hasn't changed her routine. Per patient she has been doing everything the same but her blood sugars are spiking.   Patient denies any thing new with her diet, and no changes in her medications including OTC medications. Patient was given a Dexcom sample but is having issues with getting it to operate. I made an in person appointment with CPP for the patient so he can assist her with her Dexcom. I sent CPP a message informing him of her blood sugar, and informing him of the appointment. Patient has no other issues or concerns at this time.  Lynann Bologna, CPA/CMA Clinical Pharmacist Assistant Phone: [phone]

## 2022-05-06 DIAGNOSIS — R6520 Severe sepsis without septic shock: Secondary | ICD-10-CM | POA: Diagnosis not present

## 2022-05-06 DIAGNOSIS — Z20822 Contact with and (suspected) exposure to covid-19: Secondary | ICD-10-CM | POA: Diagnosis not present

## 2022-05-06 DIAGNOSIS — I129 Hypertensive chronic kidney disease with stage 1 through stage 4 chronic kidney disease, or unspecified chronic kidney disease: Secondary | ICD-10-CM | POA: Diagnosis not present

## 2022-05-06 DIAGNOSIS — N10 Acute tubulo-interstitial nephritis: Secondary | ICD-10-CM | POA: Diagnosis not present

## 2022-05-06 DIAGNOSIS — N3281 Overactive bladder: Secondary | ICD-10-CM | POA: Diagnosis not present

## 2022-05-06 DIAGNOSIS — R300 Dysuria: Secondary | ICD-10-CM | POA: Diagnosis not present

## 2022-05-06 DIAGNOSIS — R112 Nausea with vomiting, unspecified: Secondary | ICD-10-CM | POA: Diagnosis not present

## 2022-05-06 DIAGNOSIS — N390 Urinary tract infection, site not specified: Secondary | ICD-10-CM | POA: Diagnosis not present

## 2022-05-06 DIAGNOSIS — R509 Fever, unspecified: Secondary | ICD-10-CM | POA: Diagnosis not present

## 2022-05-06 DIAGNOSIS — J4540 Moderate persistent asthma, uncomplicated: Secondary | ICD-10-CM | POA: Diagnosis not present

## 2022-05-06 DIAGNOSIS — E785 Hyperlipidemia, unspecified: Secondary | ICD-10-CM | POA: Diagnosis not present

## 2022-05-06 DIAGNOSIS — N179 Acute kidney failure, unspecified: Secondary | ICD-10-CM | POA: Diagnosis not present

## 2022-05-06 DIAGNOSIS — E1122 Type 2 diabetes mellitus with diabetic chronic kidney disease: Secondary | ICD-10-CM | POA: Diagnosis not present

## 2022-05-06 DIAGNOSIS — I10 Essential (primary) hypertension: Secondary | ICD-10-CM | POA: Diagnosis not present

## 2022-05-06 DIAGNOSIS — A419 Sepsis, unspecified organism: Secondary | ICD-10-CM | POA: Diagnosis not present

## 2022-05-06 DIAGNOSIS — R9341 Abnormal radiologic findings on diagnostic imaging of renal pelvis, ureter, or bladder: Secondary | ICD-10-CM | POA: Diagnosis not present

## 2022-05-06 DIAGNOSIS — E860 Dehydration: Secondary | ICD-10-CM | POA: Diagnosis not present

## 2022-05-06 DIAGNOSIS — N1831 Chronic kidney disease, stage 3a: Secondary | ICD-10-CM | POA: Diagnosis not present

## 2022-05-06 DIAGNOSIS — B9620 Unspecified Escherichia coli [E. coli] as the cause of diseases classified elsewhere: Secondary | ICD-10-CM | POA: Diagnosis not present

## 2022-05-06 DIAGNOSIS — A4151 Sepsis due to Escherichia coli [E. coli]: Secondary | ICD-10-CM | POA: Diagnosis not present

## 2022-05-06 DIAGNOSIS — G43909 Migraine, unspecified, not intractable, without status migrainosus: Secondary | ICD-10-CM | POA: Diagnosis not present

## 2022-05-06 DIAGNOSIS — E1165 Type 2 diabetes mellitus with hyperglycemia: Secondary | ICD-10-CM | POA: Diagnosis not present

## 2022-05-08 ENCOUNTER — Telehealth: Payer: Self-pay

## 2022-05-08 NOTE — Telephone Encounter (Signed)
Copied from [phone]. Topic: General - Other >> May 08, 2022  8:17 AM Oley Balm A wrote: Reason for CRM: Pt has a 9:20am appt today with Cristie Hem and will not be able to make it due to still being in the Hospital.

## 2022-05-10 DIAGNOSIS — A4151 Sepsis due to Escherichia coli [E. coli]: Secondary | ICD-10-CM | POA: Diagnosis not present

## 2022-05-10 DIAGNOSIS — N1831 Chronic kidney disease, stage 3a: Secondary | ICD-10-CM | POA: Diagnosis not present

## 2022-05-10 DIAGNOSIS — E1122 Type 2 diabetes mellitus with diabetic chronic kidney disease: Secondary | ICD-10-CM | POA: Diagnosis not present

## 2022-05-10 DIAGNOSIS — N390 Urinary tract infection, site not specified: Secondary | ICD-10-CM | POA: Diagnosis not present

## 2022-05-10 DIAGNOSIS — N179 Acute kidney failure, unspecified: Secondary | ICD-10-CM | POA: Diagnosis not present

## 2022-05-10 DIAGNOSIS — E1165 Type 2 diabetes mellitus with hyperglycemia: Secondary | ICD-10-CM | POA: Diagnosis not present

## 2022-05-11 ENCOUNTER — Telehealth: Payer: Self-pay

## 2022-05-11 NOTE — Transitions of Care (Post Inpatient/ED Visit) (Signed)
   05/11/2022  Name: Joanna Bishop MRN: [id number] DOB: 1955/07/31  Today's TOC FU Call Status: Today's TOC FU Call Status:: Successful TOC FU Call Competed TOC FU Call Complete Date: 05/11/22  Transition Care Management Follow-up Telephone Call Date of Discharge: 05/10/22 Discharge Facility: Other ([facility]) Name of Other (Non[facility]) Discharge Facility: [facility] Type of Discharge: Inpatient Admission Primary Inpatient Discharge Diagnosis:: Sepsis How have you been since you were released from the hospital?: Better Any questions or concerns?: No  Items Reviewed: Did you receive and understand the discharge instructions provided?: Yes Medications obtained and verified?: Yes (Medications Reviewed) Any new allergies since your discharge?: No Dietary orders reviewed?: Yes Type of Diet Ordered:: Diabetic Do you have support at home?: Yes People in Home: child(ren), adult Name of Support/Comfort Primary Source: [facility] and Equipment/Supplies: [place] Ordered?: No Any new equipment or medical supplies ordered?: NA  Functional Questionnaire: Do you need assistance with bathing/showering or dressing?: No Do you need assistance with meal preparation?: No Do you need assistance with eating?: No Do you have difficulty maintaining continence: No Do you need assistance with getting out of bed/getting out of a chair/moving?: No Do you have difficulty managing or taking your medications?: No  Folllow up appointments reviewed: PCP Follow-up appointment confirmed?: Yes Date of PCP follow-up appointment?: 05/15/22 Follow-up Provider: [name], [facility] Specialist [facility] Follow-up appointment confirmed?: No Reason Specialist Follow-Up Not Confirmed: Patient has Specialist Provider Number and will Call for Appointment Do you need transportation to your follow-up appointment?: No Do you understand care options if your condition(s) worsen?: Yes-patient  verbalized understanding  SDOH Interventions Today    Flowsheet Row Most Recent Value  SDOH Interventions   Food Insecurity Interventions Intervention Not Indicated  Housing Interventions Intervention Not Indicated      Johnney Killian, RN, BSN, CCM Care Management Coordinator [facility] Phone: [phone]: [phone]

## 2022-05-15 ENCOUNTER — Encounter: Payer: Self-pay | Admitting: Physician Assistant

## 2022-05-15 ENCOUNTER — Ambulatory Visit (INDEPENDENT_AMBULATORY_CARE_PROVIDER_SITE_OTHER): Payer: Medicare HMO | Admitting: Physician Assistant

## 2022-05-15 VITALS — BP 138/75 | HR 84 | Wt 232.0 lb

## 2022-05-15 DIAGNOSIS — N1831 Chronic kidney disease, stage 3a: Secondary | ICD-10-CM | POA: Diagnosis not present

## 2022-05-15 DIAGNOSIS — B379 Candidiasis, unspecified: Secondary | ICD-10-CM

## 2022-05-15 DIAGNOSIS — A419 Sepsis, unspecified organism: Secondary | ICD-10-CM

## 2022-05-15 DIAGNOSIS — T3695XA Adverse effect of unspecified systemic antibiotic, initial encounter: Secondary | ICD-10-CM

## 2022-05-15 DIAGNOSIS — E1122 Type 2 diabetes mellitus with diabetic chronic kidney disease: Secondary | ICD-10-CM | POA: Diagnosis not present

## 2022-05-15 MED ORDER — FLUCONAZOLE 150 MG PO TABS: 150.0000 mg | ORAL_TABLET | Freq: Once | ORAL | 0 refills | Status: AC

## 2022-05-15 NOTE — Progress Notes (Signed)
I,[name],acting as a Education administrator for [employer], PA-C.,have documented all relevant documentation on the behalf of Joanna Kirschner, PA-C,as directed by  Joanna Kirschner, PA-C while in the presence of Joanna Kirschner, PA-C.   Established patient visit   Patient: Joanna Bishop   DOB: June 01, 1955   67 y.o. Female  MRN: [id number] Visit Date: 05/15/2022  Today's healthcare provider: [name], PA-C   Cc. F/u hospitalization  Subjective    HPI  Follow up Hospitalization  Patient was admitted to [facility] on 05/06/22 and discharged on 05/10/22. She was treated for Sepsis (CMS-HCC) . Treatment for this included She was transitioned to ceftriaxone initially, followed by cephalexin based on sensitivities and will complete a 10 day course of antibiotics total. . Telephone follow up was done on 05/11/22 She reports excellent compliance with treatment. She reports this condition is  improved but believes she may not have a yeast infection . -----------------------------------------------------------------------------------------  Reports fasting BS are better, this AM was 145.  Medications: Outpatient Medications Prior to Visit  Medication Sig   acetaminophen (TYLENOL) 650 MG CR tablet Take 650 mg by mouth every 8 (eight) hours as needed for pain.   albuterol (VENTOLIN HFA) 108 (90 Base) MCG/ACT inhaler INHALE 1-2 PUFFS BY MOUTH into THE lungs every 4-6 hours AS NEEDED FOR WHEEZING AND/OR SHORTNESS OF BREATH   blood glucose meter kit and supplies Dispense based on patient and insurance preference. Use daily as directed. 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   budesonide-formoterol (SYMBICORT) 80-4.5 MCG/ACT inhaler Inhale 2 puffs into the lungs 2 (two) times daily.   Dulaglutide (TRULICITY) 3 0000000 SOPN Inject 3 mg as directed once a week. For Diabetes.   glipiZIDE (GLUCOTROL XL) 10 MG 24 hr tablet TAKE ONE TABLET BY MOUTH EVERY MORNING   glucose blood (ONETOUCH VERIO) test strip Use to check blood  sugars once daily as instructed   losartan (COZAAR) 100 MG tablet Take 1 tablet (100 mg total) by mouth daily.   montelukast (SINGULAIR) 10 MG tablet TAKE ONE TABLET BY MOUTH EVERYDAY AT BEDTIME   Multiple Vitamin (MULTIVITAMIN WITH MINERALS) TABS tablet Take 1 tablet by mouth daily. Centrum Silver Women's 50+   NON FORMULARY Take 1 capsule by mouth in the morning and at bedtime. AZO Bladder Control Supplement   OneTouch Delica Lancets 99991111 MISC Use to check blood sugars once daily as directed.   oxybutynin (DITROPAN-XL) 10 MG 24 hr tablet TAKE ONE TABLET BY MOUTH EVERYDAY AT BEDTIME   propranolol ER (INDERAL LA) 60 MG 24 hr capsule TAKE ONE CAPSULE BY MOUTH ONCE DAILY   rosuvastatin (CRESTOR) 5 MG tablet TAKE ONE TABLET BY MOUTH ONCE DAILY   Rimegepant Sulfate (NURTEC) 75 MG TBDP Take 1 mg by mouth daily as needed. At onset of migraine/aura (Patient not taking: Reported on 05/15/2022)   No facility-administered medications prior to visit.   Review of Systems  Constitutional:  Negative for fatigue and fever.  Respiratory:  Negative for cough and shortness of breath.   Cardiovascular:  Negative for chest pain and leg swelling.  Gastrointestinal:  Negative for abdominal pain.  Neurological:  Negative for dizziness and headaches.      Objective    BP 138/75 (BP Location: Left Arm, Patient Position: Sitting, Cuff Size: Large)   Pulse 84   Wt 232 lb (105.2 kg)   SpO2 100%   BMI 39.82 kg/m   Physical Exam Constitutional:      General: She is awake.     Appearance:  She is well-developed.  HENT:     Head: Normocephalic.  Eyes:     Conjunctiva/sclera: Conjunctivae normal.  Cardiovascular:     Rate and Rhythm: Normal rate and regular rhythm.     Heart sounds: Normal heart sounds.  Pulmonary:     Effort: Pulmonary effort is normal.     Breath sounds: Normal breath sounds.  Skin:    General: Skin is warm.  Neurological:     Mental Status: She is alert and oriented to person, place,  and time.  Psychiatric:        Attention and Perception: Attention normal.        Mood and Affect: Mood normal.        Speech: Speech normal.        Behavior: Behavior is cooperative.      No results found for any visits on 05/15/22.  Assessment & Plan     .1. Sepsis 2/2 UTI Improved on abx therapy. Reviewed labs. Pt had labs repeated at home, appear to have resolved. Feels and appears well.   2. Type 2 DM Repeat A1c today and uacr pt did not complete from last visit  3. Abx induced yeast infection Tx empirically, diflucan x 1 dose. If symptoms persist would ask pt to return for swab  Return if symptoms worsen or fail to improve.      I, Joanna Kirschner, PA-C have reviewed all documentation for this visit. The documentation on  05/15/22  for the exam, diagnosis, procedures, and orders are all accurate and complete.  Joanna Kirschner, PA-C [facility] [address] [place], [place], [address] Office: [phone] Fax: [place]

## 2022-05-16 LAB — HEMOGLOBIN A1C
Est. average glucose Bld gHb Est-mCnc: 192 mg/dL
Hgb A1c MFr Bld: 8.3 % — ABNORMAL HIGH (ref 4.8–5.6)

## 2022-05-16 LAB — MICROALBUMIN / CREATININE URINE RATIO
Creatinine, Urine: 58 mg/dL
Microalb/Creat Ratio: 187 mg/g creat — ABNORMAL HIGH (ref 0–29)
Microalbumin, Urine: 108.2 ug/mL

## 2022-05-22 ENCOUNTER — Telehealth: Payer: Self-pay

## 2022-05-22 NOTE — Telephone Encounter (Signed)
Colonoscopy pt for 4/4.  She needs to cancel and will call to reschedule.  She said her blood sugars are "out of wack".  Her PCP wants her to wait until they get BS in control.

## 2022-05-28 ENCOUNTER — Ambulatory Visit (INDEPENDENT_AMBULATORY_CARE_PROVIDER_SITE_OTHER): Payer: Medicare HMO | Admitting: Family Medicine

## 2022-05-28 ENCOUNTER — Ambulatory Visit: Payer: Medicare HMO

## 2022-05-28 VITALS — BP 120/78 | HR 97 | Temp 97.6°F | Resp 20 | Wt 234.0 lb

## 2022-05-28 DIAGNOSIS — N390 Urinary tract infection, site not specified: Secondary | ICD-10-CM | POA: Diagnosis not present

## 2022-05-28 DIAGNOSIS — R300 Dysuria: Secondary | ICD-10-CM | POA: Diagnosis not present

## 2022-05-28 DIAGNOSIS — E1169 Type 2 diabetes mellitus with other specified complication: Secondary | ICD-10-CM

## 2022-05-28 DIAGNOSIS — N1831 Chronic kidney disease, stage 3a: Secondary | ICD-10-CM

## 2022-05-28 LAB — POCT URINALYSIS DIPSTICK
Bilirubin, UA: NEGATIVE
Blood, UA: NEGATIVE
Glucose, UA: POSITIVE — AB
Ketones, UA: NEGATIVE
Nitrite, UA: NEGATIVE
Protein, UA: POSITIVE — AB
Spec Grav, UA: 1.01 (ref 1.010–1.025)
Urobilinogen, UA: 0.2 E.U./dL
pH, UA: 7 (ref 5.0–8.0)

## 2022-05-28 MED ORDER — ESTRADIOL 0.1 MG/GM VA CREA: 1.0000 | TOPICAL_CREAM | VAGINAL | 12 refills | Status: DC

## 2022-05-28 MED ORDER — BD PEN NEEDLE NANO U/F 32G X 4 MM MISC: 0 refills | Status: AC

## 2022-05-28 MED ORDER — SULFAMETHOXAZOLE-TRIMETHOPRIM 800-160 MG PO TABS: 1.0000 | ORAL_TABLET | Freq: Two times a day (BID) | ORAL | 0 refills | Status: AC

## 2022-05-28 MED ORDER — LANTUS SOLOSTAR 100 UNIT/ML ~~LOC~~ SOPN: 10.0000 [IU] | PEN_INJECTOR | Freq: Every day | SUBCUTANEOUS | 3 refills | Status: DC

## 2022-05-28 NOTE — Progress Notes (Signed)
Care Management & Coordination Services Pharmacy Note  05/28/2022 Name:  [name] MRN:  [id number] DOB:  Nov 02, 1955  Summary: Patient presents for follow-up consult.   -Patient's blood sugars remain elevated in setting of recent UTI infections despite attempts at lifestyle modification. Will plan to add long-acting insulin to help improve blood sugar control. -Patient would benefit from SGLT2, defer now given recurrent UTI infections.   Recommendations/Changes made from today's visit: -Start Lantus 10 units daily. Sample of Joanna Bishop provided.   Follow up plan: -HC to check on BG weekly -CPP follow-up one month  Subjective: Joanna Bishop is an 67 y.o. year old female who is a primary patient of Joanna Bishop, [name], [place].  The care coordination team was consulted for assistance with disease management and care coordination needs.    Engaged with patient by telephone for follow up visit.  Recent office visits: 05/02/22: Patient presented to Juleen China, PA-C for follow-up.   Recent consult visits: 05/15/22: Patient presented to [name], PA-C for follow-up. A1c 8.3%. Fluconazole.  Hospital visits: 05/06/22: Patient hospitalized for sepsis.   Objective:  Lab Results  Component Value Date   CREATININE 1.17 (H) 09/06/2021   BUN 23 09/06/2021   EGFR 52 (L) 09/06/2021   GFRNONAA 60 (L) 03/09/2021   GFRAA >60 06/25/2019   NA 137 09/06/2021   K 4.5 09/06/2021   CALCIUM 9.5 09/06/2021   CO2 19 (L) 09/06/2021   GLUCOSE 159 (H) 09/06/2021    Lab Results  Component Value Date/Time   HGBA1C 8.3 (H) 05/15/2022 02:12 PM   HGBA1C 6.0 (A) 09/06/2021 09:45 AM   HGBA1C 13.8 (A) 01/12/2021 01:33 PM   HGBA1C 8.8 (H) 03/12/2018 09:32 AM    Last diabetic Eye exam:  Lab Results  Component Value Date/Time   HMDIABEYEEXA No Retinopathy 04/04/2020 12:00 AM    Last diabetic Foot exam: No results found for: "HMDIABFOOTEX"   Lab Results  Component Value Date   CHOL 129 09/06/2021    HDL 36 (L) 09/06/2021   LDLCALC 49 09/06/2021   TRIG 288 (H) 09/06/2021   CHOLHDL 3.6 09/06/2021       Latest Ref Rng & Units 09/06/2021   10:13 AM 03/09/2021    6:49 PM 01/26/2021   10:18 AM  Hepatic Function  Total Protein 6.0 - 8.5 g/dL 6.7  7.4  6.5   Albumin 3.9 - 4.9 g/dL 4.3  4.5  4.5   AST 0 - 40 IU/L 21  30  30    ALT 0 - 32 IU/L 25  33  38   Alk Phosphatase 44 - 121 IU/L 84  90  141   Total Bilirubin 0.0 - 1.2 mg/dL 0.7  1.2  0.7     Lab Results  Component Value Date/Time   TSH 2.510 10/14/2015 11:41 AM   TSH 3.030 07/12/2015 07:23 PM   TSH 1.730 09/13/2014 10:26 AM       Latest Ref Rng & Units 09/06/2021   10:13 AM 05/02/2021    2:33 PM 03/09/2021    6:49 PM  CBC  WBC 3.4 - 10.8 x10E3/uL 11.0  11.8  11.4   Hemoglobin 11.1 - 15.9 g/dL 13.1  13.8  13.5   Hematocrit 34.0 - 46.6 % 39.6  41.1  39.2   Platelets 150 - 450 x10E3/uL 243  216  257     Lab Results  Component Value Date/Time   VD25OH 29.0 (L) 10/14/2015 11:41 AM    Clinical ASCVD: No  The  ASCVD Risk score (Arnett DK, et al., 2019) failed to calculate for the following reasons:   The valid total cholesterol range is 130 to 320 mg/dL       05/02/2022   10:43 AM 03/12/2022    9:00 AM 09/06/2021    9:04 AM  Depression screen PHQ 2/9  Decreased Interest 1 0 1  Down, Depressed, Hopeless 1 0 1  PHQ - 2 Score 2 0 2  Altered sleeping 1 0 1  Tired, decreased energy 1 0 1  Change in appetite 0 0 1  Feeling bad or failure about yourself  1 0 1  Trouble concentrating 1 0 1  Moving slowly or fidgety/restless 0 0 0  Suicidal thoughts 0 0 0  PHQ-9 Score 6 0 7  Difficult doing work/chores Somewhat difficult Not difficult at all Somewhat difficult     Social History   Tobacco Use  Smoking Status Never  Smokeless Tobacco Never   BP Readings from Last 3 Encounters:  05/15/22 138/75  05/02/22 138/88  09/06/21 132/86   Pulse Readings from Last 3 Encounters:  05/15/22 84  05/02/22 (!) 103  09/06/21  (!) 108   Wt Readings from Last 3 Encounters:  05/15/22 232 lb (105.2 kg)  05/02/22 236 lb 14.4 oz (107.5 kg)  03/12/22 213 lb (96.6 kg)   BMI Readings from Last 3 Encounters:  05/15/22 39.82 kg/m  05/02/22 40.66 kg/m  03/12/22 36.56 kg/m    Allergies  Allergen Reactions   Penicillins Swelling, Anaphylaxis and Other (See Comments)    Medications Reviewed Today     Reviewed by [name], PA-C (Physician Assistant Certified) on [id number] at 84  Med List Status: <None>   Medication Order Taking? Sig Documenting Provider Last Dose Status Informant  acetaminophen (TYLENOL) 650 MG CR tablet [id number] Yes Take 650 mg by mouth every 8 (eight) hours as needed for pain. [provider] Taking Active   albuterol (VENTOLIN HFA) 108 (90 Base) MCG/ACT inhaler [id number] Yes INHALE 1-2 PUFFS BY MOUTH into THE lungs every 4-6 hours AS NEEDED FOR WHEEZING AND/OR SHORTNESS OF BREATH [name], PA-C Taking Active   blood glucose meter kit and supplies [id number] Yes Dispense based on patient and insurance preference. Use daily as directed. 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 Virginia Crews, MD Taking Active   budesonide-formoterol [facility]) 80-4.5 MCG/ACT inhaler [id number] Yes Inhale 2 puffs into the lungs 2 (two) times daily. [name], PA-C Taking Active   Dulaglutide (TRULICITY) 3 0000000 SOPN [id number] Yes Inject 3 mg as directed once a week. For Diabetes. [name], PA-C Taking Active   fluconazole (DIFLUCAN) 150 MG tablet [id number] Yes Take 1 tablet (150 mg total) by mouth once for 1 dose. [name], PA-C  Active   glipiZIDE (GLUCOTROL XL) 10 MG 24 hr tablet [id number] Yes TAKE ONE TABLET BY MOUTH EVERY MORNING [name], PA-C Taking Active   glucose blood (ONETOUCH VERIO) test strip [id number] Yes Use to check blood sugars once daily as instructed Virginia Crews, MD Taking Active   losartan (COZAAR) 100 MG tablet [id number] Yes Take 1 tablet (100 mg  total) by mouth daily. [name], PA-C Taking Active   montelukast (SINGULAIR) 10 MG tablet [id number] Yes TAKE ONE TABLET BY MOUTH EVERYDAY AT BEDTIME [name], PA-C Taking Active   Multiple Vitamin (MULTIVITAMIN WITH MINERALS) TABS tablet [id number] Yes Take 1 tablet by mouth daily. Centrum Silver Women's 50+ [provider] Taking Active   NON FORMULARY [id number] Yes Take 1 capsule  by mouth in the morning and at bedtime. AZO Bladder Control Supplement [provider] Taking Active   OneTouch Delica Lancets [id number] MISC [id number] Yes Use to check blood sugars once daily as directed. Virginia Crews, MD Taking Active   oxybutynin (DITROPAN-XL) 10 MG 24 hr tablet [id number] Yes TAKE ONE TABLET BY MOUTH EVERYDAY AT BEDTIME [name], PA-C Taking Active   propranolol ER (INDERAL LA) 60 MG 24 hr capsule [id number] Yes TAKE ONE CAPSULE BY MOUTH ONCE DAILY [name], PA-C Taking Active   Rimegepant Sulfate (NURTEC) 75 MG TBDP [id number] No Take 1 mg by mouth daily as needed. At onset of migraine/aura  Patient not taking: Reported on 05/15/2022   [name], PA-C Not Taking Active   rosuvastatin (CRESTOR) 5 MG tablet [id number] Yes TAKE ONE TABLET BY MOUTH ONCE DAILY [name], PA-C Taking Active             SDOH:  (Social Determinants of Health) assessments and interventions performed: Yes SDOH Interventions    Flowsheet Row Telephone from 05/11/2022 in [place] Coordination Clinical Support from 03/12/2022 in [place] Management from 05/15/2021 in [place] Management from 03/20/2021 in [place] from 03/08/2021 in [place] Management from 02/21/2021 in [place] Interventions        Food Insecurity Interventions  Intervention Not Indicated Intervention Not Indicated -- -- Intervention Not Indicated --  Housing Interventions Intervention Not Indicated Intervention Not Indicated -- -- -- --  Transportation Interventions -- Intervention Not Indicated -- -- Intervention Not Indicated --  Utilities Interventions -- Intervention Not Indicated -- -- -- --  Alcohol Usage Interventions -- Intervention Not Indicated (Score <7) -- -- -- --  Financial Strain Interventions -- Intervention Not Indicated Intervention Not Indicated Intervention Not Indicated Intervention Not Indicated Intervention Not Indicated  Physical Activity Interventions -- Intervention Not Indicated -- -- Intervention Not Indicated --  Stress Interventions -- Intervention Not Indicated -- -- Intervention Not Indicated --  Social Connections Interventions -- Intervention Not Indicated -- -- Intervention Not Indicated --       Medication Assistance: None required.  Patient affirms current coverage meets needs.  Medication Access: Within the past 30 days, how often has patient missed a dose of medication? None Is a pillbox or other method used to improve adherence? Yes  Factors that may affect medication adherence? no barriers identified Are meds synced by current pharmacy? Yes  Are meds delivered by current pharmacy? Yes  Does patient experience delays in picking up medications due to transportation concerns? No   [facility] Reviewed: Is patient disadvantaged to use [facility]?: No  Current Rx insurance plan: Physicist, medical Name and location of Current pharmacy:  [facility] - [place], [place] - [address] [place] [address] Phone: [phone] Fax: [phone]  [facility] [place], [place] [address] [facility] [address] [place] [address] Phone: [phone] Fax: [phone]  [facility] [place], [place] [facility] [address] AT [place] [facility] [place] [address] Phone: [phone] Fax: [phone]  Reason patient declined to change pharmacies: Patient is already actively enrolled with [facility]  Compliance/Adherence/Medication fill history: Care Gaps: Covid Vaccine  HIV Screening  Shingrix  Pneumonia  Foot Exam  Influenza   Star-Rating Drugs: Rosuvastatin 5 mg: LF 04/10/22 for 30-DS from [facility]   Losartan 100 mg: LF 04/10/22 for 30-DS from [facility]   Glipizide 10 mg: LF 04/10/22 for 30-DS from [place]  Assessment/Plan  Hypertension (BP goal <130/80) -Uncontrolled:  -Current treatment: Losartan 100 mg daily: Appropriate, Query effective  Propranolol ER 60 mg daily: Appropriate, Query effective   -Medications previously tried:  -Current home readings:  137/88 124/82 134/97 144/90 163/90  -Denies hypotensive/hypertensive symptoms -Continue current medications   Hyperlipidemia: (LDL goal < 70) -Controlled: Not addressed during this visit. -Current treatment: Rosuvastatin 5 mg daily (AM) -Medications previously tried: NA  -Recommended to continue current medication  Diabetes (A1c goal <7%) -Uncontrolled -Current medications: Glipizide XL 10 mg daily: Appropriate, Query effective  Trulicity 3 mg weekly: Appropriate, Query effective -Medications previously tried: Metformin (sluggish, stomach pain), finerenone (cost)   -Current home glucose readings 209, 177, 216, 220, 240, 225 -Reports hyperglycemia symptoms: blurry vision.  -Current meal patterns: Primarily drinks diet soda OR water + flavor packets.   -Current exercise: Plan to starts silver group exercise classes once weekly and chair exercises.  -Patient's blood sugars remain elevated in setting of recent UTI infections despite attempts at lifestyle modification. Will plan to add long-acting insulin to help improve blood sugar control. -Patient  would benefit from SGLT2, defer now given recurrent UTI infections.  -Start Lantus 10 units daily. Sample of Joanna Bishop provided.   Asthma (Goal: control symptoms and prevent exacerbations) -Improved -Current treatment  Ventolin HFA 1-2 puffs every 4-6 hours as needed Symbicort 80-4.5 2 puffs twice daily  Montelukast 10 mg daily  -Medications previously tried: NA  -Exacerbations requiring treatment in last 6 months: None In the past four weeks, has the patient had:  Daytime asthma symptoms more than twice weekly?   No Any instance of night waking due to asthma?   No Utilized albuterol reliever for symptoms more than twice weekly? No: 2-3 x in past month  Any activity limitation due to asthma?    Yes -Patient reports consistent use of maintenance inhaler.   Bipolar Disorder (Goal: Achieve symptom remission) -Not ideally controlled: Not addressed during this visit. -Current treatment: None  -Medications previously tried/failed: Divalproex, Benztropine, Wellbutrin, Citalopram, Sertraline  -Counseled on importance of keeping upcoming psychiatry appointment   Migraines (Goal: Prevent migraines) -Controlled -Current treatment  Propanolol ER 60 mg daily  Rimegepant 75 mg daily as needed: Appropriate, Query effective  -Medications previously tried: Divalproex, Excedrin (GI Bleed) -Continue current medications   Chronic Pain (Goal: Minimize symptoms) -Controlled: Not addressed during this visit. -Managed by Carlynn Spry, PA-C (Ortho) -Current treatment  Baclofen 10 mg twice daily as needed  Boswellia extract 500 mg twice daily  Tramadol 50 mg q6h PRN  -Medications previously tried: NA -Requires rare use of tramadol   -Recommended to continue current medication  Overactive Bladder (Goal: Minimize symptoms) -Controlled -Current treatment  Oxybutynin XL 10 mg daily  -Medications previously tried: NA -Excellent symptomatic control.   -Recommended to continue current  medication  Junius Argyle, PharmD, [place], [place] Pharmacist Practitioner  [facility] [phone]

## 2022-05-28 NOTE — Progress Notes (Signed)
I,Sulibeya S Dimas,acting as a Education administrator for Lavon Paganini, MD.,have documented all relevant documentation on the behalf of Lavon Paganini, MD,as directed by  Lavon Paganini, MD while in the presence of Lavon Paganini, MD.     Established patient visit   Patient: Joanna Bishop   DOB: 12/27/1955   67 y.o. Female  MRN: [id number] Visit Date: 05/28/2022  Today's healthcare provider: Lavon Paganini, MD   Chief Complaint  Patient presents with   Urinary Tract Infection   Subjective    HPI  Urinary symptoms  She reports new onset cloudy malodorous urine, dysuria, urinary frequency, and urinary urgency. The current episode started  3 days ago and is staying constant. Patient states symptoms are moderate in intensity, occurring constantly. She  has been recently treated for similar symptoms. ---------------------------------------------------------------------------------------   Medications: Outpatient Medications Prior to Visit  Medication Sig   acetaminophen (TYLENOL) 650 MG CR tablet Take 650 mg by mouth every 8 (eight) hours as needed for pain.   albuterol (VENTOLIN HFA) 108 (90 Base) MCG/ACT inhaler INHALE 1-2 PUFFS BY MOUTH into THE lungs every 4-6 hours AS NEEDED FOR WHEEZING AND/OR SHORTNESS OF BREATH   blood glucose meter kit and supplies Dispense based on patient and insurance preference. Use daily as directed. 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   budesonide-formoterol (SYMBICORT) 80-4.5 MCG/ACT inhaler Inhale 2 puffs into the lungs 2 (two) times daily.   Dulaglutide (TRULICITY) 3 0000000 SOPN Inject 3 mg as directed once a week. For Diabetes.   glipiZIDE (GLUCOTROL XL) 10 MG 24 hr tablet TAKE ONE TABLET BY MOUTH EVERY MORNING   glucose blood (ONETOUCH VERIO) test strip Use to check blood sugars once daily as instructed   insulin glargine (LANTUS SOLOSTAR) 100 UNIT/ML Solostar Pen Inject 10-50 Units into the skin daily. Titrate as directed.   Insulin Pen Needle (BD PEN NEEDLE  NANO U/F) 32G X 4 MM MISC Use to inject insulin daily as directed   losartan (COZAAR) 100 MG tablet Take 1 tablet (100 mg total) by mouth daily.   montelukast (SINGULAIR) 10 MG tablet TAKE ONE TABLET BY MOUTH EVERYDAY AT BEDTIME   Multiple Vitamin (MULTIVITAMIN WITH MINERALS) TABS tablet Take 1 tablet by mouth daily. Centrum Silver Women's 50+   NON FORMULARY Take 1 capsule by mouth in the morning and at bedtime. AZO Bladder Control Supplement   OneTouch Delica Lancets 99991111 MISC Use to check blood sugars once daily as directed.   oxybutynin (DITROPAN-XL) 10 MG 24 hr tablet TAKE ONE TABLET BY MOUTH EVERYDAY AT BEDTIME   propranolol ER (INDERAL LA) 60 MG 24 hr capsule TAKE ONE CAPSULE BY MOUTH ONCE DAILY   Rimegepant Sulfate (NURTEC) 75 MG TBDP Take 1 mg by mouth daily as needed. At onset of migraine/aura (Patient not taking: Reported on 05/15/2022)   rosuvastatin (CRESTOR) 5 MG tablet TAKE ONE TABLET BY MOUTH ONCE DAILY   No facility-administered medications prior to visit.    Review of Systems  Constitutional:  Positive for fever. Negative for chills.  Respiratory:  Negative for chest tightness and shortness of breath.   Gastrointestinal:  Negative for abdominal pain, blood in stool, constipation, diarrhea, nausea and vomiting.  Genitourinary:  Positive for dysuria, frequency and urgency. Negative for hematuria.       Objective    BP 120/78 (BP Location: Left Arm, Patient Position: Sitting, Cuff Size: Large)   Pulse 97   Temp 97.6 F (36.4 C) (Temporal)   Resp 20   Wt 234 lb (106.1 kg)  SpO2 96%   BMI 40.17 kg/m    Physical Exam Vitals reviewed.  Constitutional:      General: She is not in acute distress.    Appearance: She is well-developed.  HENT:     Head: Normocephalic and atraumatic.  Eyes:     General: No scleral icterus.    Conjunctiva/sclera: Conjunctivae normal.  Cardiovascular:     Rate and Rhythm: Normal rate and regular rhythm.     Heart sounds: Normal heart  sounds. No murmur heard. Pulmonary:     Effort: Pulmonary effort is normal. No respiratory distress.     Breath sounds: Normal breath sounds. No wheezing or rales.  Abdominal:     General: There is no distension.     Palpations: Abdomen is soft.     Tenderness: There is no abdominal tenderness. There is no guarding or rebound.  Skin:    General: Skin is warm and dry.     Capillary Refill: Capillary refill takes less than 2 seconds.     Findings: No rash.  Neurological:     Mental Status: She is alert and oriented to person, place, and time.  Psychiatric:        Behavior: Behavior normal.       Results for orders placed or performed in visit on 05/28/22  POCT urinalysis dipstick  Result Value Ref Range   Color, UA yellow    Clarity, UA cloudy    Glucose, UA Positive (A) Negative   Bilirubin, UA Negative    Ketones, UA Negative    Spec Grav, UA 1.010 1.010 - 1.025   Blood, UA negtive    pH, UA 7.0 5.0 - 8.0   Protein, UA Positive (A) Negative   Urobilinogen, UA 0.2 0.2 or 1.0 E.U./dL   Nitrite, UA negative    Leukocytes, UA Moderate (2+) (A) Negative    Assessment & Plan     1. Dysuria 2. Recurrent UTI - Symptoms and UA consistent with UTI -No systemic symptoms or signs of pyelonephritis -Will start treatment with 5day course of Bactrim after reviewing previous urine culture  - had recent hospitalization for sepsis 2/2 UTI with possible pyelo -We will send urine culture to confirm sensitivities -Discussed return precautions  - Estrace for recurrent UTIs since menopause - POCT urinalysis dipstick - Urine Culture   Meds ordered this encounter  Medications   sulfamethoxazole-trimethoprim (BACTRIM DS) 800-160 MG tablet    Sig: Take 1 tablet by mouth 2 (two) times daily for 5 days.    Dispense:  10 tablet    Refill:  0   estradiol (ESTRACE VAGINAL) 0.1 MG/GM vaginal cream    Sig: Place 1 Applicatorful vaginally 3 (three) times a week.    Dispense:  42.5 g     Refill:  12     Return if symptoms worsen or fail to improve.      I, Lavon Paganini, MD, have reviewed all documentation for this visit. The documentation on 05/28/22 for the exam, diagnosis, procedures, and orders are all accurate and complete.   Laketia Vicknair, Dionne Bucy, MD, MPH [facility]

## 2022-05-30 DIAGNOSIS — H01006 Unspecified blepharitis left eye, unspecified eyelid: Secondary | ICD-10-CM | POA: Diagnosis not present

## 2022-05-30 DIAGNOSIS — Z7984 Long term (current) use of oral hypoglycemic drugs: Secondary | ICD-10-CM | POA: Diagnosis not present

## 2022-05-30 DIAGNOSIS — E119 Type 2 diabetes mellitus without complications: Secondary | ICD-10-CM | POA: Diagnosis not present

## 2022-05-30 DIAGNOSIS — H2513 Age-related nuclear cataract, bilateral: Secondary | ICD-10-CM | POA: Diagnosis not present

## 2022-05-30 DIAGNOSIS — H01003 Unspecified blepharitis right eye, unspecified eyelid: Secondary | ICD-10-CM | POA: Diagnosis not present

## 2022-05-30 DIAGNOSIS — H52223 Regular astigmatism, bilateral: Secondary | ICD-10-CM | POA: Diagnosis not present

## 2022-05-30 DIAGNOSIS — H5213 Myopia, bilateral: Secondary | ICD-10-CM | POA: Diagnosis not present

## 2022-05-30 DIAGNOSIS — H524 Presbyopia: Secondary | ICD-10-CM | POA: Diagnosis not present

## 2022-05-30 LAB — HM DIABETES EYE EXAM

## 2022-05-31 ENCOUNTER — Ambulatory Visit: Admit: 2022-05-31 | Payer: Medicare HMO | Admitting: Gastroenterology

## 2022-05-31 LAB — URINE CULTURE

## 2022-05-31 SURGERY — COLONOSCOPY WITH PROPOFOL
Anesthesia: Choice

## 2022-06-01 ENCOUNTER — Telehealth: Payer: Self-pay

## 2022-06-01 NOTE — Progress Notes (Signed)
Care Management & Coordination Services Pharmacy Team Pharmacy Assistant   Name: Joanna Bishop  MRN: [id number] DOB: 04/24/1955  Reason for Encounter: Medication Coordination and Delivery for [facility]/Schedule 1 month follow-up  Contacted patient to discuss medications and coordinate delivery from [facility]. Spoke with patient on 06/01/2022   Chart review: Recent office visits:  None ID  Recent consult visits:  None ID  Hospital visits:  None in previous 6 months  Medications: Outpatient Encounter Medications as of 06/01/2022  Medication Sig   acetaminophen (TYLENOL) 650 MG CR tablet Take 650 mg by mouth every 8 (eight) hours as needed for pain.   albuterol (VENTOLIN HFA) 108 (90 Base) MCG/ACT inhaler INHALE 1-2 PUFFS BY MOUTH into THE lungs every 4-6 hours AS NEEDED FOR WHEEZING AND/OR SHORTNESS OF BREATH   blood glucose meter kit and supplies Dispense based on patient and insurance preference. Use daily as directed. 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   budesonide-formoterol (SYMBICORT) 80-4.5 MCG/ACT inhaler Inhale 2 puffs into the lungs 2 (two) times daily.   Dulaglutide (TRULICITY) 3 MG/0.5ML SOPN Inject 3 mg as directed once a week. For Diabetes.   estradiol (ESTRACE VAGINAL) 0.1 MG/GM vaginal cream Place 1 Applicatorful vaginally 3 (three) times a week.   glipiZIDE (GLUCOTROL XL) 10 MG 24 hr tablet TAKE ONE TABLET BY MOUTH EVERY MORNING   glucose blood (ONETOUCH VERIO) test strip Use to check blood sugars once daily as instructed   insulin glargine (LANTUS SOLOSTAR) 100 UNIT/ML Solostar Pen Inject 10-50 Units into the skin daily. Titrate as directed.   Insulin Pen Needle (BD PEN NEEDLE NANO U/F) 32G X 4 MM MISC Use to inject insulin daily as directed   losartan (COZAAR) 100 MG tablet Take 1 tablet (100 mg total) by mouth daily.   montelukast (SINGULAIR) 10 MG tablet TAKE ONE TABLET BY MOUTH EVERYDAY AT BEDTIME   Multiple Vitamin (MULTIVITAMIN WITH MINERALS) TABS tablet Take 1  tablet by mouth daily. Centrum Silver Women's 50+   NON FORMULARY Take 1 capsule by mouth in the morning and at bedtime. AZO Bladder Control Supplement   OneTouch Delica Lancets 33G MISC Use to check blood sugars once daily as directed.   oxybutynin (DITROPAN-XL) 10 MG 24 hr tablet TAKE ONE TABLET BY MOUTH EVERYDAY AT BEDTIME   propranolol ER (INDERAL LA) 60 MG 24 hr capsule TAKE ONE CAPSULE BY MOUTH ONCE DAILY   Rimegepant Sulfate (NURTEC) 75 MG TBDP Take 1 mg by mouth daily as needed. At onset of migraine/aura (Patient not taking: Reported on 05/15/2022)   rosuvastatin (CRESTOR) 5 MG tablet TAKE ONE TABLET BY MOUTH ONCE DAILY   sulfamethoxazole-trimethoprim (BACTRIM DS) 800-160 MG tablet Take 1 tablet by mouth 2 (two) times daily for 5 days.   No facility-administered encounter medications on file as of 06/01/2022.   BP Readings from Last 3 Encounters:  05/28/22 120/78  05/15/22 138/75  05/02/22 138/88    Pulse Readings from Last 3 Encounters:  05/28/22 97  05/15/22 84  05/02/22 (!) 103    Lab Results  Component Value Date/Time   HGBA1C 8.3 (H) 05/15/2022 02:12 PM   HGBA1C 6.0 (A) 09/06/2021 09:45 AM   HGBA1C 13.8 (A) 01/12/2021 01:33 PM   HGBA1C 8.8 (H) 03/12/2018 09:32 AM   Lab Results  Component Value Date   CREATININE 1.17 (H) 09/06/2021   BUN 23 09/06/2021   GFRNONAA 60 (L) 03/09/2021   GFRAA >60 06/25/2019   NA 137 09/06/2021   K 4.5 09/06/2021   CALCIUM  9.5 09/06/2021   CO2 19 (L) 09/06/2021   Cycle dispensing form sent to Leilani Able, [facility] for review.   Last adherence delivery date:   05/15/2022    Patient is due for next adherence delivery on: 06/13/2022 1st Route  This delivery to include: Vials  30 Days  Losartan 50 mg 1 tablet daily (Breakfast) Glipizide 10 mg 1 tablet daily (Breakfast) Montelukast 10 mg 1 tablet daily (Bedtime) Rosuvastatin 5 mg 1 tablet daily (Breakfast) Oxybutynin 10 mg 1 tablet daily (Bedtime) Propanolol ER 60 mg daily   Patient  declined the following medications this month: OneTouch Strips - Adequate supply-Patient will need next month OneTouch Lancets- Adequate supply-Patient will need next month Albuterol 108 Inhaler inhale 1-2 puffs every every 4-6 hours prn for wheezing or shortness of breath  Nurtec 75 mg 1 tablet as needed at onset of migraine-Patient receives this medication through [facility] Trulicity Inject 1.5 mg into the skin once a week- Patient is getting this medication through [facility] Symbicort 80-4.5 mcg Inhaler Inhale 2 puffs twice daily-Patient will be getting this medication from [facility]  No refill request needed.  Confirmed delivery date of 06/13/2022 1st Route, advised patient that pharmacy will contact them the morning of delivery.  Any concerns about your medications? No  How often do you forget or accidentally miss a dose? Never  Do you use a pillbox? No  Is patient in packaging No  Recent blood pressure readings are as follows: 120/78  Recent blood glucose readings are as follows:  Date AM PM  04/02 259 290  04/03 292 208  04/04 199 320    Per patient her blood sugars are continuing to run elevated. She did start the Tresiba 10 units daily sample pen that was provided to her. Patient has not yet started the Lantus as prescription was sent to the [facility] pharmacy and she has not yet received the medication. Per patient she is going to call them today to see if she can get a delivery date status.  Updated Glucose numbers sent to CPP for review. CPP would like the patient to increase her Tresiba to 12 units and for me to check back with her on Monday.  I contacted the patient and informed her to increase her Tresiba to 12 units and I will contact her on Monday to check in to see how her blood sugars are doing.    [name], CPA/CMA Clinical Pharmacist Assistant Phone: [phone]

## 2022-06-04 ENCOUNTER — Telehealth: Payer: Self-pay

## 2022-06-04 NOTE — Progress Notes (Addendum)
Care Coordination Pharmacy Assistant   Name: Joanna Bishop  MRN: [id number] DOB: 13-Jul-1955   Reason for Encounter: DM glucose follow-up   I spoke with the patient and she reports since she increased the [place] to 12 units her blood sugars have started to decrease and get back to normal. Per patient she contacted the [facility] and they advised her that her lantus should be here this week.   Date AM  04/06 195 after a meal  04/07  183 after a meal  04/08 137 fasting   I updated CPP with the above information  Addendum  CPP sent a response to my message requesting that I contact the patient to inform her of the following:  Please ask her to increase her Tresiba to 14 units daily. Our goal for her morning blood sugars is between 90-130. If she has multiple days where it is less than 90 she should let us know. Otherwise you can check in with again on Friday.   I spoke with the patient and advised her of the above and the patient verbalized understanding.  Medications: Outpatient Encounter Medications as of 06/04/2022  Medication Sig   acetaminophen (TYLENOL) 650 MG CR tablet Take 650 mg by mouth every 8 (eight) hours as needed for pain.   albuterol (VENTOLIN HFA) 108 (90 Base) MCG/ACT inhaler INHALE 1-2 PUFFS BY MOUTH into THE lungs every 4-6 hours AS NEEDED FOR WHEEZING AND/OR SHORTNESS OF BREATH   blood glucose meter kit and supplies Dispense based on patient and insurance preference. Use daily as directed. 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   budesonide-formoterol (SYMBICORT) 80-4.5 MCG/ACT inhaler Inhale 2 puffs into the lungs 2 (two) times daily.   Dulaglutide (TRULICITY) 3 MG/0.5ML SOPN Inject 3 mg as directed once a week. For Diabetes.   estradiol (ESTRACE VAGINAL) 0.1 MG/GM vaginal cream Place 1 Applicatorful vaginally 3 (three) times a week.   glipiZIDE (GLUCOTROL XL) 10 MG 24 hr tablet TAKE ONE TABLET BY MOUTH EVERY MORNING   glucose blood (ONETOUCH VERIO) test strip Use to check blood  sugars once daily as instructed   insulin glargine (LANTUS SOLOSTAR) 100 UNIT/ML Solostar Pen Inject 10-50 Units into the skin daily. Titrate as directed.   Insulin Pen Needle (BD PEN NEEDLE NANO U/F) 32G X 4 MM MISC Use to inject insulin daily as directed   losartan (COZAAR) 100 MG tablet Take 1 tablet (100 mg total) by mouth daily.   montelukast (SINGULAIR) 10 MG tablet TAKE ONE TABLET BY MOUTH EVERYDAY AT BEDTIME   Multiple Vitamin (MULTIVITAMIN WITH MINERALS) TABS tablet Take 1 tablet by mouth daily. Centrum Silver Women's 50+   NON FORMULARY Take 1 capsule by mouth in the morning and at bedtime. AZO Bladder Control Supplement   OneTouch Delica Lancets 33G MISC Use to check blood sugars once daily as directed.   oxybutynin (DITROPAN-XL) 10 MG 24 hr tablet TAKE ONE TABLET BY MOUTH EVERYDAY AT BEDTIME   propranolol ER (INDERAL LA) 60 MG 24 hr capsule TAKE ONE CAPSULE BY MOUTH ONCE DAILY   Rimegepant Sulfate (NURTEC) 75 MG TBDP Take 1 mg by mouth daily as needed. At onset of migraine/aura (Patient not taking: Reported on 05/15/2022)   rosuvastatin (CRESTOR) 5 MG tablet TAKE ONE TABLET BY MOUTH ONCE DAILY   No facility-administered encounter medications on file as of 06/04/2022.    [name], CPA/CMA Clinical Pharmacist Assistant Phone: [phone]

## 2022-06-07 ENCOUNTER — Telehealth: Payer: Self-pay

## 2022-06-07 NOTE — Progress Notes (Signed)
Care Coordination Pharmacy Assistant   Name: Joanna Bishop  MRN: [id number] DOB: [birth year]  Reason for Encounter: Medication Coordination/Glucose reading follow-up   I contacted the patient to follow-up to see how her blood sugar readings have been since increasing her Tresiba to 14 units. Per patient her blood sugars are still not normal, and she has not started her Lantus but according to the [place] the Lantus should be delivered today.  Patients most current readings are.  Date AM (FASTING) PM  04/08 137 268  04/09 176 258  04/10 204 294  04/11 181    CPP notified of the above and would like the patient to increase her Tresiba to 16 units daily. Once patient completes Evaristo Bury pen she can switch to the Lantus 16 Units daily at the same time she has been taking her [place].  Patient advised to increase her Evaristo Bury to 16 units daily and once she completes her Evaristo Bury pen to start 16 units daily with her Lantus to be given same time as she gives her [place]. I instructed patient I would follow-up next week. Patient verbalized understanding to all.  Medications: Outpatient Encounter Medications as of 06/07/2022  Medication Sig   acetaminophen (TYLENOL) 650 MG CR tablet Take 650 mg by mouth every 8 (eight) hours as needed for pain.   albuterol (VENTOLIN HFA) 108 (90 Base) MCG/ACT inhaler INHALE 1-2 PUFFS BY MOUTH into THE lungs every 4-6 hours AS NEEDED FOR WHEEZING AND/OR SHORTNESS OF BREATH   blood glucose meter kit and supplies Dispense based on patient and insurance preference. Use daily as directed. 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   budesonide-formoterol (SYMBICORT) 80-4.5 MCG/ACT inhaler Inhale 2 puffs into the lungs 2 (two) times daily.   Dulaglutide (TRULICITY) 3 MG/0.5ML SOPN Inject 3 mg as directed once a week. For Diabetes.   estradiol (ESTRACE VAGINAL) 0.1 MG/GM vaginal cream Place 1 Applicatorful vaginally 3 (three) times a week.   glipiZIDE (GLUCOTROL XL) 10 MG 24 hr tablet TAKE ONE TABLET BY  MOUTH EVERY MORNING   glucose blood (ONETOUCH VERIO) test strip Use to check blood sugars once daily as instructed   insulin glargine (LANTUS SOLOSTAR) 100 UNIT/ML Solostar Pen Inject 10-50 Units into the skin daily. Titrate as directed.   Insulin Pen Needle (BD PEN NEEDLE NANO U/F) 32G X 4 MM MISC Use to inject insulin daily as directed   losartan (COZAAR) 100 MG tablet Take 1 tablet (100 mg total) by mouth daily.   montelukast (SINGULAIR) 10 MG tablet TAKE ONE TABLET BY MOUTH EVERYDAY AT BEDTIME   Multiple Vitamin (MULTIVITAMIN WITH MINERALS) TABS tablet Take 1 tablet by mouth daily. Centrum Silver Women's 50+   NON FORMULARY Take 1 capsule by mouth in the morning and at bedtime. AZO Bladder Control Supplement   OneTouch Delica Lancets 33G MISC Use to check blood sugars once daily as directed.   oxybutynin (DITROPAN-XL) 10 MG 24 hr tablet TAKE ONE TABLET BY MOUTH EVERYDAY AT BEDTIME   propranolol ER (INDERAL LA) 60 MG 24 hr capsule TAKE ONE CAPSULE BY MOUTH ONCE DAILY   Rimegepant Sulfate (NURTEC) 75 MG TBDP Take 1 mg by mouth daily as needed. At onset of migraine/aura (Patient not taking: Reported on 05/15/2022)   rosuvastatin (CRESTOR) 5 MG tablet TAKE ONE TABLET BY MOUTH ONCE DAILY   No facility-administered encounter medications on file as of 06/07/2022.    [name], CPA/CMA Clinical Pharmacist Assistant Phone: [phone]

## 2022-06-13 ENCOUNTER — Encounter: Payer: Self-pay | Admitting: Physician Assistant

## 2022-06-15 ENCOUNTER — Telehealth: Payer: Self-pay

## 2022-06-15 NOTE — Progress Notes (Signed)
Care Coordination Pharmacy Assistant   Name: Joanna Bishop  MRN: [id number] DOB: [birth year]  Reason for Encounter:  DM follow-up   I contacted patient to follow-up with her regarding her blood glucose readings since increasing her Tresiba to 14 units. I had to leave a voice message requesting that the patient give me a call back.  10:39 am Patient called me back and stated that her blood glucose readings are getting worse. Per patient she changed the batteries in her meter hoping that maybe that was the reason behind her high numbers and after changing the batteries she still was experiencing highs. Patient stated that she did receive her Lantus as well. I informed patient that I would send all this information over to CPP, and give her a call back regarding further instructions.   Date AM  PM  04/19 207 Fasting   04/18 175 Fasting 349  04/17 241 after breakfast 288  04/16 175 Fasting 350  04/15 272 302  04/14 455 2 pm after a meal 388   1345- CPP sent me a message back requesting I double check with the patient to make sure she is taking all of her prescribed medications as directed. Per CPP  "Her blood sugars seemed to have increased this week since last time we checked in. Can you please confirm that she is taking Trulicity 3 mg weekly, glipizide 10 mg daily in addition to her Tresiba 16 units daily? Starting today she can increase her Tresiba/ Lantus to 20 units daily"  I spoke to the patient and she confirms that she is taking her [place] on Monday and her Glipizide and [place] as directed. I informed patient to increase her Evaristo Bury to 20 units daily and once she is out of the [place] she will do 20 units daily of the Lantus. Patient verbalized understanding and is okay with me giving her a call next week to check to see how her blood sugars are doing with the increase.  Medications: Outpatient Encounter Medications as of 06/15/2022  Medication Sig   acetaminophen (TYLENOL) 650 MG CR  tablet Take 650 mg by mouth every 8 (eight) hours as needed for pain.   albuterol (VENTOLIN HFA) 108 (90 Base) MCG/ACT inhaler INHALE 1-2 PUFFS BY MOUTH into THE lungs every 4-6 hours AS NEEDED FOR WHEEZING AND/OR SHORTNESS OF BREATH   blood glucose meter kit and supplies Dispense based on patient and insurance preference. Use daily as directed. 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   budesonide-formoterol (SYMBICORT) 80-4.5 MCG/ACT inhaler Inhale 2 puffs into the lungs 2 (two) times daily.   Dulaglutide (TRULICITY) 3 MG/0.5ML SOPN Inject 3 mg as directed once a week. For Diabetes.   estradiol (ESTRACE VAGINAL) 0.1 MG/GM vaginal cream Place 1 Applicatorful vaginally 3 (three) times a week.   glipiZIDE (GLUCOTROL XL) 10 MG 24 hr tablet TAKE ONE TABLET BY MOUTH EVERY MORNING   glucose blood (ONETOUCH VERIO) test strip Use to check blood sugars once daily as instructed   insulin glargine (LANTUS SOLOSTAR) 100 UNIT/ML Solostar Pen Inject 10-50 Units into the skin daily. Titrate as directed.   Insulin Pen Needle (BD PEN NEEDLE NANO U/F) 32G X 4 MM MISC Use to inject insulin daily as directed   losartan (COZAAR) 100 MG tablet Take 1 tablet (100 mg total) by mouth daily.   montelukast (SINGULAIR) 10 MG tablet TAKE ONE TABLET BY MOUTH EVERYDAY AT BEDTIME   Multiple Vitamin (MULTIVITAMIN WITH MINERALS) TABS tablet Take 1 tablet by mouth daily. Centrum Silver  Women's 50+   NON FORMULARY Take 1 capsule by mouth in the morning and at bedtime. AZO Bladder Control Supplement   OneTouch Delica Lancets 33G MISC Use to check blood sugars once daily as directed.   oxybutynin (DITROPAN-XL) 10 MG 24 hr tablet TAKE ONE TABLET BY MOUTH EVERYDAY AT BEDTIME   propranolol ER (INDERAL LA) 60 MG 24 hr capsule TAKE ONE CAPSULE BY MOUTH ONCE DAILY   Rimegepant Sulfate (NURTEC) 75 MG TBDP Take 1 mg by mouth daily as needed. At onset of migraine/aura (Patient not taking: Reported on 05/15/2022)   rosuvastatin (CRESTOR) 5 MG tablet TAKE ONE TABLET  BY MOUTH ONCE DAILY   No facility-administered encounter medications on file as of 06/15/2022.    [name], CPA/CMA Clinical Pharmacist Assistant Phone: [phone]

## 2022-06-19 ENCOUNTER — Telehealth: Payer: Self-pay

## 2022-06-19 NOTE — Progress Notes (Signed)
Care Coordination Pharmacy Assistant   Name: Joanna Bishop  MRN: [id number] DOB: [birth year]  Reason for Encounter: DM Follow-up   I reached out to the patient to follow-up to see how her blood glucose readings have been since increasing her Tresiba to 20 units.  Per patient her numbers are still a little elevated:  Date AM  PM  04/23 207 Fasting    04/22 277 After a meal 270  04/21 177 Fasting 327(patient ate pasta buffet)  04/20 184 Fasting 247  04/19 207 Fasting 216   I advised the patient that I would send numbers to CPP and give her a call back with updated instructions.  CPP responded back and instructed to have patient increase her insulin to 24 units daily. Patient advised and verbalized understanding.  Medications: Outpatient Encounter Medications as of 06/19/2022  Medication Sig   acetaminophen (TYLENOL) 650 MG CR tablet Take 650 mg by mouth every 8 (eight) hours as needed for pain.   albuterol (VENTOLIN HFA) 108 (90 Base) MCG/ACT inhaler INHALE 1-2 PUFFS BY MOUTH into THE lungs every 4-6 hours AS NEEDED FOR WHEEZING AND/OR SHORTNESS OF BREATH   blood glucose meter kit and supplies Dispense based on patient and insurance preference. Use daily as directed. 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   budesonide-formoterol (SYMBICORT) 80-4.5 MCG/ACT inhaler Inhale 2 puffs into the lungs 2 (two) times daily.   Dulaglutide (TRULICITY) 3 MG/0.5ML SOPN Inject 3 mg as directed once a week. For Diabetes.   estradiol (ESTRACE VAGINAL) 0.1 MG/GM vaginal cream Place 1 Applicatorful vaginally 3 (three) times a week.   glipiZIDE (GLUCOTROL XL) 10 MG 24 hr tablet TAKE ONE TABLET BY MOUTH EVERY MORNING   glucose blood (ONETOUCH VERIO) test strip Use to check blood sugars once daily as instructed   insulin glargine (LANTUS SOLOSTAR) 100 UNIT/ML Solostar Pen Inject 10-50 Units into the skin daily. Titrate as directed.   Insulin Pen Needle (BD PEN NEEDLE NANO U/F) 32G X 4 MM MISC Use to inject insulin daily as  directed   losartan (COZAAR) 100 MG tablet Take 1 tablet (100 mg total) by mouth daily.   montelukast (SINGULAIR) 10 MG tablet TAKE ONE TABLET BY MOUTH EVERYDAY AT BEDTIME   Multiple Vitamin (MULTIVITAMIN WITH MINERALS) TABS tablet Take 1 tablet by mouth daily. Centrum Silver Women's 50+   NON FORMULARY Take 1 capsule by mouth in the morning and at bedtime. AZO Bladder Control Supplement   OneTouch Delica Lancets 33G MISC Use to check blood sugars once daily as directed.   oxybutynin (DITROPAN-XL) 10 MG 24 hr tablet TAKE ONE TABLET BY MOUTH EVERYDAY AT BEDTIME   propranolol ER (INDERAL LA) 60 MG 24 hr capsule TAKE ONE CAPSULE BY MOUTH ONCE DAILY   Rimegepant Sulfate (NURTEC) 75 MG TBDP Take 1 mg by mouth daily as needed. At onset of migraine/aura (Patient not taking: Reported on 05/15/2022)   rosuvastatin (CRESTOR) 5 MG tablet TAKE ONE TABLET BY MOUTH ONCE DAILY   No facility-administered encounter medications on file as of 06/19/2022.    [name], CPA/CMA Clinical Pharmacist Assistant Phone: [phone]

## 2022-06-27 ENCOUNTER — Telehealth: Payer: Self-pay

## 2022-06-27 NOTE — Progress Notes (Signed)
Care Coordination Pharmacy Assistant   Name: Joanna Bishop  MRN: [id number] DOB: 1956/02/26  Reason for Encounter: Diabetes glucose number follow-up/Medication Management    I reached out to the patient to follow-up to see how her blood glucose readings have been since increasing her Tresiba to 24 units.   Per patient her numbers are still a little elevated:   Date AM  PM  04/24 284 After a meal 206  04/25 190 Fasting 220  04/26 158 Fasting 327  04/27 172 Fasting 429  04/28 231 Fasting 248  04/29 182 Fasting 150  04/30 142 Fasting 284  05/01 194 After Meal    Per patient she still has some Tresiba left and she is aware once she completes the [place] she will switch to the Lantus the same dose as the [place]. Patient verbalized understanding.  CPP has been advised of the above information.  Per CPP he would like the patient to increase her Tresiba to 28 units daily, and he will follow-up with her next week. Per CPP he advised that her average blood sugars are 179 which is getting closer to her goal of less than 150.   Patient informed of the above and verbalized understanding.   [name], CPA/CMA Clinical Pharmacist Assistant Phone: [phone]

## 2022-07-02 ENCOUNTER — Other Ambulatory Visit: Payer: Self-pay | Admitting: Physician Assistant

## 2022-07-02 DIAGNOSIS — J301 Allergic rhinitis due to pollen: Secondary | ICD-10-CM

## 2022-07-02 DIAGNOSIS — E1159 Type 2 diabetes mellitus with other circulatory complications: Secondary | ICD-10-CM

## 2022-07-03 ENCOUNTER — Ambulatory Visit: Payer: Medicare HMO

## 2022-07-03 DIAGNOSIS — N1831 Chronic kidney disease, stage 3a: Secondary | ICD-10-CM

## 2022-07-03 NOTE — Progress Notes (Signed)
Care Management & Coordination Services Pharmacy Note  07/03/2022 Name:  Joanna Bishop MRN:  [id number] DOB:  January 18, 1956  Summary: Patient presents for follow-up consult.   -Patient reports she experiences a burning sensation with Lantus. We discussed keeping used pens at room temperature, and trying different injection sites to see if sensation improves. If still intolerable will switch back to [place].   Recommendations/Changes made from today's visit: -INCREASE Lantus to 30 units nightly   Follow up plan: -HC to check on BG weekly -CPP follow-up one month  Subjective: Joanna Bishop is an 67 y.o. year old female who is a primary patient of Ok Edwards, Lillia Abed, [place].  The care coordination team was consulted for assistance with disease management and care coordination needs.    Engaged with patient by telephone for follow up visit.  Recent office visits: 05/02/22: Patient presented to Merdis Delay, PA-C for follow-up.   Recent consult visits: 05/15/22: Patient presented to Alfredia Ferguson, PA-C for follow-up. A1c 8.3%. Fluconazole.  Hospital visits: 05/06/22: Patient hospitalized for sepsis.   Objective:  Lab Results  Component Value Date   CREATININE 1.17 (H) 09/06/2021   BUN 23 09/06/2021   EGFR 52 (L) 09/06/2021   GFRNONAA 60 (L) 03/09/2021   GFRAA >60 06/25/2019   NA 137 09/06/2021   K 4.5 09/06/2021   CALCIUM 9.5 09/06/2021   CO2 19 (L) 09/06/2021   GLUCOSE 159 (H) 09/06/2021    Lab Results  Component Value Date/Time   HGBA1C 8.3 (H) 05/15/2022 02:12 PM   HGBA1C 6.0 (A) 09/06/2021 09:45 AM   HGBA1C 13.8 (A) 01/12/2021 01:33 PM   HGBA1C 8.8 (H) 03/12/2018 09:32 AM    Last diabetic Eye exam:  Lab Results  Component Value Date/Time   HMDIABEYEEXA Retinopathy (A) 05/30/2022 12:00 AM    Last diabetic Foot exam: No results found for: "HMDIABFOOTEX"   Lab Results  Component Value Date   CHOL 129 09/06/2021   HDL 36 (L) 09/06/2021   LDLCALC 49 09/06/2021    TRIG 288 (H) 09/06/2021   CHOLHDL 3.6 09/06/2021       Latest Ref Rng & Units 09/06/2021   10:13 AM 03/09/2021    6:49 PM 01/26/2021   10:18 AM  Hepatic Function  Total Protein 6.0 - 8.5 g/dL 6.7  7.4  6.5   Albumin 3.9 - 4.9 g/dL 4.3  4.5  4.5   AST 0 - 40 IU/L 21  30  30    ALT 0 - 32 IU/L 25  33  38   Alk Phosphatase 44 - 121 IU/L 84  90  141   Total Bilirubin 0.0 - 1.2 mg/dL 0.7  1.2  0.7     Lab Results  Component Value Date/Time   TSH 2.510 10/14/2015 11:41 AM   TSH 3.030 07/12/2015 07:23 PM   TSH 1.730 09/13/2014 10:26 AM       Latest Ref Rng & Units 09/06/2021   10:13 AM 05/02/2021    2:33 PM 03/09/2021    6:49 PM  CBC  WBC 3.4 - 10.8 x10E3/uL 11.0  11.8  11.4   Hemoglobin 11.1 - 15.9 g/dL 40.9  81.1  91.4   Hematocrit 34.0 - 46.6 % 39.6  41.1  39.2   Platelets 150 - 450 x10E3/uL 243  216  257     Lab Results  Component Value Date/Time   VD25OH 29.0 (L) 10/14/2015 11:41 AM    Clinical ASCVD: No  The ASCVD Risk score (Arnett DK, et al.,  2019) failed to calculate for the following reasons:   The valid total cholesterol range is 130 to 320 mg/dL       09/26/1912    7:82 PM 05/02/2022   10:43 AM 03/12/2022    9:00 AM  Depression screen PHQ 2/9  Decreased Interest 2 1 0  Down, Depressed, Hopeless 2 1 0  PHQ - 2 Score 4 2 0  Altered sleeping 3 1 0  Tired, decreased energy 3 1 0  Change in appetite 2 0 0  Feeling bad or failure about yourself  2 1 0  Trouble concentrating 2 1 0  Moving slowly or fidgety/restless 1 0 0  Suicidal thoughts 1 0 0  PHQ-9 Score 18 6 0  Difficult doing work/chores Very difficult Somewhat difficult Not difficult at all     Social History   Tobacco Use  Smoking Status Never  Smokeless Tobacco Never   BP Readings from Last 3 Encounters:  05/28/22 120/78  05/15/22 138/75  05/02/22 138/88   Pulse Readings from Last 3 Encounters:  05/28/22 97  05/15/22 84  05/02/22 (!) 103   Wt Readings from Last 3 Encounters:  05/28/22 234  lb (106.1 kg)  05/15/22 232 lb (105.2 kg)  05/02/22 236 lb 14.4 oz (107.5 kg)   BMI Readings from Last 3 Encounters:  05/28/22 40.17 kg/m  05/15/22 39.82 kg/m  05/02/22 40.66 kg/m    Allergies  Allergen Reactions   Penicillins Swelling, Anaphylaxis and Other (See Comments)    Medications Reviewed Today     Reviewed by Erasmo Downer, MD (Physician) on 05/28/22 at 1500  Med List Status: <None>   Medication Order Taking? Sig Documenting Provider Last Dose Status Informant  acetaminophen (TYLENOL) 650 MG CR tablet [id number] No Take 650 mg by mouth every 8 (eight) hours as needed for pain. [provider] Taking Active   albuterol (VENTOLIN HFA) 108 (90 Base) MCG/ACT inhaler [id number] No INHALE 1-2 PUFFS BY MOUTH into THE lungs every 4-6 hours AS NEEDED FOR WHEEZING AND/OR SHORTNESS OF BREATH Alfredia Ferguson, PA-C Taking Active   blood glucose meter kit and supplies [id number] No Dispense based on patient and insurance preference. Use daily as directed. 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 Erasmo Downer, MD Taking Active   budesonide-formoterol [facility]) 80-4.5 MCG/ACT inhaler [id number] No Inhale 2 puffs into the lungs 2 (two) times daily. Alfredia Ferguson, PA-C Taking Active   Dulaglutide (TRULICITY) 3 MG/0.5ML SOPN [id number] No Inject 3 mg as directed once a week. For Diabetes. Alfredia Ferguson, PA-C Taking Active   estradiol (ESTRACE VAGINAL) 0.1 MG/GM vaginal cream [id number] Yes Place 1 Applicatorful vaginally 3 (three) times a week. Erasmo Downer, MD  Active   glipiZIDE (GLUCOTROL XL) 10 MG 24 hr tablet [id number] No TAKE ONE TABLET BY MOUTH EVERY MORNING Alfredia Ferguson, PA-C Taking Active   glucose blood (ONETOUCH VERIO) test strip [id number] No Use to check blood sugars once daily as instructed Erasmo Downer, MD Taking Active   insulin glargine (LANTUS SOLOSTAR) 100 UNIT/ML Solostar Pen [id number]  Inject 10-50 Units into the skin daily. Titrate as directed. Alfredia Ferguson, PA-C  Active   Insulin Pen Needle (BD PEN NEEDLE NANO U/F) 32G X 4 MM MISC [id number]  Use to inject insulin daily as directed Drubel, Lillia Abed, PA-C  Active   losartan (COZAAR) 100 MG tablet [id number] No Take 1 tablet (100 mg total) by mouth daily. Alfredia Ferguson, PA-C Taking Active   montelukast (SINGULAIR) 10 MG tablet [id number] No TAKE  ONE TABLET BY MOUTH EVERYDAY AT BEDTIME Alfredia Ferguson, PA-C Taking Active   Multiple Vitamin (MULTIVITAMIN WITH MINERALS) TABS tablet [id number] No Take 1 tablet by mouth daily. Centrum Silver Women's 50+ [provider] Taking Active   NON FORMULARY [id number] No Take 1 capsule by mouth in the morning and at bedtime. AZO Bladder Control Supplement [provider] Taking Active   OneTouch Delica Lancets 33G MISC [id number] No Use to check blood sugars once daily as directed. Erasmo Downer, MD Taking Active   oxybutynin (DITROPAN-XL) 10 MG 24 hr tablet [id number] No TAKE ONE TABLET BY MOUTH EVERYDAY AT BEDTIME Alfredia Ferguson, PA-C Taking Active   propranolol ER (INDERAL LA) 60 MG 24 hr capsule [id number] No TAKE ONE CAPSULE BY MOUTH ONCE DAILY Alfredia Ferguson, PA-C Taking Active   Rimegepant Sulfate (NURTEC) 75 MG TBDP [id number] No Take 1 mg by mouth daily as needed. At onset of migraine/aura  Patient not taking: Reported on 05/15/2022   Alfredia Ferguson, PA-C Not Taking Active   rosuvastatin (CRESTOR) 5 MG tablet [id number] No TAKE ONE TABLET BY MOUTH ONCE DAILY Alfredia Ferguson, PA-C Taking Active   sulfamethoxazole-trimethoprim (BACTRIM DS) 800-160 MG tablet [id number] Yes Take 1 tablet by mouth 2 (two) times daily for 5 days. Erasmo Downer, MD  Active             SDOH:  (Social Determinants of Health) assessments and interventions performed: Yes SDOH Interventions    Flowsheet Row Telephone from 05/11/2022 in [facility] Coordination Clinical Support from 03/12/2022 in [facility]  Family Practice Chronic Care Management from 05/15/2021 in [facility] Chronic Care Management from 03/20/2021 in [facility] Clinical Support from 03/08/2021 in [facility] Chronic Care Management from 02/21/2021 in [facility]  SDOH Interventions        Food Insecurity Interventions Intervention Not Indicated Intervention Not Indicated -- -- Intervention Not Indicated --  Housing Interventions Intervention Not Indicated Intervention Not Indicated -- -- -- --  Transportation Interventions -- Intervention Not Indicated -- -- Intervention Not Indicated --  Utilities Interventions -- Intervention Not Indicated -- -- -- --  Alcohol Usage Interventions -- Intervention Not Indicated (Score <7) -- -- -- --  Financial Strain Interventions -- Intervention Not Indicated Intervention Not Indicated Intervention Not Indicated Intervention Not Indicated Intervention Not Indicated  Physical Activity Interventions -- Intervention Not Indicated -- -- Intervention Not Indicated --  Stress Interventions -- Intervention Not Indicated -- -- Intervention Not Indicated --  Social Connections Interventions -- Intervention Not Indicated -- -- Intervention Not Indicated --       Medication Assistance: None required.  Patient affirms current coverage meets needs.  Medication Access: Within the past 30 days, how often has patient missed a dose of medication? None Is a pillbox or other method used to improve adherence? Yes  Factors that may affect medication adherence? no barriers identified Are meds synced by current pharmacy? Yes  Are meds delivered by current pharmacy? Yes  Does patient experience delays in picking up medications due to transportation concerns? No   [facility] Reviewed: Is patient disadvantaged to use [facility]?: No  Current Rx insurance plan: Dealer Name and  location of Current pharmacy:  [facility] - [place], [place] - [address] [place] [address] Phone: [phone] Fax: [phone]  [facility] - [place], [place] -  [address] [facility] [address] [place] [address] Phone: [phone] Fax: [phone]  [id number], [place] - [address] AT [facility] [address] [place] [address] Phone: [phone] Fax: [phone]  Reason patient declined to change pharmacies: Patient is already actively enrolled with [facility]  Compliance/Adherence/Medication fill history: Care Gaps: Covid Vaccine  HIV Screening  Shingrix  Pneumonia  Foot Exam  Influenza   Star-Rating Drugs: Rosuvastatin 5 mg: LF 04/10/22 for 30-DS from [facility]   Losartan 100 mg: LF 04/10/22 for 30-DS from [facility]   Glipizide 10 mg: LF 04/10/22 for 30-DS from [facility]  Assessment/Plan  Hypertension (BP goal <130/80) -Uncontrolled:  -Current treatment: Losartan 100 mg daily: Appropriate, Query effective  Propranolol ER 60 mg daily: Appropriate, Query effective   -Medications previously tried:  -Current home readings:  137/88 124/82 134/97 144/90 163/90  -Denies hypotensive/hypertensive symptoms -Continue current medications   Hyperlipidemia: (LDL goal < 70) -Controlled: Not addressed during this visit. -Current treatment: Rosuvastatin 5 mg daily (AM) -Medications previously tried: NA  -Recommended to continue current medication  Diabetes (A1c goal <7%) -Uncontrolled -Current medications: Glipizide XL 10 mg daily: Appropriate, Query effective  Trulicity 3 mg weekly: Appropriate, Query effective Lantus 28 units daily (0.26 u/kg): Appropriate, Query effective  -Medications previously tried: Metformin (sluggish, stomach pain), finerenone (cost)   -Current home glucose readings   Before Breakfast After breakfast Before Supper After Supper  2-May 189     3-May 142  253   4-May  160    5-May  188  230  6-May 135   316  7-May 140     Average 152 174 253 273  -Fasting blood sugars improved, but still reports significant prandial excursions in the afternoon/evening.  -Reports hyperglycemia symptoms: blurry vision. She has follow-up with her eye doctor in the next month.  -Current meal patterns: working on cutting down on bread and pasta, as well as sweets. She wants to eat more fruits and vegetables but states that fresh produce is often too expensive.   -Current exercise: 10-15 minutes exercises daily and plan to starts silver group exercise classes once weekly and chair exercises.  -Patient reports she experiences a burning sensation with Lantus. We discussed keeping used pens at room temperature, and trying different injection sites to see if sensation improves. If still intolerable will switch back to [place].  -Plan to increase Trulicity to 4.5 mg weekly, but will wait for patient to utilize current supply of 3 mg pens. She has 2 months on hand.  -INCREASE Lantus to 30 units nightly    Asthma (Goal: control symptoms and prevent exacerbations) -Improved -Current treatment  Ventolin HFA 1-2 puffs every 4-6 hours as needed Symbicort 80-4.5 2 puffs twice daily  Montelukast 10 mg daily  -Medications previously tried: NA  -Exacerbations requiring treatment in last 6 months: None -Patient reports consistent use of maintenance inhaler.  -Continue current medications   Bipolar Disorder (Goal: Achieve symptom remission) -Not ideally controlled: Not addressed during this visit. -Current treatment: None  -Medications previously tried/failed: Divalproex, Benztropine, Wellbutrin, Citalopram, Sertraline  -Counseled on importance of keeping upcoming psychiatry appointment   Migraines (Goal: Prevent migraines) -Controlled -Current treatment  Propanolol ER 60 mg daily   Rimegepant 75 mg daily as needed: Appropriate, Query effective  -Medications previously tried: Divalproex, Excedrin (GI Bleed) -Continue current medications   Chronic Pain (Goal: Minimize symptoms) -Controlled: Not addressed during  this visit. -Managed by Altamese Cabal, PA-C (Ortho) -Current treatment  Baclofen 10 mg twice daily as needed  Boswellia extract 500 mg twice daily  Tramadol 50 mg q6h PRN  -Medications previously tried: NA -Requires rare use of tramadol   -Recommended to continue current medication  Overactive Bladder (Goal: Minimize symptoms) -Controlled -Current treatment  Oxybutynin XL 10 mg daily  -Medications previously tried: NA -Excellent symptomatic control.   -Recommended to continue current medication  Follow Up Plan: Telephone follow up appointment with care management team member scheduled for: 07/31/2022 at 10:00 AM  Angelena Sole, PharmD, Patsy [place], CPP  Clinical Pharmacist Practitioner  [facility] [phone]

## 2022-07-04 ENCOUNTER — Telehealth: Payer: Self-pay

## 2022-07-04 NOTE — Progress Notes (Signed)
Care Management & Coordination Services Pharmacy Team Pharmacy Assistant   Name: Joanna Bishop  MRN: [id number] DOB: [birth year]   Reason for Encounter: Medication Coordination and Delivery  Contacted patient to discuss medications and coordinate delivery from [facility]. Spoke with patient on 07/04/2022   Chart review: Recent office visits:  None ID  Recent consult visits:  None ID  Hospital visits:  None in previous 6 months  Medications: Outpatient Encounter Medications as of 07/04/2022  Medication Sig Note   acetaminophen (TYLENOL) 650 MG CR tablet Take 650 mg by mouth every 8 (eight) hours as needed for pain.    albuterol (VENTOLIN HFA) 108 (90 Base) MCG/ACT inhaler INHALE 1-2 PUFFS BY MOUTH into THE lungs every 4-6 hours AS NEEDED FOR WHEEZING AND/OR SHORTNESS OF BREATH    blood glucose meter kit and supplies Dispense based on patient and insurance preference. Use daily as directed. 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    budesonide-formoterol (SYMBICORT) 80-4.5 MCG/ACT inhaler Inhale 2 puffs into the lungs 2 (two) times daily.    Dulaglutide (TRULICITY) 3 MG/0.5ML SOPN Inject 3 mg as directed once a week. For Diabetes.    estradiol (ESTRACE VAGINAL) 0.1 MG/GM vaginal cream Place 1 Applicatorful vaginally 3 (three) times a week.    glipiZIDE (GLUCOTROL XL) 10 MG 24 hr tablet TAKE ONE TABLET BY MOUTH EVERY MORNING    glucose blood (ONETOUCH VERIO) test strip Use to check blood sugars once daily as instructed    insulin glargine (LANTUS SOLOSTAR) 100 UNIT/ML Solostar Pen Inject 10-50 Units into the skin daily. Titrate as directed. 07/03/2022: Currently 30 units daily   Insulin Pen Needle (BD PEN NEEDLE NANO U/F) 32G X 4 MM MISC Use to inject insulin daily as directed    losartan (COZAAR) 100 MG tablet TAKE ONE TABLET BY MOUTH ONCE DAILY    montelukast (SINGULAIR) 10 MG tablet TAKE ONE TABLET BY MOUTH EVERYDAY AT BEDTIME    Multiple Vitamin (MULTIVITAMIN WITH MINERALS) TABS tablet Take 1  tablet by mouth daily. Centrum Silver Women's 50+    NON FORMULARY Take 1 capsule by mouth in the morning and at bedtime. AZO Bladder Control Supplement    OneTouch Delica Lancets 33G MISC Use to check blood sugars once daily as directed.    oxybutynin (DITROPAN-XL) 10 MG 24 hr tablet TAKE ONE TABLET BY MOUTH EVERYDAY AT BEDTIME    propranolol ER (INDERAL LA) 60 MG 24 hr capsule TAKE ONE CAPSULE BY MOUTH ONCE DAILY    Rimegepant Sulfate (NURTEC) 75 MG TBDP Take 1 mg by mouth daily as needed. At onset of migraine/aura (Patient not taking: Reported on 05/15/2022)    rosuvastatin (CRESTOR) 5 MG tablet TAKE ONE TABLET BY MOUTH ONCE DAILY    No facility-administered encounter medications on file as of 07/04/2022.   BP Readings from Last 3 Encounters:  05/28/22 120/78  05/15/22 138/75  05/02/22 138/88    Pulse Readings from Last 3 Encounters:  05/28/22 97  05/15/22 84  05/02/22 (!) 103    Lab Results  Component Value Date/Time   HGBA1C 8.3 (H) 05/15/2022 02:12 PM   HGBA1C 6.0 (A) 09/06/2021 09:45 AM   HGBA1C 13.8 (A) 01/12/2021 01:33 PM   HGBA1C 8.8 (H) 03/12/2018 09:32 AM   Lab Results  Component Value Date   CREATININE 1.17 (H) 09/06/2021   BUN 23 09/06/2021   GFRNONAA 60 (L) 03/09/2021   GFRAA >60 06/25/2019   NA 137 09/06/2021   K 4.5 09/06/2021   CALCIUM 9.5 09/06/2021  CO2 19 (L) 09/06/2021   Cycle dispensing form sent to Joanna Bishop, CTL for review.   Last adherence delivery date: 06/13/2022      Patient is due for next adherence delivery on: 07/13/2022 will ship via [employer] and arrive on 05/18  This delivery to include: Vials  30 Days  Losartan 50 mg 1 tablet daily (Breakfast) Glipizide 10 mg 1 tablet daily (Breakfast) Montelukast 10 mg 1 tablet daily (Bedtime) Rosuvastatin 5 mg 1 tablet daily (Breakfast) Oxybutynin 10 mg 1 tablet daily (Bedtime) Propanolol ER 60 mg daily   Patient declined the following medications this month: OneTouch Strips - Adequate  supply-Patient will need next month OneTouch Lancets- Adequate supply-Patient will need next month Albuterol 108 Inhaler inhale 1-2 puffs every every 4-6 hours prn for wheezing or shortness of breath  Nurtec 75 mg 1 tablet as needed at onset of migraine-Patient receives this medication through [facility] Trulicity Inject 1.5 mg into the skin once a week- Patient is getting this medication through [facility] Symbicort 80-4.5 mcg Inhaler Inhale 2 puffs twice daily-Patient will be getting this medication from [facility]  No refill request needed.  Confirmed delivery date of 07/14/2022 since being delivered by [employer], advised patient that pharmacy will contact them the morning of delivery.  Any concerns about your medications? No  How often do you forget or accidentally miss a dose? Never  Do you use a pillbox? Yes   [name], CPA/CMA Clinical Pharmacist Assistant Phone: [phone]

## 2022-07-11 ENCOUNTER — Telehealth: Payer: Self-pay

## 2022-07-11 NOTE — Progress Notes (Signed)
Care Coordination Pharmacy Assistant   Name: Joanna Bishop  MRN: [id number] DOB: Nov 16, 1955  Reason for Encounter: Diabetes   I called the patient in order to follow-up with her regarding her blood glucose numbers and side effect from Lantus.   I spoke with the patient and she reports that her numbers are still up and down. Patient also stated that she is no longer having a burning sensation with her Lantus.  Patient provided me with her most recent numbers:  DATE AM PM  07/04/2022 170 Fasting 150  07/05/2022 150 Fasting 224 after a meal  07/06/2022 188 after a meal Patient did not take  07/07/2022 Patient did not take Patient did not take  07/08/2022 239 after a meal 154  07/09/2022 151  after a meal 300 late dinner not feeling well  07/10/2022 159 after a meal 309 after a meal   Patient reports that she was unable to check her readings today as her glucose meter was knocked under her bed and she is waiting on her granddaughter to come home to get it from under the bed for her. Patient does report she is currently taking 30 units of Lantus nightly.  CPP notified.  After review of the numbers CPP instructs that he would like the patient to increase her Lantus to 34 units nightly.  I spoke with the patient and informed her to increase Lantus to 34 units daily and that I would check back in with her next week. I also encouraged patient to give me a call if any more issues arise with her Lantus or if the burning returns. Patient verbalized understanding.  Medications: Outpatient Encounter Medications as of 07/11/2022  Medication Sig Note   acetaminophen (TYLENOL) 650 MG CR tablet Take 650 mg by mouth every 8 (eight) hours as needed for pain.    albuterol (VENTOLIN HFA) 108 (90 Base) MCG/ACT inhaler INHALE 1-2 PUFFS BY MOUTH into THE lungs every 4-6 hours AS NEEDED FOR WHEEZING AND/OR SHORTNESS OF BREATH    blood glucose meter kit and supplies Dispense based on patient and insurance  preference. Use daily as directed. 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    budesonide-formoterol (SYMBICORT) 80-4.5 MCG/ACT inhaler Inhale 2 puffs into the lungs 2 (two) times daily.    Dulaglutide (TRULICITY) 3 MG/0.5ML SOPN Inject 3 mg as directed once a week. For Diabetes.    estradiol (ESTRACE VAGINAL) 0.1 MG/GM vaginal cream Place 1 Applicatorful vaginally 3 (three) times a week.    glipiZIDE (GLUCOTROL XL) 10 MG 24 hr tablet TAKE ONE TABLET BY MOUTH EVERY MORNING    glucose blood (ONETOUCH VERIO) test strip Use to check blood sugars once daily as instructed    insulin glargine (LANTUS SOLOSTAR) 100 UNIT/ML Solostar Pen Inject 10-50 Units into the skin daily. Titrate as directed. 07/03/2022: Currently 30 units daily   Insulin Pen Needle (BD PEN NEEDLE NANO U/F) 32G X 4 MM MISC Use to inject insulin daily as directed    losartan (COZAAR) 100 MG tablet TAKE ONE TABLET BY MOUTH ONCE DAILY    montelukast (SINGULAIR) 10 MG tablet TAKE ONE TABLET BY MOUTH EVERYDAY AT BEDTIME    Multiple Vitamin (MULTIVITAMIN WITH MINERALS) TABS tablet Take 1 tablet by mouth daily. Centrum Silver Women's 50+    NON FORMULARY Take 1 capsule by mouth in the morning and at bedtime. AZO Bladder Control Supplement    OneTouch Delica Lancets 33G MISC Use to check blood sugars once daily as directed.    oxybutynin (DITROPAN-XL)  10 MG 24 hr tablet TAKE ONE TABLET BY MOUTH EVERYDAY AT BEDTIME    propranolol ER (INDERAL LA) 60 MG 24 hr capsule TAKE ONE CAPSULE BY MOUTH ONCE DAILY    Rimegepant Sulfate (NURTEC) 75 MG TBDP Take 1 mg by mouth daily as needed. At onset of migraine/aura (Patient not taking: Reported on 05/15/2022)    rosuvastatin (CRESTOR) 5 MG tablet TAKE ONE TABLET BY MOUTH ONCE DAILY    No facility-administered encounter medications on file as of 07/11/2022.   [name], CPA/CMA Clinical Pharmacist Assistant Phone: [phone]

## 2022-07-16 ENCOUNTER — Telehealth: Payer: Medicare HMO | Admitting: Family Medicine

## 2022-07-16 DIAGNOSIS — R300 Dysuria: Secondary | ICD-10-CM

## 2022-07-16 DIAGNOSIS — R509 Fever, unspecified: Secondary | ICD-10-CM

## 2022-07-16 NOTE — Progress Notes (Signed)
[place]  Recurrent UTI's with recent hospitalization in Feb with urosepsis. Has fever of 100.9-101 (took Tylenol- has improved now) with UTI symptoms, she is encouraged to go be seen in person given recent history and higher risk of infection systematically.  Patient acknowledged agreement and understanding of the plan.

## 2022-07-17 ENCOUNTER — Ambulatory Visit: Payer: Medicare HMO | Admitting: Physician Assistant

## 2022-07-17 ENCOUNTER — Ambulatory Visit
Admission: EM | Admit: 2022-07-17 | Discharge: 2022-07-17 | Disposition: A | Discharge status: Other Acute Inpatient Hospital | Payer: Medicare HMO | Attending: Emergency Medicine | Admitting: Emergency Medicine

## 2022-07-17 DIAGNOSIS — Z880 Allergy status to penicillin: Secondary | ICD-10-CM | POA: Diagnosis not present

## 2022-07-17 DIAGNOSIS — Z79899 Other long term (current) drug therapy: Secondary | ICD-10-CM | POA: Diagnosis not present

## 2022-07-17 DIAGNOSIS — N390 Urinary tract infection, site not specified: Secondary | ICD-10-CM

## 2022-07-17 DIAGNOSIS — R300 Dysuria: Secondary | ICD-10-CM | POA: Diagnosis not present

## 2022-07-17 DIAGNOSIS — R3989 Other symptoms and signs involving the genitourinary system: Secondary | ICD-10-CM | POA: Diagnosis not present

## 2022-07-17 LAB — URINALYSIS, ROUTINE W REFLEX MICROSCOPIC
Glucose, UA: NEGATIVE mg/dL
Nitrite: NEGATIVE
Protein, ur: 100 mg/dL — AB
Specific Gravity, Urine: 1.02 (ref 1.005–1.030)
pH: 5.5 (ref 5.0–8.0)

## 2022-07-17 LAB — GLUCOSE, CAPILLARY: Glucose-Capillary: 204 mg/dL — ABNORMAL HIGH (ref 70–99)

## 2022-07-17 LAB — URINALYSIS, MICROSCOPIC (REFLEX): WBC, UA: >50 WBC/hpf (ref 0–5)

## 2022-07-17 MED ORDER — ONDANSETRON 4 MG PO TBDP
4.0000 mg | ORAL_TABLET | Freq: Once | ORAL | Status: AC
  Administered 2022-07-17: 4 mg via ORAL

## 2022-07-17 MED ORDER — ACETAMINOPHEN 325 MG PO TABS
975.0000 mg | ORAL_TABLET | Freq: Once | ORAL | Status: AC
  Administered 2022-07-17: 975 mg via ORAL

## 2022-07-17 MED ORDER — ACETAMINOPHEN 500 MG PO TABS: 1000.0000 mg | ORAL_TABLET | Freq: Once | ORAL | Status: DC

## 2022-07-17 NOTE — ED Notes (Signed)
Patient is being discharged from the Urgent Care and sent to the Emergency Department via POV . Per Lattie Corns, NP, patient is in need of higher level of care due to pyelonephritis. Patient is aware and verbalizes understanding of plan of care.  Vitals:   07/17/22 1259  BP: 123/64  Pulse: (!) 110  Temp: 99.3 F (37.4 C)  SpO2: 93%

## 2022-07-17 NOTE — ED Triage Notes (Signed)
Pt is with her granddaughter  Pt c/o possible UTI x1day.   Pt states that last night she had a fever of 102.7, urinary burning, urinary frequency, vomiting.

## 2022-07-17 NOTE — Discharge Instructions (Addendum)
Please go to local ER:you need IV fluids, labs, may need imaging to rule out kidney stone,urosepsis,pyelonephritis with 102+ fever last night,vomiting. Since you were recently admitted for urosepsis will need to most likely be readmitted again.

## 2022-07-17 NOTE — ED Provider Notes (Signed)
[facility]    CSN: [id number] Arrival date & time: 07/17/22  1202      History   Chief Complaint Chief Complaint  Patient presents with   Urinary Tract Infection    HPI Joanna Bishop is a 67 y.o. female.   67 year old female, Joanna Bishop, presents to Er with chief complaint of dysuria for a few days, fever that started last night(102+) urinary frequency, vomiting. Pt c/o bilateral flank pain.   Pt has PMH of urosepsis and was admitted 2 months prior for same.   The history is provided by the patient. No language interpreter was used.    Past Medical History:  Diagnosis Date   Allergy 02/26/1958   Anxiety    Arthritis    shoulders, legs   Asthma    Blood transfusion without reported diagnosis 05/05/2021   Depression    Diabetes mellitus without complication (HCC)    Hyperlipidemia    Migraine headache    2x/month   Osteoporosis 11/27/2010   Vertigo     Patient Active Problem List   Diagnosis Date Noted   Acute UTI (urinary tract infection) 07/17/2022   Microalbuminuria due to type 2 diabetes mellitus (HCC) 05/02/2022   Migraine with aura and without status migrainosus, not intractable 06/14/2021   Peptic ulcer disease with hemorrhage 05/12/2021   Anemia due to acute blood loss 05/12/2021   Hypertension associated with diabetes (HCC) 05/02/2021   Chronic 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 05/02/2021   OAB (overactive bladder) 01/12/2021   History of total knee arthroplasty 05/09/2020   Morbid obesity (HCC) 08/28/2019   Stiffness of right knee 06/18/2018   Type 2 diabetes mellitus with stage 3a chronic kidney disease, without long-term current use of insulin (HCC) 10/17/2015   Carpal tunnel syndrome of left wrist 10/14/2015   Bipolar depression (HCC) 07/12/2015   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   Asthmatic bronchitis 08/02/2014   Hyperlipidemia associated with type 2 diabetes mellitus (HCC) 08/02/2014   History of  suicidal ideation 08/02/2014   Avitaminosis D 08/02/2014   Generalized hyperhidrosis 08/02/2014   Airway hyperreactivity 08/02/2014   Anxiety disorder 08/02/2014   Insomnia 08/02/2014   Major depressive disorder, single episode 08/02/2014   Shortness of breath 08/05/2009    Past Surgical History:  Procedure Laterality Date   ABDOMINAL HYSTERECTOMY     ARTHROPLASTY     CHOLECYSTECTOMY     FEMUR FRACTURE SURGERY     JOINT REPLACEMENT  12/16/2010   TOTAL KNEE ARTHROPLASTY Left    TUBAL LIGATION  10/08/1986    OB History   No obstetric history on file.      Home Medications    Prior to Admission medications   Medication Sig Start Date End Date Taking? Authorizing Provider  acetaminophen (TYLENOL) 650 MG CR tablet Take 650 mg by mouth every 8 (eight) hours as needed for pain.   Yes [provider]  albuterol (VENTOLIN HFA) 108 (90 Base) MCG/ACT inhaler INHALE 1-2 PUFFS BY MOUTH into THE lungs every 4-6 hours AS NEEDED FOR WHEEZING AND/OR SHORTNESS OF BREATH 11/06/21  Yes Drubel, Lillia Abed, PA-C  blood glucose meter kit and supplies Dispense based on patient and insurance preference. Use daily as directed. 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 01/23/21  Yes Bacigalupo, Marzella Schlein, MD  budesonide-formoterol [facility]) 80-4.5 MCG/ACT inhaler Inhale 2 puffs into the lungs 2 (two) times daily. 08/28/21  Yes Drubel, Lillia Abed, PA-C  Dulaglutide (TRULICITY) 3 MG/0.5ML SOPN Inject 3 mg as  directed once a week. For Diabetes. 01/26/22  Yes Drubel, Lillia Abed, PA-C  estradiol (ESTRACE VAGINAL) 0.1 MG/GM vaginal cream Place 1 Applicatorful vaginally 3 (three) times a week. 05/28/22  Yes Bacigalupo, Marzella Schlein, MD  glipiZIDE (GLUCOTROL XL) 10 MG 24 hr tablet TAKE ONE TABLET BY MOUTH EVERY MORNING 04/02/22  Yes Drubel, Lillia Abed, PA-C  glucose blood (ONETOUCH VERIO) test strip Use to check blood sugars once daily as instructed 02/07/21  Yes Bacigalupo, Marzella Schlein, MD  insulin glargine (LANTUS SOLOSTAR) 100 UNIT/ML Solostar  Pen Inject 10-50 Units into the skin daily. Titrate as directed. 05/28/22  Yes Alfredia Ferguson, PA-C  Insulin Pen Needle (BD PEN NEEDLE NANO U/F) 32G X 4 MM MISC Use to inject insulin daily as directed 05/28/22  Yes Drubel, Lillia Abed, PA-C  losartan (COZAAR) 100 MG tablet TAKE ONE TABLET BY MOUTH ONCE DAILY 07/02/22  Yes Drubel, Lillia Abed, PA-C  montelukast (SINGULAIR) 10 MG tablet TAKE ONE TABLET BY MOUTH EVERYDAY AT BEDTIME 07/02/22  Yes Drubel, Lillia Abed, PA-C  Multiple Vitamin (MULTIVITAMIN WITH MINERALS) TABS tablet Take 1 tablet by mouth daily. Centrum Silver Women's 50+   Yes [provider]  NON FORMULARY Take 1 capsule by mouth in the morning and at bedtime. AZO Bladder Control Supplement   Yes [provider]  OneTouch Delica Lancets 33G MISC Use to check blood sugars once daily as directed. 02/07/21  Yes Bacigalupo, Marzella Schlein, MD  oxybutynin (DITROPAN-XL) 10 MG 24 hr tablet TAKE ONE TABLET BY MOUTH EVERYDAY AT BEDTIME 07/02/22  Yes Drubel, Lillia Abed, PA-C  propranolol ER (INDERAL LA) 60 MG 24 hr capsule TAKE ONE CAPSULE BY MOUTH ONCE DAILY 03/05/22  Yes Drubel, Lillia Abed, PA-C  Rimegepant Sulfate (NURTEC) 75 MG TBDP Take 1 mg by mouth daily as needed. At onset of migraine/aura 07/11/21  Yes Drubel, Lillia Abed, PA-C  rosuvastatin (CRESTOR) 5 MG tablet TAKE ONE TABLET BY MOUTH ONCE DAILY 07/02/22  Yes Alfredia Ferguson, PA-C    Family History Family History  Problem Relation Age of Onset   Diabetes Mother    COPD Mother    Diabetes Father    Coronary artery disease Father    Coronary artery disease Maternal Grandmother    Heart attack Maternal Grandmother    Diabetes Paternal Grandmother     Social History Social History   Tobacco Use   Smoking status: Never   Smokeless tobacco: Never  Vaping Use   Vaping Use: Never used  Substance Use Topics   Alcohol use: Not Currently    Comment: Maybe 6 beers a year   Drug use: Never     Allergies   Penicillins   Review of Systems Review  of Systems  Constitutional:  Positive for fever.  Genitourinary:  Positive for dysuria, flank pain and frequency.  All other systems reviewed and are negative.    Physical Exam Triage Vital Signs ED Triage Vitals  Enc Vitals Group     BP 07/17/22 1259 123/64     Pulse Rate 07/17/22 1259 (!) 110     Resp --      Temp 07/17/22 1259 99.3 F (37.4 C)     Temp Source 07/17/22 1259 Oral     SpO2 07/17/22 1259 93 %     Weight 07/17/22 1257 201 lb (91.2 kg)     Height 07/17/22 1257 5\' 4"  (1.626 m)     Head Circumference --      Peak Flow --      Pain Score 07/17/22 1256 10  Pain Loc --      Pain Edu? --      Excl. in GC? --    No data found.  Updated Vital Signs BP 123/64 (BP Location: Left Arm)   Pulse (!) 110   Temp 99.3 F (37.4 C) (Oral)   Ht 5\' 4"  (1.626 m)   Wt 201 lb (91.2 kg)   SpO2 93%   BMI 34.50 kg/m   Visual Acuity Right Eye Distance:   Left Eye Distance:   Bilateral Distance:    Right Eye Near:   Left Eye Near:    Bilateral Near:     Physical Exam Vitals and nursing note reviewed.  Constitutional:      General: She is not in acute distress.    Appearance: She is well-developed and well-groomed.  HENT:     Head: Normocephalic and atraumatic.  Eyes:     Conjunctiva/sclera: Conjunctivae normal.  Cardiovascular:     Rate and Rhythm: Regular rhythm. Tachycardia present.     Pulses: Normal pulses.     Heart sounds: Normal heart sounds. No murmur heard. Pulmonary:     Effort: Pulmonary effort is normal. No respiratory distress.     Breath sounds: Normal breath sounds.  Abdominal:     Palpations: Abdomen is soft.     Tenderness: There is abdominal tenderness in the suprapubic area.     Comments: Bilateral CVAT tenderness  Musculoskeletal:        General: No swelling.     Cervical back: Neck supple.  Skin:    General: Skin is warm and dry.     Capillary Refill: Capillary refill takes less than 2 seconds.  Neurological:     General: No focal  deficit present.     Mental Status: She is alert and oriented to person, place, and time.     GCS: GCS eye subscore is 4. GCS verbal subscore is 5. GCS motor subscore is 6.  Psychiatric:        Attention and Perception: Attention normal.        Mood and Affect: Mood normal.        Speech: Speech normal.        Behavior: Behavior normal. Behavior is cooperative.      UC Treatments / Results  Labs (all labs ordered are listed, but only abnormal results are displayed) Labs Reviewed  URINALYSIS, ROUTINE W REFLEX MICROSCOPIC - Abnormal; Notable for the following components:      Result Value   APPearance HAZY (*)    Hgb urine dipstick TRACE (*)    Bilirubin Urine SMALL (*)    Ketones, ur TRACE (*)    Protein, ur 100 (*)    Leukocytes,Ua SMALL (*)    All other components within normal limits  GLUCOSE, CAPILLARY - Abnormal; Notable for the following components:   Glucose-Capillary 204 (*)    All other components within normal limits  URINALYSIS, MICROSCOPIC (REFLEX) - Abnormal; Notable for the following components:   Bacteria, UA MANY (*)    All other components within normal limits  CBG MONITORING, ED    EKG   Radiology No results found.  Procedures Procedures (including critical care time)  Medications Ordered in UC Medications  ondansetron (ZOFRAN-ODT) disintegrating tablet 4 mg (4 mg Oral Given 07/17/22 1332)  acetaminophen (TYLENOL) tablet 975 mg (975 mg Oral Given 07/17/22 1336)    Initial Impression / Assessment and Plan / UC Course  I have reviewed the triage vital signs and the  nursing notes.  Pertinent labs & imaging results that were available during my care of the patient were reviewed by me and considered in my medical decision making (see chart for details).    Discussed plan of care with pt and family member, both verbalized understanding to this provider. Tylenol and zofran given in urgent care.   Ddx: Urosepsis, pyelonephritis, recurrent  UTI,dehydration Final Clinical Impressions(s) / UC Diagnoses   Final diagnoses:  Acute UTI (urinary tract infection)     Discharge Instructions      Please go to local ER:you need IV fluids, labs, may need imaging to rule out kidney stone,urosepsis,pyelonephritis with 102+ fever last night,vomiting. Since you were recently admitted for urosepsis will need to most likely be readmitted again.    ED Prescriptions   None    PDMP not reviewed this encounter.   Clancy Gourd, NP 07/17/22 1723

## 2022-07-18 ENCOUNTER — Telehealth: Payer: Self-pay

## 2022-07-18 NOTE — Progress Notes (Signed)
Care Coordination Pharmacy Assistant   Name: AYSE SPROUSE  MRN: [id number] DOB: 12/17/1955  Reason for Encounter: Diabetes   I called the patient in order to follow-up with her regarding her blood glucose numbers since increase of Lantus to 34 units daily.    I spoke with the patient and she reports that she is feeling much better today as she did have to go to UC yesterday due to a UTI. Per patient they did give her ABX IV and she is feeling much better than she was this morning. UC also sent a prescription over to her pharmacy for ABX patient is awaiting prescription to be filled. Patient reports her numbers are doing a little better, and provided the recent update below.  DATE AM PM  07/11/2022 Did not take 296  07/12/2022 114 Fasting 241  07/13/2022 163 Fasting 306  07/14/2022 174 after breakfast 213  07/15/2022 138 Fasting 257  07/16/2019 131 Fasting Did not take  05/21 201 Fasting no medication the day before pt. Was ill 309 after a meal  07/18/2022 141 Fasting    CPP notified of the numbers, and per CPP he would like patient to increase her Lantus to 36 units daily. He did advise her morning numbers were improving but her evening numbers are still spiking. Per CPP he hopes once patient start the high dosage of Trulicity that will help as well. Left patient a detailed message per her request and instructed her to call me back if she has any questions.  Medications: Outpatient Encounter Medications as of 07/18/2022  Medication Sig Note   acetaminophen (TYLENOL) 650 MG CR tablet Take 650 mg by mouth every 8 (eight) hours as needed for pain.    albuterol (VENTOLIN HFA) 108 (90 Base) MCG/ACT inhaler INHALE 1-2 PUFFS BY MOUTH into THE lungs every 4-6 hours AS NEEDED FOR WHEEZING AND/OR SHORTNESS OF BREATH    blood glucose meter kit and supplies Dispense based on patient and insurance preference. Use daily as directed. 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    budesonide-formoterol (SYMBICORT) 80-4.5  MCG/ACT inhaler Inhale 2 puffs into the lungs 2 (two) times daily.    Dulaglutide (TRULICITY) 3 MG/0.5ML SOPN Inject 3 mg as directed once a week. For Diabetes.    estradiol (ESTRACE VAGINAL) 0.1 MG/GM vaginal cream Place 1 Applicatorful vaginally 3 (three) times a week.    glipiZIDE (GLUCOTROL XL) 10 MG 24 hr tablet TAKE ONE TABLET BY MOUTH EVERY MORNING    glucose blood (ONETOUCH VERIO) test strip Use to check blood sugars once daily as instructed    insulin glargine (LANTUS SOLOSTAR) 100 UNIT/ML Solostar Pen Inject 10-50 Units into the skin daily. Titrate as directed. 07/03/2022: Currently 30 units daily   Insulin Pen Needle (BD PEN NEEDLE NANO U/F) 32G X 4 MM MISC Use to inject insulin daily as directed    losartan (COZAAR) 100 MG tablet TAKE ONE TABLET BY MOUTH ONCE DAILY    montelukast (SINGULAIR) 10 MG tablet TAKE ONE TABLET BY MOUTH EVERYDAY AT BEDTIME    Multiple Vitamin (MULTIVITAMIN WITH MINERALS) TABS tablet Take 1 tablet by mouth daily. Centrum Silver Women's 50+    NON FORMULARY Take 1 capsule by mouth in the morning and at bedtime. AZO Bladder Control Supplement    OneTouch Delica Lancets 33G MISC Use to check blood sugars once daily as directed.    oxybutynin (DITROPAN-XL) 10 MG 24 hr tablet TAKE ONE TABLET BY MOUTH EVERYDAY AT BEDTIME    propranolol ER (INDERAL LA)  60 MG 24 hr capsule TAKE ONE CAPSULE BY MOUTH ONCE DAILY    Rimegepant Sulfate (NURTEC) 75 MG TBDP Take 1 mg by mouth daily as needed. At onset of migraine/aura    rosuvastatin (CRESTOR) 5 MG tablet TAKE ONE TABLET BY MOUTH ONCE DAILY    No facility-administered encounter medications on file as of 07/18/2022.    [name], CPA/CMA Clinical Pharmacist Assistant Phone: [phone]

## 2022-07-20 ENCOUNTER — Encounter: Payer: Self-pay | Admitting: Physician Assistant

## 2022-07-20 ENCOUNTER — Telehealth (INDEPENDENT_AMBULATORY_CARE_PROVIDER_SITE_OTHER): Payer: Medicare HMO | Admitting: Physician Assistant

## 2022-07-20 DIAGNOSIS — E162 Hypoglycemia, unspecified: Secondary | ICD-10-CM | POA: Diagnosis not present

## 2022-07-20 DIAGNOSIS — N3000 Acute cystitis without hematuria: Secondary | ICD-10-CM

## 2022-07-20 NOTE — Progress Notes (Signed)
I,Joseline E Rosas,acting as a scribe for [employer], PA-C.,have documented all relevant documentation on the behalf of Alfredia Ferguson, PA-C,as directed by  Alfredia Ferguson, PA-C while in the presence of Alfredia Ferguson, PA-C.  MyChart Audio Visit    Virtual Visit via Audio Note   This format is felt to be most appropriate for this patient at this time. Physical exam was limited by quality of the video and audio technology used for the visit.  The patient was unable to connect to the video but audio was sufficient.   Patient location: home Provider location: [facility]  I discussed the limitations of evaluation and management by telemedicine and the availability of in person appointments. The patient expressed understanding and agreed to proceed.  Patient: Joanna Bishop   DOB: [birth year]   67 y.o. Female  MRN: [id number] Visit Date: 07/20/2022  Today's healthcare provider: Alfredia Ferguson, PA-C   Chief Complaint  Patient presents with   Follow-up   Subjective    HPI  Follow up ER visit  Patient was seen in ER for Dysuria on 07/17/2022. She was treated for Cipro 500 mg BID for seven days. Side effect blood sugar to dropped to 40. Reports that she was feeling very weak, dizziness and lightheaded and SOB within the hour of taking it and then it ease off.  She reports excellent compliance with treatment. She reports this condition is Improved with medications just not the side effect that she has been having.  Pt has not taken her vitamins, waited 2 hours after glipizide and a meal before taking antibiotic.   -----------------------------------------------------------------------------------------    Medications: Outpatient Medications Prior to Visit  Medication Sig   acetaminophen (TYLENOL) 650 MG CR tablet Take 650 mg by mouth every 8 (eight) hours as needed for pain.   albuterol (VENTOLIN HFA) 108 (90 Base) MCG/ACT inhaler INHALE 1-2 PUFFS BY MOUTH into THE lungs every 4-6  hours AS NEEDED FOR WHEEZING AND/OR SHORTNESS OF BREATH   blood glucose meter kit and supplies Dispense based on patient and insurance preference. Use daily as directed. 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   budesonide-formoterol (SYMBICORT) 80-4.5 MCG/ACT inhaler Inhale 2 puffs into the lungs 2 (two) times daily.   Dulaglutide (TRULICITY) 3 MG/0.5ML SOPN Inject 3 mg as directed once a week. For Diabetes.   estradiol (ESTRACE VAGINAL) 0.1 MG/GM vaginal cream Place 1 Applicatorful vaginally 3 (three) times a week.   glipiZIDE (GLUCOTROL XL) 10 MG 24 hr tablet TAKE ONE TABLET BY MOUTH EVERY MORNING   glucose blood (ONETOUCH VERIO) test strip Use to check blood sugars once daily as instructed   insulin glargine (LANTUS SOLOSTAR) 100 UNIT/ML Solostar Pen Inject 10-50 Units into the skin daily. Titrate as directed.   Insulin Pen Needle (BD PEN NEEDLE NANO U/F) 32G X 4 MM MISC Use to inject insulin daily as directed   losartan (COZAAR) 100 MG tablet TAKE ONE TABLET BY MOUTH ONCE DAILY   montelukast (SINGULAIR) 10 MG tablet TAKE ONE TABLET BY MOUTH EVERYDAY AT BEDTIME   Multiple Vitamin (MULTIVITAMIN WITH MINERALS) TABS tablet Take 1 tablet by mouth daily. Centrum Silver Women's 50+   NON FORMULARY Take 1 capsule by mouth in the morning and at bedtime. AZO Bladder Control Supplement   OneTouch Delica Lancets 33G MISC Use to check blood sugars once daily as directed.   oxybutynin (DITROPAN-XL) 10 MG 24 hr tablet TAKE ONE TABLET BY MOUTH EVERYDAY AT BEDTIME   propranolol ER (INDERAL LA) 60 MG 24 hr  capsule TAKE ONE CAPSULE BY MOUTH ONCE DAILY   Rimegepant Sulfate (NURTEC) 75 MG TBDP Take 1 mg by mouth daily as needed. At onset of migraine/aura   rosuvastatin (CRESTOR) 5 MG tablet TAKE ONE TABLET BY MOUTH ONCE DAILY   No facility-administered medications prior to visit.       Objective    There were no vitals taken for this visit.    Physical Exam Neurological:     Mental Status: She is oriented to person,  place, and time.  Psychiatric:        Mood and Affect: Mood normal.        Behavior: Behavior normal.        Assessment & Plan     1. Acute cystitis without hematuria Cont abx therapy  2. Hypoglycemia Agree with the adjustments she has made to her meds; cont to monitor sugar If over the weekend < 80 consistently can d/c glipizide for a few days  Return if symptoms worsen or fail to improve.     I discussed the assessment and treatment plan with the patient. The patient was provided an opportunity to ask questions and all were answered. The patient agreed with the plan and demonstrated an understanding of the instructions.   The patient was advised to call back or seek an in-person evaluation if the symptoms worsen or if the condition fails to improve as anticipated.  I provided 7 minutes of non-face-to-face time during this encounter.  I, Alfredia Ferguson, PA-C have reviewed all documentation for this visit. The documentation on  07/20/22   for the exam, diagnosis, procedures, and orders are all accurate and complete.  Alfredia Ferguson, PA-C [facility] [address] [place], [place], [address] Office: [phone] Fax: [phone]   [facility]

## 2022-07-25 ENCOUNTER — Telehealth: Payer: Self-pay

## 2022-07-25 DIAGNOSIS — E1122 Type 2 diabetes mellitus with diabetic chronic kidney disease: Secondary | ICD-10-CM

## 2022-07-25 MED ORDER — LANTUS SOLOSTAR 100 UNIT/ML ~~LOC~~ SOPN
34.0000 [IU] | PEN_INJECTOR | Freq: Every day | SUBCUTANEOUS | 3 refills | Status: DC
Start: 2022-07-25 — End: 2022-07-25

## 2022-07-25 MED ORDER — LANTUS SOLOSTAR 100 UNIT/ML ~~LOC~~ SOPN
36.0000 [IU] | PEN_INJECTOR | Freq: Every day | SUBCUTANEOUS | 3 refills | Status: DC
Start: 2022-07-25 — End: 2022-08-02

## 2022-07-25 NOTE — Progress Notes (Signed)
I called the patient in order to follow-up with her regarding her blood glucose numbers since increase of Lantus to 36 units daily per clinical pharmacist request.   I spoke with the patient and she reports she just finish her last dose  of ciprofloxacin this morning. Patient reports she was having "weird side effects" as in being paranoid,anxious, and notice her blood sugar would be all out of "wack". Patient states when she would take her 1st dose of ciprofloxacin in the morning her blood sugar would drop, but then within 4 hours her blood sugar would be elevated.Patient states she is so "happy and relieved her last dose of ciprofloxacin was today". Patient states she will need a refill of her Lantus to be sent to [facility]  pharmacy today as she is running low, and would like the prescription to match the clinical pharmacist recommendation from today.Patient denies any hypoglycemia or hyperglycemia symptoms at this moment.  Patient provider most recent blood sugar readings:  Date AM PM  07/25/2022 144 After Breakfast   07/24/2022 97 Fasting 163  07/23/2022 148 After Breakfast 154  07/22/2022 147 After Breakfast 239  07/21/2022 128 Fasting 200  07/20/2022 131   After Breakfast 162  07/19/2022 83 Fasting, after eating it was 106 168   Notified Clinical pharmacist of the above.  Per Clinical pharmacist, I would like her to continue her current dose of Lantus 36 units. She should continue to monitor her blood sugar/symptoms closely if anything changes as she finishes her course of antibiotics. If she has any recurrent symptoms she should get evaluated right away. I will send in the Rx for her Lantus now.    Patient verbalized understanding and  confirm appointment with the clinical pharmacist on 07/31/2022 via phone.   Everlean Cherry Clinical Pharmacist Assistant [phone]

## 2022-07-31 ENCOUNTER — Ambulatory Visit: Payer: Medicare HMO

## 2022-07-31 DIAGNOSIS — E1122 Type 2 diabetes mellitus with diabetic chronic kidney disease: Secondary | ICD-10-CM

## 2022-07-31 DIAGNOSIS — N1831 Chronic kidney disease, stage 3a: Secondary | ICD-10-CM

## 2022-07-31 MED ORDER — TRULICITY 4.5 MG/0.5ML ~~LOC~~ SOAJ
4.5000 mg | SUBCUTANEOUS | 1 refills | Status: DC
Start: 2022-07-31 — End: 2023-02-13

## 2022-07-31 NOTE — Progress Notes (Signed)
Care Management & Coordination Services Pharmacy Note  07/31/2022 Name:  Joanna Bishop MRN:  [id number] DOB:  March 01, 1955  Summary: Patient presents for follow-up consult.   Recommendations/Changes made from today's visit: -INCREASE Trulicity to 4.5 mg weekly   Follow up plan: -Check A1c next month, will plan to provide sample of Dexcom.  -CPP follow-up one month  Subjective: Joanna Bishop is an 67 y.o. year old female who is a primary patient of Ok Edwards, Lillia Abed, [place].  The care coordination team was consulted for assistance with disease management and care coordination needs.    Engaged with patient by telephone for follow up visit.  Recent office visits: 05/02/22: Patient presented to Merdis Delay, PA-C for follow-up.   Recent consult visits: 05/15/22: Patient presented to Alfredia Ferguson, PA-C for follow-up. A1c 8.3%. Fluconazole.  Hospital visits: 05/06/22: Patient hospitalized for sepsis.   Objective:  Lab Results  Component Value Date   CREATININE 1.17 (H) 09/06/2021   BUN 23 09/06/2021   EGFR 52 (L) 09/06/2021   GFRNONAA 60 (L) 03/09/2021   GFRAA >60 06/25/2019   NA 137 09/06/2021   K 4.5 09/06/2021   CALCIUM 9.5 09/06/2021   CO2 19 (L) 09/06/2021   GLUCOSE 159 (H) 09/06/2021    Lab Results  Component Value Date/Time   HGBA1C 8.3 (H) 05/15/2022 02:12 PM   HGBA1C 6.0 (A) 09/06/2021 09:45 AM   HGBA1C 13.8 (A) 01/12/2021 01:33 PM   HGBA1C 8.8 (H) 03/12/2018 09:32 AM    Last diabetic Eye exam:  Lab Results  Component Value Date/Time   HMDIABEYEEXA Retinopathy (A) 05/30/2022 12:00 AM    Last diabetic Foot exam: No results found for: "HMDIABFOOTEX"   Lab Results  Component Value Date   CHOL 129 09/06/2021   HDL 36 (L) 09/06/2021   LDLCALC 49 09/06/2021   TRIG 288 (H) 09/06/2021   CHOLHDL 3.6 09/06/2021       Latest Ref Rng & Units 09/06/2021   10:13 AM 03/09/2021    6:49 PM 01/26/2021   10:18 AM  Hepatic Function  Total Protein 6.0 - 8.5 g/dL 6.7   7.4  6.5   Albumin 3.9 - 4.9 g/dL 4.3  4.5  4.5   AST 0 - 40 IU/L 21  30  30    ALT 0 - 32 IU/L 25  33  38   Alk Phosphatase 44 - 121 IU/L 84  90  141   Total Bilirubin 0.0 - 1.2 mg/dL 0.7  1.2  0.7     Lab Results  Component Value Date/Time   TSH 2.510 10/14/2015 11:41 AM   TSH 3.030 07/12/2015 07:23 PM   TSH 1.730 09/13/2014 10:26 AM       Latest Ref Rng & Units 09/06/2021   10:13 AM 05/02/2021    2:33 PM 03/09/2021    6:49 PM  CBC  WBC 3.4 - 10.8 x10E3/uL 11.0  11.8  11.4   Hemoglobin 11.1 - 15.9 g/dL 95.2  84.1  32.4   Hematocrit 34.0 - 46.6 % 39.6  41.1  39.2   Platelets 150 - 450 x10E3/uL 243  216  257     Lab Results  Component Value Date/Time   VD25OH 29.0 (L) 10/14/2015 11:41 AM    Clinical ASCVD: No  The ASCVD Risk score (Arnett DK, et al., 2019) failed to calculate for the following reasons:   The valid total cholesterol range is 130 to 320 mg/dL       4/0/1027    2:53  PM 05/02/2022   10:43 AM 03/12/2022    9:00 AM  Depression screen PHQ 2/9  Decreased Interest 2 1 0  Down, Depressed, Hopeless 2 1 0  PHQ - 2 Score 4 2 0  Altered sleeping 3 1 0  Tired, decreased energy 3 1 0  Change in appetite 2 0 0  Feeling bad or failure about yourself  2 1 0  Trouble concentrating 2 1 0  Moving slowly or fidgety/restless 1 0 0  Suicidal thoughts 1 0 0  PHQ-9 Score 18 6 0  Difficult doing work/chores Very difficult Somewhat difficult Not difficult at all     Social History   Tobacco Use  Smoking Status Never  Smokeless Tobacco Never   BP Readings from Last 3 Encounters:  07/17/22 123/64  05/28/22 120/78  05/15/22 138/75   Pulse Readings from Last 3 Encounters:  07/17/22 (!) 110  05/28/22 97  05/15/22 84   Wt Readings from Last 3 Encounters:  07/17/22 201 lb (91.2 kg)  05/28/22 234 lb (106.1 kg)  05/15/22 232 lb (105.2 kg)   BMI Readings from Last 3 Encounters:  07/17/22 34.50 kg/m  05/28/22 40.17 kg/m  05/15/22 39.82 kg/m    Allergies   Allergen Reactions   Penicillins Swelling, Anaphylaxis and Other (See Comments)    Medications Reviewed Today     Reviewed by Alfredia Ferguson, PA-C (Physician Assistant Certified) on 07/20/22 at 1425  Med List Status: <None>   Medication Order Taking? Sig Documenting Provider Last Dose Status Informant  acetaminophen (TYLENOL) 650 MG CR tablet [id number] Yes Take 650 mg by mouth every 8 (eight) hours as needed for pain. [provider] Taking Active   albuterol (VENTOLIN HFA) 108 (90 Base) MCG/ACT inhaler [id number] Yes INHALE 1-2 PUFFS BY MOUTH into THE lungs every 4-6 hours AS NEEDED FOR WHEEZING AND/OR SHORTNESS OF BREATH Alfredia Ferguson, PA-C Taking Active   blood glucose meter kit and supplies [id number] Yes Dispense based on patient and insurance preference. Use daily as directed. 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 Erasmo Downer, MD Taking Active   budesonide-formoterol [facility]) 80-4.5 MCG/ACT inhaler [id number] Yes Inhale 2 puffs into the lungs 2 (two) times daily. Alfredia Ferguson, PA-C Taking Active   Dulaglutide (TRULICITY) 3 MG/0.5ML SOPN [id number] Yes Inject 3 mg as directed once a week. For Diabetes. Alfredia Ferguson, PA-C Taking Active   estradiol (ESTRACE VAGINAL) 0.1 MG/GM vaginal cream [id number] Yes Place 1 Applicatorful vaginally 3 (three) times a week. Erasmo Downer, MD Taking Active   glipiZIDE (GLUCOTROL XL) 10 MG 24 hr tablet [id number] Yes TAKE ONE TABLET BY MOUTH EVERY MORNING Alfredia Ferguson, PA-C Taking Active   glucose blood (ONETOUCH VERIO) test strip [id number] Yes Use to check blood sugars once daily as instructed Erasmo Downer, MD Taking Active   insulin glargine (LANTUS SOLOSTAR) 100 UNIT/ML Solostar Pen [id number] Yes Inject 10-50 Units into the skin daily. Titrate as directed. Alfredia Ferguson, PA-C Taking Active            Med Note Lauretta Chester, Coy Saunas A   Tue Jul 03, 2022  2:11 PM) Currently 30 units daily  Insulin Pen Needle (BD PEN NEEDLE NANO U/F)  32G X 4 MM MISC [id number] Yes Use to inject insulin daily as directed Alfredia Ferguson, PA-C Taking Active   losartan (COZAAR) 100 MG tablet [id number] Yes TAKE ONE TABLET BY MOUTH ONCE DAILY Alfredia Ferguson, PA-C Taking Active   montelukast (SINGULAIR) 10 MG tablet [id number] Yes TAKE ONE TABLET BY MOUTH EVERYDAY AT  BEDTIME Alfredia Ferguson, PA-C Taking Active   Multiple Vitamin (MULTIVITAMIN WITH MINERALS) TABS tablet [id number] Yes Take 1 tablet by mouth daily. Centrum Silver Women's 50+ [provider] Taking Active   NON FORMULARY [id number] Yes Take 1 capsule by mouth in the morning and at bedtime. AZO Bladder Control Supplement [provider] Taking Active   OneTouch Delica Lancets 33G MISC [id number] Yes Use to check blood sugars once daily as directed. Erasmo Downer, MD Taking Active   oxybutynin (DITROPAN-XL) 10 MG 24 hr tablet [id number] Yes TAKE ONE TABLET BY MOUTH EVERYDAY AT BEDTIME Alfredia Ferguson, PA-C Taking Active   propranolol ER (INDERAL LA) 60 MG 24 hr capsule [id number] Yes TAKE ONE CAPSULE BY MOUTH ONCE DAILY Alfredia Ferguson, PA-C Taking Active   Rimegepant Sulfate (NURTEC) 75 MG TBDP [id number] Yes Take 1 mg by mouth daily as needed. At onset of Rober Minion, PA-C Taking Active   rosuvastatin (CRESTOR) 5 MG tablet [id number] Yes TAKE ONE TABLET BY MOUTH ONCE DAILY Alfredia Ferguson, PA-C Taking Active             SDOH:  (Social Determinants of Health) assessments and interventions performed: Yes SDOH Interventions    Flowsheet Row Telephone from 05/11/2022 in [facility] Community Care Coordination Clinical Support from 03/12/2022 in [facility] Chronic Care Management from 05/15/2021 in [facility] Practice Chronic Care Management from 03/20/2021 in [facility] Clinical Support from 03/08/2021 in [facility] Chronic Care Management from  02/21/2021 in [facility]  SDOH Interventions        Food Insecurity Interventions Intervention Not Indicated Intervention Not Indicated -- -- Intervention Not Indicated --  Housing Interventions Intervention Not Indicated Intervention Not Indicated -- -- -- --  Transportation Interventions -- Intervention Not Indicated -- -- Intervention Not Indicated --  Utilities Interventions -- Intervention Not Indicated -- -- -- --  Alcohol Usage Interventions -- Intervention Not Indicated (Score <7) -- -- -- --  Financial Strain Interventions -- Intervention Not Indicated Intervention Not Indicated Intervention Not Indicated Intervention Not Indicated Intervention Not Indicated  Physical Activity Interventions -- Intervention Not Indicated -- -- Intervention Not Indicated --  Stress Interventions -- Intervention Not Indicated -- -- Intervention Not Indicated --  Social Connections Interventions -- Intervention Not Indicated -- -- Intervention Not Indicated --       Medication Assistance: None required.  Patient affirms current coverage meets needs.  Medication Access: Within the past 30 days, how often has patient missed a dose of medication? None Is a pillbox or other method used to improve adherence? Yes  Factors that may affect medication adherence? no barriers identified Are meds synced by current pharmacy? Yes  Are meds delivered by current pharmacy? Yes  Does patient experience delays in picking up medications due to transportation concerns? No   [facility] Reviewed: Is patient disadvantaged to use [facility]?: No  Current Rx insurance plan: Dealer Name and location of Current pharmacy:  [facility] - [place], [place] - [address] [place] [address] Phone: [phone] Fax: [phone]  [facility] - [place], [place] - [address] [facility] [address] [place]  [address] [place] [address] Phone: [phone] Fax: [phone]  [id number], [place] - [address] AT [facility] [address] [facility] [place] [address] Phone:  [phone] Fax: [phone]  Reason patient declined to change pharmacies: Patient is already actively enrolled with [facility]  Compliance/Adherence/Medication fill history: Care Gaps: Covid Vaccine  HIV Screening  Shingrix  Pneumonia  Foot Exam  Influenza   Star-Rating Drugs: Rosuvastatin 5 mg: LF 04/10/22 for 30-DS from [facility]   Losartan 100 mg: LF 04/10/22 for 30-DS from [facility]   Glipizide 10 mg: LF 04/10/22 for 30-DS from [facility]  Assessment/Plan  Hypertension (BP goal <130/80) -Uncontrolled:  -Current treatment: Losartan 100 mg daily: Appropriate, Query effective  Propranolol ER 60 mg daily: Appropriate, Query effective   -Medications previously tried:  -Current home readings:  137/88 124/82 134/97 144/90 163/90  -Denies hypotensive/hypertensive symptoms -Continue current medications   Hyperlipidemia: (LDL goal < 70) -Controlled: Not addressed during this visit. -Current treatment: Rosuvastatin 5 mg daily (AM) -Medications previously tried: NA  -Recommended to continue current medication  Diabetes (A1c goal <7%) -Uncontrolled -Current medications: Glipizide XL 10 mg daily: Appropriate, Query effective  Trulicity 3 mg weekly: Appropriate, Query effective Lantus 36 units daily (0.39 u/kg): Appropriate, Query effective  -Medications previously tried: Metformin (sluggish, stomach pain), finerenone (cost)   -Current home glucose readings  AM After breakfast Bedtime  3-Jun 127  232  2-Jun  133 157  1-Jun 152    31-May  208 287  30-May 122  143  Average 134 171 205  -Denies hypoglycemia symptoms. -Current meal patterns: working on cutting down on bread and pasta, as well as sweets. She wants to eat more fruits and  vegetables but states that fresh produce is often too expensive.   -Current exercise: 10-15 minutes exercises daily and plan to starts silver group exercise classes once weekly and chair exercises.  -INCREASE Trulicity to 4.5 mg weekly  -Check A1c next month, will provide sample of Dexcom.   Asthma (Goal: control symptoms and prevent exacerbations) -Improved -Current treatment  Ventolin HFA 1-2 puffs every 4-6 hours as needed Symbicort 80-4.5 2 puffs twice daily  Montelukast 10 mg daily  -Medications previously tried: NA  -Exacerbations requiring treatment in last 6 months: None -Patient reports consistent use of maintenance inhaler.  -Continue current medications   Bipolar Disorder (Goal: Achieve symptom remission) -Not ideally controlled: Not addressed during this visit. -Current treatment: None  -Medications previously tried/failed: Divalproex, Benztropine, Wellbutrin, Citalopram, Sertraline  -Counseled on importance of keeping upcoming psychiatry appointment   Migraines (Goal: Prevent migraines) -Controlled -Current treatment  Propanolol ER 60 mg daily  Rimegepant 75 mg daily as needed: Appropriate, Query effective  -Medications previously tried: Divalproex, Excedrin (GI Bleed) -Continue current medications   Chronic Pain (Goal: Minimize symptoms) -Controlled: Not addressed during this visit. -Managed by Altamese Cabal, PA-C (Ortho) -Current treatment  Baclofen 10 mg twice daily as needed  Boswellia extract 500 mg twice daily  Tramadol 50 mg q6h PRN  -Medications previously tried: NA -Requires rare use of tramadol   -Recommended to continue current medication  Overactive Bladder (Goal: Minimize symptoms) -Controlled -Current treatment  Oxybutynin XL 10 mg daily  -Medications previously tried: NA -Excellent symptomatic control.   -Recommended to continue current medication  Follow Up Plan: Telephone follow up appointment with care management team member scheduled  for: 07/31/2022 at 10:00 AM  Angelena Sole, PharmD, Patsy Baltimore, CPP  Clinical Pharmacist Practitioner  [facility] [phone]

## 2022-08-01 ENCOUNTER — Other Ambulatory Visit: Payer: Self-pay | Admitting: Physician Assistant

## 2022-08-01 DIAGNOSIS — H524 Presbyopia: Secondary | ICD-10-CM | POA: Diagnosis not present

## 2022-08-01 DIAGNOSIS — E1122 Type 2 diabetes mellitus with diabetic chronic kidney disease: Secondary | ICD-10-CM

## 2022-08-01 DIAGNOSIS — H5213 Myopia, bilateral: Secondary | ICD-10-CM | POA: Diagnosis not present

## 2022-08-01 DIAGNOSIS — H2513 Age-related nuclear cataract, bilateral: Secondary | ICD-10-CM | POA: Diagnosis not present

## 2022-08-01 DIAGNOSIS — Z7984 Long term (current) use of oral hypoglycemic drugs: Secondary | ICD-10-CM | POA: Diagnosis not present

## 2022-08-01 DIAGNOSIS — E119 Type 2 diabetes mellitus without complications: Secondary | ICD-10-CM | POA: Diagnosis not present

## 2022-08-01 DIAGNOSIS — J4540 Moderate persistent asthma, uncomplicated: Secondary | ICD-10-CM

## 2022-08-01 DIAGNOSIS — H52223 Regular astigmatism, bilateral: Secondary | ICD-10-CM | POA: Diagnosis not present

## 2022-08-02 ENCOUNTER — Telehealth: Payer: Self-pay

## 2022-08-02 NOTE — Progress Notes (Cosign Needed)
Care Management & Coordination Services Pharmacy Team Pharmacy Assistant   Name: Joanna Bishop  MRN: [id number] DOB: 10-21-1955  Reason for Encounter: Medication Coordination and Delivery for [facility]  Contacted patient to discuss medications and coordinate delivery from [facility]. Spoke with patient on 08/02/2022   Chart review: Recent office visits:  None ID  Recent consult visits:  None ID  Hospital visits:  None in previous 6 months  Medications: Outpatient Encounter Medications as of 08/02/2022  Medication Sig   acetaminophen (TYLENOL) 650 MG CR tablet Take 650 mg by mouth every 8 (eight) hours as needed for pain.   albuterol (VENTOLIN HFA) 108 (90 Base) MCG/ACT inhaler INHALE 1-2 PUFFS BY MOUTH into THE lungs EVERY 4 TO 6 HOURS AS NEEDED FOR WHEEZING AND/OR SHORTNESS OF BREATH   blood glucose meter kit and supplies Dispense based on patient and insurance preference. Use daily as directed. 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   budesonide-formoterol (SYMBICORT) 80-4.5 MCG/ACT inhaler Inhale 2 puffs into the lungs 2 (two) times daily.   Dulaglutide (TRULICITY) 4.5 MG/0.5ML SOPN Inject 4.5 mg into the skin once a week.   estradiol (ESTRACE VAGINAL) 0.1 MG/GM vaginal cream Place 1 Applicatorful vaginally 3 (three) times a week.   glipiZIDE (GLUCOTROL XL) 10 MG 24 hr tablet TAKE ONE TABLET BY MOUTH EVERY MORNING   glucose blood (ONETOUCH VERIO) test strip Use to check blood sugars once daily as instructed   Insulin Pen Needle (BD PEN NEEDLE NANO U/F) 32G X 4 MM MISC Use to inject insulin daily as directed   LANTUS SOLOSTAR 100 UNIT/ML Solostar Pen Inject 36 Units into the skin daily.   losartan (COZAAR) 100 MG tablet TAKE ONE TABLET BY MOUTH ONCE DAILY   montelukast (SINGULAIR) 10 MG tablet TAKE ONE TABLET BY MOUTH EVERYDAY AT BEDTIME   Multiple Vitamin (MULTIVITAMIN WITH MINERALS) TABS tablet Take 1 tablet by mouth daily. Centrum Silver Women's 50+   NON FORMULARY Take 1 capsule by  mouth in the morning and at bedtime. AZO Bladder Control Supplement   OneTouch Delica Lancets 33G MISC Use to check blood sugars once daily as directed.   oxybutynin (DITROPAN-XL) 10 MG 24 hr tablet TAKE ONE TABLET BY MOUTH EVERYDAY AT BEDTIME   propranolol ER (INDERAL LA) 60 MG 24 hr capsule TAKE ONE CAPSULE BY MOUTH ONCE DAILY   Rimegepant Sulfate (NURTEC) 75 MG TBDP Take 1 mg by mouth daily as needed. At onset of migraine/aura   rosuvastatin (CRESTOR) 5 MG tablet TAKE ONE TABLET BY MOUTH ONCE DAILY   No facility-administered encounter medications on file as of 08/02/2022.   BP Readings from Last 3 Encounters:  07/17/22 123/64  05/28/22 120/78  05/15/22 138/75    Pulse Readings from Last 3 Encounters:  07/17/22 (!) 110  05/28/22 97  05/15/22 84    Lab Results  Component Value Date/Time   HGBA1C 8.3 (H) 05/15/2022 02:12 PM   HGBA1C 6.0 (A) 09/06/2021 09:45 AM   HGBA1C 13.8 (A) 01/12/2021 01:33 PM   HGBA1C 8.8 (H) 03/12/2018 09:32 AM   Lab Results  Component Value Date   CREATININE 1.17 (H) 09/06/2021   BUN 23 09/06/2021   GFRNONAA 60 (L) 03/09/2021   GFRAA >60 06/25/2019   NA 137 09/06/2021   K 4.5 09/06/2021   CALCIUM 9.5 09/06/2021   CO2 19 (L) 09/06/2021   Cycle dispensing form sent to Leilani Able, [employer] for review.   Last adherence delivery date: 07/13/2022      Patient is due  for next adherence delivery on: 08/13/2022  This delivery to include: Vials  30 Days  Losartan 50 mg 1 tablet daily (Breakfast) Glipizide 10 mg 1 tablet daily (Breakfast) Montelukast 10 mg 1 tablet daily (Bedtime) Rosuvastatin 5 mg 1 tablet daily (Breakfast) Oxybutynin 10 mg 1 tablet daily (Bedtime) Propanolol ER 60 mg daily   Patient declined the following medications this month: Albuterol 108 Inhaler inhale 1-2 puffs every every 4-6 hours prn for wheezing or shortness of breath  Nurtec 75 mg 1 tablet as needed at onset of migraine-Patient receives this medication through [facility] Trulicity  Inject 1.5 mg into the skin once a week- Patient is getting this medication through [facility] Symbicort 80-4.5 mcg Inhaler Inhale 2 puffs twice daily-Patient will be getting this medication from [facility] OneTouch Strips - Adequate supply OneTouch Lancets- Adequate supply  No refill request needed.  Confirmed delivery date of 08/14/2022 via FedEx/UPS, advised patient that pharmacy will contact them the morning of delivery.  Any concerns about your medications? No  How often do you forget or accidentally miss a dose? Never  Do you use a pillbox? Yes   [name], CPA/CMA Clinical Pharmacist Assistant Phone: [phone]

## 2022-08-28 ENCOUNTER — Telehealth: Payer: Self-pay | Admitting: Physician Assistant

## 2022-08-28 ENCOUNTER — Other Ambulatory Visit: Payer: Self-pay | Admitting: Physician Assistant

## 2022-08-28 ENCOUNTER — Telehealth: Payer: Self-pay

## 2022-08-28 DIAGNOSIS — G43109 Migraine with aura, not intractable, without status migrainosus: Secondary | ICD-10-CM

## 2022-08-28 NOTE — Telephone Encounter (Signed)
Copied from CRM [id number]. Topic: General - Other >> Aug 28, 2022 10:05 AM Pincus Sanes wrote: Pls fu with pt, wanting to contact Trinna Post as he was working with her re BS and told no forwarding info available by office staff, Pt is upset as Drubel is leaving and has to get new dr. Jeannine Boga advised pt she will still be a pt,  another PCP is assigned,FU at [phone] pt seems more stressed as she was working with Trinna Post, thought office may want to fu

## 2022-08-28 NOTE — Telephone Encounter (Signed)
Copied from CRM [id number]. Topic: Appointment Scheduling - Scheduling Inquiry for Clinic >> Aug 22, 2022 10:37 AM Franchot Heidelberg wrote: Reason for CRM: Pt needs to reschedule her appt with Trinna Post, please advise  Best contact:  [phone]) [phone] >> Aug 22, 2022  2:15 PM Victorino Dike D wrote: I am not clear on what to tell this patient and where to refer this to.

## 2022-08-29 ENCOUNTER — Encounter: Payer: Self-pay | Admitting: Physician Assistant

## 2022-08-29 ENCOUNTER — Other Ambulatory Visit: Payer: Self-pay | Admitting: Pharmacist

## 2022-08-29 ENCOUNTER — Encounter: Payer: Self-pay | Admitting: Pharmacist

## 2022-08-29 ENCOUNTER — Other Ambulatory Visit: Payer: Self-pay | Admitting: Physician Assistant

## 2022-08-29 DIAGNOSIS — N1831 Chronic kidney disease, stage 3a: Secondary | ICD-10-CM

## 2022-08-29 MED ORDER — FREESTYLE LIBRE 3 SENSOR MISC
11 refills | Status: DC
Start: 2022-08-29 — End: 2023-06-03

## 2022-08-31 NOTE — Progress Notes (Unsigned)
   08/29/2022  Patient ID: Joanna Bishop, female   DOB: 09/10/1955, 67 y.o.   MRN: [id number]  Receive message from office requesting outreach to patient. Note patient previously working with Hipolito Bayley.  Outreach to patient by telephone.   Patient requests assistance with getting started on continuous glucose monitoring.  Provide counseling on continuous glucose monitoring (CGM) for glucose monitoring - Note patient has a smartphone, which she plans to use in place of the reader device   Patient advises that he has both [facility] prescription coverage as well as coverage through [facility].  - Collaborate with [facility] and find that [employer] 3 sensors covered for patient through pharmacy. Representative advises that pharmacy is only able to fill for 1 month supply of sensors at a time.  Collaborate with PCP who confirms sample of [employer] Libre 3 sensor available for patient and sends Rx for sensors to [employer] - Provide patient with counseling, link to guide/setup videos via MyChart and contact number for [employer] Program - Patient to let CM Pharmacist know if has questions/would like in person training   Plan:  1) Patient to review Freestyle Libre 3 sensor and app videos and reach out to pharmacist, office or [employer] Program with any questions  2) Patient to pick up sample of Freestyle Libre 3 sensor from office and follow up with [facility] regarding shipment of sensor Rx. Patient to follow up with [facility] each month for refill of sensor Rx, at least 14-20 business days before needing refill.  3) Will plan to send patient link for patient to shared data with clinic via Lib[employer], once he has the App setup   Follow Up Plan: Clinical Pharmacist will follow up with patient by telephone on 09/19/2022 at 8:30 AM   Estelle Grumbles, PharmD, [facility] [phone]

## 2022-09-03 NOTE — Patient Instructions (Signed)
Please copy and paste the following web address is for the [employer] 3 Getting Started Setup Videos:   [email or web address]/us-[email or web address]-to-set-[email or web address]     Also, you can reach out to the [employer] Support at [phone]. Their customer support is available 7 days a week 8 am to 8 pm.     Please let me or the office know if you have any medication related questions or concerns.   Thank you!   Estelle Grumbles, PharmD, [facility] [phone]

## 2022-09-19 ENCOUNTER — Encounter: Payer: Self-pay | Admitting: Pharmacist

## 2022-09-19 ENCOUNTER — Other Ambulatory Visit: Payer: Medicare HMO | Admitting: Pharmacist

## 2022-09-19 NOTE — Progress Notes (Signed)
09/19/2022 Name: Joanna Bishop MRN: [id number] DOB: 1955/04/14  Chief Complaint  Patient presents with   Medication Management    Joanna Bishop is a 67 y.o. year old female who presented for a telephone visit.   They were referred to the pharmacist by their PCP for assistance in managing diabetes.    Subjective:  Care Team: Primary Care Provider: Alfredia Ferguson, PA-C   Medication Access/Adherence  Current Pharmacy:  [facility] - [place], [place] - [address] [place] [address] Phone: [phone] Fax: [phone]  [facility] - [place], [place] - [address] [facility] [address] [place] [address] Phone: [phone] Fax: [phone]   Patient reports affordability concerns with their medications: No  Patient reports access/transportation concerns to their pharmacy: No  Patient reports adherence concerns with their medications:  No    Weekly pillbox for AM and PM medicine. Reports has started using Dorothyann Gibbs App for daily reminders on her phone   Diabetes:  Current medications:  - Trulicity 4.5 mg weekly on Mondays - glipizide ER 10 mg daily every morning - Lantus 36 units daily  Medications tried in the past: metformin (GI side effects)  Patient has now started using Freestyle Libre 3 continuous blood glucose monitor (CGM) Using her smart phone in place of reader devices Aid patient with connecting data/review results from Freestyle LibreView:     Patient denies hypoglycemic s/sx including dizziness, shakiness, sweating.   Current meal patterns:  - Breakfast: 2-3 eggs with 1-2 pieces of toast or Belvita breakfast wafers - Lunch: Chicken salad with crackers or banana or banana sandwich - Supper: Grilled chicken or steak with beans or broccoli or spinach and sometimes potato or egg noodles - Snacks: cashews or grapes or apple slices or string cheese - Drinks: water  (sometimes with sugar-free flavor) and 1 diet soda/day  Current physical activity: walking limited by knee pain  Statin: rosuvastatin 5 mg daily   Asthma:  Current medications: - Symbicort 80-4.5 mcg inhaler - 2 puffs twice daily - albuterol inhaler 1-2 puffs every 4-6 hours as needed for wheezing and/or shortness of breath - montelukast 10 mg QHS   Avoids triggers (going outside heat, overexertion, allergens in Spring and Fall)  Reports rarely needs to use her albuterol inhaler  Confirms rinses and spits out mouth after each use of Symbicort   Hypertension:  Current medications:  - losartan 100 mg daily - propranolol ER 60 mg daily (takes for migraine prevention)   Patient has a home upper arm home BP cuff Denies checking recently  Patient denies hypotensive s/sx including dizziness, lightheadedness as long as takes positional changes slowly.   Current physical activity: walking limited by knee pain    Objective:  Lab Results  Component Value Date   HGBA1C 8.3 (H) 05/15/2022    Lab Results  Component Value Date   CREATININE 1.17 (H) 09/06/2021   BUN 23 09/06/2021   NA 137 09/06/2021   K 4.5 09/06/2021   CL 103 09/06/2021   CO2 19 (L) 09/06/2021    Lab Results  Component Value Date   CHOL 129 09/06/2021   HDL 36 (L) 09/06/2021   LDLCALC 49 09/06/2021   TRIG 288 (H) 09/06/2021   CHOLHDL 3.6 09/06/2021   BP Readings from Last 3 Encounters:  07/17/22 123/64  05/28/22 120/78  05/15/22 138/75   Pulse Readings from Last 3 Encounters:  07/17/22 (!) 110  05/28/22  97  05/15/22 84     Medications Reviewed Today     Reviewed by Manuela Neptune, RPH-CPP (Pharmacist) on 09/19/22 at [phone]  Med List Status: <None>   Medication Order Taking? Sig Documenting Provider Last Dose Status Informant  acetaminophen (TYLENOL) 650 MG CR tablet [id number] Yes Take 650 mg by mouth every 8 (eight) hours as needed for pain. [provider] Taking Active    albuterol (VENTOLIN HFA) 108 (90 Base) MCG/ACT inhaler [id number] Yes INHALE 1-2 PUFFS BY MOUTH into THE lungs EVERY 4 TO 6 HOURS AS NEEDED FOR WHEEZING AND/OR SHORTNESS OF BREATH Alfredia Ferguson, PA-C Taking Active   blood glucose meter kit and supplies [id number]  Dispense based on patient and insurance preference. Use daily as directed. 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 Erasmo Downer, MD  Active   budesonide-formoterol [facility]) 80-4.5 MCG/ACT inhaler [id number] Yes Inhale 2 puffs into the lungs 2 (two) times daily. Alfredia Ferguson, PA-C Taking Active   Continuous Glucose Sensor (FREESTYLE LIBRE 3 SENSOR) [place] [id number]  Place 1 sensor on the skin every 14 days. Use to check glucose continuously Drubel, Lillia Abed, PA-C  Active   Dulaglutide (TRULICITY) 4.5 MG/0.5ML SOPN [id number] Yes Inject 4.5 mg into the skin once a week. Alfredia Ferguson, PA-C Taking Active   estradiol (ESTRACE VAGINAL) 0.1 MG/GM vaginal cream [id number] Yes Place 1 Applicatorful vaginally 3 (three) times a week. Erasmo Downer, MD Taking Active            Med Note Ronney Asters, [facility] A   Wed Sep 19, 2022  8:59 AM) Uses as needed  glipiZIDE (GLUCOTROL XL) 10 MG 24 hr tablet [id number] Yes TAKE ONE TABLET BY MOUTH EVERY MORNING Alfredia Ferguson, PA-C Taking Active   glucose blood (ONETOUCH VERIO) test strip [id number]  Use to check blood sugars once daily as instructed Erasmo Downer, MD  Active   Insulin Pen Needle (BD PEN NEEDLE NANO U/F) 32G X 4 MM MISC [id number]  Use to inject insulin daily as directed Alfredia Ferguson, PA-C  Active   LANTUS SOLOSTAR 100 UNIT/ML Solostar Pen [id number] Yes Inject 36 Units into the skin daily. Alfredia Ferguson, PA-C Taking Active   losartan (COZAAR) 100 MG tablet [id number] Yes TAKE ONE TABLET BY MOUTH ONCE DAILY Alfredia Ferguson, PA-C Taking Active   montelukast (SINGULAIR) 10 MG tablet [id number] Yes TAKE ONE TABLET BY MOUTH EVERYDAY AT BEDTIME Alfredia Ferguson, PA-C Taking Active   Multiple Vitamin  (MULTIVITAMIN WITH MINERALS) TABS tablet [id number] Yes Take 1 tablet by mouth daily. Centrum Silver Women's 50+ [provider] Taking Active   NON FORMULARY [id number] Yes Take 1 capsule by mouth in the morning and at bedtime. AZO Bladder Control Supplement [provider] Taking Active   OneTouch Delica Lancets 33G MISC [id number]  Use to check blood sugars once daily as directed. Erasmo Downer, MD  Active   oxybutynin (DITROPAN-XL) 10 MG 24 hr tablet [id number] Yes TAKE ONE TABLET BY MOUTH EVERYDAY AT BEDTIME Alfredia Ferguson, PA-C Taking Active   propranolol ER (INDERAL LA) 60 MG 24 hr capsule [id number] Yes TAKE ONE CAPSULE BY MOUTH ONCE DAILY Alfredia Ferguson, PA-C Taking Active   Rimegepant Sulfate (NURTEC) 75 MG TBDP [id number] No Take 1 mg by mouth daily as needed. At onset of migraine/aura  Patient not taking: Reported on 09/19/2022   Alfredia Ferguson, PA-C Not Taking Active   rosuvastatin (CRESTOR) 5 MG tablet [id number] Yes TAKE ONE TABLET BY MOUTH ONCE DAILY Alfredia Ferguson, PA-C Taking Active  Assessment/Plan:   Encourage patient to contact office to schedule follow up PCP appointment. Note patient's previous PCP no longer at practice, but patient confirms will contact practice to schedule/find out who her new provider will be  Discuss strategies to aid with medication adherence  Diabetes: - Currently uncontrolled - Reviewed long term cardiovascular and renal outcomes of uncontrolled blood sugar - Reviewed goal A1c, goal fasting, and goal 2 hour post prandial glucose - Reviewed dietary modifications including Encourage patient to have well-balanced meals and snacks spread throughout the day, while controlling carbohydrate portion sizes Encourage patient to review nutrition labels for total carbohydrate content of foods - Send patient educational handouts on meal planning, hyperglycemia and hypoglycemia via MyChart as requested - Counsel  patient on s/s of low blood sugar and how to treat lows Review rules of 15s - importance of using 15 grams of sugar to treat low, recheck blood sugar in 15 minutes, treat again if remains low or, if back to normal, having meal if mealtime or snack  Review examples of sources of 15 grams of sugar Encourage patient to pick up glucose tablets to carry with her - Recommend to continue to use Freestyle Libre 3 CGM to monitor blood sugar/as feedback on dietary choices - Recommend to check glucose with fingerstick check when needed for symptoms and as back up to CGM. Patient to contact office if needed for readings outside of established parameters or symptoms   Hypertension: - Counsel on impact of salt/sodium on blood pressure control. Encourage patient to review nutrition labels for sodium content of foods - Encourage patient to continue to take positional changes slowly - Recommend to monitor home blood pressure, keep log of results and have this record to review at upcoming medical appointments. Patient to contact provider office sooner if needed for readings outside of established parameters or symptoms   Asthma: - Currently controlled.  - Reviewed appropriate inhaler technique. - Encourage patient to continue to use maintenance inhaler consistently   Follow Up Plan: Clinical Pharmacist will follow up with patient by telephone on 10/17/2022 at 8:30 AM   Estelle Grumbles, PharmD, [facility] [phone]

## 2022-09-19 NOTE — Patient Instructions (Signed)
Goals Addressed             This Visit's Progress    Pharmacy Goals       Our goal A1c is less than 7%. This corresponds with fasting sugars less than 130 and 2 hour after meal sugars less than 180. Please keep a log of your results when checking your blood sugar   Our goal bad cholesterol, or LDL, is less than 70 . This is why it is important to continue taking your rosuvastatin.  Check your blood pressure twice weekly, and any time you have concerning symptoms like headache, chest pain, dizziness, shortness of breath, or vision changes.   Our goal is less than 130/80.  To appropriately check your blood pressure, make sure you do the following:  1) Avoid caffeine, exercise, or tobacco products for 30 minutes before checking. Empty your bladder. 2) Sit with your back supported in a flat-backed chair. Rest your arm on something flat (arm of the chair, table, etc). 3) Sit still with your feet flat on the floor, resting, for at least 5 minutes.  4) Check your blood pressure. Take 1-2 readings.  5) Write down these readings and bring with you to any provider appointments.  Bring your home blood pressure machine with you to a provider's office for accuracy comparison at least once a year.   Make sure you take your blood pressure medications before you come to any office visit, even if you were asked to fast for labs.   Elisabeth Delles, PharmD, BCACP [facility] [phone]          

## 2022-09-27 ENCOUNTER — Other Ambulatory Visit: Payer: Self-pay | Admitting: Physician Assistant

## 2022-10-10 ENCOUNTER — Other Ambulatory Visit: Payer: Self-pay | Admitting: Physician Assistant

## 2022-10-13 DIAGNOSIS — H52223 Regular astigmatism, bilateral: Secondary | ICD-10-CM | POA: Diagnosis not present

## 2022-10-13 DIAGNOSIS — H524 Presbyopia: Secondary | ICD-10-CM | POA: Diagnosis not present

## 2022-10-17 ENCOUNTER — Telehealth: Payer: Self-pay | Admitting: Pharmacist

## 2022-10-17 ENCOUNTER — Other Ambulatory Visit: Payer: Self-pay

## 2022-10-17 ENCOUNTER — Other Ambulatory Visit: Payer: Medicare HMO | Admitting: Pharmacist

## 2022-10-17 DIAGNOSIS — J4540 Moderate persistent asthma, uncomplicated: Secondary | ICD-10-CM

## 2022-10-17 MED ORDER — BUDESONIDE-FORMOTEROL FUMARATE 80-4.5 MCG/ACT IN AERO: 2.0000 | INHALATION_SPRAY | Freq: Two times a day (BID) | RESPIRATORY_TRACT | 3 refills | Status: DC

## 2022-10-17 NOTE — Progress Notes (Signed)
Patient requesting renewal of Symbicort inhaler prescription as current prescription is expired.  Would you please send new Rx for Symbicort to [facility] for patient?  Thank you!  Estelle Grumbles, PharmD, [facility] [phone]

## 2022-10-17 NOTE — Patient Instructions (Signed)
Goals Addressed             This Visit's Progress    Pharmacy Goals       Our goal A1c is less than 7%. This corresponds with fasting sugars less than 130 and 2 hour after meal sugars less than 180. Please keep a log of your results when checking your blood sugar   Our goal bad cholesterol, or LDL, is less than 70 . This is why it is important to continue taking your rosuvastatin.  Check your blood pressure twice weekly, and any time you have concerning symptoms like headache, chest pain, dizziness, shortness of breath, or vision changes.   Our goal is less than 130/80.  To appropriately check your blood pressure, make sure you do the following:  1) Avoid caffeine, exercise, or tobacco products for 30 minutes before checking. Empty your bladder. 2) Sit with your back supported in a flat-backed chair. Rest your arm on something flat (arm of the chair, table, etc). 3) Sit still with your feet flat on the floor, resting, for at least 5 minutes.  4) Check your blood pressure. Take 1-2 readings.  5) Write down these readings and bring with you to any provider appointments.  Bring your home blood pressure machine with you to a provider's office for accuracy comparison at least once a year.   Make sure you take your blood pressure medications before you come to any office visit, even if you were asked to fast for labs.   Elisabeth Delles, PharmD, BCACP [facility] [phone]          

## 2022-10-17 NOTE — Progress Notes (Signed)
10/17/2022 Name: Joanna Bishop MRN: [id number] DOB: 1955-06-09  Chief Complaint  Patient presents with   Medication Management    Joanna Bishop is a 67 y.o. year old female who presented for a telephone visit.   They were referred to the pharmacist by their PCP for assistance in managing diabetes.      Subjective:   Care Team: Primary Care Provider: Alfredia Ferguson, PA-C   Medication Access/Adherence  Current Pharmacy:  [facility] - [place], [place] - [address] [place] [address] Phone: [phone] Fax: [phone]  [facility] - [place], [place] - [address] [facility] [address] [place] [place] [address] Phone: [phone] Fax: [phone]   Patient reports affordability concerns with their medications: No  Patient reports access/transportation concerns to their pharmacy: No  Patient reports adherence concerns with their medications:  No    Note patient currently uses both [facility] mail order pharmacy and [facility] -[facility] is closing as of 11/02/2022. Patient shares that she plans to use [facility] on [address] in [place] as her local pharmacy moving forward  Uses weekly pillbox for AM and PM medicine and Dorothyann Gibbs App for daily reminders on her phone   Diabetes:   Current medications:  - Trulicity 4.5 mg weekly on Mondays - glipizide ER 10 mg daily every morning - Lantus 36 units daily   Medications tried in the past: metformin (GI side effects)   Patient has now started using Freestyle Libre 3 continuous blood glucose monitor (CGM) Using her smart phone in place of reader devices Aid patient with connecting data/review results from Freestyle LibreView:       Patient denies hypoglycemic s/sx including dizziness, shakiness, sweating.    Reports has been using feedback from CGM to make positive dietary changes/reinforcing reducing portion  sizes of sweets   Current physical activity: walking limited by knee pain   Statin: rosuvastatin 5 mg daily     Asthma:   Current medications: - Symbicort 80-4.5 mcg inhaler - 2 puffs twice daily - albuterol inhaler 1-2 puffs every 4-6 hours as needed for wheezing and/or shortness of breath - montelukast 10 mg QHS   Avoids triggers (going outside heat, overexertion, allergens in Spring and Fall)   Reports recently having a dry cough. Denies other symptoms. Plans to contact office to schedule an appointment regarding her cough  Patient requests renewal of her Symbicort inhaler as current prescription is out of refills     Hypertension:   Current medications:  - losartan 100 mg daily - propranolol ER 60 mg daily (takes for migraine prevention)     Patient has a home upper arm home BP cuff Recalls last checked last week, reading: 135/88, HR 85    Patient denies hypotensive s/sx including dizziness, lightheadedness as long as takes positional changes slowly.    Current physical activity: walking limited by knee pain     Objective:  Lab Results  Component Value Date   HGBA1C 8.3 (H) 05/15/2022    Lab Results  Component Value Date   CREATININE 1.17 (H) 09/06/2021   BUN 23 09/06/2021   NA 137 09/06/2021   K 4.5 09/06/2021   CL 103 09/06/2021   CO2 19 (L) 09/06/2021    Lab Results  Component Value Date   CHOL 129 09/06/2021   HDL 36 (L) 09/06/2021   LDLCALC 49 09/06/2021   TRIG 288 (H) 09/06/2021   CHOLHDL 3.6  09/06/2021   BP Readings from Last 3 Encounters:  07/17/22 123/64  05/28/22 120/78  05/15/22 138/75   Pulse Readings from Last 3 Encounters:  07/17/22 (!) 110  05/28/22 97  05/15/22 84     Medications Reviewed Today     Reviewed by Manuela Neptune, RPH-CPP (Pharmacist) on 10/17/22 at 0913  Med List Status: <None>   Medication Order Taking? Sig Documenting Provider Last Dose Status Informant  acetaminophen (TYLENOL) 650 MG CR tablet  [id number]  Take 650 mg by mouth every 8 (eight) hours as needed for pain. [provider]  Active   albuterol (VENTOLIN HFA) 108 (90 Base) MCG/ACT inhaler [id number]  INHALE 1-2 PUFFS BY MOUTH into THE lungs EVERY 4 TO 6 HOURS AS NEEDED FOR WHEEZING AND/OR SHORTNESS OF BREATH Alfredia Ferguson, PA-C  Active   blood glucose meter kit and supplies [id number]  Dispense based on patient and insurance preference. Use daily as directed. 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 Erasmo Downer, MD  Active   budesonide-formoterol [facility]) 80-4.5 MCG/ACT inhaler [id number] Yes Inhale 2 puffs into the lungs 2 (two) times daily. Alfredia Ferguson, PA-C Taking Active   Continuous Glucose Sensor (FREESTYLE LIBRE 3 SENSOR) [place] [id number]  Place 1 sensor on the skin every 14 days. Use to check glucose continuously Drubel, Lillia Abed, PA-C  Active   Dulaglutide (TRULICITY) 4.5 MG/0.5ML SOPN [id number] Yes Inject 4.5 mg into the skin once a week. Alfredia Ferguson, PA-C Taking Active   estradiol (ESTRACE VAGINAL) 0.1 MG/GM vaginal cream [id number]  Place 1 Applicatorful vaginally 3 (three) times a week. Erasmo Downer, MD  Active            Med Note Ronney Asters, [facility] A   Wed Sep 19, 2022  8:59 AM) Uses as needed  glipiZIDE (GLUCOTROL XL) 10 MG 24 hr tablet [id number] Yes TAKE ONE TABLET BY MOUTH EVERY MORNING Bacigalupo, Marzella Schlein, MD Taking Active   glucose blood (ONETOUCH VERIO) test strip [id number]  Use to check blood sugars once daily as instructed Erasmo Downer, MD  Active   Insulin Pen Needle (BD PEN NEEDLE NANO U/F) 32G X 4 MM MISC [id number]  Use to inject insulin daily as directed Alfredia Ferguson, PA-C  Active   LANTUS SOLOSTAR 100 UNIT/ML Solostar Pen [id number] Yes Inject 36 Units into the skin daily. Alfredia Ferguson, PA-C Taking Active   losartan (COZAAR) 100 MG tablet [id number] Yes TAKE ONE TABLET BY MOUTH ONCE DAILY Alfredia Ferguson, PA-C Taking Active   montelukast (SINGULAIR) 10 MG tablet [id number] Yes TAKE ONE  TABLET BY MOUTH EVERYDAY AT BEDTIME Alfredia Ferguson, PA-C Taking Active   Multiple Vitamin (MULTIVITAMIN WITH MINERALS) TABS tablet [id number]  Take 1 tablet by mouth daily. Centrum Silver Women's 50+ [provider]  Active   NON FORMULARY [id number]  Take 1 capsule by mouth in the morning and at bedtime. AZO Bladder Control Supplement [provider]  Active   OneTouch Delica Lancets 33G MISC [id number]  Use to check blood sugars once daily as directed. Erasmo Downer, MD  Active   oxybutynin (DITROPAN-XL) 10 MG 24 hr tablet [id number]  TAKE ONE TABLET BY MOUTH EVERYDAY AT BEDTIME Alfredia Ferguson, PA-C  Active   propranolol ER (INDERAL LA) 60 MG 24 hr capsule [id number] Yes TAKE ONE CAPSULE BY MOUTH ONCE DAILY Alfredia Ferguson, PA-C Taking Active   Rimegepant Sulfate (NURTEC) 75 MG TBDP [id number]  Take 1 mg by mouth daily as needed. At onset of migraine/aura  Patient not taking: Reported on  09/19/2022   Alfredia Ferguson, PA-C  Active   rosuvastatin (CRESTOR) 5 MG tablet [id number]  TAKE ONE TABLET BY MOUTH ONCE DAILY Alfredia Ferguson, PA-C  Active               Assessment/Plan:   Update patient's preferred pharmacy list in chart as patient reports will now be using [facility] in place of [facility] (closing). Note patient will continue to use [facility] mail order  Will collaborate with office clinical team as patient requests renewal of her Symbicort inhaler prescription, which is currently expired, to [facility]  Encourage patient to contact office to schedule follow up PCP appointment. Note patient's previous PCP no longer at practice, but patient confirms will contact practice to schedule/find out who her new provider will be    Diabetes: - Reviewed long term cardiovascular and renal outcomes of uncontrolled blood sugar - Reviewed goal A1c, goal fasting, and goal 2 hour post prandial glucose - Reviewed dietary modifications including Encourage patient to  have well-balanced meals and snacks spread throughout the day, while controlling carbohydrate portion sizes Advise patient against skipping meals Encourage patient to review nutrition labels for total carbohydrate content of foods - Have counseled patient on s/s of low blood sugar and how to treat lows Encourage patient to pick up glucose tablets to carry with her - Recommend to continue to use Freestyle Libre 3 CGM to monitor blood sugar/as feedback on dietary choices - Recommend to check glucose with fingerstick check when needed for symptoms and as back up to CGM. Patient to contact office if needed for readings outside of established parameters or symptoms     Hypertension: - Have encouraged patient to continue to take positional changes slowly - Recommend to monitor home blood pressure, keep log of results and have this record to review at upcoming medical appointments. Patient to contact provider office sooner if needed for readings outside of established parameters or symptoms     Asthma: - Patient to schedule appointment with PCP office related to her cough     Follow Up Plan: Clinical Pharmacist will follow up with patient by telephone on 01/16/2023 at 8:30 AM    Estelle Grumbles, PharmD, [facility] [phone]

## 2022-10-18 ENCOUNTER — Ambulatory Visit (INDEPENDENT_AMBULATORY_CARE_PROVIDER_SITE_OTHER): Payer: Medicare HMO | Admitting: Family Medicine

## 2022-10-18 ENCOUNTER — Encounter: Payer: Self-pay | Admitting: Family Medicine

## 2022-10-18 VITALS — BP 131/63 | HR 96

## 2022-10-18 DIAGNOSIS — E1159 Type 2 diabetes mellitus with other circulatory complications: Secondary | ICD-10-CM

## 2022-10-18 DIAGNOSIS — Z1211 Encounter for screening for malignant neoplasm of colon: Secondary | ICD-10-CM | POA: Diagnosis not present

## 2022-10-18 DIAGNOSIS — I152 Hypertension secondary to endocrine disorders: Secondary | ICD-10-CM | POA: Diagnosis not present

## 2022-10-18 DIAGNOSIS — N1831 Chronic kidney disease, stage 3a: Secondary | ICD-10-CM

## 2022-10-18 DIAGNOSIS — J4541 Moderate persistent asthma with (acute) exacerbation: Secondary | ICD-10-CM

## 2022-10-18 DIAGNOSIS — E1122 Type 2 diabetes mellitus with diabetic chronic kidney disease: Secondary | ICD-10-CM

## 2022-10-18 DIAGNOSIS — R0989 Other specified symptoms and signs involving the circulatory and respiratory systems: Secondary | ICD-10-CM | POA: Diagnosis not present

## 2022-10-18 LAB — POCT GLYCOSYLATED HEMOGLOBIN (HGB A1C): Hemoglobin A1C: 6.8 % — AB (ref 4.0–5.6)

## 2022-10-18 LAB — POC COVID19 BINAXNOW: SARS Coronavirus 2 Ag: NEGATIVE

## 2022-10-18 MED ORDER — AZITHROMYCIN 500 MG PO TABS
500.0000 mg | ORAL_TABLET | Freq: Every day | ORAL | 0 refills | Status: DC
Start: 2022-10-18 — End: 2023-01-16

## 2022-10-18 MED ORDER — PREDNISONE 50 MG PO TABS
50.0000 mg | ORAL_TABLET | Freq: Every day | ORAL | 0 refills | Status: DC
Start: 2022-10-18 — End: 2023-01-16

## 2022-10-18 MED ORDER — DOXYCYCLINE HYCLATE 100 MG PO TABS
100.0000 mg | ORAL_TABLET | Freq: Two times a day (BID) | ORAL | 0 refills | Status: DC
Start: 2022-10-18 — End: 2023-01-16

## 2022-10-18 NOTE — Assessment & Plan Note (Signed)
Acute on chronic, POC covid negative Recommend abx and steroids with continued use of supportive meds, singulair and inhalers  F/u as needed

## 2022-10-18 NOTE — Assessment & Plan Note (Signed)
Chronic, improved At goal Continue to recommend balanced, lower carb meals. Smaller meal size, adding snacks. Choosing water as drink of choice and increasing purposeful exercise. Urine micro completed

## 2022-10-18 NOTE — Progress Notes (Signed)
Established patient visit   Patient: Joanna Bishop   DOB: 1955/10/11   67 y.o. Female  MRN: [id number] Visit Date: 10/18/2022  Today's healthcare provider: Jacky Kindle, FNP  Introduced to nurse practitioner role and practice setting.  All questions answered.  Discussed provider/patient relationship and expectations.  Subjective    HPI HPI     Cough    Additional comments: Present for 3 days      Last edited by Acey Lav, CMA on 10/18/2022 10:39 AM.      C/o worsening respiratory symptoms  Medications: Outpatient Medications Prior to Visit  Medication Sig   acetaminophen (TYLENOL) 650 MG CR tablet Take 650 mg by mouth every 8 (eight) hours as needed for pain.   albuterol (VENTOLIN HFA) 108 (90 Base) MCG/ACT inhaler INHALE 1-2 PUFFS BY MOUTH into THE lungs EVERY 4 TO 6 HOURS AS NEEDED FOR WHEEZING AND/OR SHORTNESS OF BREATH   blood glucose meter kit and supplies Dispense based on patient and insurance preference. Use daily as directed. 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   budesonide-formoterol (SYMBICORT) 80-4.5 MCG/ACT inhaler Inhale 2 puffs into the lungs 2 (two) times daily.   Continuous Glucose Sensor (FREESTYLE LIBRE 3 SENSOR) MISC Place 1 sensor on the skin every 14 days. Use to check glucose continuously   Dulaglutide (TRULICITY) 4.5 MG/0.5ML SOPN Inject 4.5 mg into the skin once a week.   estradiol (ESTRACE VAGINAL) 0.1 MG/GM vaginal cream Place 1 Applicatorful vaginally 3 (three) times a week.   glipiZIDE (GLUCOTROL XL) 10 MG 24 hr tablet TAKE ONE TABLET BY MOUTH EVERY MORNING   glucose blood (ONETOUCH VERIO) test strip Use to check blood sugars once daily as instructed   Insulin Pen Needle (BD PEN NEEDLE NANO U/F) 32G X 4 MM MISC Use to inject insulin daily as directed   LANTUS SOLOSTAR 100 UNIT/ML Solostar Pen Inject 36 Units into the skin daily.   losartan (COZAAR) 100 MG tablet TAKE ONE TABLET BY MOUTH ONCE DAILY   montelukast (SINGULAIR) 10 MG tablet TAKE ONE TABLET BY  MOUTH EVERYDAY AT BEDTIME   Multiple Vitamin (MULTIVITAMIN WITH MINERALS) TABS tablet Take 1 tablet by mouth daily. Centrum Silver Women's 50+   NON FORMULARY Take 1 capsule by mouth in the morning and at bedtime. AZO Bladder Control Supplement   OneTouch Delica Lancets 33G MISC Use to check blood sugars once daily as directed.   oxybutynin (DITROPAN-XL) 10 MG 24 hr tablet TAKE ONE TABLET BY MOUTH EVERYDAY AT BEDTIME   propranolol ER (INDERAL LA) 60 MG 24 hr capsule TAKE ONE CAPSULE BY MOUTH ONCE DAILY   rosuvastatin (CRESTOR) 5 MG tablet TAKE ONE TABLET BY MOUTH ONCE DAILY   Rimegepant Sulfate (NURTEC) 75 MG TBDP Take 1 mg by mouth daily as needed. At onset of migraine/aura (Patient not taking: Reported on 10/18/2022)   No facility-administered medications prior to visit.   Last CBC Lab Results  Component Value Date   WBC 11.0 (H) 09/06/2021   HGB 13.1 09/06/2021   HCT 39.6 09/06/2021   MCV 87 09/06/2021   MCH 28.8 09/06/2021   RDW 15.5 (H) 09/06/2021   PLT 243 09/06/2021   Last metabolic panel Lab Results  Component Value Date   GLUCOSE 159 (H) 09/06/2021   NA 137 09/06/2021   K 4.5 09/06/2021   CL 103 09/06/2021   CO2 19 (L) 09/06/2021   BUN 23 09/06/2021   CREATININE 1.17 (H) 09/06/2021   EGFR 52 (L) 09/06/2021   CALCIUM  9.5 09/06/2021   PROT 6.7 09/06/2021   ALBUMIN 4.3 09/06/2021   LABGLOB 2.4 09/06/2021   AGRATIO 1.8 09/06/2021   BILITOT 0.7 09/06/2021   ALKPHOS 84 09/06/2021   AST 21 09/06/2021   ALT 25 09/06/2021   ANIONGAP 8 03/09/2021   Last lipids Lab Results  Component Value Date   CHOL 129 09/06/2021   HDL 36 (L) 09/06/2021   LDLCALC 49 09/06/2021   TRIG 288 (H) 09/06/2021   CHOLHDL 3.6 09/06/2021   Last hemoglobin A1c Lab Results  Component Value Date   HGBA1C 6.8 (A) 10/18/2022   Last thyroid functions Lab Results  Component Value Date   TSH 2.510 10/14/2015     Objective    BP 131/63 (BP Location: Right Arm, Patient Position: Sitting,  Cuff Size: Normal)   Pulse 96   SpO2 98%  BP Readings from Last 3 Encounters:  10/18/22 131/63  07/17/22 123/64  05/28/22 120/78   Wt Readings from Last 3 Encounters:  07/17/22 201 lb (91.2 kg)  05/28/22 234 lb (106.1 kg)  05/15/22 232 lb (105.2 kg)   Physical Exam Vitals and nursing note reviewed.  Constitutional:      General: She is not in acute distress.    Appearance: Normal appearance. She is overweight. She is not ill-appearing, toxic-appearing or diaphoretic.  HENT:     Head: Normocephalic and atraumatic.  Cardiovascular:     Rate and Rhythm: Normal rate and regular rhythm.     Pulses: Normal pulses.     Heart sounds: Normal heart sounds. No murmur heard.    No friction rub. No gallop.  Pulmonary:     Effort: Pulmonary effort is normal. No respiratory distress.     Breath sounds: Normal breath sounds. No stridor. No wheezing, rhonchi or rales.  Chest:     Chest wall: No tenderness.  Abdominal:     General: Bowel sounds are normal.     Palpations: Abdomen is soft.  Musculoskeletal:        General: No swelling, tenderness, deformity or signs of injury. Normal range of motion.     Right lower leg: No edema.     Left lower leg: No edema.  Skin:    General: Skin is warm and dry.     Capillary Refill: Capillary refill takes less than 2 seconds.     Coloration: Skin is not jaundiced or pale.     Findings: No bruising, erythema, lesion or rash.  Neurological:     General: No focal deficit present.     Mental Status: She is alert and oriented to person, place, and time. Mental status is at baseline.     Cranial Nerves: No cranial nerve deficit.     Sensory: No sensory deficit.     Motor: No weakness.     Coordination: Coordination normal.  Psychiatric:        Mood and Affect: Mood normal.        Behavior: Behavior normal.        Thought Content: Thought content normal.        Judgment: Judgment normal.       Results for orders placed or performed in visit on  10/18/22  POCT HgB A1C  Result Value Ref Range   Hemoglobin A1C 6.8 (A) 4.0 - 5.6 %   HbA1c POC (<> result, manual entry)     HbA1c, POC (prediabetic range)     HbA1c, POC (controlled diabetic range)    POC COVID-19  Result Value Ref Range   SARS Coronavirus 2 Ag Negative Negative    Assessment & Plan     Problem List Items Addressed This Visit       Cardiovascular and Mediastinum   Hypertension associated with diabetes (HCC)    Chronic, remains elevated Goal of 119/79 Continues on 100 mg losartan and propranolol er 60 mg        Respiratory   Moderate persistent asthma with exacerbation    Acute on chronic, POC covid negative Recommend abx and steroids with continued use of supportive meds, singulair and inhalers  F/u as needed      Relevant Medications   predniSONE (DELTASONE) 50 MG tablet   azithromycin (ZITHROMAX) 500 MG tablet   doxycycline (VIBRA-TABS) 100 MG tablet     Endocrine   Type 2 diabetes mellitus with stage 3a chronic kidney disease, without long-term current use of insulin (HCC)    Chronic, improved At goal Continue to recommend balanced, lower carb meals. Smaller meal size, adding snacks. Choosing water as drink of choice and increasing purposeful exercise. Urine micro completed      Relevant Orders   POCT HgB A1C (Completed)   Urine Microalbumin w/creat. ratio     Other   Respiratory symptoms - Primary    POC COVID negative; continue to monitor symptoms       Relevant Orders   POC COVID-19 (Completed)   Other Visit Diagnoses     Colon cancer screening       Relevant Orders   Cologuard      Return if symptoms worsen or fail to improve.     Leilani Merl, FNP, have reviewed all documentation for this visit. The documentation on 10/18/22 for the exam, diagnosis, procedures, and orders are all accurate and complete.  Jacky Kindle, FNP  [facility] [phone] (phone) [phone] (fax)  [facility]

## 2022-10-18 NOTE — Assessment & Plan Note (Signed)
Chronic, remains elevated Goal of 119/79 Continues on 100 mg losartan and propranolol er 60 mg

## 2022-10-18 NOTE — Assessment & Plan Note (Signed)
POC COVID negative; continue to monitor symptoms

## 2022-10-19 LAB — MICROALBUMIN / CREATININE URINE RATIO
Creatinine, Urine: 71.4 mg/dL
Microalb/Creat Ratio: 37 mg/g{creat} — ABNORMAL HIGH (ref 0–29)
Microalbumin, Urine: 26.4 ug/mL

## 2022-10-19 NOTE — Progress Notes (Signed)
Urine micro is much improved; this is very reassuring. Continue to recommend 6 month f/u for close mgmt.

## 2022-11-07 ENCOUNTER — Ambulatory Visit: Payer: Medicare HMO

## 2022-11-27 ENCOUNTER — Telehealth: Payer: Self-pay | Admitting: Family Medicine

## 2022-11-27 NOTE — Telephone Encounter (Signed)
The patient states her medication, Continuous Glucose Sensor (FREESTYLE LIBRE 3 SENSOR) MISC is not in stock anywhere and on backorder. She is requesting an alternative. Please assist patient further.

## 2022-12-07 ENCOUNTER — Ambulatory Visit
Admission: RE | Admit: 2022-12-07 | Discharge: 2022-12-07 | Disposition: A | Discharge status: Home / Self Care | Payer: Medicare HMO | Source: Ambulatory Visit | Attending: Emergency Medicine | Admitting: Emergency Medicine

## 2022-12-07 VITALS — BP 147/100 | HR 97 | Temp 98.0°F | Resp 14 | Ht 64.0 in | Wt 201.1 lb

## 2022-12-07 DIAGNOSIS — H9202 Otalgia, left ear: Secondary | ICD-10-CM | POA: Diagnosis not present

## 2022-12-07 MED ORDER — IPRATROPIUM BROMIDE 0.03 % NA SOLN: 2.0000 | Freq: Two times a day (BID) | NASAL | 12 refills | Status: AC

## 2022-12-07 MED ORDER — LORATADINE 10 MG PO TABS: 10.0000 mg | ORAL_TABLET | Freq: Every day | ORAL | 0 refills | Status: DC

## 2022-12-07 NOTE — ED Triage Notes (Signed)
Patient c/o left ear pain that started yesterday.  Patient denies fevers.  

## 2022-12-07 NOTE — ED Provider Notes (Signed)
[facility]    CSN: [id number] Arrival date & time: 12/07/22  1743      History   Chief Complaint Chief Complaint  Patient presents with   Otalgia    Left  Appointment    HPI Joanna Bishop is a 67 y.o. female.    Patient presents for evaluation of left-sided ear pain and fullness beginning 1 day ago.  Has also been experiencing postnasal drainage, intermittent lymph node swelling and congestion.  Possible fevers.  No known sick contacts.  Tolerating food and liquids.  History of asthma, has cough at baseline, has not worsened.  Has attempted use of peroxide which has been ineffective. Past Medical History:  Diagnosis Date   Allergy 02/26/1958   Anxiety    Arthritis    shoulders, legs   Asthma    Blood transfusion without reported diagnosis 05/05/2021   Depression    Diabetes mellitus without complication (HCC)    Hyperlipidemia    Migraine headache    2x/month   Osteoporosis 11/27/2010   Vertigo     Patient Active Problem List   Diagnosis Date Noted   Moderate persistent asthma with exacerbation 10/18/2022   Respiratory symptoms 10/18/2022   Acute UTI (urinary tract infection) 07/17/2022   Microalbuminuria due to type 2 diabetes mellitus (HCC) 05/02/2022   Migraine with aura and without status migrainosus, not intractable 06/14/2021   Peptic ulcer disease with hemorrhage 05/12/2021   Anemia due to acute blood loss 05/12/2021   Hypertension associated with diabetes (HCC) 05/02/2021   Chronic 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 05/02/2021   OAB (overactive bladder) 01/12/2021   History of total knee arthroplasty 05/09/2020   Stiffness of right knee 06/18/2018   Type 2 diabetes mellitus with stage 3a chronic kidney disease, without long-term current use of insulin (HCC) 10/17/2015   Carpal tunnel syndrome of left wrist 10/14/2015   Bipolar depression (HCC) 07/12/2015   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   Asthmatic  bronchitis 08/02/2014   Hyperlipidemia associated with type 2 diabetes mellitus (HCC) 08/02/2014   History of suicidal ideation 08/02/2014   Avitaminosis D 08/02/2014   Generalized hyperhidrosis 08/02/2014   Airway hyperreactivity 08/02/2014   Anxiety disorder 08/02/2014   Insomnia 08/02/2014   Major depressive disorder, single episode 08/02/2014   Shortness of breath 08/05/2009    Past Surgical History:  Procedure Laterality Date   ABDOMINAL HYSTERECTOMY     ARTHROPLASTY     CHOLECYSTECTOMY     FEMUR FRACTURE SURGERY     JOINT REPLACEMENT  12/16/2010   TOTAL KNEE ARTHROPLASTY Left    TUBAL LIGATION  10/08/1986    OB History   No obstetric history on file.      Home Medications    Prior to Admission medications   Medication Sig Start Date End Date Taking? Authorizing Provider  ipratropium (ATROVENT) 0.03 % nasal spray Place 2 sprays into both nostrils every 12 (twelve) hours. 12/07/22  Yes Savita Runner R, NP  loratadine (CLARITIN) 10 MG tablet Take 1 tablet (10 mg total) by mouth daily. 12/07/22  Yes Deniya Craigo, Elita Boone, NP  acetaminophen (TYLENOL) 650 MG CR tablet Take 650 mg by mouth every 8 (eight) hours as needed for pain.    [provider]  albuterol (VENTOLIN HFA) 108 (90 Base) MCG/ACT inhaler INHALE 1-2 PUFFS BY MOUTH into THE lungs EVERY 4 TO 6 HOURS AS NEEDED FOR WHEEZING AND/OR SHORTNESS OF BREATH 08/01/22   Alfredia Ferguson, PA-C  azithromycin (  ZITHROMAX) 500 MG tablet Take 1 tablet (500 mg total) by mouth daily. 10/18/22   Jacky Kindle, FNP  blood glucose meter kit and supplies Dispense based on patient and insurance preference. Use daily as directed. 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 01/23/21   Erasmo Downer, MD  budesonide-formoterol (SYMBICORT) 80-4.5 MCG/ACT inhaler Inhale 2 puffs into the lungs 2 (two) times daily. 10/17/22   Simmons-Robinson, Makiera, MD  Continuous Glucose Sensor (FREESTYLE LIBRE 3 SENSOR) MISC Place 1 sensor on the skin every 14 days. Use  to check glucose continuously 08/29/22   Drubel, Lillia Abed, PA-C  doxycycline (VIBRA-TABS) 100 MG tablet Take 1 tablet (100 mg total) by mouth 2 (two) times daily. 10/18/22   Jacky Kindle, FNP  Dulaglutide (TRULICITY) 4.5 MG/0.5ML SOPN Inject 4.5 mg into the skin once a week. 07/31/22   Alfredia Ferguson, PA-C  estradiol (ESTRACE VAGINAL) 0.1 MG/GM vaginal cream Place 1 Applicatorful vaginally 3 (three) times a week. 05/28/22   Erasmo Downer, MD  glipiZIDE (GLUCOTROL XL) 10 MG 24 hr tablet TAKE ONE TABLET BY MOUTH EVERY MORNING 10/10/22   Bacigalupo, Marzella Schlein, MD  glucose blood (ONETOUCH VERIO) test strip Use to check blood sugars once daily as instructed 02/07/21   Erasmo Downer, MD  Insulin Pen Needle (BD PEN NEEDLE NANO U/F) 32G X 4 MM MISC Use to inject insulin daily as directed 05/28/22   Drubel, Lillia Abed, PA-C  LANTUS SOLOSTAR 100 UNIT/ML Solostar Pen Inject 36 Units into the skin daily. 08/02/22   Alfredia Ferguson, PA-C  losartan (COZAAR) 100 MG tablet TAKE ONE TABLET BY MOUTH ONCE DAILY 07/02/22   Ok Edwards, Lillia Abed, PA-C  montelukast (SINGULAIR) 10 MG tablet TAKE ONE TABLET BY MOUTH EVERYDAY AT BEDTIME 07/02/22   Drubel, Lillia Abed, PA-C  Multiple Vitamin (MULTIVITAMIN WITH MINERALS) TABS tablet Take 1 tablet by mouth daily. Centrum Silver Women's 50+    [provider]  NON FORMULARY Take 1 capsule by mouth in the morning and at bedtime. AZO Bladder Control Supplement    [provider]  OneTouch Delica Lancets 33G MISC Use to check blood sugars once daily as directed. 02/07/21   Erasmo Downer, MD  oxybutynin (DITROPAN-XL) 10 MG 24 hr tablet TAKE ONE TABLET BY MOUTH EVERYDAY AT BEDTIME 07/02/22   Drubel, Lillia Abed, PA-C  predniSONE (DELTASONE) 50 MG tablet Take 1 tablet (50 mg total) by mouth daily with breakfast. 10/18/22   Jacky Kindle, FNP  propranolol ER (INDERAL LA) 60 MG 24 hr capsule TAKE ONE CAPSULE BY MOUTH ONCE DAILY 08/28/22   Drubel, Lillia Abed, PA-C  Rimegepant Sulfate  (NURTEC) 75 MG TBDP Take 1 mg by mouth daily as needed. At onset of migraine/aura Patient not taking: Reported on 10/18/2022 07/11/21   Alfredia Ferguson, PA-C  rosuvastatin (CRESTOR) 5 MG tablet TAKE ONE TABLET BY MOUTH ONCE DAILY 07/02/22   Alfredia Ferguson, PA-C    Family History Family History  Problem Relation Age of Onset   Diabetes Mother    COPD Mother    Diabetes Father    Coronary artery disease Father    Coronary artery disease Maternal Grandmother    Heart attack Maternal Grandmother    Diabetes Paternal Grandmother     Social History Social History   Tobacco Use   Smoking status: Never   Smokeless tobacco: Never  Vaping Use   Vaping status: Never Used  Substance Use Topics   Alcohol use: Not Currently    Comment: Maybe 6 beers a year   Drug  use: Never     Allergies   Penicillins   Review of Systems Review of Systems   Physical Exam Triage Vital Signs ED Triage Vitals  Encounter Vitals Group     BP 12/07/22 1804 (!) 147/100     Systolic BP Percentile --      Diastolic BP Percentile --      Pulse Rate 12/07/22 1804 97     Resp 12/07/22 1804 14     Temp 12/07/22 1804 98 F (36.7 C)     Temp Source 12/07/22 1804 Oral     SpO2 12/07/22 1804 96 %     Weight 12/07/22 1801 201 lb 1 oz (91.2 kg)     Height 12/07/22 1801 5\' 4"  (1.626 m)     Head Circumference --      Peak Flow --      Pain Score 12/07/22 1801 5     Pain Loc --      Pain Education --      Exclude from Growth Chart --    No data found.  Updated Vital Signs BP (!) 147/100 (BP Location: Left Arm)   Pulse 97   Temp 98 F (36.7 C) (Oral)   Resp 14   Ht 5\' 4"  (1.626 m)   Wt 201 lb 1 oz (91.2 kg)   SpO2 96%   BMI 34.51 kg/m   Visual Acuity Right Eye Distance:   Left Eye Distance:   Bilateral Distance:    Right Eye Near:   Left Eye Near:    Bilateral Near:     Physical Exam Constitutional:      Appearance: Normal appearance.  HENT:     Head: Normocephalic.     Right Ear:  Tympanic membrane, ear canal and external ear normal.     Left Ear: Tympanic membrane, ear canal and external ear normal.     Nose: Congestion and rhinorrhea present.     Mouth/Throat:     Mouth: Mucous membranes are moist.     Pharynx: No oropharyngeal exudate or posterior oropharyngeal erythema.  Eyes:     Extraocular Movements: Extraocular movements intact.  Pulmonary:     Effort: Pulmonary effort is normal.  Neurological:     Mental Status: She is alert and oriented to person, place, and time. Mental status is at baseline.      UC Treatments / Results  Labs (all labs ordered are listed, but only abnormal results are displayed) Labs Reviewed - No data to display  EKG   Radiology No results found.  Procedures Procedures (including critical care time)  Medications Ordered in UC Medications - No data to display  Initial Impression / Assessment and Plan / UC Course  I have reviewed the triage vital signs and the nursing notes.  Pertinent labs & imaging results that were available during my care of the patient were reviewed by me and considered in my medical decision making (see chart for details).  Otalgia of left ear  No abnormality to the tympanic membrane or ear canal, discussed with patient, etiology is most likely related to congestion, discussed, prescribed Atrovent nasal spray and Claritin, recommend over-the-counter Mucinex or pseudoephedrine for additional support, may use over-the-counter analgesics and warm compresses to the external ear for pain and advise follow-up for any significant worsening of symptoms for reevaluation Final Clinical Impressions(s) / UC Diagnoses   Final diagnoses:  Otalgia of left ear     Discharge Instructions      On exam  there are no signs of infection to your ear such as redness drainage or swelling and symptoms today are most likely result of congestion to the nose and congestion within the sinuses  Begin use of Claritin  daily which is an antihistamine and will help to minimize the amount of congestion that the body is producing  Begin use of Atrovent nasal spray every morning and every evening to further help clear out the sinuses which ideally will reduce pressure from the ears  You may also use a medicine such as Mucinex or pseudoephedrine to further help reduce symptoms  You may take Tylenol and/or Motrin as needed for pain  You may hold warm compresses to the outer ear for additional comfort  You may follow-up with his urgent care for reevaluation at any point if symptoms significantly worsen   ED Prescriptions     Medication Sig Dispense Auth. Provider   ipratropium (ATROVENT) 0.03 % nasal spray Place 2 sprays into both nostrils every 12 (twelve) hours. 30 mL Joeanna Howdyshell R, NP   loratadine (CLARITIN) 10 MG tablet Take 1 tablet (10 mg total) by mouth daily. 30 tablet Valinda Hoar, NP      PDMP not reviewed this encounter.   Valinda Hoar, NP 12/07/22 [phone]

## 2022-12-07 NOTE — Discharge Instructions (Signed)
On exam there are no signs of infection to your ear such as redness drainage or swelling and symptoms today are most likely result of congestion to the nose and congestion within the sinuses  Begin use of Claritin daily which is an antihistamine and will help to minimize the amount of congestion that the body is producing  Begin use of Atrovent nasal spray every morning and every evening to further help clear out the sinuses which ideally will reduce pressure from the ears  You may also use a medicine such as Mucinex or pseudoephedrine to further help reduce symptoms  You may take Tylenol and/or Motrin as needed for pain  You may hold warm compresses to the outer ear for additional comfort  You may follow-up with his urgent care for reevaluation at any point if symptoms significantly worsen

## 2023-01-08 ENCOUNTER — Other Ambulatory Visit: Payer: Self-pay | Admitting: Family Medicine

## 2023-01-08 DIAGNOSIS — H2512 Age-related nuclear cataract, left eye: Secondary | ICD-10-CM | POA: Diagnosis not present

## 2023-01-08 DIAGNOSIS — H2511 Age-related nuclear cataract, right eye: Secondary | ICD-10-CM | POA: Diagnosis not present

## 2023-01-08 DIAGNOSIS — H04123 Dry eye syndrome of bilateral lacrimal glands: Secondary | ICD-10-CM | POA: Diagnosis not present

## 2023-01-08 DIAGNOSIS — J301 Allergic rhinitis due to pollen: Secondary | ICD-10-CM

## 2023-01-08 DIAGNOSIS — E119 Type 2 diabetes mellitus without complications: Secondary | ICD-10-CM | POA: Diagnosis not present

## 2023-01-08 DIAGNOSIS — I152 Hypertension secondary to endocrine disorders: Secondary | ICD-10-CM

## 2023-01-08 DIAGNOSIS — G43109 Migraine with aura, not intractable, without status migrainosus: Secondary | ICD-10-CM

## 2023-01-08 LAB — HM DIABETES EYE EXAM

## 2023-01-08 NOTE — Telephone Encounter (Signed)
Medication Refill -  Most Recent Primary Care Visit:  Provider: Merita Norton T  Department: [facility]  Visit Type: OFFICE VISIT  Date: 10/18/2022  Medication: glipiZIDE (GLUCOTROL XL) 10 MG 24 hr tablet  propranolol ER (INDERAL LA) 60 MG 24 hr capsule  rosuvastatin (CRESTOR) 5 MG tablet  montelukast (SINGULAIR) 10 MG tablet   oxybutynin (DITROPAN-XL) 10 MG 24 hr tablet  losartan (COZAAR) 100 MG tablet   Has the patient contacted their pharmacy? no   Is this the correct pharmacy for this prescription? Yes  This is the patient's preferred pharmacy:  [facility] [address] [place]), [place] - [address] Phone: [phone]  Fax: [phone]     Has the prescription been filled recently? Yes  Is the patient out of the medication? Yes  Has the patient been seen for an appointment in the last year OR does the patient have an upcoming appointment? Yes  Can we respond through MyChart? Yes  Agent: Please be advised that Rx refills may take up to 3 business days. We ask that you follow-up with your pharmacy.

## 2023-01-09 MED ORDER — MONTELUKAST SODIUM 10 MG PO TABS
10.0000 mg | ORAL_TABLET | Freq: Every day | ORAL | 0 refills | Status: DC
Start: 2023-01-09 — End: 2023-04-17

## 2023-01-09 MED ORDER — GLIPIZIDE ER 10 MG PO TB24: 10.0000 mg | ORAL_TABLET | Freq: Every morning | ORAL | 0 refills | Status: DC

## 2023-01-09 MED ORDER — ROSUVASTATIN CALCIUM 5 MG PO TABS: 5.0000 mg | ORAL_TABLET | Freq: Every day | ORAL | 1 refills | Status: DC

## 2023-01-09 MED ORDER — PROPRANOLOL HCL ER 60 MG PO CP24
60.0000 mg | ORAL_CAPSULE | Freq: Every day | ORAL | 0 refills | Status: DC
Start: 2023-01-09 — End: 2023-04-17

## 2023-01-09 MED ORDER — LOSARTAN POTASSIUM 100 MG PO TABS
100.0000 mg | ORAL_TABLET | Freq: Every day | ORAL | 1 refills | Status: DC
Start: 2023-01-09 — End: 2023-04-17

## 2023-01-09 MED ORDER — OXYBUTYNIN CHLORIDE ER 10 MG PO TB24: 10.0000 mg | ORAL_TABLET | Freq: Every day | ORAL | 0 refills | Status: DC

## 2023-01-09 NOTE — Telephone Encounter (Signed)
Requested medication (s) are due for refill today- yes  Requested medication (s) are on the active medication list -yes  Future visit scheduled -yes  Last refill: glipizide- 10/10/22 #90                  Rosuvastatin - 07/02/22 #90 1RF                  Losartin - 07/02/22 #90 1RF  Notes to clinic: fail lab protocol- over 1 year-09/06/21  Requested Prescriptions  Pending Prescriptions Disp Refills   glipiZIDE (GLUCOTROL XL) 10 MG 24 hr tablet 90 tablet 0    Sig: Take 1 tablet (10 mg total) by mouth every morning.     Endocrinology:  Diabetes - Sulfonylureas Failed - 01/08/2023  4:28 PM      Failed - Cr in normal range and within 360 days    Creatinine, Ser  Date Value Ref Range Status  09/06/2021 1.17 (H) 0.57 - 1.00 mg/dL Final         Passed - HBA1C is between 0 and 7.9 and within 180 days    Hemoglobin A1C  Date Value Ref Range Status  10/18/2022 6.8 (A) 4.0 - 5.6 % Final   Hgb A1c MFr Bld  Date Value Ref Range Status  05/15/2022 8.3 (H) 4.8 - 5.6 % Final    Comment:             Prediabetes: 5.7 - 6.4          Diabetes: >6.4          Glycemic control for adults with diabetes: <7.0          Passed - Valid encounter within last 6 months    Recent Outpatient Visits           2 months ago Respiratory symptoms   [place] [facility] Merita Norton T, FNP   5 months ago Acute cystitis without hematuria   [place] [facility] Alfredia Ferguson, PA-C   7 months ago Dysuria   [place] [facility] [place], Marzella Schlein, MD   7 months ago Sepsis, due to unspecified organism, unspecified whether acute organ dysfunction present [facility])   [place] [facility] Alfredia Ferguson, PA-C   8 months ago Annual physical exam   [facility] Alfredia Ferguson, PA-C       Future Appointments             In 4 weeks Jacky Kindle, FNP  [facility]              rosuvastatin (CRESTOR) 5 MG tablet 90 tablet 1    Sig: Take 1 tablet (5 mg total) by mouth daily.     Cardiovascular:  Antilipid - Statins 2 Failed - 01/08/2023  4:28 PM      Failed - Cr in normal range and within 360 days    Creatinine, Ser  Date Value Ref Range Status  09/06/2021 1.17 (H) 0.57 - 1.00 mg/dL Final         Failed - Lipid Panel in normal range within the last 12 months    Cholesterol, Total  Date Value Ref Range Status  09/06/2021 129 100 - 199 mg/dL Final   LDL Chol Calc (NIH)  Date Value Ref Range Status  09/06/2021 49 0 - 99 mg/dL Final   HDL  Date Value Ref Range Status  09/06/2021 36 (L) >39 mg/dL  Final   Triglycerides  Date Value Ref Range Status  09/06/2021 288 (H) 0 - 149 mg/dL Final         Passed - Patient is not pregnant      Passed - Valid encounter within last 12 months    Recent Outpatient Visits           2 months ago Respiratory symptoms   [place] [facility] Merita Norton T, FNP   5 months ago Acute cystitis without hematuria   [facility] Alfredia Ferguson, PA-C   7 months ago Dysuria   [place] [facility] Erasmo Downer, MD   7 months ago Sepsis, due to unspecified organism, unspecified whether acute organ dysfunction present [facility])   [place] [facility] Alfredia Ferguson, PA-C   8 months ago Annual physical exam   [facility] Alfredia Ferguson, PA-C       Future Appointments             In 4 weeks Jacky Kindle, FNP [facility]             losartan (COZAAR) 100 MG tablet 90 tablet 1    Sig: Take 1 tablet (100 mg total) by mouth daily.     Cardiovascular:  Angiotensin Receptor Blockers Failed - 01/08/2023  4:28 PM      Failed - Cr in normal range and within 180 days    Creatinine, Ser  Date Value Ref Range Status  09/06/2021 1.17 (H) 0.57 - 1.00 mg/dL Final         Failed -  K in normal range and within 180 days    Potassium  Date Value Ref Range Status  09/06/2021 4.5 3.5 - 5.2 mmol/L Final         Failed - Last BP in normal range    BP Readings from Last 1 Encounters:  12/07/22 (!) 147/100         Passed - Patient is not pregnant      Passed - Valid encounter within last 6 months    Recent Outpatient Visits           2 months ago Respiratory symptoms   [place] [facility] Merita Norton T, FNP   5 months ago Acute cystitis without hematuria   [place] [facility] Alfredia Ferguson, PA-C   7 months ago Dysuria   [place] [facility] [place], Marzella Schlein, MD   7 months ago Sepsis, due to unspecified organism, unspecified whether acute organ dysfunction present [facility])   [place] [facility] Alfredia Ferguson, PA-C   8 months ago Annual physical exam   [facility] Ferguson, PA-C       Future Appointments             In 4 weeks Jacky Kindle, FNP [facility]            Signed Prescriptions Disp Refills   propranolol ER (INDERAL LA) 60 MG 24 hr capsule 90 capsule 0    Sig: Take 1 capsule (60 mg total) by mouth daily.     Cardiovascular:  Beta Blockers Failed - 01/08/2023  4:28 PM      Failed - Last BP in normal range    BP Readings from Last 1 Encounters:  12/07/22 (!) 147/100  Passed - Last Heart Rate in normal range    Pulse Readings from Last 1 Encounters:  12/07/22 97         Passed - Valid encounter within last 6 months    Recent Outpatient Visits           2 months ago Respiratory symptoms   [place] [facility] Merita Norton T, FNP   5 months ago Acute cystitis without hematuria   [place] [facility] Alfredia Ferguson, PA-C   7 months ago Dysuria   [place] [facility]asmo Downer, MD   7 months ago Sepsis, due to  unspecified organism, unspecified whether acute organ dysfunction present [facility])   [place] [facility] Alfredia Ferguson, PA-C   8 months ago Annual physical exam   [facility] Alfredia Ferguson, PA-C       Future Appointments             In 4 weeks Jacky Kindle, FNP [facility]             montelukast (SINGULAIR) 10 MG tablet 90 tablet 0    Sig: Take 1 tablet (10 mg total) by mouth at bedtime.     Pulmonology:  Leukotriene Inhibitors Passed - 01/08/2023  4:28 PM      Passed - Valid encounter within last 12 months    Recent Outpatient Visits           2 months ago Respiratory symptoms   [place] [facility] Mer[facility] Norton T, FNP   5 months ago Acute cystitis without hematuria   [facility] Ferguson, PA-C   7 months ago Dysuria   [place] [facility] Erasmo Downer, MD   7 months ago Sepsis, due to unspecified organism, unspecified whether acute organ dysfunction present [facility])   [place] [facility] Alfredia Ferguson, PA-C   8 months ago Annual physical exam   [facility], PA-C       Future Appointments             In 4 weeks Jacky Kindle, FNP [place] [facility], [facility]             oxybutynin (DITROPAN-XL) 10 MG 24 hr tablet 90 tablet 0    Sig: Take 1 tablet (10 mg total) by mouth at bedtime.     Urology:  Bladder Agents Passed - 01/08/2023  4:28 PM      Passed - Valid encounter within last 12 months    Recent Outpatient Visits           2 months ago Respiratory symptoms   [place] Mer[facility] T, FNP   5 months ago Acute cystitis without hematuria   [place] [facility] Alfredia Ferguson, PA-C   7 months ago Dysuria   [facility] Erasmo Downer, MD   7  months ago Sepsis, due to unspecified organism, unspecified whether acute organ dysfunction present [facility])   [place] [facility] Alfredia Ferguson, PA-C   8 months ago Annual physical exam   [facility] Alfred[facility] Ferguson, PA-C       Future Appointments             In 4 weeks Jacky Kindle, FNP [facility]  Requested Prescriptions  Pending Prescriptions Disp Refills   glipiZIDE (GLUCOTROL XL) 10 MG 24 hr tablet 90 tablet 0    Sig: Take 1 tablet (10 mg total) by mouth every morning.     Endocrinology:  Diabetes - Sulfonylureas Failed - 01/08/2023  4:28 PM      Failed - Cr in normal range and within 360 days    Creatinine, Ser  Date Value Ref Range Status  09/06/2021 1.17 (H) 0.57 - 1.00 mg/dL Final         Passed - HBA1C is between 0 and 7.9 and within 180 days    Hemoglobin A1C  Date Value Ref Range Status  10/18/2022 6.8 (A) 4.0 - 5.6 % Final   Hgb A1c MFr Bld  Date Value Ref Range Status  05/15/2022 8.3 (H) 4.8 - 5.6 % Final    Comment:             Prediabetes: 5.7 - 6.4          Diabetes: >6.4          Glycemic control for adults with diabetes: <7.0          Passed - Valid encounter within last 6 months    Recent Outpatient Visits           2 months ago Respiratory symptoms   [place] [facility] Mer[facility] Norton T, FNP   5 months ago Acute cystitis without hematuria   [place] [facility] Alfredia Ferguson, PA-C   7 months ago Dysuria   [place] [facility] [place], Marzella Schlein, MD   7 months ago Sepsis, due to unspecified organism, unspecified whether acute organ dysfunction present [facility])   [place] [facility] Alfredia Ferguson, PA-C   8 months ago Annual physical exam   [facility] Alfred[facility] Ferguson, PA-C       Future Appointments             In 4 weeks Jacky Kindle,  FNP [place] [facility]             rosuvastatin (CRESTOR) 5 MG tablet 90 tablet 1    Sig: Take 1 tablet (5 mg total) by mouth daily.     Cardiovascular:  Antilipid - Statins 2 Failed - 01/08/2023  4:28 PM      Failed - Cr in normal range and within 360 days    Creatinine, Ser  Date Value Ref Range Status  09/06/2021 1.17 (H) 0.57 - 1.00 mg/dL Final         Failed - Lipid Panel in normal range within the last 12 months    Cholesterol, Total  Date Value Ref Range Status  09/06/2021 129 100 - 199 mg/dL Final   LDL Chol Calc (NIH)  Date Value Ref Range Status  09/06/2021 49 0 - 99 mg/dL Final   HDL  Date Value Ref Range Status  09/06/2021 36 (L) >39 mg/dL Final   Triglycerides  Date Value Ref Range Status  09/06/2021 288 (H) 0 - 149 mg/dL Final         Passed - Patient is not pregnant      Passed - Valid encounter within last 12 months    Recent Outpatient Visits           2 months ago Respiratory symptoms   [place] [facility] Merita Norton T, FNP   5 months ago Acute cystitis without  hematuria   [place] [facility] Alfredia Ferguson, [place]   7 months ago Dysuria   [place] [facility] [place], Marzella Schlein, MD   7 months ago Sepsis, due to unspecified organism, unspecified whether acute organ dysfunction present [facility])   [place] [facility] Alfredia Ferguson, PA-C   8 months ago Annual physical exam   [place] [facility] Alfredia Ferguson, PA-C       Future Appointments             In 4 weeks Jacky Kindle, FNP [place] [facility], [facility]             losartan (COZAAR) 100 MG tablet 90 tablet 1    Sig: Take 1 tablet (100 mg total) by mouth daily.     Cardiovascular:  Angiotensin Receptor Blockers Failed - 01/08/2023  4:28 PM      Failed - Cr in normal range and within 180 days    Creatinine, Ser  Date Value Ref Range Status   09/06/2021 1.17 (H) 0.57 - 1.00 mg/dL Final         Failed - K in normal range and within 180 days    Potassium  Date Value Ref Range Status  09/06/2021 4.5 3.5 - 5.2 mmol/L Final         Failed - Last BP in normal range    BP Readings from Last 1 Encounters:  12/07/22 (!) 147/100         Passed - Patient is not pregnant      Passed - Valid encounter within last 6 months    Recent Outpatient Visits           2 months ago Respiratory symptoms   [facility] T, FNP   5 months ago Acute cystitis without hematuria   [facility] Alfred[facility], PA-C   7 months ago Dysuria   [place] [facility] [place], Marzella Schlein, MD   7 months ago Sepsis, due to unspecified organism, unspecified whether acute organ dysfunction present [facility])   [place] [facility]ia Ferguson, PA-C   8 months ago Annual physical exam   [facility] Alfredia Ferguson, PA-C       Future Appointments             In 4 weeks Jacky Kindle, FNP [facility], [facility]            Signed Prescriptions Disp Refills   propranolol ER (INDERAL LA) 60 MG 24 hr capsule 90 capsule 0    Sig: Take 1 capsule (60 mg total) by mouth daily.     Cardiovascular:  Beta Blockers Failed - 01/08/2023  4:28 PM      Failed - Last BP in normal range    BP Readings from Last 1 Encounters:  12/07/22 (!) 147/100         Passed - Last Heart Rate in normal range    Pulse Readings from Last 1 Encounters:  12/07/22 97         Passed - Valid encounter within last 6 months    Recent Outpatient Visits           2 months ago Respiratory symptoms   [facility] Mer[facility] Norton T, FNP   5 months ago Acute cystitis without hematuria   [place] [facility]  Alfredia Ferguson, PA-C   7 months ago Dysuria   [facility] [place], Marzella Schlein, MD   7 months ago Sepsis, due to unspecified organism, unspecified whether acute organ dysfunction present [facility])   [place] [facility] Alfredia Ferguson, PA-C   8 months ago Annual physical exam   [facility] Alfredia Ferguson, PA-C       Future Appointments             In 4 weeks Jacky Kindle, FNP [place] [facility]             montelukast (SINGULAIR) 10 MG tablet 90 tablet 0    Sig: Take 1 tablet (10 mg total) by mouth at bedtime.     Pulmonology:  Leukotriene Inhibitors Passed - 01/08/2023  4:28 PM      Passed - Valid encounter within last 12 months    Recent Outpatient Visits           2 months ago Respiratory symptoms   [place] [facility] Merita Norton T, FNP   5 months ago Acute cystitis without hematuria   [facility] Alfred[facility] Ferguson, PA-C   7 months ago Dysuria   [place] [facility] Erasmo Downer, MD   7 months ago Sepsis, due to unspecified organism, unspecified whether acute organ dysfunction present [facility])   [place] [facility] Alfredia Ferguson, PA-C   8 months ago Annual physical exam   [facility] Alfredia Ferguson, PA-C       Future Appointments             In 4 weeks Jacky Kindle, FNP [place] [facility]             oxybutynin (DITROPAN-XL) 10 MG 24 hr tablet 90 tablet 0    Sig: Take 1 tablet (10 mg total) by mouth at bedtime.     Urology:  Bladder Agents Passed - 01/08/2023  4:28 PM      Passed - Valid encounter within last 12 months    Recent Outpatient Visits           2 months ago Respiratory symptoms   [place] [facility] Merita Norton T, FNP   5 months ago Acute cystitis without hematuria   [facility] Alfred[facility], PA-C   7 months ago Dysuria   [facility] Erasmo Downer, MD   7 months ago Sepsis, due to unspecified organism, unspecified whether acute organ dysfunction present [facility])   [facility] Alfredia Ferguson, PA-C   8 months ago Annual physical exam   [facility], PA-C       Future Appointments             In 4 weeks Jacky Kindle, FNP [facility], [facility]

## 2023-01-09 NOTE — Telephone Encounter (Signed)
Requested Prescriptions  Pending Prescriptions Disp Refills   glipiZIDE (GLUCOTROL XL) 10 MG 24 hr tablet 90 tablet 0    Sig: Take 1 tablet (10 mg total) by mouth every morning.     Endocrinology:  Diabetes - Sulfonylureas Failed - 01/08/2023  4:28 PM      Failed - Cr in normal range and within 360 days    Creatinine, Ser  Date Value Ref Range Status  09/06/2021 1.17 (H) 0.57 - 1.00 mg/dL Final         Passed - HBA1C is between 0 and 7.9 and within 180 days    Hemoglobin A1C  Date Value Ref Range Status  10/18/2022 6.8 (A) 4.0 - 5.6 % Final   Hgb A1c MFr Bld  Date Value Ref Range Status  05/15/2022 8.3 (H) 4.8 - 5.6 % Final    Comment:             Prediabetes: 5.7 - 6.4          Diabetes: >6.4          Glycemic control for adults with diabetes: <7.0          Passed - Valid encounter within last 6 months    Recent Outpatient Visits           2 months ago Respiratory symptoms   [place] [facility] Merita Norton T, FNP   5 months ago Acute cystitis without hematuria   [place] [facility] Alfredia Ferguson, PA-C   7 months ago Dysuria   [place] [facility] [place], Marzella Schlein, MD   7 months ago Sepsis, due to unspecified organism, unspecified whether acute organ dysfunction present [facility])   [facility] Alfredia Ferguson, PA-C   8 months ago Annual physical exam   [facility] Alfredia Ferguson, PA-C       Future Appointments             In 4 weeks Jacky Kindle, FNP [facility]             propranolol ER (INDERAL LA) 60 MG 24 hr capsule 90 capsule 0    Sig: Take 1 capsule (60 mg total) by mouth daily.     Cardiovascular:  Beta Blockers Failed - 01/08/2023  4:28 PM      Failed - Last BP in normal range    BP Readings from Last 1 Encounters:  12/07/22 (!) 147/100         Passed - Last Heart Rate in normal range    Pulse  Readings from Last 1 Encounters:  12/07/22 97         Passed - Valid encounter within last 6 months    Recent Outpatient Visits           2 months ago Respiratory symptoms   [place] [facility] Merita Norton T, FNP   5 months ago Acute cystitis without hematuria   [facility] Alfred[facility] Ferguson, PA-C   7 months ago Dysuria   [place] [facility] Erasmo Downer, MD   7 months ago Sepsis, due to unspecified organism, unspecified whether acute organ dysfunction present [facility])   [place] Alfredia Ferguson, PA-C   8 months ago Annual physical exam   [facility] Alfredia Ferguson, [place]       Future Appointments  In 4 weeks Jacky Kindle, FNP [place] [facility]             rosuvastatin (CRESTOR) 5 MG tablet 90 tablet 1    Sig: Take 1 tablet (5 mg total) by mouth daily.     Cardiovascular:  Antilipid - Statins 2 Failed - 01/08/2023  4:28 PM      Failed - Cr in normal range and within 360 days    Creatinine, Ser  Date Value Ref Range Status  09/06/2021 1.17 (H) 0.57 - 1.00 mg/dL Final         Failed - Lipid Panel in normal range within the last 12 months    Cholesterol, Total  Date Value Ref Range Status  09/06/2021 129 100 - 199 mg/dL Final   LDL Chol Calc (NIH)  Date Value Ref Range Status  09/06/2021 49 0 - 99 mg/dL Final   HDL  Date Value Ref Range Status  09/06/2021 36 (L) >39 mg/dL Final   Triglycerides  Date Value Ref Range Status  09/06/2021 288 (H) 0 - 149 mg/dL Final         Passed - Patient is not pregnant      Passed - Valid encounter within last 12 months    Recent Outpatient Visits           2 months ago Respiratory symptoms   [place] [facility] Merita Norton T, FNP   5 months ago Acute cystitis without hematuria   [place] [facility] Alfredia Ferguson,  PA-C   7 months ago Dysuria   [place] [facility] [place], Marzella Schlein, MD   7 months ago Sepsis, due to unspecified organism, unspecified whether acute organ dysfunction present [facility])   [place] [facility] Alfredia Ferguson, PA-C   8 months ago Annual physical exam   [facility] Alfredia Ferguson, PA-C       Future Appointments             In 4 weeks Jacky Kindle, FNP [facility]             montelukast (SINGULAIR) 10 MG tablet 90 tablet 0    Sig: Take 1 tablet (10 mg total) by mouth at bedtime.     Pulmonology:  Leukotriene Inhibitors Passed - 01/08/2023  4:28 PM      Passed - Valid encounter within last 12 months    Recent Outpatient Visits           2 months ago Respiratory symptoms   [place] [facility] Merita Norton T, FNP   5 months ago Acute cystitis without hematuria   [facility] Alfredia Ferguson, PA-C   7 months ago Dysuria   [place] [facility] Erasmo Downer, MD   7 months ago Sepsis, due to unspecified organism, unspecified whether acute organ dysfunction present [facility])   [place] [facility] Alfredia Ferguson, PA-C   8 months ago Annual physical exam   [facility]ia Ferguson, PA-C       Future Appointments             In 4 weeks Jacky Kindle, FNP [facility]             oxybutynin (DITROPAN-XL) 10 MG 24 hr tablet 90 tablet 0    Sig: Take 1 tablet (  10 mg total) by mouth at bedtime.     Urology:  Bladder Agents Passed - 01/08/2023  4:28 PM      Passed - Valid encounter within last 12 months    Recent Outpatient Visits           2 months ago Respiratory symptoms   [place] [facility] Merita Norton T, FNP   5 months ago Acute cystitis without hematuria   [facility] Alfredia Ferguson, PA-C   7 months ago Dysuria   [facility] Erasmo Downer, MD   7 months ago Sepsis, due to unspecified organism, unspecified whether acute organ dysfunction present [facility])   [place] [facility] Alfredia Ferguson, PA-C   8 months ago Annual physical exam   [facility] Alfredia Ferguson, PA-C       Future Appointments             In 4 weeks Jacky Kindle, FNP [facility]             losartan (COZAAR) 100 MG tablet 90 tablet 1    Sig: Take 1 tablet (100 mg total) by mouth daily.     Cardiovascular:  Angiotensin Receptor Blockers Failed - 01/08/2023  4:28 PM      Failed - Cr in normal range and within 180 days    Creatinine, Ser  Date Value Ref Range Status  09/06/2021 1.17 (H) 0.57 - 1.00 mg/dL Final         Failed - K in normal range and within 180 days    Potassium  Date Value Ref Range Status  09/06/2021 4.5 3.5 - 5.2 mmol/L Final         Failed - Last BP in normal range    BP Readings from Last 1 Encounters:  12/07/22 (!) 147/100         Passed - Patient is not pregnant      Passed - Valid encounter within last 6 months    Recent Outpatient Visits           2 months ago Respiratory symptoms   [place] [facility] Merita Norton T, FNP   5 months ago Acute cystitis without hematuria   [place] [facility] Alfredia Ferguson, PA-C   7 months ago Dysuria   [place] [facility] Erasmo Downer, MD   7 months ago Sepsis, due to unspecified organism, unspecified whether acute organ dysfunction present [facility])   [facility] Alfredia Ferguson, PA-C   8 months ago Annual physical exam   [facility] Alfredia Ferguson, PA-C       Future Appointments             In 4 weeks Jacky Kindle, FNP [facility],  [facility]

## 2023-01-16 ENCOUNTER — Telehealth: Payer: Self-pay | Admitting: Pharmacist

## 2023-01-16 ENCOUNTER — Other Ambulatory Visit: Payer: Self-pay | Admitting: Pharmacist

## 2023-01-16 DIAGNOSIS — N1831 Chronic kidney disease, stage 3a: Secondary | ICD-10-CM

## 2023-01-16 MED ORDER — FREESTYLE LIBRE 2 SENSOR MISC: 12 refills | Status: DC

## 2023-01-16 NOTE — Patient Instructions (Signed)
Goals Addressed             This Visit's Progress    Pharmacy Goals       Our goal A1c is less than 7%. This corresponds with fasting sugars less than 130 and 2 hour after meal sugars less than 180. Please keep a log of your results when checking your blood sugar   Our goal bad cholesterol, or LDL, is less than 70 . This is why it is important to continue taking your rosuvastatin.  Check your blood pressure twice weekly, and any time you have concerning symptoms like headache, chest pain, dizziness, shortness of breath, or vision changes.   Our goal is less than 130/80.  To appropriately check your blood pressure, make sure you do the following:  1) Avoid caffeine, exercise, or tobacco products for 30 minutes before checking. Empty your bladder. 2) Sit with your back supported in a flat-backed chair. Rest your arm on something flat (arm of the chair, table, etc). 3) Sit still with your feet flat on the floor, resting, for at least 5 minutes.  4) Check your blood pressure. Take 1-2 readings.  5) Write down these readings and bring with you to any provider appointments.  Bring your home blood pressure machine with you to a provider's office for accuracy comparison at least once a year.   Make sure you take your blood pressure medications before you come to any office visit, even if you were asked to fast for labs.   Adahlia Stembridge Delles, PharmD, BCACP [facility] [phone]          

## 2023-01-16 NOTE — Progress Notes (Signed)
01/16/2023 Name: Joanna Bishop MRN: [id number] DOB: 04/14/1955  Chief Complaint  Patient presents with   Medication Management   Medication Adherence    ELLENI Bishop is a 67 y.o. year old female who presented for a telephone visit.   They were referred to the pharmacist by their PCP for assistance in managing diabetes.    Subjective:  Care Team: Primary Care Provider: Jacky Kindle, FNP ; Next Scheduled Visit: 02/07/2023  Medication Access/Adherence  Current Pharmacy:  [facility] [address] ([address]), [place] - [address] [place] ([place]) [place] [address] Phone: [phone] Fax: [phone]  [facility] [place], [place] - [address] [facility] [address] [place] [address] Phone: [phone] Fax: [phone]   Patient reports affordability concerns with their medications: No  Patient reports access/transportation concerns to their pharmacy: No  Patient reports adherence concerns with their medications:  No     Note patient currently uses both [facility] and [facility]   Uses weekly pillbox for AM and PM medicine and Dorothyann Gibbs App for daily reminders on her phone   Reports has recently had a death in the family; reports has been less consistent with monitoring recently  Diabetes:   Current medications:  - Trulicity 4.5 mg weekly on Mondays - glipizide ER 10 mg daily every morning - Lantus 36 units daily   Medications tried in the past: metformin (GI side effects)   Reports that she has been unable to use Freestyle Libre 3 continuous blood glucose monitor (CGM) recently as sensors have been out of stock, but has been monitoring with her glucometer  Reports recent morning fasting readings ranging: 120s-130s  Patient denies hypoglycemic s/sx including dizziness, shakiness, sweating - Confirms picked up glucose tablets and carries these with her    Current physical  activity: walking limited by knee pain   Statin: rosuvastatin 5 mg daily   Asthma:   Current medications: - Symbicort 80-4.5 mcg inhaler - 2 puffs twice daily - albuterol inhaler 1-2 puffs every 4-6 hours as needed for wheezing and/or shortness of breath - montelukast 10 mg QHS   Avoids triggers (going outside heat, overexertion, allergens in Spring and Fall)     Hypertension:   Current medications:  - losartan 100 mg daily - propranolol ER 60 mg daily (takes for migraine prevention)     Patient has a home upper arm home BP cuff Denies checking home blood pressure recently   Patient denies hypotensive s/sx including dizziness, lightheadedness as long as takes positional changes slowly.    Current physical activity: walking limited by knee pain  Objective:  Lab Results  Component Value Date   HGBA1C 6.8 (A) 10/18/2022    Lab Results  Component Value Date   CREATININE 1.17 (H) 09/06/2021   BUN 23 09/06/2021   NA 137 09/06/2021   K 4.5 09/06/2021   CL 103 09/06/2021   CO2 19 (L) 09/06/2021    Lab Results  Component Value Date   CHOL 129 09/06/2021   HDL 36 (L) 09/06/2021   LDLCALC 49 09/06/2021   TRIG 288 (H) 09/06/2021   CHOLHDL 3.6 09/06/2021   BP Readings from Last 3 Encounters:  12/07/22 (!) 147/100  10/18/22 131/63  07/17/22 123/64   Pulse Readings from Last 3 Encounters:  12/07/22 97  10/18/22 96  07/17/22 (!) 110     Medications Reviewed Today     Reviewed by Manuela Neptune,  RPH-CPP (Pharmacist) on 01/16/23 at 0911  Med List Status: <None>   Medication Order Taking? Sig Documenting Provider Last Dose Status Informant  acetaminophen (TYLENOL) 650 MG CR tablet [id number]  Take 650 mg by mouth every 8 (eight) hours as needed for pain. [provider]  Active   albuterol (VENTOLIN HFA) 108 (90 Base) MCG/ACT inhaler [id number]  INHALE 1-2 PUFFS BY MOUTH into THE lungs EVERY 4 TO 6 HOURS AS NEEDED FOR WHEEZING AND/OR SHORTNESS OF  BREATH Alfredia Ferguson, PA-C  Active   blood glucose meter kit and supplies [id number]  Dispense based on patient and insurance preference. Use daily as directed. 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 Erasmo Downer, MD  Active   budesonide-formoterol [facility]) 80-4.5 MCG/ACT inhaler [id number]  Inhale 2 puffs into the lungs 2 (two) times daily. Simmons-Robinson, Makiera, MD  Active   Continuous Glucose Sensor (FREESTYLE LIBRE 3 SENSOR) [place] [id number]  Place 1 sensor on the skin every 14 days. Use to check glucose continuously Drubel, Lillia Abed, PA-C  Active   Dulaglutide (TRULICITY) 4.5 MG/0.5ML SOPN [id number] Yes Inject 4.5 mg into the skin once a week. Alfredia Ferguson, PA-C Taking Active   estradiol (ESTRACE VAGINAL) 0.1 MG/GM vaginal cream [id number]  Place 1 Applicatorful vaginally 3 (three) times a week. Erasmo Downer, MD  Active            Med Note Ronney Asters, [facility] A   Wed Sep 19, 2022  8:59 AM) Uses as needed  glipiZIDE (GLUCOTROL XL) 10 MG 24 hr tablet [id number] Yes Take 1 tablet (10 mg total) by mouth every morning. Jacky Kindle, FNP Taking Active   glucose blood [facility] VERIO) test strip [id number]  Use to check blood sugars once daily as instructed Bacigalupo, Marzella Schlein, MD  Active   Insulin Pen Needle (BD PEN NEEDLE NANO U/F) 32G X 4 MM MISC [id number]  Use to inject insulin daily as directed Alfredia Ferguson, PA-C  Active   ipratropium (ATROVENT) 0.03 % nasal spray [id number]  Place 2 sprays into both nostrils every 12 (twelve) hours. Valinda Hoar, NP  Active   LANTUS SOLOSTAR 100 UNIT/ML Solostar Pen [id number] Yes Inject 36 Units into the skin daily. Alfredia Ferguson, PA-C Taking Active   loratadine (CLARITIN) 10 MG tablet [id number] No Take 1 tablet (10 mg total) by mouth daily.  Patient not taking: Reported on 01/16/2023   Valinda Hoar, NP Not Taking Active   losartan (COZAAR) 100 MG tablet [id number] Yes Take 1 tablet (100 mg total) by mouth daily. Jacky Kindle, FNP Taking Active    montelukast (SINGULAIR) 10 MG tablet [id number]  Take 1 tablet (10 mg total) by mouth at bedtime. Jacky Kindle, FNP  Active   Multiple Vitamin (MULTIVITAMIN WITH MINERALS) TABS tablet [id number]  Take 1 tablet by mouth daily. Centrum Silver Women's 50+ [provider]  Active   NON FORMULARY [id number]  Take 1 capsule by mouth in the morning and at bedtime. AZO Bladder Control Supplement [provider]  Active   OneTouch Delica Lancets 33G MISC [id number]  Use to check blood sugars once daily as directed. Erasmo Downer, MD  Active   oxybutynin (DITROPAN-XL) 10 MG 24 hr tablet [id number]  Take 1 tablet (10 mg total) by mouth at bedtime. Jacky Kindle, FNP  Active   propranolol ER (INDERAL LA) 60 MG 24 hr capsule [id number]  Take 1 capsule (60 mg total) by mouth daily. Jacky Kindle, FNP  Active   Rimegepant Sulfate (NURTEC)  75 MG TBDP [id number]  Take 1 mg by mouth daily as needed. At onset of migraine/aura  Patient not taking: Reported on 10/18/2022   Alfredia Ferguson, PA-C  Active   rosuvastatin (CRESTOR) 5 MG tablet [id number]  Take 1 tablet (5 mg total) by mouth daily. Jacky Kindle, FNP  Active               Assessment/Plan:   Diabetes: - Have reviewed long term cardiovascular and renal outcomes of uncontrolled blood sugar - Have reviewed goal A1c, goal fasting, and goal 2 hour post prandial glucose - Reviewed dietary modifications including Encourage patient to have well-balanced meals and snacks spread throughout the day, while controlling carbohydrate portion sizes Advise patient against skipping meals Encourage patient to review nutrition labels for total carbohydrate content of foods - Patient interested in using Freestyle Libre 2 CGM, while unable to obtain [employer] 3 due to back order.   Send order for [employer] sensors to [employer] for patient per protocol Counsel patient that to use [employer], will need to [employer] 2 App to her phone and to scan sensor every 8 hours to capture her data - Recommend to continue to use Freestyle Libre CGM to monitor blood sugar/as feedback on dietary choices - Recommend to check glucose with fingerstick check when needed for symptoms and as back up to CGM. Patient to contact office if needed for readings outside of established parameters or symptoms     Hypertension: - Have encouraged patient to continue to take positional changes slowly - Recommend to restart monitoring home blood pressure, keep log of results and have this record to review at upcoming medical appointments. Patient to contact provider office sooner if needed for readings outside of established parameters or symptoms     Asthma: - Patient to continue to take medications consistently and contact office if needed for increased albuterol use/symptoms     Follow Up Plan: Clinical Pharmacist will follow up with patient by telephone on 03/27/2023 at 11:30 AM     Estelle Grumbles, PharmD, [facility] [phone]

## 2023-01-16 NOTE — Progress Notes (Signed)
   Outreach Note  01/16/2023 Name: Joanna Bishop MRN: [id number] DOB: [birth year]  Referred by: Jacky Kindle, FNP  Was unable to reach patient via telephone today and have left HIPAA compliant voicemail asking patient to return my call.    Follow Up Plan: Will collaborate with Care Guide to outreach to schedule follow up with me  Estelle Grumbles, PharmD, [facility] [phone]

## 2023-01-23 DIAGNOSIS — H2512 Age-related nuclear cataract, left eye: Secondary | ICD-10-CM | POA: Diagnosis not present

## 2023-02-06 ENCOUNTER — Encounter: Payer: Self-pay | Admitting: Ophthalmology

## 2023-02-07 ENCOUNTER — Ambulatory Visit: Payer: Medicare HMO | Admitting: Family Medicine

## 2023-02-08 ENCOUNTER — Encounter: Payer: Self-pay | Admitting: Ophthalmology

## 2023-02-08 NOTE — Anesthesia Preprocedure Evaluation (Addendum)
Anesthesia Evaluation    Airway        Dental   Pulmonary           Cardiovascular      Neuro/Psych    GI/Hepatic   Endo/Other  diabetes    Renal/GU      Musculoskeletal   Abdominal   Peds  Hematology   Anesthesia Other Findings Depression Anxiety Asthma  Hyperlipidemia  Migraine headache Vertigo  Arthritis Allergy  Osteoporosis Blood transfusion without reported diagnosis  Type 2 diabetes mellitus with stage 3a chronic kidney disease     Reproductive/Obstetrics                              Anesthesia Physical Anesthesia Plan  ASA: 3  Anesthesia Plan: MAC   Post-op Pain Management:    Induction: Intravenous  PONV Risk Score and Plan:   Airway Management Planned: Natural Airway and Nasal Cannula  Additional Equipment:   Intra-op Plan:   Post-operative Plan:   Informed Consent: I have reviewed the patients History and Physical, chart, labs and discussed the procedure including the risks, benefits and alternatives for the proposed anesthesia with the patient or authorized representative who has indicated his/her understanding and acceptance.     Dental Advisory Given  Plan Discussed with: Anesthesiologist, CRNA and Surgeon  Anesthesia Plan Comments: (Patient consented for risks of anesthesia including but not limited to:  - adverse reactions to medications - damage to eyes, teeth, lips or other oral mucosa - nerve damage due to positioning  - sore throat or hoarseness - Damage to heart, brain, nerves, lungs, other parts of body or loss of life  Patient voiced understanding and assent.)         Anesthesia Quick Evaluation

## 2023-02-12 ENCOUNTER — Telehealth: Payer: Self-pay | Admitting: Family Medicine

## 2023-02-12 ENCOUNTER — Ambulatory Visit: Payer: Medicare HMO | Admitting: Family Medicine

## 2023-02-12 DIAGNOSIS — E1122 Type 2 diabetes mellitus with diabetic chronic kidney disease: Secondary | ICD-10-CM

## 2023-02-12 NOTE — Telephone Encounter (Signed)
Patient needs her Trulicity sent in to [facility]  Mail order

## 2023-02-13 MED ORDER — TRULICITY 4.5 MG/0.5ML ~~LOC~~ SOAJ: 4.5000 mg | SUBCUTANEOUS | 1 refills | Status: DC

## 2023-02-13 NOTE — Discharge Instructions (Signed)

## 2023-02-13 NOTE — Addendum Note (Signed)
Addended by: Marjie Skiff on: 02/13/2023 08:44 AM   Modules accepted: Orders

## 2023-02-13 NOTE — Telephone Encounter (Signed)
Prescription sent in to the [place] as requested

## 2023-02-18 ENCOUNTER — Ambulatory Visit: Payer: Medicare HMO | Admitting: Family Medicine

## 2023-02-21 ENCOUNTER — Other Ambulatory Visit: Payer: Self-pay

## 2023-02-21 ENCOUNTER — Encounter: Payer: Self-pay | Admitting: Ophthalmology

## 2023-02-21 ENCOUNTER — Ambulatory Visit
Admission: RE | Admit: 2023-02-21 | Discharge: 2023-02-21 | Disposition: A | Discharge status: Home / Self Care | Payer: Medicare HMO | Source: Ambulatory Visit | Attending: Ophthalmology | Admitting: Ophthalmology

## 2023-02-21 ENCOUNTER — Encounter
Admission: RE | Disposition: A | Discharge status: Home / Self Care | Payer: Self-pay | Source: Ambulatory Visit | Attending: Ophthalmology

## 2023-02-21 ENCOUNTER — Ambulatory Visit: Payer: Medicare HMO | Admitting: Anesthesiology

## 2023-02-21 DIAGNOSIS — H2512 Age-related nuclear cataract, left eye: Secondary | ICD-10-CM | POA: Insufficient documentation

## 2023-02-21 DIAGNOSIS — E1122 Type 2 diabetes mellitus with diabetic chronic kidney disease: Secondary | ICD-10-CM | POA: Insufficient documentation

## 2023-02-21 DIAGNOSIS — Z7985 Long-term (current) use of injectable non-insulin antidiabetic drugs: Secondary | ICD-10-CM | POA: Diagnosis not present

## 2023-02-21 DIAGNOSIS — Z794 Long term (current) use of insulin: Secondary | ICD-10-CM | POA: Diagnosis not present

## 2023-02-21 DIAGNOSIS — N1831 Chronic kidney disease, stage 3a: Secondary | ICD-10-CM | POA: Diagnosis not present

## 2023-02-21 DIAGNOSIS — J45909 Unspecified asthma, uncomplicated: Secondary | ICD-10-CM | POA: Insufficient documentation

## 2023-02-21 DIAGNOSIS — Z7984 Long term (current) use of oral hypoglycemic drugs: Secondary | ICD-10-CM | POA: Insufficient documentation

## 2023-02-21 DIAGNOSIS — D631 Anemia in chronic kidney disease: Secondary | ICD-10-CM | POA: Diagnosis not present

## 2023-02-21 DIAGNOSIS — E1136 Type 2 diabetes mellitus with diabetic cataract: Secondary | ICD-10-CM | POA: Insufficient documentation

## 2023-02-21 DIAGNOSIS — I129 Hypertensive chronic kidney disease with stage 1 through stage 4 chronic kidney disease, or unspecified chronic kidney disease: Secondary | ICD-10-CM | POA: Insufficient documentation

## 2023-02-21 HISTORY — DX: Chronic kidney disease, stage 3a: N18.31

## 2023-02-21 HISTORY — DX: Type 2 diabetes mellitus with diabetic chronic kidney disease: E11.22

## 2023-02-21 HISTORY — DX: Morbid (severe) obesity due to excess calories: E66.01

## 2023-02-21 HISTORY — PX: CATARACT EXTRACTION W/PHACO: SHX586

## 2023-02-21 LAB — GLUCOSE, CAPILLARY: Glucose-Capillary: 177 mg/dL — ABNORMAL HIGH (ref 70–99)

## 2023-02-21 SURGERY — PHACOEMULSIFICATION, CATARACT, WITH IOL INSERTION
Anesthesia: Monitor Anesthesia Care | Site: Eye | Laterality: Left

## 2023-02-21 MED ORDER — MOXIFLOXACIN HCL 0.5 % OP SOLN
OPHTHALMIC | Status: DC | PRN
  Administered 2023-02-21: .2 mL via OPHTHALMIC

## 2023-02-21 MED ORDER — TETRACAINE HCL 0.5 % OP SOLN
OPHTHALMIC | Status: AC
  Filled 2023-02-21: qty 4

## 2023-02-21 MED ORDER — BRIMONIDINE TARTRATE-TIMOLOL 0.2-0.5 % OP SOLN
OPHTHALMIC | Status: DC | PRN
  Administered 2023-02-21: 1 [drp] via OPHTHALMIC

## 2023-02-21 MED ORDER — ARMC OPHTHALMIC DILATING DROPS
OPHTHALMIC | Status: AC
  Filled 2023-02-21: qty 0.5

## 2023-02-21 MED ORDER — MIDAZOLAM HCL 2 MG/2ML IJ SOLN
INTRAMUSCULAR | Status: AC
  Filled 2023-02-21: qty 2

## 2023-02-21 MED ORDER — TETRACAINE 0.5 % OP SOLN OPTIME - NO CHARGE
OPHTHALMIC | Status: DC | PRN
  Administered 2023-02-21: 1 [drp] via OPHTHALMIC

## 2023-02-21 MED ORDER — FENTANYL CITRATE (PF) 100 MCG/2ML IJ SOLN
INTRAMUSCULAR | Status: AC
  Filled 2023-02-21: qty 2

## 2023-02-21 MED ORDER — MIDAZOLAM HCL 2 MG/2ML IJ SOLN
INTRAMUSCULAR | Status: DC | PRN
  Administered 2023-02-21: 2 mg via INTRAVENOUS

## 2023-02-21 MED ORDER — ARMC OPHTHALMIC DILATING DROPS
1.0000 | OPHTHALMIC | Status: DC | PRN
  Administered 2023-02-21 (×3): 1 via OPHTHALMIC

## 2023-02-21 MED ORDER — SIGHTPATH DOSE#1 BSS IO SOLN
INTRAOCULAR | Status: DC | PRN
  Administered 2023-02-21: 15 mL via INTRAOCULAR

## 2023-02-21 MED ORDER — SIGHTPATH DOSE#1 NA HYALUR & NA CHOND-NA HYALUR IO KIT
PACK | INTRAOCULAR | Status: DC | PRN
  Administered 2023-02-21: 1 via OPHTHALMIC

## 2023-02-21 MED ORDER — FENTANYL CITRATE (PF) 100 MCG/2ML IJ SOLN
INTRAMUSCULAR | Status: DC | PRN
  Administered 2023-02-21: 25 ug via INTRAVENOUS
  Administered 2023-02-21: 50 ug via INTRAVENOUS
  Administered 2023-02-21: 25 ug via INTRAVENOUS

## 2023-02-21 MED ORDER — LIDOCAINE HCL (PF) 2 % IJ SOLN
INTRAOCULAR | Status: DC | PRN
  Administered 2023-02-21: 4 mL via INTRAOCULAR

## 2023-02-21 MED ORDER — TETRACAINE HCL 0.5 % OP SOLN
1.0000 [drp] | OPHTHALMIC | Status: DC | PRN
  Administered 2023-02-21 (×3): 1 [drp] via OPHTHALMIC

## 2023-02-21 MED ORDER — SIGHTPATH DOSE#1 BSS IO SOLN
INTRAOCULAR | Status: DC | PRN
  Administered 2023-02-21: 89 mL via OPHTHALMIC

## 2023-02-21 SURGICAL SUPPLY — 10 items
CATARACT SUITE SIGHTPATH (MISCELLANEOUS) ×1
DISSECTOR HYDRO NUCLEUS 50X22 (MISCELLANEOUS) ×1 IMPLANT
DRSG TEGADERM 2-3/8X2-3/4 SM (GAUZE/BANDAGES/DRESSINGS) ×1 IMPLANT
FEE CATARACT SUITE SIGHTPATH (MISCELLANEOUS) ×1 IMPLANT
GLOVE SURG SYN 7.5 E (GLOVE) ×1
GLOVE SURG SYN 7.5 PF PI (GLOVE) ×1 IMPLANT
GLOVE SURG SYN 8.5 E (GLOVE) ×1
GLOVE SURG SYN 8.5 PF PI (GLOVE) ×1 IMPLANT
LENS CLAREON WGN WHEEL YLW 15 (Intraocular Lens) ×1 IMPLANT
LENS IOL CLRN WGN WHL 15.0 (Intraocular Lens) IMPLANT

## 2023-02-21 NOTE — Anesthesia Postprocedure Evaluation (Signed)
Anesthesia Post Note  Patient: Joanna Bishop  Procedure(s) Performed: CATARACT EXTRACTION PHACO AND INTRAOCULAR LENS PLACEMENT (IOC) LEFT DIABETIC 7.98 00:49.7 (Left: Eye)  Patient location during evaluation: PACU Anesthesia Type: MAC Level of consciousness: awake and alert Pain management: pain level controlled Vital Signs Assessment: post-procedure vital signs reviewed and stable Respiratory status: spontaneous breathing, nonlabored ventilation, respiratory function stable and patient connected to nasal cannula oxygen Cardiovascular status: stable and blood pressure returned to baseline Postop Assessment: no apparent nausea or vomiting Anesthetic complications: no   No notable events documented.   Last Vitals:  Vitals:   02/21/23 1057 02/21/23 1101  BP: (!) 158/75 (!) 140/98  Pulse: 93 90  Resp: 14 (!) 24  Temp: (!) 36.2 C (!) 36.2 C  SpO2: 96% 94%    Last Pain:  Vitals:   02/21/23 1101  TempSrc:   PainSc: 0-No pain                 [name] Steffany Schoenfelder

## 2023-02-21 NOTE — Transfer of Care (Signed)
Immediate Anesthesia Transfer of Care Note  Patient: Joanna Bishop  Procedure(s) Performed: CATARACT EXTRACTION PHACO AND INTRAOCULAR LENS PLACEMENT (IOC) LEFT DIABETIC 7.98 00:49.7 (Left: Eye)  Patient Location: PACU  Anesthesia Type: MAC  Level of Consciousness: awake, alert  and patient cooperative  Airway and Oxygen Therapy: Patient Spontanous Breathing and Patient connected to supplemental oxygen  Post-op Assessment: Post-op Vital signs reviewed, Patient's Cardiovascular Status Stable, Respiratory Function Stable, Patent Airway and No signs of Nausea or vomiting  Post-op Vital Signs: Reviewed and stable  Complications: No notable events documented.

## 2023-02-21 NOTE — H&P (Signed)
[facility]   Primary Care Physician:  Jacky Kindle, FNP Ophthalmologist: Dr. Deberah Pelton  Pre-Procedure History & Physical: HPI:  Joanna Bishop is a 67 y.o. female here for cataract surgery.   Past Medical History:  Diagnosis Date   Allergy 02/26/1958   Anxiety    Arthritis    shoulders, legs   Asthma    Blood transfusion without reported diagnosis 05/05/2021   Depression    Hyperlipidemia    Migraine headache    2x/month   Osteoporosis 11/27/2010   Type 2 diabetes mellitus with stage 3a chronic kidney disease (HCC)    Vertigo     Past Surgical History:  Procedure Laterality Date   ABDOMINAL HYSTERECTOMY     ARTHROPLASTY     CHOLECYSTECTOMY     FEMUR FRACTURE SURGERY     JOINT REPLACEMENT  12/16/2010   TOTAL KNEE ARTHROPLASTY Left    TUBAL LIGATION  10/08/1986    Prior to Admission medications   Medication Sig Start Date End Date Taking? Authorizing Provider  acetaminophen (TYLENOL) 650 MG CR tablet Take 650 mg by mouth every 8 (eight) hours as needed for pain.   Yes [provider]  albuterol (VENTOLIN HFA) 108 (90 Base) MCG/ACT inhaler INHALE 1-2 PUFFS BY MOUTH into THE lungs EVERY 4 TO 6 HOURS AS NEEDED FOR WHEEZING AND/OR SHORTNESS OF BREATH 08/01/22  Yes Drubel, Lillia Abed, PA-C  budesonide-formoterol (SYMBICORT) 80-4.5 MCG/ACT inhaler Inhale 2 puffs into the lungs 2 (two) times daily. 10/17/22  Yes Simmons-Robinson, Makiera, MD  estradiol (ESTRACE VAGINAL) 0.1 MG/GM vaginal cream Place 1 Applicatorful vaginally 3 (three) times a week. 05/28/22  Yes Bacigalupo, Marzella Schlein, MD  glipiZIDE (GLUCOTROL XL) 10 MG 24 hr tablet Take 1 tablet (10 mg total) by mouth every morning. 01/09/23  Yes Jacky Kindle, FNP  LANTUS SOLOSTAR 100 UNIT/ML Solostar Pen Inject 36 Units into the skin daily. 08/02/22  Yes Drubel, Lillia Abed, PA-C  losartan (COZAAR) 100 MG tablet Take 1 tablet (100 mg total) by mouth daily. 01/09/23  Yes Jacky Kindle, FNP  montelukast (SINGULAIR) 10  MG tablet Take 1 tablet (10 mg total) by mouth at bedtime. 01/09/23  Yes Jacky Kindle, FNP  Multiple Vitamin (MULTIVITAMIN WITH MINERALS) TABS tablet Take 1 tablet by mouth daily. Centrum Silver Women's 50+   Yes [provider]  NON FORMULARY Take 1 capsule by mouth in the morning and at bedtime. AZO Bladder Control Supplement   Yes [provider]  OneTouch Delica Lancets 33G MISC Use to check blood sugars once daily as directed. 02/07/21  Yes Bacigalupo, Marzella Schlein, MD  oxybutynin (DITROPAN-XL) 10 MG 24 hr tablet Take 1 tablet (10 mg total) by mouth at bedtime. 01/09/23  Yes Jacky Kindle, FNP  propranolol ER (INDERAL LA) 60 MG 24 hr capsule Take 1 capsule (60 mg total) by mouth daily. 01/09/23  Yes Jacky Kindle, FNP  rosuvastatin (CRESTOR) 5 MG tablet Take 1 tablet (5 mg total) by mouth daily. 01/09/23  Yes Jacky Kindle, FNP  blood glucose meter kit and supplies Dispense based on patient and insurance preference. Use daily as directed. 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 01/23/21   Erasmo Downer, MD  Continuous Glucose Sensor (FREESTYLE LIBRE 2 SENSOR) MISC Place 1 sensor on the skin every 14 days. Use to check glucose continuously 01/16/23   Jacky Kindle, FNP  Continuous Glucose Sensor (FREESTYLE LIBRE 3 SENSOR) MISC Place 1 sensor on the skin every 14 days. Use  to check glucose continuously 08/29/22   Drubel, Lillia Abed, PA-C  Dulaglutide (TRULICITY) 4.5 MG/0.5ML SOAJ Inject 4.5 mg into the skin once a week. 02/13/23   Jacky Kindle, FNP  glucose blood (ONETOUCH VERIO) test strip Use to check blood sugars once daily as instructed 02/07/21   Erasmo Downer, MD  Insulin Pen Needle (BD PEN NEEDLE NANO U/F) 32G X 4 MM MISC Use to inject insulin daily as directed 05/28/22   Drubel, Lillia Abed, PA-C  ipratropium (ATROVENT) 0.03 % nasal spray Place 2 sprays into both nostrils every 12 (twelve) hours. 12/07/22   Valinda Hoar, NP    Allergies as of 01/14/2023 - Review Complete  12/07/2022  Allergen Reaction Noted   Penicillins Swelling, Anaphylaxis, and Other (See Comments) 05/05/2021    Family History  Problem Relation Age of Onset   Diabetes Mother    COPD Mother    Diabetes Father    Coronary artery disease Father    Coronary artery disease Maternal Grandmother    Heart attack Maternal Grandmother    Diabetes Paternal Grandmother     Social History   Socioeconomic History   Marital status: Widowed    Spouse name: Not on file   Number of children: 3   Years of education: Not on file   Highest education level: Associate degree: occupational, Scientist, product/process development, or vocational program  Occupational History   Occupation: disability  Tobacco Use   Smoking status: Never   Smokeless tobacco: Never  Vaping Use   Vaping status: Never Used  Substance and Sexual Activity   Alcohol use: Not Currently    Comment: Maybe 6 beers a year   Drug use: Never   Sexual activity: Not Currently    Birth control/protection: Abstinence  Other Topics Concern   Not on file  Social History Narrative   Not on file   Social Drivers of Health   Financial Resource Strain: High Risk (02/11/2023)   Overall Financial Resource Strain (CARDIA)    Difficulty of Paying Living Expenses: Very hard  Food Insecurity: Food Insecurity Present (02/11/2023)   Hunger Vital Sign    Worried About Running Out of Food in the Last Year: Often true    Ran Out of Food in the Last Year: Sometimes true  Transportation Needs: Unmet Transportation Needs (02/11/2023)   PRAPARE - Transportation    Lack of Transportation (Medical): Yes    Lack of Transportation (Non-Medical): Yes  Physical Activity: Inactive (02/11/2023)   Exercise Vital Sign    Days of Exercise per Week: 0 days    Minutes of Exercise per Session: 20 min  Stress: Stress Concern Present (02/11/2023)   [employer] of Occupational Health - Occupational Stress Questionnaire    Feeling of Stress : Very much  Social Connections:  Moderately Isolated (02/11/2023)   Social Connection and Isolation Panel [NHANES]    Frequency of Communication with Friends and Family: More than three times a week    Frequency of Social Gatherings with Friends and Family: Once a week    Attends Religious Services: 1 to 4 times per year    Active Member of [employer] or Organizations: No    Attends Banker Meetings: Never    Marital Status: Widowed  Intimate Partner Violence: Not At Risk (03/12/2022)   Humiliation, Afraid, Rape, and Kick questionnaire    Fear of Current or Ex-Partner: No    Emotionally Abused: No    Physically Abused: No    Sexually Abused: No  Review of Systems: See HPI, otherwise negative ROS  Physical Exam: BP (!) 148/88   Pulse 94   Temp 97.9 F (36.6 C) (Temporal)   Resp 16   Ht 5\' 4"  (1.626 m)   Wt 112.5 kg   SpO2 96%   BMI 42.57 kg/m  General:   Alert, cooperative in NAD Head:  Normocephalic and atraumatic. Respiratory:  Normal work of breathing. Cardiovascular:  RRR  Impression/Plan: Joanna Bishop is here for cataract surgery.  Risks, benefits, limitations, and alternatives regarding cataract surgery have been reviewed with the patient.  Questions have been answered.  All parties agreeable.   Estanislado Pandy, MD  02/21/2023, 9:02 AM

## 2023-02-21 NOTE — Op Note (Signed)
OPERATIVE NOTE  MARGURETE HOEPNER [id number] 02/21/2023   PREOPERATIVE DIAGNOSIS: Nuclear sclerotic cataract left eye. H25.12   POSTOPERATIVE DIAGNOSIS: Nuclear sclerotic cataract left eye. H25.12   PROCEDURE:  Phacoemusification with posterior chamber intraocular lens placement of the left eye  Ultrasound time: Procedure(s): CATARACT EXTRACTION PHACO AND INTRAOCULAR LENS PLACEMENT (IOC) LEFT DIABETIC 7.98 00:49.7 (Left)  LENS:   Implant Name Type Inv. Item Serial No. Manufacturer Lot No. LRB No. Used Action  LENS CLAREON WGN WHEEL YLW 15 - [id number] Intraocular Lens LENS CLAREON WGN WHEEL YLW 15 [id number] [employer]  Left 1 Implanted      SURGEON:  Julious Payer. Rolley Sims, MD   ANESTHESIA:  Topical with tetracaine drops, augmented with 1% preservative-free intracameral lidocaine.   COMPLICATIONS:  None.   DESCRIPTION OF PROCEDURE:  The patient was identified in the holding room and transported to the operating room and placed in the supine position under the operating microscope.  The left eye was identified as the operative eye, which was prepped and draped in the usual sterile ophthalmic fashion.   A 1 millimeter clear-corneal paracentesis was made inferotemporally. Preservative-free 1% lidocaine mixed with 1:1,000 bisulfite-free aqueous solution of epinephrine was injected into the anterior chamber. The anterior chamber was then filled with Viscoat viscoelastic. A 2.4 millimeter keratome was used to make a clear-corneal incision superotemporally. A curvilinear capsulorrhexis was made with a cystotome and capsulorrhexis forceps. Balanced salt solution was used to hydrodissect and hydrodelineate the nucleus. Phacoemulsification was then used to remove the lens nucleus and epinucleus. The remaining cortex was then removed using the irrigation and aspiration handpiece. Provisc was then placed into the capsular bag to distend it for lens placement. A +15.00 D [id number] intraocular lens was then  injected into the capsular bag. The remaining viscoelastic was aspirated.   Wounds were hydrated with balanced salt solution.  The anterior chamber was inflated to a physiologic pressure with balanced salt solution.  No wound leaks were noted. Vigamox was injected intracamerally.  Timolol and Brimonidine drops were applied to the eye.  The patient was taken to the recovery room in stable condition without complications of anesthesia or surgery.  Rolly Pancake [place] 02/21/2023, 10:56 AM

## 2023-02-22 ENCOUNTER — Encounter: Payer: Self-pay | Admitting: Ophthalmology

## 2023-02-22 DIAGNOSIS — H2511 Age-related nuclear cataract, right eye: Secondary | ICD-10-CM | POA: Diagnosis not present

## 2023-02-22 NOTE — Anesthesia Preprocedure Evaluation (Addendum)
Anesthesia Evaluation    Airway Mallampati: III  TM Distance: >3 FB     Dental no notable dental hx.    Pulmonary           Cardiovascular      Neuro/Psych    GI/Hepatic   Endo/Other  diabetes    Renal/GU      Musculoskeletal   Abdominal   Peds  Hematology   Anesthesia Other Findings Previous cataract surgery 02-21-23 Dr. Juel Burrow and [place] B[place]um CRNA Had versed 2 mg  IV and Fentanyl 100 mcg IV  Depression Morbid obesity 40-44.9  Anxiety Asthma             Hyperlipidemia             Migraine headache Vertigo             Arthritis Allergy             Osteoporosis Blood transfusion without reported diagnosis           Type 2 diabetes mellitus with stage 3a chronic kidney disease      Reproductive/Obstetrics                             Anesthesia Physical Anesthesia Plan  ASA: 3  Anesthesia Plan: MAC   Post-op Pain Management:    Induction: Intravenous  PONV Risk Score and Plan:   Airway Management Planned: Natural Airway and Nasal Cannula  Additional Equipment:   Intra-op Plan:   Post-operative Plan:   Informed Consent: I have reviewed the patients History and Physical, chart, labs and discussed the procedure including the risks, benefits and alternatives for the proposed anesthesia with the patient or authorized representative who has indicated his/her understanding and acceptance.     Dental Advisory Given  Plan Discussed with: Anesthesiologist, CRNA and Surgeon  Anesthesia Plan Comments: (Patient consented for risks of anesthesia including but not limited to:  - adverse reactions to medications - damage to eyes, teeth, lips or other oral mucosa - nerve damage due to positioning  - sore throat or hoarseness - Damage to heart, brain, nerves, lungs, other parts of body or loss of life  Patient voiced understanding and assent.)       Anesthesia Quick  Evaluation

## 2023-03-01 ENCOUNTER — Telehealth: Payer: Self-pay | Admitting: Family Medicine

## 2023-03-01 NOTE — Telephone Encounter (Signed)
 Recieved a fax from covermymeds for Nurtec 75mg  dispersible tablets-To renew the PA   key: [id number]

## 2023-03-05 NOTE — Discharge Instructions (Signed)

## 2023-03-06 NOTE — Telephone Encounter (Signed)
 This was an Joanna Bishop patient and now she has [facility] listed as her PCP. This medication is also not on her active medication list.

## 2023-03-07 ENCOUNTER — Ambulatory Visit
Admission: RE | Admit: 2023-03-07 | Discharge: 2023-03-07 | Disposition: A | Discharge status: Home / Self Care | Payer: Medicare HMO | Source: Ambulatory Visit | Attending: Ophthalmology | Admitting: Ophthalmology

## 2023-03-07 ENCOUNTER — Encounter: Payer: Self-pay | Admitting: Ophthalmology

## 2023-03-07 ENCOUNTER — Encounter
Admission: RE | Disposition: A | Discharge status: Home / Self Care | Payer: Self-pay | Source: Ambulatory Visit | Attending: Ophthalmology

## 2023-03-07 ENCOUNTER — Ambulatory Visit: Payer: Medicare HMO | Admitting: Anesthesiology

## 2023-03-07 ENCOUNTER — Other Ambulatory Visit: Payer: Self-pay

## 2023-03-07 DIAGNOSIS — E1136 Type 2 diabetes mellitus with diabetic cataract: Secondary | ICD-10-CM | POA: Diagnosis not present

## 2023-03-07 DIAGNOSIS — I129 Hypertensive chronic kidney disease with stage 1 through stage 4 chronic kidney disease, or unspecified chronic kidney disease: Secondary | ICD-10-CM | POA: Insufficient documentation

## 2023-03-07 DIAGNOSIS — Z5982 Transportation insecurity: Secondary | ICD-10-CM | POA: Insufficient documentation

## 2023-03-07 DIAGNOSIS — J45909 Unspecified asthma, uncomplicated: Secondary | ICD-10-CM | POA: Insufficient documentation

## 2023-03-07 DIAGNOSIS — Z7984 Long term (current) use of oral hypoglycemic drugs: Secondary | ICD-10-CM | POA: Diagnosis not present

## 2023-03-07 DIAGNOSIS — Z7985 Long-term (current) use of injectable non-insulin antidiabetic drugs: Secondary | ICD-10-CM | POA: Insufficient documentation

## 2023-03-07 DIAGNOSIS — Z794 Long term (current) use of insulin: Secondary | ICD-10-CM | POA: Insufficient documentation

## 2023-03-07 DIAGNOSIS — N1831 Chronic kidney disease, stage 3a: Secondary | ICD-10-CM | POA: Diagnosis not present

## 2023-03-07 DIAGNOSIS — H2511 Age-related nuclear cataract, right eye: Secondary | ICD-10-CM | POA: Diagnosis not present

## 2023-03-07 DIAGNOSIS — E1122 Type 2 diabetes mellitus with diabetic chronic kidney disease: Secondary | ICD-10-CM | POA: Diagnosis not present

## 2023-03-07 DIAGNOSIS — Z5986 Financial insecurity: Secondary | ICD-10-CM | POA: Diagnosis not present

## 2023-03-07 DIAGNOSIS — D631 Anemia in chronic kidney disease: Secondary | ICD-10-CM | POA: Diagnosis not present

## 2023-03-07 HISTORY — PX: CATARACT EXTRACTION W/PHACO: SHX586

## 2023-03-07 LAB — GLUCOSE, CAPILLARY: Glucose-Capillary: 194 mg/dL — ABNORMAL HIGH (ref 70–99)

## 2023-03-07 SURGERY — PHACOEMULSIFICATION, CATARACT, WITH IOL INSERTION
Anesthesia: Monitor Anesthesia Care | Site: Eye | Laterality: Right

## 2023-03-07 MED ORDER — MIDAZOLAM HCL 2 MG/2ML IJ SOLN
INTRAMUSCULAR | Status: DC | PRN
  Administered 2023-03-07: 2 mg via INTRAVENOUS

## 2023-03-07 MED ORDER — SIGHTPATH DOSE#1 BSS IO SOLN
INTRAOCULAR | Status: DC | PRN
  Administered 2023-03-07: 86 mL via OPHTHALMIC

## 2023-03-07 MED ORDER — SIGHTPATH DOSE#1 BSS IO SOLN
INTRAOCULAR | Status: DC | PRN
  Administered 2023-03-07: 15 mL

## 2023-03-07 MED ORDER — MIDAZOLAM HCL 2 MG/2ML IJ SOLN
INTRAMUSCULAR | Status: AC
Start: 2023-03-07 — End: ?
  Filled 2023-03-07: qty 2

## 2023-03-07 MED ORDER — FENTANYL CITRATE (PF) 100 MCG/2ML IJ SOLN
INTRAMUSCULAR | Status: DC | PRN
  Administered 2023-03-07: 100 ug via INTRAVENOUS

## 2023-03-07 MED ORDER — FENTANYL CITRATE (PF) 100 MCG/2ML IJ SOLN
INTRAMUSCULAR | Status: AC
  Filled 2023-03-07: qty 2

## 2023-03-07 MED ORDER — MOXIFLOXACIN HCL 0.5 % OP SOLN
OPHTHALMIC | Status: DC | PRN
  Administered 2023-03-07: .2 mL via OPHTHALMIC

## 2023-03-07 MED ORDER — ARMC OPHTHALMIC DILATING DROPS
1.0000 | OPHTHALMIC | Status: DC | PRN
  Administered 2023-03-07 (×3): 1 via OPHTHALMIC

## 2023-03-07 MED ORDER — BRIMONIDINE TARTRATE-TIMOLOL 0.2-0.5 % OP SOLN
OPHTHALMIC | Status: DC | PRN
  Administered 2023-03-07: 1 [drp] via OPHTHALMIC

## 2023-03-07 MED ORDER — TETRACAINE HCL 0.5 % OP SOLN
1.0000 [drp] | OPHTHALMIC | Status: DC | PRN
  Administered 2023-03-07 (×3): 1 [drp] via OPHTHALMIC

## 2023-03-07 MED ORDER — LIDOCAINE HCL (PF) 2 % IJ SOLN
INTRAOCULAR | Status: DC | PRN
  Administered 2023-03-07: 1 mL via INTRAOCULAR

## 2023-03-07 MED ORDER — TETRACAINE HCL 0.5 % OP SOLN
OPHTHALMIC | Status: AC
  Filled 2023-03-07: qty 4

## 2023-03-07 MED ORDER — SIGHTPATH DOSE#1 NA HYALUR & NA CHOND-NA HYALUR IO KIT
PACK | INTRAOCULAR | Status: DC | PRN
  Administered 2023-03-07: 1 via OPHTHALMIC

## 2023-03-07 MED ORDER — ARMC OPHTHALMIC DILATING DROPS
OPHTHALMIC | Status: AC
  Filled 2023-03-07: qty 0.5

## 2023-03-07 SURGICAL SUPPLY — 21 items
BNDG EYE OVAL 2 1/8 X 2 5/8 (GAUZE/BANDAGES/DRESSINGS) IMPLANT
CANNULA ANT/CHMB 27G (MISCELLANEOUS) IMPLANT
CANNULA ANT/CHMB 27GA (MISCELLANEOUS)
CATARACT SUITE SIGHTPATH (MISCELLANEOUS) ×1
DISSECTOR HYDRO NUCLEUS 50X22 (MISCELLANEOUS) ×1 IMPLANT
DRSG TEGADERM 2-3/8X2-3/4 SM (GAUZE/BANDAGES/DRESSINGS) ×1 IMPLANT
FEE CATARACT SUITE SIGHTPATH (MISCELLANEOUS) ×1 IMPLANT
GLOVE SURG SYN 7.5 E (GLOVE) ×1
GLOVE SURG SYN 7.5 PF PI (GLOVE) ×1 IMPLANT
GLOVE SURG SYN 8.5 E (GLOVE) ×1
GLOVE SURG SYN 8.5 PF PI (GLOVE) ×1 IMPLANT
LENS CLARON WGN WHEEL YLW 14.5 (Intraocular Lens) ×1 IMPLANT
LENS IOL CLRN WGN WHL 14.5 (Intraocular Lens) IMPLANT
NDL FILTER BLUNT 18X1 1/2 (NEEDLE) IMPLANT
NEEDLE FILTER BLUNT 18X1 1/2 (NEEDLE)
PACK VIT ANT 23G (MISCELLANEOUS) IMPLANT
RING MALYGIN 7.0 (MISCELLANEOUS) IMPLANT
SUT ETHILON 10-0 CS-B-6CS-B-6 (SUTURE)
SUTURE EHLN 10-0 CS-B-6CS-B-6 (SUTURE) IMPLANT
SYR 3ML LL SCALE MARK (SYRINGE) IMPLANT
SYR 5ML LL (SYRINGE) IMPLANT

## 2023-03-07 NOTE — Op Note (Signed)
 OPERATIVE NOTE  Joanna Bishop [id number] 03/07/2023   PREOPERATIVE DIAGNOSIS: Nuclear sclerotic cataract right eye. H25.11   POSTOPERATIVE DIAGNOSIS: Nuclear sclerotic cataract right eye. H25.11   PROCEDURE:  Phacoemusification with posterior chamber intraocular lens placement of the right eye  Ultrasound time: Procedure(s) with comments: CATARACT EXTRACTION PHACO AND INTRAOCULAR LENS PLACEMENT (IOC) RIGHT DIABETIC (Right) - 5.73 0:39.9  LENS:   Implant Name Type Inv. Item Serial No. Manufacturer Lot No. LRB No. Used Action  LENS CLARON WGN WHEEL YLW 14.5 - [id number] Intraocular Lens LENS CLARON WGN WHEEL YLW 14.5 [id number] [employer]  Right 1 Implanted      SURGEON:  Feliciano HERO. [place], MD   ANESTHESIA:  Topical with tetracaine  drops, augmented with 1% preservative-free intracameral lidocaine .   COMPLICATIONS:  None.   DESCRIPTION OF PROCEDURE:  The patient was identified in the holding room and transported to the operating room and placed in the supine position under the operating microscope.  The right eye was identified as the operative eye, which was prepped and draped in the usual sterile ophthalmic fashion.   A 1 millimeter clear-corneal paracentesis was made superotemporally. Preservative-free 1% lidocaine  mixed with 1:1,000 bisulfite-free aqueous solution of epinephrine  was injected into the anterior chamber. The anterior chamber was then filled with Viscoat viscoelastic. A 2.4 millimeter keratome was used to make a clear-corneal incision inferotemporally. A curvilinear capsulorrhexis was made with a cystotome and capsulorrhexis forceps. Balanced salt  solution was used to hydrodissect and hydrodelineate the nucleus. Phacoemulsification was then used to remove the lens nucleus and epinucleus. The remaining cortex was then removed using the irrigation and aspiration handpiece. Provisc was then placed into the capsular bag to distend it for lens placement. A +14.50 D SY60WF  intraocular lens was then injected into the capsular bag. The remaining viscoelastic was aspirated.   Wounds were hydrated with balanced salt  solution.  The anterior chamber was inflated to a physiologic pressure with balanced salt  solution.  No wound leaks were noted. Vigamox  was injected intracamerally.  Timolol  and Brimonidine  drops were applied to the eye.  The patient was taken to the recovery room in stable condition without complications of anesthesia or surgery.  Feliciano Hugger [place] 03/07/2023, 7:56 AM

## 2023-03-07 NOTE — H&P (Signed)
 [facility]   Primary Care Physician:  [facility] Ophthalmologist: Dr. Feliciano Ober  Pre-Procedure History & Physical: HPI:  Joanna Bishop is a 68 y.o. female here for cataract surgery.   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)    Vertigo     Past Surgical History:  Procedure Laterality Date   ABDOMINAL HYSTERECTOMY     ARTHROPLASTY     CATARACT EXTRACTION W/PHACO Left 02/21/2023   Procedure: CATARACT EXTRACTION PHACO AND INTRAOCULAR LENS PLACEMENT (IOC) LEFT DIABETIC 7.98 00:49.7;  Surgeon: Ober Feliciano Hugger, MD;  Location: [facility];  Service: Ophthalmology;  Laterality: Left;   CHOLECYSTECTOMY     FEMUR FRACTURE SURGERY     JOINT REPLACEMENT  12/16/2010   TOTAL KNEE ARTHROPLASTY Left    TUBAL LIGATION  10/08/1986    Prior to Admission medications   Medication Sig Start Date End Date Taking? Authorizing Provider  acetaminophen  (TYLENOL ) 650 MG CR tablet Take 650 mg by mouth every 8 (eight) hours as needed for pain.   Yes [provider]  albuterol  (VENTOLIN  HFA) 108 (90 Base) MCG/ACT inhaler INHALE 1-2 PUFFS BY MOUTH into THE lungs EVERY 4 TO 6 HOURS AS NEEDED FOR WHEEZING AND/OR SHORTNESS OF BREATH 08/01/22  Yes Drubel, Manuelita, PA-C  budesonide -formoterol  (SYMBICORT ) 80-4.5 MCG/ACT inhaler Inhale 2 puffs into the lungs 2 (two) times daily. 10/17/22  Yes Simmons-Robinson, Makiera, MD  Dulaglutide  (TRULICITY ) 4.5 MG/0.5ML SOAJ Inject 4.5 mg into the skin once a week. 02/13/23  Yes Emilio Marseille T, FNP  estradiol  (ESTRACE  VAGINAL) 0.1 MG/GM vaginal cream Place 1 Applicatorful vaginally 3  (three) times a week. 05/28/22  Yes Bacigalupo, Jon HERO, MD  glipiZIDE  (GLUCOTROL  XL) 10 MG 24 hr tablet Take 1 tablet (10 mg total) by mouth every morning. 01/09/23  Yes Emilio Marseille T, FNP  ipratropium (ATROVENT ) 0.03 % nasal spray Place 2 sprays into both nostrils every 12 (twelve) hours. 12/07/22  Yes White, Shelba SAUNDERS, NP  LANTUS  SOLOSTAR 100 UNIT/ML Solostar Pen Inject 36 Units into the skin daily. 08/02/22  Yes Cyndi Manuelita, PA-C  losartan  (COZAAR ) 100 MG tablet Take 1 tablet (100 mg total) by mouth daily. 01/09/23  Yes Emilio Marseille T, FNP  montelukast  (SINGULAIR ) 10 MG tablet Take 1 tablet (10 mg total) by mouth at bedtime. 01/09/23  Yes Emilio Marseille DASEN, FNP  Multiple Vitamin (MULTIVITAMIN WITH MINERALS) TABS tablet Take 1 tablet by mouth daily. Centrum Silver Women's 50+   Yes [provider]  NON FORMULARY Take 1 capsule by mouth in the morning and at bedtime. AZO Bladder Control Supplement   Yes [provider]  oxybutynin  (DITROPAN -XL) 10 MG 24 hr tablet Take 1 tablet (10 mg total) by mouth at bedtime. 01/09/23  Yes Emilio Marseille DASEN, FNP  propranolol  ER (INDERAL  LA) 60 MG 24 hr capsule Take 1 capsule (60 mg total) by mouth daily. 01/09/23  Yes Emilio Marseille DASEN, FNP  rosuvastatin  (CRESTOR ) 5 MG tablet Take 1 tablet (5 mg total) by mouth daily. 01/09/23  Yes Emilio Marseille DASEN, FNP  blood glucose  meter kit and supplies Dispense based on patient and insurance preference. Use daily as directed. 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 01/23/21   Myrla Jon HERO, MD  Continuous Glucose Sensor (FREESTYLE LIBRE 2 SENSOR) MISC Place 1 sensor on the skin every 14 days. Use to check glucose continuously 01/16/23   Emilio Kelly DASEN, FNP  Continuous Glucose Sensor (FREESTYLE LIBRE 3 SENSOR) MISC Place 1 sensor on the skin every 14 days. Use to check glucose continuously 08/29/22   Drubel, Manuelita, PA-C  glucose blood (ONETOUCH VERIO) test strip Use to check blood sugars once daily as instructed 02/07/21    Bacigalupo, Angela M, MD  Insulin Pen Needle (BD PEN NEEDLE NANO U/F) 32G X 4 MM MISC Use to inject insulin daily as directed 05/28/22   Cyndi Manuelita, PA-C  OneTouch Delica Lancets 33G MISC Use to check blood sugars once daily as directed. 02/07/21   Myrla Jon HERO, MD    Allergies as of 01/28/2023 - Review Complete 12/07/2022  Allergen Reaction Noted   Penicillins Swelling, Anaphylaxis, and Other (See Comments) 05/05/2021    Family History  Problem Relation Age of Onset   Diabetes Mother    COPD Mother    Diabetes Father    Coronary artery disease Father    Coronary artery disease Maternal Grandmother    Heart attack Maternal Grandmother    Diabetes Paternal Grandmother     Social History   Socioeconomic History   Marital status: Widowed    Spouse name: Not on file   Number of children: 3   Years of education: Not on file   Highest education level: Associate degree: occupational, scientist, product/process development, or vocational program  Occupational History   Occupation: disability  Tobacco Use   Smoking status: Never   Smokeless tobacco: Never  Vaping Use   Vaping status: Never Used  Substance and Sexual Activity   Alcohol use: Not Currently    Comment: Maybe 6 beers a year   Drug use: Never   Sexual activity: Not Currently    Birth control/protection: Abstinence  Other Topics Concern   Not on file  Social History Narrative   Not on file   Social Drivers of Health   Financial Resource Strain: High Risk (02/11/2023)   Overall Financial Resource Strain (CARDIA)    Difficulty of Paying Living Expenses: Very hard  Food Insecurity: Food Insecurity Present (02/11/2023)   Hunger Vital Sign    Worried About Running Out of Food in the Last Year: Often true    Ran Out of Food in the Last Year: Sometimes true  Transportation Needs: Unmet Transportation Needs (02/11/2023)   PRAPARE - Transportation    Lack of Transportation (Medical): Yes    Lack of Transportation (Non-Medical): Yes   Physical Activity: Inactive (02/11/2023)   Exercise Vital Sign    Days of Exercise per Week: 0 days    Minutes of Exercise per Session: 20 min  Stress: Stress Concern Present (02/11/2023)   [employer] of Occupational Health - Occupational Stress Questionnaire    Feeling of Stress : Very much  Social Connections: Moderately Isolated (02/11/2023)   Social Connection and Isolation Panel [NHANES]    Frequency of Communication with Friends and Family: More than three times a week    Frequency of Social Gatherings with Friends and Family: Once a week    Attends Religious Services: 1 to 4 times per year    Active Member of [employer] or Organizations: No    Attends Banker Meetings: Never  Marital Status: Widowed  Intimate Partner Violence: Not At Risk (03/12/2022)   Humiliation, Afraid, Rape, and Kick questionnaire    Fear of Current or Ex-Partner: No    Emotionally Abused: No    Physically Abused: No    Sexually Abused: No    Review of Systems: See HPI, otherwise negative ROS  Physical Exam: There were no vitals taken for this visit. General:   Alert, cooperative in NAD Head:  Normocephalic and atraumatic. Respiratory:  Normal work of breathing. Cardiovascular:  RRR  Impression/Plan: Joanna Bishop is here for cataract surgery.  Risks, benefits, limitations, and alternatives regarding cataract surgery have been reviewed with the patient.  Questions have been answered.  All parties agreeable.   Feliciano Bryan Ober, MD  03/07/2023, 7:14 AM

## 2023-03-07 NOTE — Transfer of Care (Signed)
 Immediate Anesthesia Transfer of Care Note  Patient: Joanna Bishop  Procedure(s) Performed: CATARACT EXTRACTION PHACO AND INTRAOCULAR LENS PLACEMENT (IOC) RIGHT DIABETIC (Right: Eye)  Patient Location: PACU  Anesthesia Type: MAC  Level of Consciousness: awake, alert  and patient cooperative  Airway and Oxygen Therapy: Patient Spontanous Breathing and Patient connected to supplemental oxygen  Post-op Assessment: Post-op Vital signs reviewed, Patient's Cardiovascular Status Stable, Respiratory Function Stable, Patent Airway and No signs of Nausea or vomiting  Post-op Vital Signs: Reviewed and stable  Complications: No notable events documented.

## 2023-03-07 NOTE — Anesthesia Postprocedure Evaluation (Signed)
 Anesthesia Post Note  Patient: JASHAY RODDY  Procedure(s) Performed: CATARACT EXTRACTION PHACO AND INTRAOCULAR LENS PLACEMENT (IOC) RIGHT DIABETIC (Right: Eye)  Patient location during evaluation: PACU Anesthesia Type: MAC Level of consciousness: awake and alert Pain management: pain level controlled Vital Signs Assessment: post-procedure vital signs reviewed and stable Respiratory status: spontaneous breathing, nonlabored ventilation, respiratory function stable and patient connected to nasal cannula oxygen Cardiovascular status: blood pressure returned to baseline and stable Postop Assessment: no apparent nausea or vomiting Anesthetic complications: no   No notable events documented.   Last Vitals:  Vitals:   03/07/23 0757 03/07/23 0801  BP: 136/80 137/83  Pulse: 92 92  Resp: (!) 22 (!) 22  Temp: 36.5 C 36.5 C  SpO2: 93% 96%    Last Pain:  Vitals:   03/07/23 0801  TempSrc:   PainSc: 0-No pain                 Ladarren Steiner C Kathleene Bergemann

## 2023-03-08 ENCOUNTER — Encounter: Payer: Self-pay | Admitting: Ophthalmology

## 2023-03-12 ENCOUNTER — Ambulatory Visit (INDEPENDENT_AMBULATORY_CARE_PROVIDER_SITE_OTHER)

## 2023-03-12 ENCOUNTER — Ambulatory Visit
Admission: RE | Admit: 2023-03-12 | Discharge: 2023-03-12 | Disposition: A | Discharge status: Home / Self Care | Source: Ambulatory Visit | Attending: Physician Assistant | Admitting: Physician Assistant

## 2023-03-12 VITALS — BP 137/86 | HR 92 | Temp 98.6°F | Resp 16 | Ht 64.0 in | Wt 247.0 lb

## 2023-03-12 DIAGNOSIS — R0602 Shortness of breath: Secondary | ICD-10-CM | POA: Diagnosis not present

## 2023-03-12 DIAGNOSIS — J209 Acute bronchitis, unspecified: Secondary | ICD-10-CM | POA: Diagnosis not present

## 2023-03-12 DIAGNOSIS — R058 Other specified cough: Secondary | ICD-10-CM

## 2023-03-12 DIAGNOSIS — J45901 Unspecified asthma with (acute) exacerbation: Secondary | ICD-10-CM

## 2023-03-12 LAB — SARS CORONAVIRUS 2 BY RT PCR: SARS Coronavirus 2 by RT PCR: NEGATIVE

## 2023-03-12 MED ORDER — DOXYCYCLINE HYCLATE 100 MG PO CAPS: 100.0000 mg | ORAL_CAPSULE | Freq: Two times a day (BID) | ORAL | 0 refills | Status: AC

## 2023-03-12 MED ORDER — PREDNISONE 10 MG PO TABS
ORAL_TABLET | ORAL | 0 refills | Status: DC
Start: 2023-03-12 — End: 2023-03-27

## 2023-03-12 MED ORDER — GUAIFENESIN-CODEINE 100-10 MG/5ML PO SOLN: 10.0000 mL | Freq: Four times a day (QID) | ORAL | 0 refills | Status: DC | PRN

## 2023-03-12 NOTE — ED Triage Notes (Signed)
 Pt c/o cough,wheezing & sob x5 days. Hx of asthma.

## 2023-03-12 NOTE — ED Provider Notes (Signed)
 [facility]    CSN: [id number] Arrival date & time: 03/12/23  1250      History   Chief Complaint Chief Complaint  Patient presents with   Cough    Appt   Wheezing   Shortness of Breath    HPI Joanna Bishop is a 68 y.o. female with history of asthma, T2DM, HLD, bipolar disorder, migraines, obesity, allergies and osteoporosis.   Patient reports 5 day history of cough, congestion, wheezing and SOB. Reports fever up to 101 degrees that began yesterday. Denies sore throat, nasal congestion, ear pain, chest pain, abdominal pain, n/v/d. Has been taking OTC meds without relief. Has been using asthma inhalers more frequently. No other concerns.   HPI  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)    Vertigo     Patient Active Problem List   Diagnosis Date Noted   Moderate persistent asthma with exacerbation 10/18/2022   Respiratory symptoms 10/18/2022   Acute UTI (urinary tract infection) 07/17/2022   Microalbuminuria due to type 2 diabetes mellitus (HCC) 05/02/2022   Migraine with aura and without status migrainosus, not intractable 06/14/2021   Peptic ulcer disease with hemorrhage 05/12/2021   Anemia due to acute blood loss 05/12/2021   Hypertension associated with diabetes (HCC) 05/02/2021   Chronic 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 05/02/2021   OAB (overactive bladder) 01/12/2021   History of total knee arthroplasty 05/09/2020   Stiffness of right knee 06/18/2018   Type 2 diabetes mellitus with stage 3a chronic kidney disease, without long-term current use of insulin (HCC) 10/17/2015   Carpal tunnel  syndrome of left wrist 10/14/2015   Bipolar depression (HCC) 07/12/2015   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   Asthmatic bronchitis 08/02/2014   Hyperlipidemia associated with type 2 diabetes mellitus (HCC) 08/02/2014   History of suicidal ideation 08/02/2014   Avitaminosis D 08/02/2014   Generalized hyperhidrosis 08/02/2014   Airway hyperreactivity 08/02/2014   Anxiety disorder 08/02/2014   Insomnia 08/02/2014   Major depressive disorder, single episode 08/02/2014   Shortness of breath 08/05/2009    Past Surgical History:  Procedure Laterality Date   ABDOMINAL HYSTERECTOMY     ARTHROPLASTY     CATARACT EXTRACTION W/PHACO Left 02/21/2023   Procedure: CATARACT EXTRACTION PHACO AND INTRAOCULAR LENS PLACEMENT (IOC) LEFT DIABETIC 7.98 00:49.7;  Surgeon: Enola Feliciano Hugger, MD;  Location: [facility];  Service: Ophthalmology;  Laterality: Left;   CATARACT EXTRACTION W/PHACO Right 03/07/2023   Procedure: CATARACT EXTRACTION PHACO AND INTRAOCULAR LENS PLACEMENT (IOC) RIGHT DIABETIC;  Surgeon: Enola Feliciano Hugger, MD;  Location: [facility];  Service: Ophthalmology;  Laterality: Right;  5.73 0:39.9   CHOLECYSTECTOMY     FEMUR FRACTURE SURGERY     JOINT REPLACEMENT  12/16/2010   TOTAL KNEE ARTHROPLASTY Left    TUBAL LIGATION  10/08/1986    OB History   No obstetric history on file.      Home Medications    Prior to Admission medications   Medication Sig Start Date End Date Taking? Authorizing Provider  acetaminophen  (TYLENOL ) 650  MG CR tablet Take 650 mg by mouth every 8 (eight) hours as needed for pain.   Yes [provider]  albuterol  (VENTOLIN  HFA) 108 (90 Base) MCG/ACT inhaler INHALE 1-2 PUFFS BY MOUTH into THE lungs EVERY 4 TO 6 HOURS AS NEEDED FOR WHEEZING AND/OR SHORTNESS OF BREATH 08/01/22  Yes Drubel, Manuelita, PA-C  blood glucose meter kit and supplies Dispense based on patient and insurance preference. Use daily as directed. (FOR ICD-10  E11.65). 01/23/21  Yes Bacigalupo, Angela M, MD  budesonide -formoterol  (SYMBICORT ) 80-4.5 MCG/ACT inhaler Inhale 2 puffs into the lungs 2 (two) times daily. 10/17/22  Yes Simmons-Robinson, Makiera, MD  Continuous Glucose Sensor (FREESTYLE LIBRE 2 SENSOR) MISC Place 1 sensor on the skin every 14 days. Use to check glucose continuously 01/16/23  Yes Emilio Marseille T, FNP  Continuous Glucose Sensor (FREESTYLE LIBRE 3 SENSOR) MISC Place 1 sensor on the skin every 14 days. Use to check glucose continuously 08/29/22  Yes Drubel, Manuelita, PA-C  doxycycline  (VIBRAMYCIN ) 100 MG capsule Take 1 capsule (100 mg total) by mouth 2 (two) times daily for 7 days. 03/12/23 03/19/23 Yes Arvis Jolan NOVAK, PA-C  Dulaglutide  (TRULICITY ) 4.5 MG/0.5ML SOAJ Inject 4.5 mg into the skin once a week. 02/13/23  Yes Emilio Marseille T, FNP  estradiol  (ESTRACE  VAGINAL) 0.1 MG/GM vaginal cream Place 1 Applicatorful vaginally 3 (three) times a week. 05/28/22  Yes Bacigalupo, Jon HERO, MD  glipiZIDE  (GLUCOTROL  XL) 10 MG 24 hr tablet Take 1 tablet (10 mg total) by mouth every morning. 01/09/23  Yes Emilio Marseille T, FNP  glucose blood (ONETOUCH VERIO) test strip Use to check blood sugars once daily as instructed 02/07/21  Yes Bacigalupo, Angela M, MD  guaiFENesin -codeine  100-10 MG/5ML syrup Take 10 mLs by mouth every 6 (six) hours as needed for cough. 03/12/23  Yes Arvis Jolan B, PA-C  Insulin Pen Needle (BD PEN NEEDLE NANO U/F) 32G X 4 MM MISC Use to inject insulin daily as directed 05/28/22  Yes Drubel, Manuelita, PA-C  ipratropium (ATROVENT ) 0.03 % nasal spray Place 2 sprays into both nostrils every 12 (twelve) hours. 12/07/22  Yes White, Shelba SAUNDERS, NP  LANTUS  SOLOSTAR 100 UNIT/ML Solostar Pen Inject 36 Units into the skin daily. 08/02/22  Yes Cyndi Manuelita, PA-C  losartan  (COZAAR ) 100 MG tablet Take 1 tablet (100 mg total) by mouth daily. 01/09/23  Yes Emilio Marseille DASEN, FNP  montelukast  (SINGULAIR ) 10 MG tablet Take 1 tablet (10 mg total) by mouth at  bedtime. 01/09/23  Yes Emilio Marseille DASEN, FNP  Multiple Vitamin (MULTIVITAMIN WITH MINERALS) TABS tablet Take 1 tablet by mouth daily. Centrum Silver Women's 50+   Yes [provider]  NON FORMULARY Take 1 capsule by mouth in the morning and at bedtime. AZO Bladder Control Supplement   Yes [provider]  OneTouch Delica Lancets 33G MISC Use to check blood sugars once daily as directed. 02/07/21  Yes Bacigalupo, Angela M, MD  oxybutynin  (DITROPAN -XL) 10 MG 24 hr tablet Take 1 tablet (10 mg total) by mouth at bedtime. 01/09/23  Yes Emilio Marseille DASEN, FNP  predniSONE  (DELTASONE ) 10 MG tablet Take 6 tabs p.o. on day 1 and decrease by 1 tablet daily until complete 03/12/23  Yes Arvis Jolan NOVAK, PA-C  propranolol  ER (INDERAL  LA) 60 MG 24 hr capsule Take 1 capsule (60 mg total) by mouth daily. 01/09/23  Yes Emilio Marseille DASEN, FNP  rosuvastatin  (CRESTOR ) 5 MG tablet Take 1 tablet (5 mg total) by mouth daily. 01/09/23  Yes  Emilio Kelly DASEN, FNP    Family History Family History  Problem Relation Age of Onset   Diabetes Mother    COPD Mother    Diabetes Father    Coronary artery disease Father    Coronary artery disease Maternal Grandmother    Heart attack Maternal Grandmother    Diabetes Paternal Grandmother     Social History Social History   Tobacco Use   Smoking status: Never   Smokeless tobacco: Never  Vaping Use   Vaping status: Never Used  Substance Use Topics   Alcohol use: Not Currently    Comment: Maybe 6 beers a year   Drug use: Never     Allergies   Penicillins   Review of Systems Review of Systems  Constitutional:  Positive for fatigue and fever. Negative for chills and diaphoresis.  HENT:  Positive for congestion. Negative for ear pain, rhinorrhea, sinus pressure, sinus pain and sore throat.   Respiratory:  Positive for cough, shortness of breath and wheezing.   Cardiovascular:  Negative for chest pain.  Gastrointestinal:  Negative for abdominal pain, nausea  and vomiting.  Musculoskeletal:  Negative for arthralgias and myalgias.  Skin:  Negative for rash.  Neurological:  Negative for weakness and headaches.  Hematological:  Negative for adenopathy.     Physical Exam Triage Vital Signs ED Triage Vitals  Encounter Vitals Group     BP --      Systolic BP Percentile --      Diastolic BP Percentile --      Pulse --      Resp 03/12/23 1254 16     Temp --      Temp Source 03/12/23 1254 Oral     SpO2 --      Weight 03/12/23 1253 247 lb (112 kg)     Height 03/12/23 1253 5' 4 (1.626 m)     Head Circumference --      Peak Flow --      Pain Score --      Pain Loc --      Pain Education --      Exclude from Growth Chart --    No data found.  Updated Vital Signs BP 137/86 (BP Location: Left Arm)   Pulse 92   Temp 98.6 F (37 C) (Oral)   Resp 16   Ht 5' 4 (1.626 m)   Wt 247 lb (112 kg)   SpO2 94%   BMI 42.40 kg/m    Physical Exam Vitals and nursing note reviewed.  Constitutional:      General: She is not in acute distress.    Appearance: Normal appearance. She is well-developed. She is ill-appearing. She is not toxic-appearing.     Comments: Coughs frequently, deep bronchial cough  HENT:     Head: Normocephalic and atraumatic.     Nose: No congestion.     Mouth/Throat:     Mouth: Mucous membranes are moist.     Pharynx: Oropharynx is clear.  Eyes:     General: No scleral icterus.       Right eye: No discharge.        Left eye: No discharge.     Conjunctiva/sclera: Conjunctivae normal.  Cardiovascular:     Rate and Rhythm: Normal rate and regular rhythm.     Heart sounds: Normal heart sounds.  Pulmonary:     Effort: Pulmonary effort is normal. No respiratory distress.     Breath sounds: Rhonchi present. No wheezing.  Musculoskeletal:     Cervical back: Neck supple.  Skin:    General: Skin is dry.  Neurological:     General: No focal deficit present.     Mental Status: She is alert. Mental status is at baseline.      Motor: No weakness.     Gait: Gait normal.  Psychiatric:        Mood and Affect: Mood normal.        Behavior: Behavior normal.      UC Treatments / Results  Labs (all labs ordered are listed, but only abnormal results are displayed) Labs Reviewed  SARS CORONAVIRUS 2 BY RT PCR    EKG   Radiology DG Chest 2 View Result Date: 03/12/2023 CLINICAL DATA:  Cough, shortness of breath, and wheezing. EXAM: CHEST - 2 VIEW COMPARISON:  Chest radiograph dated 06/25/2019. FINDINGS: Minimal left lung base atelectasis. No focal consolidation, pleural effusion or pneumothorax. The cardiac silhouette is within normal limits. No acute osseous pathology. IMPRESSION: No active cardiopulmonary disease. Electronically Signed   By: Vanetta Chou M.D.   On: 03/12/2023 13:51    Procedures Procedures (including critical care time)  Medications Ordered in UC Medications - No data to display  Initial Impression / Assessment and Plan / UC Course  I have reviewed the triage vital signs and the nursing notes.  Pertinent labs & imaging results that were available during my care of the patient were reviewed by me and considered in my medical decision making (see chart for details).   68 y/o female with history of asthma, obesity, HLD, and T2DM presents for cough, congestion, wheezing and SOB x 5 days. Fever up to 101 degrees x 1 day.  Vitals are stable. She is ill appearing but non toxic. NAD. Normal HEENT. Scattered rhonchi bilateral upper lung fields. Heart RRR.  Ordered COVID test and CXR.  COVID-negative.  Chest x-ray negative.  Reviewed all results with patient.  She believes her symptoms sound consistent with bronchitis and I agree.  Also has an exacerbation.  She states she normally receives antibiotics, corticosteroid taper and codeine  containing cough medication and requests the same.  I did advise patient that most cases of bronchitis are viral and can last up to 6 weeks.  That in mind,  sent doxycycline  to pharmacy to cover any atypical bacteria.  Also sent prednisone  taper and Cheratussin after reviewing controlled substance database and finding her to be low risk for abuse.  Advised using inhalers and nebulizer at home.  Encouraged rest and fluids.  Reviewed return and ER precautions.  Acute illness.  Exacerbation of chronic underlying condition.   Final Clinical Impressions(s) / UC Diagnoses   Final diagnoses:  Shortness of breath  Acute bronchitis, unspecified organism  Exacerbation of asthma, unspecified asthma severity, unspecified whether persistent  Productive cough     Discharge Instructions      -No COVID.  Chest x-ray negative for pneumonia. - I sent antibiotics, corticosteroids and cough medicine pharmacy.  Use inhaler breathing treatments. - Return if not breaking fever in a couple days, worsening breathing.     ED Prescriptions     Medication Sig Dispense Auth. Provider   guaiFENesin -codeine  100-10 MG/5ML syrup Take 10 mLs by mouth every 6 (six) hours as needed for cough. 120 mL Arvis Huxley B, PA-C   predniSONE  (DELTASONE ) 10 MG tablet Take 6 tabs p.o. on day 1 and decrease by 1 tablet daily until complete 21 tablet Arvis Huxley NOVAK, PA-C   doxycycline  (  VIBRAMYCIN ) 100 MG capsule Take 1 capsule (100 mg total) by mouth 2 (two) times daily for 7 days. 14 capsule Arvis Jolan NOVAK, PA-C      I have reviewed the PDMP during this encounter.   Arvis Jolan NOVAK, PA-C 03/12/23 1414

## 2023-03-12 NOTE — Discharge Instructions (Signed)
-  No COVID.  Chest x-ray negative for pneumonia. - I sent antibiotics, corticosteroids and cough medicine pharmacy.  Use inhaler breathing treatments. - Return if not breaking fever in a couple days, worsening breathing.

## 2023-03-22 DIAGNOSIS — E785 Hyperlipidemia, unspecified: Secondary | ICD-10-CM | POA: Diagnosis not present

## 2023-03-22 DIAGNOSIS — Z7984 Long term (current) use of oral hypoglycemic drugs: Secondary | ICD-10-CM | POA: Diagnosis not present

## 2023-03-22 DIAGNOSIS — Z880 Allergy status to penicillin: Secondary | ICD-10-CM | POA: Diagnosis not present

## 2023-03-22 DIAGNOSIS — Z833 Family history of diabetes mellitus: Secondary | ICD-10-CM | POA: Diagnosis not present

## 2023-03-22 DIAGNOSIS — Z008 Encounter for other general examination: Secondary | ICD-10-CM | POA: Diagnosis not present

## 2023-03-22 DIAGNOSIS — G43909 Migraine, unspecified, not intractable, without status migrainosus: Secondary | ICD-10-CM | POA: Diagnosis not present

## 2023-03-22 DIAGNOSIS — I10 Essential (primary) hypertension: Secondary | ICD-10-CM | POA: Diagnosis not present

## 2023-03-22 DIAGNOSIS — Z7951 Long term (current) use of inhaled steroids: Secondary | ICD-10-CM | POA: Diagnosis not present

## 2023-03-22 DIAGNOSIS — E1165 Type 2 diabetes mellitus with hyperglycemia: Secondary | ICD-10-CM | POA: Diagnosis not present

## 2023-03-22 DIAGNOSIS — M1990 Unspecified osteoarthritis, unspecified site: Secondary | ICD-10-CM | POA: Diagnosis not present

## 2023-03-22 DIAGNOSIS — N3941 Urge incontinence: Secondary | ICD-10-CM | POA: Diagnosis not present

## 2023-03-22 DIAGNOSIS — F319 Bipolar disorder, unspecified: Secondary | ICD-10-CM | POA: Diagnosis not present

## 2023-03-27 ENCOUNTER — Other Ambulatory Visit: Payer: Self-pay | Admitting: Pharmacist

## 2023-03-27 DIAGNOSIS — N1831 Chronic kidney disease, stage 3a: Secondary | ICD-10-CM

## 2023-03-27 NOTE — Patient Instructions (Signed)
Goals Addressed             This Visit's Progress    Pharmacy Goals       Our goal A1c is less than 7%. This corresponds with fasting sugars less than 130 and 2 hour after meal sugars less than 180. Please keep a log of your results when checking your blood sugar   Our goal bad cholesterol, or LDL, is less than 70 . This is why it is important to continue taking your rosuvastatin.  Check your blood pressure twice weekly, and any time you have concerning symptoms like headache, chest pain, dizziness, shortness of breath, or vision changes.   Our goal is less than 130/80.  To appropriately check your blood pressure, make sure you do the following:  1) Avoid caffeine, exercise, or tobacco products for 30 minutes before checking. Empty your bladder. 2) Sit with your back supported in a flat-backed chair. Rest your arm on something flat (arm of the chair, table, etc). 3) Sit still with your feet flat on the floor, resting, for at least 5 minutes.  4) Check your blood pressure. Take 1-2 readings.  5) Write down these readings and bring with you to any provider appointments.  Bring your home blood pressure machine with you to a provider's office for accuracy comparison at least once a year.   Make sure you take your blood pressure medications before you come to any office visit, even if you were asked to fast for labs.   Estelle Grumbles, PharmD, [facility] [phone]

## 2023-03-27 NOTE — Progress Notes (Addendum)
03/27/2023 Name: Joanna Bishop MRN: [id number] DOB: 1955-04-30  Chief Complaint  Patient presents with   Medication Management    Joanna Bishop is a 68 y.o. year old female who presented for a telephone visit.   They were referred to the pharmacist by their PCP for assistance in managing diabetes.      Subjective:   Care Team: Primary Care Provider: Jacky Kindle, FNP    Medication Access/Adherence  Current Pharmacy:  [facility] [address]), [place] - [address] [place]) [place] [address] Phone: [phone] Fax: [phone]  [facility] - [place], [place] - [address] [facility] [address] [place] [address] Phone: [phone] Fax: [phone]   Patient reports affordability concerns with their medications: No  Patient reports access/transportation concerns to their pharmacy: No  Patient reports adherence concerns with their medications:  No     Note patient currently uses both [facility] mail order pharmacy and [facility]   Uses weekly pillbox for AM and PM medicine and Joanna Bishop App for daily reminders on her phone   Reports cataract surgery on both eyes in January and both went well  Note recently seen at [facility] in [facility] related to bronchitis. Provider started patient on doxycycline 100 mg twice daily for 7 days and 6-day prednisone taper course - reports feeling better since completed courses of antibiotic and steroid   Diabetes:   Current medications:  - Trulicity 4.5 mg weekly on Mondays - glipizide ER 10 mg daily every morning - Lantus 36 units daily   Medications tried in the past: metformin (GI side effects)   Reports has not used [employer] 2/3 continuous blood glucose monitor (CGM) for past couple of weeks, but planning to restart   Reports recent blood sugar readings: last checked last night, reading: 175 (after lasagna) - Completed prednisone  course last Thursday and noticing blood sugar coming down since    Patient denies hypoglycemic s/sx including dizziness, shakiness, sweating - Patient carries glucose tablets    Current physical activity: walking limited by knee pain   Statin: rosuvastatin 5 mg daily     Asthma:   Current medications: - Symbicort 80-4.5 mcg inhaler - 2 puffs twice daily - albuterol inhaler 1-2 puffs every 4-6 hours as needed for wheezing and/or shortness of breath - montelukast 10 mg QHS   Avoids triggers (going outside heat, overexertion, allergens in Spring and Fall)     Hypertension:   Current medications:  - losartan 100 mg daily - propranolol ER 60 mg daily (takes for migraine prevention)     Patient has a home upper arm home BP cuff Denies checking home blood pressure recently   Patient denies hypotensive s/sx including dizziness, lightheadedness as long as takes positional changes slowly.    Current physical activity: walking limited by knee pain   Objective:  Lab Results  Component Value Date   HGBA1C 6.8 (A) 10/18/2022    Lab Results  Component Value Date   CREATININE 1.17 (H) 09/06/2021   BUN 23 09/06/2021   NA 137 09/06/2021   K 4.5 09/06/2021   CL 103 09/06/2021   CO2 19 (L) 09/06/2021    Lab Results  Component Value Date   CHOL 129 09/06/2021   HDL 36 (L) 09/06/2021   LDLCALC 49 09/06/2021   TRIG 288 (H) 09/06/2021   CHOLHDL 3.6 09/06/2021   BP Readings from Last 3 Encounters:  03/12/23 137/86  03/07/23 137/83  02/21/23 (!) 140/98   Pulse Readings from Last 3 Encounters:  03/12/23 92  03/07/23 92  02/21/23 90    Medications Reviewed Today     Reviewed by Joanna Bishop, RPH-CPP (Pharmacist) on 03/27/23 at 1144  Med List Status: <None>   Medication Order Taking? Sig Documenting Provider Last Dose Status Informant  acetaminophen (TYLENOL) 650 MG CR tablet [id number]  Take 650 mg by mouth every 8 (eight) hours as needed for pain. [provider]  Active   albuterol (VENTOLIN HFA) 108 (90 Base) MCG/ACT inhaler [id number]  INHALE 1-2 PUFFS BY MOUTH into THE lungs EVERY 4 TO 6 HOURS AS NEEDED FOR WHEEZING AND/OR SHORTNESS OF BREATH Joanna Ferguson, PA-C  Active   blood glucose meter kit and supplies [id number]  Dispense based on patient and insurance preference. Use daily as directed. 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 Joanna Downer, MD  Active   budesonide-formoterol [facility]) 80-4.5 MCG/ACT inhaler [id number] Yes Inhale 2 puffs into the lungs 2 (two) times daily. Simmons-Robinson, Makiera, MD Taking Active   Continuous Glucose Sensor (FREESTYLE LIBRE 2 SENSOR) [place] [id number]  Place 1 sensor on the skin every 14 days. Use to check glucose continuously Joanna Kindle, FNP  Active   Continuous Glucose Sensor (FREESTYLE LIBRE 3 SENSOR) [place] [id number]  Place 1 sensor on the skin every 14 days. Use to check glucose continuously Drubel, Lillia Abed, PA-C  Active   Dulaglutide (TRULICITY) 4.5 MG/0.5ML [place]ory Broad [id number] Yes Inject 4.5 mg into the skin once a week. Joanna Kindle, FNP Taking Active   estradiol [facility] VAGINAL) 0.1 MG/GM vaginal cream [id number]  Place 1 Applicatorful vaginally 3 (three) times a week. Joanna Downer, MD  Active            Med Note Ronney Asters, [facility] A   Wed Sep 19, 2022  8:59 AM) Uses as needed  glipiZIDE (GLUCOTROL XL) 10 MG 24 hr tablet [id number] Yes Take 1 tablet (10 mg total) by mouth every morning. Joanna Kindle, FNP Taking Active   glucose blood [facility] VERIO) test strip [id number]  Use to check blood sugars once daily as instructed Joanna Downer, MD  Active   guaiFENesin-codeine 100-10 MG/5ML syrup [id number]  Take 10 mLs by mouth every 6 (six) hours as needed for cough. Shirlee Latch, PA-C  Active   Insulin Pen Needle (BD PEN NEEDLE NANO U/F) 32G X 4 MM MISC [id number]  Use to inject insulin daily as directed Drubel, Lillia Abed, PA-C  Active   ipratropium (ATROVENT) 0.03 % nasal spray [id number]  Place  2 sprays into both nostrils every 12 (twelve) hours. Valinda Hoar, NP  Active   LANTUS SOLOSTAR 100 UNIT/ML Solostar Pen [id number] Yes Inject 36 Units into the skin daily. Joanna Ferguson, PA-C Taking Active            Med Note Ronney Asters, Lillianna Sabel A   Wed Mar 27, 2023 11:37 AM)    losartan (COZAAR) 100 MG tablet [id number] Yes Take 1 tablet (100 mg total) by mouth daily. Joanna Kindle, FNP Taking Active   montelukast (SINGULAIR) 10 MG tablet [id number] Yes Take 1 tablet (10 mg total) by mouth at bedtime. Joanna Kindle, FNP Taking Active   Multiple Vitamin (MULTIVITAMIN WITH MINERALS) TABS tablet [id number]  Take 1 tablet by mouth daily. Centrum Silver Women's 50+ [provider]  Active   NON FORMULARY [id number]  Take 1 capsule by mouth in the morning and at bedtime. AZO  Bladder Control Supplement [provider]  Active   OneTouch Delica Lancets 33G MISC [id number]  Use to check blood sugars once daily as directed. Joanna Downer, MD  Active   oxybutynin (DITROPAN-XL) 10 MG 24 hr tablet [id number]  Take 1 tablet (10 mg total) by mouth at bedtime. Joanna Kindle, FNP  Active   propranolol ER (INDERAL LA) 60 MG 24 hr capsule [id number] Yes Take 1 capsule (60 mg total) by mouth daily. Joanna Kindle, FNP Taking Active   rosuvastatin (CRESTOR) 5 MG tablet [id number] Yes Take 1 tablet (5 mg total) by mouth daily. Joanna Kindle, FNP Taking Active               Assessment/Plan:   Note patient's previous PCP has left practice; advise patient to contact office today to schedule follow up appointment  Diabetes: - Have reviewed long term cardiovascular and renal outcomes of uncontrolled blood sugar - Have reviewed goal A1c, goal fasting, and goal 2 hour post prandial glucose - Reviewed dietary modifications including Encourage patient to have well-balanced meals and snacks spread throughout the day, while controlling carbohydrate portion sizes - Recommend to continue to  use Freestyle Libre CGM to monitor blood sugar/as feedback on dietary choices - Recommend to check glucose with fingerstick check when needed for symptoms and as back up to CGM. Patient to contact office if needed for readings outside of established parameters or symptoms     Hypertension: - Have encouraged patient to continue to take positional changes slowly - Recommend to monitor home blood pressure, keep log of results and have this record to review at upcoming medical appointments. Patient to contact provider office sooner if needed for readings outside of established parameters or symptoms     Asthma: - Patient to continue to take medications consistently and contact office if needed for increased albuterol use/symptoms     Follow Up Plan: Clinical Pharmacist will follow up with patient by telephone on 06/26/2023 at 11:30 AM      Estelle Grumbles, PharmD, [facility] [phone]

## 2023-04-03 ENCOUNTER — Ambulatory Visit: Payer: Medicare HMO

## 2023-04-03 VITALS — Ht 64.0 in | Wt 241.0 lb

## 2023-04-03 DIAGNOSIS — Z0000 Encounter for general adult medical examination without abnormal findings: Secondary | ICD-10-CM | POA: Diagnosis not present

## 2023-04-03 DIAGNOSIS — Z599 Problem related to housing and economic circumstances, unspecified: Secondary | ICD-10-CM

## 2023-04-03 DIAGNOSIS — E1122 Type 2 diabetes mellitus with diabetic chronic kidney disease: Secondary | ICD-10-CM

## 2023-04-03 DIAGNOSIS — Z1231 Encounter for screening mammogram for malignant neoplasm of breast: Secondary | ICD-10-CM

## 2023-04-03 NOTE — Progress Notes (Signed)
 Subjective:   Joanna Bishop is a 68 y.o. female who presents for Medicare Annual (Subsequent) preventive examination.  This patient declined Interactive audio and acupuncturist. Therefore the visit was completed with audio only.   Visit Complete: Virtual I connected with  Joanna Bishop on 04/03/23 by a audio enabled telemedicine application and verified that I am speaking with the correct person using two identifiers.  Patient Location: Home  Provider Location: Home Office  I discussed the limitations of evaluation and management by telemedicine. The patient expressed understanding and agreed to proceed.  Vital Signs: Because this visit was a virtual/telehealth visit, some criteria may be missing or patient reported. Any vitals not documented were not able to be obtained and vitals that have been documented are patient reported.  Patient Medicare AWV questionnaire was completed by the patient on 04/01/23; I have confirmed that all information answered by patient is correct and no changes since this date.  Cardiac Risk Factors include: advanced age (>27men, >25 women);diabetes mellitus;hypertension;dyslipidemia;obesity (BMI >30kg/m2)     Objective:    Today's Vitals   04/03/23 0855  Weight: 241 lb (109.3 kg)  Height: 5' 4 (1.626 m)   Body mass index is 41.37 kg/m.     04/03/2023    9:15 AM 03/07/2023    7:12 AM 02/21/2023    8:45 AM 12/07/2022    6:02 PM 07/17/2022   12:59 PM 03/12/2022    9:01 AM 03/09/2021    6:07 PM  Advanced Directives  Does Patient Have a Medical Advance Directive? Yes Yes Yes No No No;Yes Yes  Type of Estate Agent of [place];Living will Healthcare Power of [place];Living will Living will;Healthcare Power of [employer] Power of [place];Living will Healthcare Power of [place];Living will  Does patient want to make changes to medical advance directive? No - Patient declined No - Patient declined    Yes  (Inpatient - patient defers changing a medical advance directive at this time - Information given)   Copy of Healthcare Power of Attorney in Chart? No - copy requested No - copy requested No - copy requested   No - copy requested   Would patient like information on creating a medical advance directive?      No - Patient declined     Current Medications (verified) Outpatient Encounter Medications as of 04/03/2023  Medication Sig   acetaminophen  (TYLENOL ) 650 MG CR tablet Take 650 mg by mouth every 8 (eight) hours as needed for pain.   albuterol  (VENTOLIN  HFA) 108 (90 Base) MCG/ACT inhaler INHALE 1-2 PUFFS BY MOUTH into THE lungs EVERY 4 TO 6 HOURS AS NEEDED FOR WHEEZING AND/OR SHORTNESS OF BREATH   blood glucose meter kit and supplies Dispense based on patient and insurance preference. Use daily as directed. 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   budesonide -formoterol  (SYMBICORT ) 80-4.5 MCG/ACT inhaler Inhale 2 puffs into the lungs 2 (two) times daily.   Continuous Glucose Sensor (FREESTYLE LIBRE 2 SENSOR) MISC Place 1 sensor on the skin every 14 days. Use to check glucose continuously   Continuous Glucose Sensor (FREESTYLE LIBRE 3 SENSOR) MISC Place 1 sensor on the skin every 14 days. Use to check glucose continuously   diphenhydramine-acetaminophen  (TYLENOL  PM) 25-500 MG TABS tablet Take 1 tablet by mouth at bedtime.   Dulaglutide  (TRULICITY ) 4.5 MG/0.5ML SOAJ Inject 4.5 mg into the skin once a week.   estradiol  (ESTRACE  VAGINAL) 0.1 MG/GM vaginal cream Place 1 Applicatorful vaginally 3 (three) times a week.  glipiZIDE  (GLUCOTROL  XL) 10 MG 24 hr tablet Take 1 tablet (10 mg total) by mouth every morning.   glucose blood (ONETOUCH VERIO) test strip Use to check blood sugars once daily as instructed   guaiFENesin -codeine  100-10 MG/5ML syrup Take 10 mLs by mouth every 6 (six) hours as needed for cough.   Insulin Pen Needle (BD PEN NEEDLE NANO U/F) 32G X 4 MM MISC Use to inject insulin daily as directed    ipratropium (ATROVENT ) 0.03 % nasal spray Place 2 sprays into both nostrils every 12 (twelve) hours.   LANTUS  SOLOSTAR 100 UNIT/ML Solostar Pen Inject 36 Units into the skin daily.   losartan  (COZAAR ) 100 MG tablet Take 1 tablet (100 mg total) by mouth daily.   montelukast  (SINGULAIR ) 10 MG tablet Take 1 tablet (10 mg total) by mouth at bedtime.   Multiple Vitamin (MULTIVITAMIN WITH MINERALS) TABS tablet Take 1 tablet by mouth daily. Centrum Silver Women's 50+   NON FORMULARY Take 1 capsule by mouth in the morning and at bedtime. AZO Bladder Control Supplement   OneTouch Delica Lancets 33G MISC Use to check blood sugars once daily as directed.   oxybutynin  (DITROPAN -XL) 10 MG 24 hr tablet Take 1 tablet (10 mg total) by mouth at bedtime.   propranolol  ER (INDERAL  LA) 60 MG 24 hr capsule Take 1 capsule (60 mg total) by mouth daily.   rosuvastatin  (CRESTOR ) 5 MG tablet Take 1 tablet (5 mg total) by mouth daily.   No facility-administered encounter medications on file as of 04/03/2023.    Allergies (verified) Penicillins   History: 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)    Vertigo    Past Surgical History:  Procedure Laterality Date   ABDOMINAL HYSTERECTOMY     ARTHROPLASTY     CATARACT EXTRACTION W/PHACO Left 02/21/2023   Procedure: CATARACT EXTRACTION PHACO AND INTRAOCULAR LENS PLACEMENT (IOC) LEFT DIABETIC 7.98 00:49.7;  Surgeon: Enola Feliciano Hugger, MD;  Location: [facility];  Service: Ophthalmology;  Laterality: Left;   CATARACT EXTRACTION W/PHACO Right 03/07/2023   Procedure: CATARACT EXTRACTION PHACO AND INTRAOCULAR LENS  PLACEMENT (IOC) RIGHT DIABETIC;  Surgeon: Enola Feliciano Hugger, MD;  Location: [facility];  Service: Ophthalmology;  Laterality: Right;  5.73 0:39.9   CHOLECYSTECTOMY     FEMUR FRACTURE SURGERY     JOINT REPLACEMENT  12/16/2010   TOTAL KNEE ARTHROPLASTY Left    TUBAL LIGATION  10/08/1986   Family History  Problem Relation Age of Onset   Diabetes Mother    COPD Mother    Diabetes Father    Coronary artery disease Father    Coronary artery disease Maternal Grandmother    Heart attack Maternal Grandmother    Diabetes Paternal Grandmother    Social History   Socioeconomic History   Marital status: Widowed    Spouse name: Not on file   Number of children: 3   Years of education: Not on file   Highest education level: Associate degree: occupational, scientist, product/process development, or vocational program  Occupational History   Occupation: disability  Tobacco Use   Smoking status:  Never   Smokeless tobacco: Never  Vaping Use   Vaping status: Never Used  Substance and Sexual Activity   Alcohol use: Not Currently    Comment: Maybe 6 beers a year   Drug use: Never   Sexual activity: Not Currently    Birth control/protection: Abstinence  Other Topics Concern   Not on file  Social History Narrative   Not on file   Social Drivers of Health   Financial Resource Strain: High Risk (04/03/2023)   Overall Financial Resource Strain (CARDIA)    Difficulty of Paying Living Expenses: Very hard  Food Insecurity: Food Insecurity Present (04/03/2023)   Hunger Vital Sign    Worried About Running Out of Food in the Last Year: Never true    Ran Out of Food in the Last Year: Sometimes true  Transportation Needs: Unmet Transportation Needs (04/03/2023)   PRAPARE - Transportation    Lack of Transportation (Medical): Yes    Lack of Transportation (Non-Medical): Yes  Physical Activity: Inactive (04/03/2023)   Exercise Vital Sign    Days of Exercise per Week: 0 days    Minutes of Exercise per Session: 0 min   Stress: Stress Concern Present (04/03/2023)   [employer] of Occupational Health - Occupational Stress Questionnaire    Feeling of Stress : Very much  Social Connections: Moderately Isolated (04/03/2023)   Social Connection and Isolation Panel [NHANES]    Frequency of Communication with Friends and Family: More than three times a week    Frequency of Social Gatherings with Friends and Family: More than three times a week    Attends Religious Services: 1 to 4 times per year    Active Member of [employer] or Organizations: No    Attends Banker Meetings: Never    Marital Status: Widowed    Tobacco Counseling Counseling given: Not Answered   Clinical Intake:  Pre-visit preparation completed: Yes  Pain : No/denies pain     BMI - recorded: 41.37 Nutritional Status: BMI > 30  Obese Nutritional Risks: None Diabetes: Yes CBG done?: No Did pt. bring in CBG monitor from home?: No  How often do you need to have someone help you when you read instructions, pamphlets, or other written materials from your doctor or pharmacy?: 1 - Never  Interpreter Needed?: No  Information entered by :: Vina Ned, CMA   Activities of Daily Living    04/03/2023    9:00 AM 04/01/2023    1:57 PM  In your present state of health, do you have any difficulty performing the following activities:  Hearing? 0 0  Vision? 0 0  Difficulty concentrating or making decisions? 1 1  Walking or climbing stairs? 1 1  Comment uses a cane   Dressing or bathing? 0 0  Doing errands, shopping? 1 1  Comment mobility issues   Preparing Food and eating ? Y Y  Comment granddaughter does the cooking   Using the Toilet? N N  In the past six months, have you accidently leaked urine? Y Y  Comment depends   Do you have problems with loss of bowel control? N N  Managing your Medications? N N  Managing your Finances? N Y  Housekeeping or managing your Housekeeping? CINDERELLA CINDERELLA  Comment granddaughter helps      Patient Care Team: Wellington Curtis LABOR, FNP as PCP - General (Family Medicine) Sharlot Agent, OD (Optometry) Delles, Sharyle LABOR, RPH-CPP as Pharmacist Pa, [facility] (Optometry)  Indicate any recent Medical  Services you may have received from other than [facility] providers in the past year (date may be approximate).     Assessment:   This is a routine wellness examination for [place].  Hearing/Vision screen Hearing Screening - Comments:: Denies hearing loss Vision Screening - Comments:: Gets eye exams, [facility]    Goals Addressed               This Visit's Progress     Weight (lb) < 200 lb (90.7 kg) (pt-stated)   241 lb (109.3 kg)     Depression Screen    04/03/2023    9:09 AM 10/18/2022   10:42 AM 05/28/2022    2:19 PM 05/02/2022   10:43 AM 03/12/2022    9:00 AM 09/06/2021    9:04 AM 05/02/2021    1:28 PM  PHQ 2/9 Scores  PHQ - 2 Score 1 4 4 2  0 2 6  PHQ- 9 Score  12 18 6  0 7 25    Fall Risk    04/03/2023    9:17 AM 04/01/2023    1:57 PM 10/18/2022   10:42 AM 05/28/2022    2:19 PM 05/02/2022   10:43 AM  Fall Risk   Falls in the past year? 1 1 0 0 0  Number falls in past yr: 0 0  0 0  Injury with Fall? 0 0 0 0 0  Risk for fall due to : History of fall(s);Impaired balance/gait;Orthopedic patient;Impaired mobility  No Fall Risks No Fall Risks No Fall Risks  Follow up Falls prevention discussed;Falls evaluation completed;Education provided  Falls evaluation completed Falls evaluation completed Falls evaluation completed    MEDICARE RISK AT HOME: Medicare Risk at Home Any stairs in or around the home?: No If so, are there any without handrails?: No Home free of loose throw rugs in walkways, pet beds, electrical cords, etc?: Yes Adequate lighting in your home to reduce risk of falls?: Yes Life alert?: No Use of a cane, walker or w/c?: Yes Grab bars in the bathroom?: Yes Shower chair or bench in shower?: Yes Elevated toilet seat or a handicapped toilet?:  Yes  TIMED UP AND GO:  Was the test performed?  No    Cognitive Function:        04/03/2023    9:17 AM 03/12/2022    9:10 AM 07/07/2018   10:39 AM  6CIT Screen  What Year? 0 points 0 points 0 points  What month? 0 points 0 points 0 points  What time? 0 points 0 points 0 points  Count back from 20 0 points 0 points 0 points  Months in reverse 0 points 0 points 0 points  Repeat phrase 0 points 2 points 0 points  Total Score 0 points 2 points 0 points    Immunizations Immunization History  Administered Date(s) Administered   Influenza,inj,Quad PF,6+ Mos 01/06/2018, 12/12/2018, 05/02/2021, 05/02/2022   Pneumococcal Polysaccharide-23 07/30/2012   Tdap 07/28/2009    TDAP status: Due, Education has been provided regarding the importance of this vaccine. Advised may receive this vaccine at local pharmacy or Health Dept. Aware to provide a copy of the vaccination record if obtained from local pharmacy or Health Dept. Verbalized acceptance and understanding.  Flu Vaccine status: Due, Education has been provided regarding the importance of this vaccine. Advised may receive this vaccine at local pharmacy or Health Dept. Aware to provide a copy of the vaccination record if obtained from local pharmacy or Health Dept. Verbalized acceptance and understanding.  Pneumococcal vaccine status: Due, Education has been provided regarding the importance of this vaccine. Advised may receive this vaccine at local pharmacy or Health Dept. Aware to provide a copy of the vaccination record if obtained from local pharmacy or Health Dept. Verbalized acceptance and understanding.  Covid-19 vaccine status: Declined, Education has been provided regarding the importance of this vaccine but patient still declined. Advised may receive this vaccine at local pharmacy or Health Dept.or vaccine clinic. Aware to provide a copy of the vaccination record if obtained from local pharmacy or Health Dept. Verbalized acceptance  and understanding.  Qualifies for Shingles Vaccine? Yes   Zostavax completed No   Shingrix Completed?: No.    Education has been provided regarding the importance of this vaccine. Patient has been advised to call insurance company to determine out of pocket expense if they have not yet received this vaccine. Advised may also receive vaccine at local pharmacy or Health Dept. Verbalized acceptance and understanding.  Screening Tests Health Maintenance  Topic Date Due   HIV Screening  Never done   Fecal DNA (Cologuard)  Never done   Zoster Vaccines- Shingrix (1 of 2) Never done   Pneumonia Vaccine 76+ Years old (2 of 2 - PCV) 07/30/2013   FOOT EXAM  01/07/2019   DTaP/Tdap/Td (2 - Td or Tdap) 07/29/2019   MAMMOGRAM  02/22/2022   Diabetic kidney evaluation - eGFR measurement  09/07/2022   COVID-19 Vaccine (1 - 2024-25 season) Never done   INFLUENZA VACCINE  05/27/2023 (Originally 09/27/2022)   HEMOGLOBIN A1C  04/20/2023   Cervical Cancer Screening (HPV/Pap Cotest)  07/07/2023   Diabetic kidney evaluation - Urine ACR  10/18/2023   OPHTHALMOLOGY EXAM  01/08/2024   Medicare Annual Wellness (AWV)  04/02/2024   DEXA SCAN  Completed   Hepatitis C Screening  Completed   HPV VACCINES  Aged Out   Colonoscopy  Discontinued    Health Maintenance  Health Maintenance Due  Topic Date Due   HIV Screening  Never done   Fecal DNA (Cologuard)  Never done   Zoster Vaccines- Shingrix (1 of 2) Never done   Pneumonia Vaccine 13+ Years old (2 of 2 - PCV) 07/30/2013   FOOT EXAM  01/07/2019   DTaP/Tdap/Td (2 - Td or Tdap) 07/29/2019   MAMMOGRAM  02/22/2022   Diabetic kidney evaluation - eGFR measurement  09/07/2022   COVID-19 Vaccine (1 - 2024-25 season) Never done    Colorectal cancer screening: Type of screening: Cologuard. Completed just mailed in 02/2023 per patient. Repeat every 3 years  Mammogram status: Ordered 04/03/23. Pt provided with contact info and advised to call to schedule appt.   Bone  Density status: Completed 04/06/21. Results reflect: Bone density results: NORMAL. Repeat every 5 years.  Lung Cancer Screening: (Low Dose CT Chest recommended if Age 110-80 years, 20 pack-year currently smoking OR have quit w/in 15years.) does not qualify.   Lung Cancer Screening Referral: n/a  Additional Screening:  Hepatitis C Screening: does not qualify; Completed 10/14/15  Vision Screening: Recommended annual ophthalmology exams for early detection of glaucoma and other disorders of the eye. Is the patient up to date with their annual eye exam?  Yes  Who is the provider or what is the name of the office in which the patient attends annual eye exams? [facility] [place] If pt is not established with a provider, would they like to be referred to a provider to establish care? No .   Dental Screening: Recommended annual  dental exams for proper oral hygiene  Diabetic Foot Exam: Diabetic Foot Exam: Overdue, Pt has been advised about the importance in completing this exam. Pt is scheduled for diabetic foot exam on 04/10/23 at next OV.  Community Resource Referral / Chronic Care Management: CRR required this visit?  Yes   CCM required this visit?  No     Plan:     I have personally reviewed and noted the following in the patient's chart:   Medical and social history Use of alcohol, tobacco or illicit drugs  Current medications and supplements including opioid prescriptions. Patient is not currently taking opioid prescriptions. Functional ability and status Nutritional status Physical activity Advanced directives List of other physicians Hospitalizations, surgeries, and ER visits in previous 12 months Vitals Screenings to include cognitive, depression, and falls Referrals and appointments  In addition, I have reviewed and discussed with patient certain preventive protocols, quality metrics, and best practice recommendations. A written personalized care plan for preventive  services as well as general preventive health recommendations were provided to patient.     Vina Ned, CMA   04/03/2023   After Visit Summary: (MyChart) Due to this being a telephonic visit, the after visit summary with patients personalized plan was offered to patient via MyChart   Nurse Notes:  Needs Tdap, flu, pneumonia and shingles vaccines Needs DM foot exam at next OV on 04/10/23 Declined covid vaccine Placed referral for DM & Nutrition Education Placed order for [facility]  Placed community care referral for stress and financial instability Cologuard results pending. Patient states she mailed in the kit ~ 02/2023

## 2023-04-03 NOTE — Patient Instructions (Addendum)
 Ms. Bickle , Thank you for taking time to come for your Medicare Wellness Visit. I appreciate your ongoing commitment to your health goals. Please review the following plan we discussed and let me know if I can assist you in the future.   Referrals/Orders/Follow-Ups/Clinician Recommendations: Get the tetanus, flu, pneumonia and shingles vaccines at your convenience. You are due for a diabetic foot exam. This can be done at your next OV on 04/10/23. I have placed an order for a mammogram. Call [facility] @ [phone] to schedule at your earliest convenience. I have placed an order for diabetic & nutrition education. Someone should be calling you to schedule an appointment. I have placed a community care referral to see if they can help you with managing your stress and financial insecurity. Someone will call you in the next 30 days.  This is a list of the screening recommended for you and due dates:  Health Maintenance  Topic Date Due   HIV Screening  Never done   Cologuard (Stool DNA test)  Never done   Zoster (Shingles) Vaccine (1 of 2) Never done   Pneumonia Vaccine (2 of 2 - PCV) 07/30/2013   Complete foot exam   01/07/2019   DTaP/Tdap/Td vaccine (2 - Td or Tdap) 07/29/2019   Mammogram  02/22/2022   Yearly kidney function blood test for diabetes  09/07/2022   COVID-19 Vaccine (1 - 2024-25 season) Never done   Flu Shot  05/27/2023*   Hemoglobin A1C  04/20/2023   Pap with HPV screening  07/07/2023   Yearly kidney health urinalysis for diabetes  10/18/2023   Eye exam for diabetics  01/08/2024   Medicare Annual Wellness Visit  04/02/2024   DEXA scan (bone density measurement)  04/06/2026   Hepatitis C Screening  Completed   HPV Vaccine  Aged Out   Colon Cancer Screening  Discontinued  *Topic was postponed. The date shown is not the original due date.    Advanced directives: (Copy Requested) Please bring a copy of your health care power of attorney and living will to the office to  be added to your chart at your convenience.  Next Medicare Annual Wellness Visit scheduled for next year: Yes, 04/08/24 @ 8:50am (phone visit)  Fall Prevention in the Home, Adult Falls can cause injuries and affect people of all ages. There are many simple things that you can do to make your home safe and to help prevent falls. If you need it, ask for help making these changes. What actions can I take to prevent falls? General information Use good lighting in all rooms. Make sure to: Replace any light bulbs that burn out. Turn on lights if it is dark and use night-lights. Keep items that you use often in easy-to-reach places. Lower the shelves around your home if needed. Move furniture so that there are clear paths around it. Do not keep throw rugs or other things on the floor that can make you trip. If any of your floors are uneven, fix them. Add color or contrast paint or tape to clearly mark and help you see: Grab bars or handrails. First and last steps of staircases. Where the edge of each step is. If you use a ladder or stepladder: Make sure that it is fully opened. Do not climb a closed ladder. Make sure the sides of the ladder are locked in place. Have someone hold the ladder while you use it. Know where your pets are as you move through your  home. What can I do in the bathroom?     Keep the floor dry. Clean up any water that is on the floor right away. Remove soap buildup in the bathtub or shower. Buildup makes bathtubs and showers slippery. Use non-skid mats or decals on the floor of the bathtub or shower. Attach bath mats securely with double-sided, non-slip rug tape. If you need to sit down while you are in the shower, use a non-slip stool. Install grab bars by the toilet and in the bathtub and shower. Do not use towel bars as grab bars. What can I do in the bedroom? Make sure that you have a light by your bed that is easy to reach. Do not use any sheets or blankets on  your bed that hang to the floor. Have a firm bench or chair with side arms that you can use for support when you get dressed. What can I do in the kitchen? Clean up any spills right away. If you need to reach something above you, use a sturdy step stool that has a grab bar. Keep electrical cables out of the way. Do not use floor polish or wax that makes floors slippery. What can I do with my stairs? Do not leave anything on the stairs. Make sure that you have a light switch at the top and the bottom of the stairs. Have them installed if you do not have them. Make sure that there are handrails on both sides of the stairs. Fix handrails that are broken or loose. Make sure that handrails are as long as the staircases. Install non-slip stair treads on all stairs in your home if they do not have carpet. Avoid having throw rugs at the top or bottom of stairs, or secure the rugs with carpet tape to prevent them from moving. Choose a carpet design that does not hide the edge of steps on the stairs. Make sure that carpet is firmly attached to the stairs. Fix any carpet that is loose or worn. What can I do on the outside of my home? Use bright outdoor lighting. Repair the edges of walkways and driveways and fix any cracks. Clear paths of anything that can make you trip, such as tools or rocks. Add color or contrast paint or tape to clearly mark and help you see high doorway thresholds. Trim any bushes or trees on the main path into your home. Check that handrails are securely fastened and in good repair. Both sides of all steps should have handrails. Install guardrails along the edges of any raised decks or porches. Have leaves, snow, and ice cleared regularly. Use sand, salt, or ice melt on walkways during winter months if you live where there is ice and snow. In the garage, clean up any spills right away, including grease or oil spills. What other actions can I take? Review your medicines with your  health care provider. Some medicines can make you confused or feel dizzy. This can increase your chance of falling. Wear closed-toe shoes that fit well and support your feet. Wear shoes that have rubber soles and low heels. Use a cane, walker, scooter, or crutches that help you move around if needed. Talk with your provider about other ways that you can decrease your risk of falls. This may include seeing a physical therapist to learn to do exercises to improve movement and strength. Where to find more information Centers for Disease Control and Prevention, STEADI: tonerpromos.no [employer] on Aging: baseringtones.pl National Institute  on Aging: baseringtones.pl Contact a health care provider if: You are afraid of falling at home. You feel weak, drowsy, or dizzy at home. You fall at home. Get help right away if you: Lose consciousness or have trouble moving after a fall. Have a fall that causes a head injury. These symptoms may be an emergency. Get help right away. Call 911. Do not wait to see if the symptoms will go away. Do not drive yourself to the hospital. This information is not intended to replace advice given to you by your health care provider. Make sure you discuss any questions you have with your health care provider. Document Revised: 10/16/2021 Document Reviewed: 10/16/2021 Elsevier Patient Education  2024 [employer].

## 2023-04-04 ENCOUNTER — Telehealth: Payer: Self-pay | Admitting: *Deleted

## 2023-04-04 NOTE — Progress Notes (Signed)
 Complex Care Management Note Care Guide Note  04/04/2023 Name: Joanna Bishop MRN: [id number] DOB: 1955-06-11   Complex Care Management Outreach Attempts: An unsuccessful telephone outreach was attempted today to offer the patient information about available complex care management services.  Follow Up Plan:  Additional outreach attempts will be made to offer the patient complex care management information and services.   Encounter Outcome:  No Answer  Thedford Franks, CMA, Care Guide [facility]  [facility], [facility] Guide Direct Dial: [phone]  Fax: [phone] Website: [email or web address]

## 2023-04-05 NOTE — Progress Notes (Signed)
 Complex Care Management Note Care Guide Note  04/05/2023 Name: Joanna Bishop MRN: [id number] DOB: [birth year]   Complex Care Management Outreach Attempts: A second unsuccessful outreach was attempted today to offer the patient with information about available complex care management services.  Follow Up Plan:  Additional outreach attempts will be made to offer the patient complex care management information and services.   Encounter Outcome:  No Answer  Thedford Franks, CMA, Care Guide [facility]  [facility], [facility] Guide Direct Dial: [phone]  Fax: [phone] Website: [email or web address]

## 2023-04-08 NOTE — Progress Notes (Signed)
 Complex Care Management Note Care Guide Note  04/08/2023 Name: Joanna Bishop MRN: [id number] DOB: 12-Sep-1955   Complex Care Management Outreach Attempts: A third unsuccessful outreach was attempted today to offer the patient with information about available complex care management services.  Follow Up Plan:  No further outreach attempts will be made at this time. We have been unable to contact the patient to offer or enroll patient in complex care management services.  Encounter Outcome:  No Answer  Kandis Ormond, CMA, Care Guide [facility]  [facility], [facility] Guide Direct Dial: [phone]  Fax: [phone] Website: [email or web address]

## 2023-04-10 ENCOUNTER — Ambulatory Visit: Payer: Medicare HMO | Admitting: Family Medicine

## 2023-04-17 ENCOUNTER — Telehealth: Payer: Medicare HMO | Admitting: Family Medicine

## 2023-04-17 ENCOUNTER — Encounter: Payer: Self-pay | Admitting: Family Medicine

## 2023-04-17 DIAGNOSIS — I152 Hypertension secondary to endocrine disorders: Secondary | ICD-10-CM | POA: Diagnosis not present

## 2023-04-17 DIAGNOSIS — N1831 Chronic kidney disease, stage 3a: Secondary | ICD-10-CM | POA: Diagnosis not present

## 2023-04-17 DIAGNOSIS — Z7985 Long-term (current) use of injectable non-insulin antidiabetic drugs: Secondary | ICD-10-CM | POA: Diagnosis not present

## 2023-04-17 DIAGNOSIS — G43109 Migraine with aura, not intractable, without status migrainosus: Secondary | ICD-10-CM

## 2023-04-17 DIAGNOSIS — E1169 Type 2 diabetes mellitus with other specified complication: Secondary | ICD-10-CM

## 2023-04-17 DIAGNOSIS — J301 Allergic rhinitis due to pollen: Secondary | ICD-10-CM | POA: Insufficient documentation

## 2023-04-17 DIAGNOSIS — E1159 Type 2 diabetes mellitus with other circulatory complications: Secondary | ICD-10-CM

## 2023-04-17 DIAGNOSIS — Z794 Long term (current) use of insulin: Secondary | ICD-10-CM

## 2023-04-17 DIAGNOSIS — J4541 Moderate persistent asthma with (acute) exacerbation: Secondary | ICD-10-CM | POA: Diagnosis not present

## 2023-04-17 DIAGNOSIS — E1122 Type 2 diabetes mellitus with diabetic chronic kidney disease: Secondary | ICD-10-CM | POA: Diagnosis not present

## 2023-04-17 DIAGNOSIS — E785 Hyperlipidemia, unspecified: Secondary | ICD-10-CM

## 2023-04-17 DIAGNOSIS — N3281 Overactive bladder: Secondary | ICD-10-CM

## 2023-04-17 DIAGNOSIS — Z7984 Long term (current) use of oral hypoglycemic drugs: Secondary | ICD-10-CM

## 2023-04-17 MED ORDER — LANTUS SOLOSTAR 100 UNIT/ML ~~LOC~~ SOPN: 36.0000 [IU] | PEN_INJECTOR | Freq: Every day | SUBCUTANEOUS | 4 refills | Status: DC

## 2023-04-17 MED ORDER — PREDNISONE 20 MG PO TABS: 20.0000 mg | ORAL_TABLET | Freq: Two times a day (BID) | ORAL | 0 refills | Status: DC

## 2023-04-17 MED ORDER — ROSUVASTATIN CALCIUM 5 MG PO TABS: 5.0000 mg | ORAL_TABLET | Freq: Every day | ORAL | 1 refills | Status: DC

## 2023-04-17 MED ORDER — AZITHROMYCIN 250 MG PO TABS: ORAL_TABLET | ORAL | 0 refills | Status: AC

## 2023-04-17 MED ORDER — MONTELUKAST SODIUM 10 MG PO TABS
10.0000 mg | ORAL_TABLET | Freq: Every day | ORAL | 0 refills | Status: DC
Start: 2023-04-17 — End: 2023-07-17

## 2023-04-17 MED ORDER — LOSARTAN POTASSIUM 100 MG PO TABS: 100.0000 mg | ORAL_TABLET | Freq: Every day | ORAL | 1 refills | Status: DC

## 2023-04-17 MED ORDER — GLIPIZIDE ER 10 MG PO TB24: 10.0000 mg | ORAL_TABLET | Freq: Every morning | ORAL | 0 refills | Status: DC

## 2023-04-17 MED ORDER — OXYBUTYNIN CHLORIDE ER 10 MG PO TB24: 10.0000 mg | ORAL_TABLET | Freq: Every day | ORAL | 0 refills | Status: DC

## 2023-04-17 MED ORDER — PROPRANOLOL HCL ER 60 MG PO CP24: 60.0000 mg | ORAL_CAPSULE | Freq: Every day | ORAL | 0 refills | Status: DC

## 2023-04-17 NOTE — Progress Notes (Signed)
 Virtual Visit via Video Note  I connected with Joanna Bishop on 04/17/23 at  3:40 PM EST by a video enabled telemedicine application and verified that I am speaking with the correct person using two identifiers.  Patient Location: Home Provider Location: Home Office  I discussed the limitations, risks, security, and privacy concerns of performing an evaluation and management service by video and the availability of in person appointments. I also discussed with the patient that there may be a patient responsible charge related to this service. The patient expressed understanding and agreed to proceed.  Subjective: PCP: Sallee Provencal, FNP  No chief complaint on file.  Joanna NOBLE "Darl Pikes" is a 68 year old female with diabetes, hypertension, moderate persistent asthma, and overactive bladder who presents for follow-up nad medication refill, meet a new provider, and concerns for continuing brochitis.  She recently recovered from bronchitis but continues to experience congestion and cough without significant sputum production. She was treated with prednisone, an antibiotic, and cough syrup mid January. Typically, she requires two rounds of antibiotics and prednisone for bronchitis. She is allergic to penicillin and usually takes azithromycin.  Asthma - montelukast, She recently refilled her Symbicort inhaler and does not currently need albuterol.  DMII - She manages her diabetes with a Freestyle Libre 2 glucose monitor, Trulicity, Lantus Solostar, and glipizide 10 mg. She takes 36 units of Lantus nightly.  HTN - losartan 100 mg daily.  OAB - oxybutynin at night.  Migraines - propanolol 60mg  daily for prevention     ROS: Per HPI  Current Outpatient Medications:    azithromycin (ZITHROMAX) 250 MG tablet, Take 2 tablets on day 1, then 1 tablet daily on days 2 through 5, Disp: 6 tablet, Rfl: 0   predniSONE (DELTASONE) 20 MG tablet, Take 1 tablet (20 mg total) by mouth 2 (two)  times daily., Disp: 10 tablet, Rfl: 0   acetaminophen (TYLENOL) 650 MG CR tablet, Take 650 mg by mouth every 8 (eight) hours as needed for pain., Disp: , Rfl:    albuterol (VENTOLIN HFA) 108 (90 Base) MCG/ACT inhaler, INHALE 1-2 PUFFS BY MOUTH into THE lungs EVERY 4 TO 6 HOURS AS NEEDED FOR WHEEZING AND/OR SHORTNESS OF BREATH, Disp: 18 g, Rfl: 4   blood glucose meter kit and supplies, Dispense based on patient and insurance preference. Use daily as directed. (FOR ICD-10 E11.65)., Disp: 1 each, Rfl: 0   budesonide-formoterol (SYMBICORT) 80-4.5 MCG/ACT inhaler, Inhale 2 puffs into the lungs 2 (two) times daily., Disp: 3 each, Rfl: 3   Continuous Glucose Sensor (FREESTYLE LIBRE 2 SENSOR) MISC, Place 1 sensor on the skin every 14 days. Use to check glucose continuously, Disp: 2 each, Rfl: 12   Continuous Glucose Sensor (FREESTYLE LIBRE 3 SENSOR) MISC, Place 1 sensor on the skin every 14 days. Use to check glucose continuously, Disp: 2 each, Rfl: 11   diphenhydramine-acetaminophen (TYLENOL PM) 25-500 MG TABS tablet, Take 1 tablet by mouth at bedtime., Disp: , Rfl:    Dulaglutide (TRULICITY) 4.5 MG/0.5ML SOAJ, Inject 4.5 mg into the skin once a week., Disp: 6 mL, Rfl: 1   estradiol (ESTRACE VAGINAL) 0.1 MG/GM vaginal cream, Place 1 Applicatorful vaginally 3 (three) times a week., Disp: 42.5 g, Rfl: 12   glipiZIDE (GLUCOTROL XL) 10 MG 24 hr tablet, Take 1 tablet (10 mg total) by mouth every morning., Disp: 90 tablet, Rfl: 0   glucose blood (ONETOUCH VERIO) test strip, Use to check blood sugars once daily as instructed,  Disp: 100 each, Rfl: 12   guaiFENesin-codeine 100-10 MG/5ML syrup, Take 10 mLs by mouth every 6 (six) hours as needed for cough., Disp: 120 mL, Rfl: 0   insulin glargine (LANTUS SOLOSTAR) 100 UNIT/ML Solostar Pen, Inject 36 Units into the skin daily., Disp: 33 mL, Rfl: 4   Insulin Pen Needle (BD PEN NEEDLE NANO U/F) 32G X 4 MM MISC, Use to inject insulin daily as directed, Disp: 100 each, Rfl:  0   ipratropium (ATROVENT) 0.03 % nasal spray, Place 2 sprays into both nostrils every 12 (twelve) hours., Disp: 30 mL, Rfl: 12   losartan (COZAAR) 100 MG tablet, Take 1 tablet (100 mg total) by mouth daily., Disp: 90 tablet, Rfl: 1   montelukast (SINGULAIR) 10 MG tablet, Take 1 tablet (10 mg total) by mouth at bedtime., Disp: 90 tablet, Rfl: 0   Multiple Vitamin (MULTIVITAMIN WITH MINERALS) TABS tablet, Take 1 tablet by mouth daily. Centrum Silver Women's 50+, Disp: , Rfl:    NON FORMULARY, Take 1 capsule by mouth in the morning and at bedtime. AZO Bladder Control Supplement, Disp: , Rfl:    OneTouch Delica Lancets 33G MISC, Use to check blood sugars once daily as directed., Disp: 100 each, Rfl: 12   oxybutynin (DITROPAN-XL) 10 MG 24 hr tablet, Take 1 tablet (10 mg total) by mouth at bedtime., Disp: 90 tablet, Rfl: 0   propranolol ER (INDERAL LA) 60 MG 24 hr capsule, Take 1 capsule (60 mg total) by mouth daily., Disp: 90 capsule, Rfl: 0   rosuvastatin (CRESTOR) 5 MG tablet, Take 1 tablet (5 mg total) by mouth daily., Disp: 90 tablet, Rfl: 1  Observations/Objective: There were no vitals filed for this visit. Physical Exam Constitutional:      General: She is not in acute distress.    Appearance: Normal appearance. She is not ill-appearing, toxic-appearing or diaphoretic.  HENT:     Nose: Congestion present.  Eyes:     General:        Right eye: No discharge.        Left eye: No discharge.     Extraocular Movements: Extraocular movements intact.     Pupils: Pupils are equal, round, and reactive to light.  Cardiovascular:     Comments: Denies palpitations during visit Pulmonary:     Effort: Pulmonary effort is normal. No accessory muscle usage, prolonged expiration or respiratory distress.     Comments: Audible rhonchus coughing through virtual visit Musculoskeletal:     Cervical back: Normal range of motion.  Skin:    General: Skin is dry.  Neurological:     General: No focal  deficit present.     Mental Status: She is alert and oriented to person, place, and time. Mental status is at baseline.  Psychiatric:        Mood and Affect: Mood normal.        Behavior: Behavior normal.        Thought Content: Thought content normal.        Judgment: Judgment normal.     Assessment and Plan: Type 2 diabetes mellitus with stage 3a chronic kidney disease, without long-term current use of insulin (HCC) Assessment & Plan: Chronic, at goal previously Last A1C = 6.8 in August 2024 Continue Trulicity, glipizide and lantus Continue assistance with Pharm D for DMII control F/u in 4 weeks for A1C check and foot exam On statin and arb Continue to recommend balanced, lower carb meals. Smaller meal size, adding snacks. Choosing water as  drink of choice and increasing purposeful exercise.   Orders: -     glipiZIDE ER; Take 1 tablet (10 mg total) by mouth every morning.  Dispense: 90 tablet; Refill: 0 -     Lantus SoloStar; Inject 36 Units into the skin daily.  Dispense: 33 mL; Refill: 4  Hyperlipidemia associated with type 2 diabetes mellitus (HCC) Assessment & Plan: Chronic Rosuvastatin 5 mg daily Continue statin Recheck labs in 4 weeks at inperson f/u  Orders: -     Rosuvastatin Calcium; Take 1 tablet (5 mg total) by mouth daily.  Dispense: 90 tablet; Refill: 1  Hypertension associated with diabetes (HCC) Assessment & Plan: Chronic, unable to assess during in virtual Continue losartan 100 mg daily F.u 4 weeks for in person check and labs  Orders: -     Losartan Potassium; Take 1 tablet (100 mg total) by mouth daily.  Dispense: 90 tablet; Refill: 1  Moderate persistent asthma with exacerbation Assessment & Plan: Subacute Treated for bronchitis in mid January 2025 Given continued symptoms recommend abx and steroids  continued use of daily supportive med inhalers and montelukast F/u as needed    Orders: -     Azithromycin; Take 2 tablets on day 1, then 1  tablet daily on days 2 through 5  Dispense: 6 tablet; Refill: 0 -     predniSONE; Take 1 tablet (20 mg total) by mouth 2 (two) times daily.  Dispense: 10 tablet; Refill: 0  Seasonal allergic rhinitis due to pollen Assessment & Plan: Continue nightly montelukast   Orders: -     Montelukast Sodium; Take 1 tablet (10 mg total) by mouth at bedtime.  Dispense: 90 tablet; Refill: 0  Migraine with aura and without status migrainosus, not intractable Assessment & Plan: Continue propanolol 60mg  daily for prevention Continue to monitor HR at home with apple watch  Orders: -     Propranolol HCl ER; Take 1 capsule (60 mg total) by mouth daily.  Dispense: 90 capsule; Refill: 0  OAB (overactive bladder) Assessment & Plan: Chronic Stable on oxybutynin 10mg  nightly   Orders: -     oxyBUTYnin Chloride ER; Take 1 tablet (10 mg total) by mouth at bedtime.  Dispense: 90 tablet; Refill: 0    Follow Up Instructions: Return in about 4 weeks (around 05/15/2023) for chronic disease mgmt - HTN and DMII.   I discussed the assessment and treatment plan with the patient. The patient was provided an opportunity to ask questions, and all were answered. The patient agreed with the plan and demonstrated an understanding of the instructions.   The patient was advised to call back or seek an in-person evaluation if the symptoms worsen or if the condition fails to improve as anticipated.  The above assessment and management plan was discussed with the patient. The patient verbalized understanding of and has agreed to the management plan.   Sallee Provencal, FNP

## 2023-04-17 NOTE — Assessment & Plan Note (Signed)
 Chronic, unable to assess during in virtual Continue losartan 100 mg daily F.u 4 weeks for in person check and labs

## 2023-04-17 NOTE — Assessment & Plan Note (Signed)
 Chronic Stable on oxybutynin 10mg  nightly

## 2023-04-17 NOTE — Assessment & Plan Note (Signed)
 Continue propanolol 60mg  daily for prevention Continue to monitor HR at home with apple watch

## 2023-04-17 NOTE — Assessment & Plan Note (Signed)
 Subacute Treated for bronchitis in mid January 2025 Given continued symptoms recommend abx and steroids  continued use of daily supportive med inhalers and montelukast F/u as needed

## 2023-04-17 NOTE — Assessment & Plan Note (Addendum)
 Chronic, at goal previously Last A1C = 6.8 in August 2024 Continue Trulicity, glipizide and lantus Continue assistance with Pharm D for DMII control F/u in 4 weeks for A1C check and foot exam On statin and arb Continue to recommend balanced, lower carb meals. Smaller meal size, adding snacks. Choosing water as drink of choice and increasing purposeful exercise.

## 2023-04-17 NOTE — Assessment & Plan Note (Signed)
 Chronic Rosuvastatin 5 mg daily Continue statin Recheck labs in 4 weeks at inperson f/u

## 2023-04-17 NOTE — Assessment & Plan Note (Addendum)
 Continue nightly montelukast

## 2023-04-18 ENCOUNTER — Ambulatory Visit: Payer: Medicare HMO | Admitting: Family Medicine

## 2023-04-18 ENCOUNTER — Encounter: Payer: Self-pay | Admitting: Family Medicine

## 2023-05-08 ENCOUNTER — Ambulatory Visit: Payer: Medicare HMO | Admitting: Family Medicine

## 2023-05-08 ENCOUNTER — Encounter: Payer: Self-pay | Admitting: Family Medicine

## 2023-05-08 VITALS — BP 112/80 | HR 102 | Ht 64.0 in | Wt 248.8 lb

## 2023-05-08 DIAGNOSIS — I152 Hypertension secondary to endocrine disorders: Secondary | ICD-10-CM | POA: Diagnosis not present

## 2023-05-08 DIAGNOSIS — F319 Bipolar disorder, unspecified: Secondary | ICD-10-CM | POA: Diagnosis not present

## 2023-05-08 DIAGNOSIS — E1159 Type 2 diabetes mellitus with other circulatory complications: Secondary | ICD-10-CM

## 2023-05-08 DIAGNOSIS — E1169 Type 2 diabetes mellitus with other specified complication: Secondary | ICD-10-CM | POA: Diagnosis not present

## 2023-05-08 DIAGNOSIS — J4541 Moderate persistent asthma with (acute) exacerbation: Secondary | ICD-10-CM

## 2023-05-08 DIAGNOSIS — Z1211 Encounter for screening for malignant neoplasm of colon: Secondary | ICD-10-CM

## 2023-05-08 DIAGNOSIS — E1122 Type 2 diabetes mellitus with diabetic chronic kidney disease: Secondary | ICD-10-CM | POA: Diagnosis not present

## 2023-05-08 DIAGNOSIS — N3281 Overactive bladder: Secondary | ICD-10-CM | POA: Diagnosis not present

## 2023-05-08 DIAGNOSIS — N1831 Chronic kidney disease, stage 3a: Secondary | ICD-10-CM | POA: Diagnosis not present

## 2023-05-08 DIAGNOSIS — E785 Hyperlipidemia, unspecified: Secondary | ICD-10-CM | POA: Diagnosis not present

## 2023-05-08 DIAGNOSIS — Z59819 Housing instability, housed unspecified: Secondary | ICD-10-CM | POA: Diagnosis not present

## 2023-05-08 NOTE — Assessment & Plan Note (Signed)
 Chronic, controlled Continue Losartan 100mg 

## 2023-05-08 NOTE — Assessment & Plan Note (Signed)
 Chronic Rosuvastatin 5 mg daily Continue statin Check lipid panel today

## 2023-05-08 NOTE — Progress Notes (Signed)
 Established Patient Office Visit  Introduced to nurse practitioner role and practice setting.  All questions answered.  Discussed provider/patient relationship and expectations.   Subjective   Patient ID: Joanna Bishop, female    DOB: 1956-02-05  Age: 68 y.o. MRN: [id number]  Chief Complaint  Patient presents with   Follow-up    Follow up for BP and DMII check    The patient presents for management of blood pressure and diabetes.  She has diabetes and uses a glucose monitor, currently the [place] 3. She takes glipizide 10 mg, Trulicity 4.5 mg weekly, and insulin 36 mg daily. Blood sugars vary from 110 to 280 mg/dL, with higher readings postprandially. No hypoglycemic episodes are noted. Prednisone use elevates her blood sugar significantly - which she was recently on for asthma.  Her blood pressure is well-controlled at 112/80 mmHg. She is taking losartan 100mg  daily.   Takes propanolol 60mg  daily for migraine prevention - denies concerns for migraines.  Asthma - Albuterol as needed and Symbicort regularly.  Singulair at night for asthma management. No acute issues reported with asthma management.  She is experiencing housing issues and is trying to resolve them. She is raising two granddaughters, one of whom is graduating high school this year. She has three children and six grandchildren in total. Her husband passed away from ALS, and her granddaughters have been a significant support for her.        05/08/2023   10:26 AM 04/03/2023    9:09 AM 10/18/2022   10:42 AM  Depression screen PHQ 2/9  Decreased Interest 3 0 2  Down, Depressed, Hopeless 3 1 2   PHQ - 2 Score 6 1 4   Altered sleeping 3  2  Tired, decreased energy 3  2  Change in appetite 3  0  Feeling bad or failure about yourself  3  2  Trouble concentrating 3  2  Moving slowly or fidgety/restless 3  0  Suicidal thoughts 2  0  PHQ-9 Score 26  12  Difficult doing work/chores Very difficult         05/08/2023   10:26  AM 10/18/2022   10:43 AM  GAD 7 : Generalized Anxiety Score  Nervous, Anxious, on Edge 3 2  Control/stop worrying 3 2  Worry too much - different things 3 2  Trouble relaxing 3 2  Restless 3 2  Easily annoyed or irritable 3 2  Afraid - awful might happen 3 2  Total GAD 7 Score 21 14  Anxiety Difficulty Very difficult      Review of Systems  All other systems reviewed and are negative.   Negative unless indicated in HPI   Objective:     BP 112/80   Pulse (!) 102   Ht 5\' 4"  (1.626 m)   Wt 248 lb 12.8 oz (112.9 kg)   SpO2 98%   BMI 42.71 kg/m    Physical Exam Constitutional:      General: She is not in acute distress.    Appearance: Normal appearance. She is obese. She is not toxic-appearing or diaphoretic.  HENT:     Head: Normocephalic.     Nose: Nose normal.     Mouth/Throat:     Mouth: Mucous membranes are moist.     Pharynx: Oropharynx is clear.  Eyes:     Extraocular Movements: Extraocular movements intact.     Pupils: Pupils are equal, round, and reactive to light.  Neck:     Vascular: No  carotid bruit.  Cardiovascular:     Rate and Rhythm: Normal rate and regular rhythm.     Pulses: Normal pulses.     Heart sounds: Normal heart sounds. No murmur heard.    No friction rub. No gallop.  Pulmonary:     Effort: No respiratory distress.     Breath sounds: No stridor. No wheezing, rhonchi or rales.  Chest:     Chest wall: No tenderness.  Abdominal:     Tenderness: There is no right CVA tenderness or left CVA tenderness.  Musculoskeletal:     Right lower leg: No edema.     Left lower leg: No edema.  Lymphadenopathy:     Cervical: No cervical adenopathy.  Skin:    General: Skin is warm and dry.     Capillary Refill: Capillary refill takes less than 2 seconds.  Neurological:     General: No focal deficit present.     Mental Status: She is alert and oriented to person, place, and time. Mental status is at baseline.     Cranial Nerves: No cranial nerve  deficit.     Sensory: No sensory deficit.     Motor: No weakness.     Coordination: Coordination normal.  Psychiatric:        Mood and Affect: Mood normal.        Behavior: Behavior normal.        Thought Content: Thought content normal.        Judgment: Judgment normal.      No results found for any visits on 05/08/23.    The ASCVD Risk score (Arnett DK, et al., 2019) failed to calculate for the following reasons:   The valid total cholesterol range is 130 to 320 mg/dL    Assessment & Plan:  Hypertension associated with diabetes (HCC) Assessment & Plan: Chronic, controlled Continue Losartan 100mg   Orders: -     Comprehensive metabolic panel  Hyperlipidemia associated with type 2 diabetes mellitus (HCC) Assessment & Plan: Chronic Rosuvastatin 5 mg daily Continue statin Check lipid panel today    Orders: -     Lipid panel  Type 2 diabetes mellitus with stage 3a chronic kidney disease, without long-term current use of insulin (HCC) Assessment & Plan: Chronic, at goal previously Last A1C = 6.8 in August 2024 Continue Trulicity, glipizide and lantus Continue assistance with Pharm D for DMII control On statin and arb Continue to recommend balanced, lower carb meals. Smaller meal size, adding snacks. Choosing water as drink of choice and increasing purposeful exercise. Check A1C and urine ACR today F/u 4 months    Orders: -     Microalbumin / creatinine urine ratio -     Hemoglobin A1c -     Lipid panel  Housing insecurity Assessment & Plan: States has some housing issues due to financial concerns - did not divulge further Referral to [facility]   Orders: -     AMB Referral [facility] Care Management  Moderate persistent asthma with exacerbation Assessment & Plan: Chronic, controlled Continue PRN albuterol, daily Symbicort, and nightly singular  Orders: -     CBC  Screening for colon cancer -     Cologuard  OAB (overactive bladder) Assessment &  Plan: Chronic Stable on oxybutynin 10mg  nightly   Bipolar depression (HCC) Assessment & Plan: High levels of depression and anxiety today States is having some issues at home financially and concerns for housing Two granddaughters live with her - they make her feel better  Life has not same since losing her husband to ALS Referral to [facility]    Will communicate lab results.   Return in about 4 months (around 09/07/2023) for BP and DMII.   Introduced to Publishing rights manager role and practice setting.  All questions answered.  Discussed provider/patient relationship and expectations.   Sallee Provencal, FNP

## 2023-05-08 NOTE — Assessment & Plan Note (Signed)
 High levels of depression and anxiety today States is having some issues at home financially and concerns for housing Two granddaughters live with her - they make her feel better Life has not same since losing her husband to ALS Referral to [facility]

## 2023-05-08 NOTE — Assessment & Plan Note (Addendum)
 Chronic, at goal previously Last A1C = 6.8 in August 2024 Continue Trulicity, glipizide and lantus Continue assistance with Pharm D for DMII control On statin and arb Continue to recommend balanced, lower carb meals. Smaller meal size, adding snacks. Choosing water as drink of choice and increasing purposeful exercise. Check A1C and urine ACR today F/u 4 months

## 2023-05-08 NOTE — Assessment & Plan Note (Signed)
 Chronic Stable on oxybutynin 10mg  nightly

## 2023-05-08 NOTE — Assessment & Plan Note (Signed)
 States has some housing issues due to financial concerns - did not divulge further Referral to [facility]

## 2023-05-08 NOTE — Assessment & Plan Note (Signed)
 Chronic, controlled Continue PRN albuterol, daily Symbicort, and nightly singular

## 2023-05-09 LAB — CBC
Hematocrit: 41.9 % (ref 34.0–46.6)
Hemoglobin: 13.6 g/dL (ref 11.1–15.9)
MCH: 30.4 pg (ref 26.6–33.0)
MCHC: 32.5 g/dL (ref 31.5–35.7)
MCV: 94 fL (ref 79–97)
Platelets: 237 10*3/uL (ref 150–450)
RBC: 4.48 x10E6/uL (ref 3.77–5.28)
RDW: 12.5 % (ref 11.7–15.4)
WBC: 14.3 10*3/uL — ABNORMAL HIGH (ref 3.4–10.8)

## 2023-05-09 LAB — COMPREHENSIVE METABOLIC PANEL
ALT: 52 IU/L — ABNORMAL HIGH (ref 0–32)
AST: 39 IU/L (ref 0–40)
Albumin: 4.2 g/dL (ref 3.9–4.9)
Alkaline Phosphatase: 105 IU/L (ref 44–121)
BUN/Creatinine Ratio: 18 (ref 12–28)
BUN: 23 mg/dL (ref 8–27)
Bilirubin Total: 0.6 mg/dL (ref 0.0–1.2)
CO2: 22 mmol/L (ref 20–29)
Calcium: 9.5 mg/dL (ref 8.7–10.3)
Chloride: 101 mmol/L (ref 96–106)
Creatinine, Ser: 1.28 mg/dL — ABNORMAL HIGH (ref 0.57–1.00)
Globulin, Total: 2.2 g/dL (ref 1.5–4.5)
Glucose: 306 mg/dL — ABNORMAL HIGH (ref 70–99)
Potassium: 4.4 mmol/L (ref 3.5–5.2)
Sodium: 138 mmol/L (ref 134–144)
Total Protein: 6.4 g/dL (ref 6.0–8.5)
eGFR: 46 mL/min/{1.73_m2} — ABNORMAL LOW (ref >59)

## 2023-05-09 LAB — LIPID PANEL
Chol/HDL Ratio: 3.1 ratio (ref 0.0–4.4)
Cholesterol, Total: 133 mg/dL (ref 100–199)
HDL: 43 mg/dL (ref >39)
LDL Chol Calc (NIH): 46 mg/dL (ref 0–99)
Triglycerides: 289 mg/dL — ABNORMAL HIGH (ref 0–149)
VLDL Cholesterol Cal: 44 mg/dL — ABNORMAL HIGH (ref 5–40)

## 2023-05-09 LAB — MICROALBUMIN / CREATININE URINE RATIO
Creatinine, Urine: 62 mg/dL
Microalb/Creat Ratio: 52 mg/g{creat} — ABNORMAL HIGH (ref 0–29)
Microalbumin, Urine: 32.4 ug/mL

## 2023-05-09 LAB — HEMOGLOBIN A1C
Est. average glucose Bld gHb Est-mCnc: 203 mg/dL
Hgb A1c MFr Bld: 8.7 % — ABNORMAL HIGH (ref 4.8–5.6)

## 2023-05-10 ENCOUNTER — Telehealth: Payer: Self-pay

## 2023-05-10 ENCOUNTER — Encounter: Payer: Self-pay | Admitting: Family Medicine

## 2023-05-10 NOTE — Progress Notes (Signed)
 Complex Care Management Note  Care Guide Note 05/10/2023 Name: Joanna Bishop MRN: [id number] DOB: 08/10/1955  Joanna Bishop is a 68 y.o. year old female who sees Sallee Provencal, FNP for primary care. I reached out to [name] by phone today to offer complex care management services.  [name] was given information about Complex Care Management services today including:   The Complex Care Management services include support from the care team which includes your Nurse Care Manager, Clinical Social Worker, or Pharmacist.  The Complex Care Management team is here to help remove barriers to the health concerns and goals most important to you. Complex Care Management services are voluntary, and the patient may decline or stop services at any time by request to their care team member.   Complex Care Management Consent Status: Patient agreed to services and verbal consent obtained.   Follow up plan:  Telephone appointment with complex care management team member scheduled for:  05/20/2023  Encounter Outcome:  Patient Scheduled  Penne Lash , RMA     [place]  [facility] Guide  Direct Dial: [phone]  Website: [email or web address]

## 2023-05-20 ENCOUNTER — Ambulatory Visit: Payer: Self-pay | Admitting: *Deleted

## 2023-05-20 NOTE — Patient Outreach (Signed)
 Care Coordination   Initial Visit Note   05/20/2023 Name: Joanna Bishop MRN: [id number] DOB: April 17, 1955  Joanna Bishop is a 68 y.o. year old female who sees Joanna Provencal, FNP for primary care. I spoke with  Joanna Bishop by phone today.  What matters to the patients health and wellness today?  Patient confirms depressed mood due to financial challenges.    Goals Addressed             This Visit's Progress    reduce symptoms of depression       Activities and task to complete in order to accomplish goals.   EMOTIONAL / MENTAL HEALTH SUPPORT Keep all upcoming appointment discussed today Continue with compliance of taking medication prescribed by Doctor Continue to work with [facility] for mortgage assistance Continue to utilize healthy coping strategies discussed today to manage depression         SDOH assessments and interventions completed:  Yes  SDOH Interventions Today    Flowsheet Row Most Recent Value  SDOH Interventions   Food Insecurity Interventions Intervention Not Indicated, Community Resources Provided  [place] to list of resources-declined resources]  Housing Interventions Other (Comment)  [working with Joanna Bishop to assist with mortgage]  Transportation Interventions Patient Resources (Friends/Family)  [Joanna Bishop]  Utilities Interventions Community Resources Provided  [employer] Payment Plan with [facility]-social services and [employer] out of funds]        Care Coordination Interventions:  Yes, provided  Interventions Today    Flowsheet Row Most Recent Value  Chronic Disease   Chronic disease during today's visit Other  [Housing Insecurity]  General Interventions   General Interventions Discussed/Reviewed General Interventions Discussed, Science writer assessed for community resource/mental health needs. Patient confirms increased depression related to  financial challenges. Currently behind on mortgage, working with a company to assist with  mortgage, also has made arrangements with utility co.]  Mental Health Interventions   Mental Health Discussed/Reviewed Mental Health Discussed, Coping Strategies, Depression  [Reflective listening, emotional support provided-discussed strategies to cope with depression including managing expectations, perspective, and increasing self care.]  Safety Interventions   Safety Discussed/Reviewed Safety Discussed  [patient denied thoughts of self harm, encouraged to contact [phone] in the event of a mental health crisis]       Follow up plan: Follow up call scheduled for 05/27/23    Encounter Outcome:  Patient Visit Completed

## 2023-05-20 NOTE — Patient Instructions (Signed)
 Visit Information  Thank you for taking time to visit with me today. Please don't hesitate to contact me if I can be of assistance to you.   Following are the goals we discussed today:   Goals Addressed             This Visit's Progress    reduce symptoms of depression       Activities and task to complete in order to accomplish goals.   EMOTIONAL / MENTAL HEALTH SUPPORT Keep all upcoming appointment discussed today Continue with compliance of taking medication prescribed by Doctor Continue to work with [facility] for mortgage assistance Continue to utilize healthy coping strategies discussed today to manage depression         Our next appointment is by telephone on 05/27/23 at 3pm  Please call the care guide team at [phone] if you need to cancel or reschedule your appointment.   If you are experiencing a Mental Health or Behavioral Health Crisis or need someone to talk to, please call the Suicide and Crisis Lifeline: 988   Patient verbalizes understanding of instructions and care plan provided today and agrees to view in MyChart. Active MyChart status and patient understanding of how to access instructions and care plan via MyChart confirmed with patient.     Telephone follow up appointment with care management team member scheduled for: 05/27/23  Verna Czech, LCSW [place]  [facility], [facility] Licensed Clinical Social Worker Care Coordinator  Direct Dial: [phone]

## 2023-05-23 ENCOUNTER — Other Ambulatory Visit: Payer: Self-pay | Admitting: Family Medicine

## 2023-05-23 DIAGNOSIS — N1831 Chronic kidney disease, stage 3a: Secondary | ICD-10-CM

## 2023-05-23 NOTE — Telephone Encounter (Signed)
 Copied from CRM [id number]. Topic: Clinical - Medication Refill >> May 23, 2023  9:17 AM Nyra Capes wrote: Patient called in requesting refill for medication   Most Recent Primary Care Visit:  Provider: Sallee Provencal  Department: [facility]  Visit Type: OFFICE VISIT  Date: 05/08/2023  Medication: Dulaglutide (TRULICITY) 4.5 MG/0.5ML SOAJ  Has the patient contacted their pharmacy? Yes (Agent: If no, request that the patient contact the pharmacy for the refill. If patient does not wish to contact the pharmacy document the reason why and proceed with request.) (Agent: If yes, when and what did the pharmacy advise?)  Is this the correct pharmacy for this prescription? Yes If no, delete pharmacy and type the correct one.  This is the patient's preferred pharmacy:   [facility] - [place], [place] - [address] [facility] [address] [place] [address] [place] [address] Phone: [phone] Fax: [phone]   Has the prescription been filled recently? Yes  Is the patient out of the medication? Yes, patient will need this starting Monday 05/27/23  Has the patient been seen for an appointment in the last year OR does the patient have an upcoming appointment? Yes  Can we respond through MyChart? Yes  Agent: Please be advised that Rx refills may take up to 3 business days. We ask that you follow-up with your pharmacy.  Patient phone # [phone] ok to leave a  detailed message

## 2023-05-24 NOTE — Telephone Encounter (Signed)
 [facility]ide (TRULICITY) 4.5 MG/0.5ML SOAJ6 mL112/18/2024--Sig - Route: Inject 4.5 mg into the skin once a week. - SubcutaneousSent to pharmacy as: Dulaglutide (TRULICITY) 4.5 MG/0.5ML Solution Auto-injectorE-Prescribing Status: Receipt confirmed by pharmacy (02/13/2023  8:46 AM EST)   Requested Prescriptions  Pending Prescriptions Disp Refills   Dulaglutide (TRULICITY) 4.5 MG/0.5ML SOAJ 6 mL 1    Sig: Inject 4.5 mg into the skin once a week.     Endocrinology:  Diabetes - GLP-1 Receptor Agonists Failed - 05/24/2023  1:38 PM      Failed - HBA1C is between 0 and 7.9 and within 180 days    Hgb A1c MFr Bld  Date Value Ref Range Status  05/08/2023 8.7 (H) 4.8 - 5.6 % Final    Comment:             Prediabetes: 5.7 - 6.4          Diabetes: >6.4          Glycemic control for adults with diabetes: <7.0          Passed - Valid encounter within last 6 months    Recent Outpatient Visits           2 weeks ago Hypertension associated with diabetes [facility])   [facility] [place], Caryl Asp A, FNP   1 month ago Type 2 diabetes mellitus with stage 3a chronic kidney disease, without long-term current use of insulin (HCC)   [facility] [place], Jennette Kettle, FNP       Future Appointments             In 3 months Ephriam Knuckles, Jennette Kettle, FNP [facility]

## 2023-05-27 ENCOUNTER — Encounter: Payer: Self-pay | Admitting: *Deleted

## 2023-06-03 ENCOUNTER — Other Ambulatory Visit: Payer: Self-pay | Admitting: Pharmacist

## 2023-06-03 DIAGNOSIS — E1122 Type 2 diabetes mellitus with diabetic chronic kidney disease: Secondary | ICD-10-CM

## 2023-06-03 MED ORDER — FREESTYLE LIBRE 3 PLUS SENSOR MISC: 12 refills | Status: DC

## 2023-06-03 NOTE — Progress Notes (Signed)
 06/03/2023 Name: Joanna Bishop MRN: [id number] DOB: 1955-11-21  Chief Complaint  Patient presents with   Medication Management    Joanna Bishop is a 68 y.o. year old female who presented for a telephone visit.   They were referred to the pharmacist by their PCP for assistance in managing diabetes.      Subjective:  Care Team: Primary Care Provider: Sallee Provencal, FNP ; Next Scheduled Visit: 09/09/2023  Social Worker: Verna Czech [place], [place]; Next Scheduled Visit: 06/04/2023 Dietitian: Darrick Grinder, RD; Next Scheduled Visit: 06/05/2023  Medication Access/Adherence  Current Pharmacy:  [id number] - [address] ([address]), [place] - [address] ([address]) [place] [address] Phone: [phone] Fax: [phone]  [facility] - [place], [place] - [address] [facility] [address] [place] [address] Phone: [phone] Fax: [phone]   Patient reports affordability concerns with their medications: No  Patient reports access/transportation concerns to their pharmacy: No  Patient reports adherence concerns with their medications:  No     Note patient currently uses both [facility] mail order pharmacy and [facility]   Uses weekly pillbox for AM and PM medicine and Dorothyann Gibbs App for daily reminders on her phone   Diabetes:   Note patient scheduled for appointment with dietitian on 06/05/2023  Current medications:  - Trulicity 4.5 mg weekly on Mondays - glipizide ER 10 mg daily every morning - Lantus 36 units daily   Medications tried in the past: metformin (GI side effects)   Reports restarted using Freestyle Libre 3 continuous   Date of Download: 06/03/23 % Time CGM is active: 47% Average Glucose: 208 mg/dL Glucose Management Indicator: 8.3%  Glucose Variability: 29.3% (goal <36%) Time in Goal:  - Time in range 70-180: 34% - Time above range: 66% - Time below range: 0%   Attributes elevated readings  to increased intake of sweets and pasta   Current dietary habits: Breakfast: oatmeal or peanut butter on tortilla or scrambled eggs Lunch: chicken salad on crackers  Supper: chicken or beef with veggies (salad) or potato or mac and cheese Drinks: water or diet Dr. Reino Kent Snacks: snack cakes, fruit snacks  Patient denies hypoglycemic s/sx including dizziness, shakiness, sweating - Patient carries glucose tablets    Current physical activity: walking limited by knee pain   Statin: rosuvastatin 5 mg daily     Asthma:   Current medications: - Symbicort 80-4.5 mcg inhaler - 2 puffs twice daily - albuterol inhaler 1-2 puffs every 4-6 hours as needed for wheezing and/or shortness of breath - montelukast 10 mg at bedtime   Avoids triggers (going outside heat, overexertion, allergens in Spring and Fall)     Hypertension:   Current medications:  - losartan 100 mg daily - propranolol ER 60 mg daily (takes for migraine prevention)     Patient has a home upper arm home BP cuff Denies checking home blood pressure recently  Current physical activity: walking limited by knee pain   Objective:  Lab Results  Component Value Date   HGBA1C 8.7 (H) 05/08/2023    Lab Results  Component Value Date   CREATININE 1.28 (H) 05/08/2023   BUN 23 05/08/2023   NA 138 05/08/2023   K 4.4 05/08/2023   CL 101 05/08/2023   CO2 22 05/08/2023    Lab Results  Component Value Date   CHOL 133 05/08/2023   HDL 43 05/08/2023   LDLCALC 46 05/08/2023   TRIG 289 (  H) 05/08/2023   CHOLHDL 3.1 05/08/2023   BP Readings from Last 3 Encounters:  05/08/23 112/80  03/12/23 137/86  03/07/23 137/83   Pulse Readings from Last 3 Encounters:  05/08/23 (!) 102  03/12/23 92  03/07/23 92    Medications Reviewed Today     Reviewed by Manuela Neptune, RPH-CPP (Pharmacist) on 06/03/23 at [phone]  Med List Status: <None>   Medication Order Taking? Sig Documenting Provider Last Dose Status Informant   acetaminophen (TYLENOL) 650 MG CR tablet [id number]  Take 650 mg by mouth every 8 (eight) hours as needed for pain. [provider]  Active   albuterol (VENTOLIN HFA) 108 (90 Base) MCG/ACT inhaler [id number] Yes INHALE 1-2 PUFFS BY MOUTH into THE lungs EVERY 4 TO 6 HOURS AS NEEDED FOR WHEEZING AND/OR SHORTNESS OF BREATH Alfredia Ferguson, PA-C Taking Active   blood glucose meter kit and supplies [id number]  Dispense based on patient and insurance preference. Use daily as directed. 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 Erasmo Downer, MD  Active   budesonide-formoterol [facility]) 80-4.5 MCG/ACT inhaler [id number] Yes Inhale 2 puffs into the lungs 2 (two) times daily. Simmons-Robinson, Makiera, MD Taking Active   Continuous Glucose Sensor (FREESTYLE LIBRE 3 SENSOR) [place] [id number]  Place 1 sensor on the skin every 14 days. Use to check glucose continuously Drubel, Lillia Abed, PA-C  Active   Dulaglutide (TRULICITY) 4.5 MG/0.5ML Ivory Broad [id number] Yes Inject 4.5 mg into the skin once a week. Jacky Kindle, FNP Taking Active   glipiZIDE (GLUCOTROL XL) 10 MG 24 hr tablet [id number] Yes Take 1 tablet (10 mg total) by mouth every morning. Sallee Provencal, FNP Taking Active   glucose blood (ONETOUCH VERIO) test strip [id number]  Use to check blood sugars once daily as instructed Erasmo Downer, MD  Active   insulin glargine (LANTUS SOLOSTAR) 100 UNIT/ML Solostar Pen [id number] Yes Inject 36 Units into the skin daily. Sallee Provencal, FNP Taking Active   Insulin Pen Needle (BD PEN NEEDLE NANO U/F) 32G X 4 MM MISC [id number]  Use to inject insulin daily as directed Drubel, Lillia Abed, PA-C  Active   ipratropium (ATROVENT) 0.03 % nasal spray [id number]  Place 2 sprays into both nostrils every 12 (twelve) hours. Valinda Hoar, NP  Active   losartan (COZAAR) 100 MG tablet [id number] Yes Take 1 tablet (100 mg total) by mouth daily. Sallee Provencal, FNP Taking Active   montelukast (SINGULAIR) 10 MG tablet [id number] Yes Take  1 tablet (10 mg total) by mouth at bedtime. Sallee Provencal, FNP Taking Active   Multiple Vitamin (MULTIVITAMIN WITH MINERALS) TABS tablet [id number]  Take 1 tablet by mouth daily. Centrum Silver Women's 50+ [provider]  Active   NON FORMULARY [id number]  Take 1 capsule by mouth in the morning and at bedtime. AZO Bladder Control Supplement [provider]  Active   OneTouch Delica Lancets 33G MISC [id number]  Use to check blood sugars once daily as directed. Erasmo Downer, MD  Active   oxybutynin (DITROPAN-XL) 10 MG 24 hr tablet [id number]  Take 1 tablet (10 mg total) by mouth at bedtime. Sallee Provencal, FNP  Active   propranolol ER (INDERAL LA) 60 MG 24 hr capsule [id number] Yes Take 1 capsule (60 mg total) by mouth daily. Sallee Provencal, FNP Taking Active   rosuvastatin (CRESTOR) 5 MG tablet [id number]  Take 1 tablet (5 mg total) by mouth daily. Sallee Provencal, FNP  Active  Assessment/Plan:   Diabetes: - Have reviewed long term cardiovascular and renal outcomes of uncontrolled blood sugar - Have reviewed goal A1c, goal fasting, and goal 2 hour post prandial glucose - Reviewed dietary modifications including Encourage patient to have well-balanced meals and snacks spread throughout the day, while controlling carbohydrate portion sizes Discuss balanced snack ideas Encourage patient to increase consumption of non-starchy vegetables Encourage to review nutrition labels for carbohydrate content of foods - Recommend to continue to use Freestyle Libre CGM to monitor blood sugar/as feedback on dietary choices - As Freestyle Libre 3 CGM is being discontinued by manufacturer this year, will send prescription to pharmacy (per protocol) to switch patient to [employer] 3 Plus sensors.              Counsel that [employer] 3 Plus sensors each last 15 days - Recommend to check glucose with fingerstick check when needed for symptoms and as back  up to CGM. Patient to contact office if needed for readings outside of established parameters or symptoms     Hypertension: - Have encouraged patient to continue to take positional changes slowly - Recommend to monitor home blood pressure, keep log of results and have this record to review at upcoming medical appointments. Patient to contact provider office sooner if needed for readings outside of established parameters or symptoms     Asthma: - Patient to continue to take medications consistently and contact office if needed for increased albuterol use/symptoms     Follow Up Plan: Clinical Pharmacist will follow up with patient by telephone on 07/17/2023 at 9:30 AM       Estelle Grumbles, PharmD, [facility] [phone]

## 2023-06-03 NOTE — Patient Instructions (Signed)
 Goals Addressed             This Visit's Progress    Pharmacy Goals       Our goal A1c is less than 7%. This corresponds with fasting sugars less than 130 and 2 hour after meal sugars less than 180. Please keep a log of your results when checking your blood sugar   Our goal bad cholesterol, or LDL, is less than 70 . This is why it is important to continue taking your rosuvastatin.  Check your blood pressure twice weekly, and any time you have concerning symptoms like headache, chest pain, dizziness, shortness of breath, or vision changes.   Our goal is less than 130/80.  To appropriately check your blood pressure, make sure you do the following:  1) Avoid caffeine, exercise, or tobacco products for 30 minutes before checking. Empty your bladder. 2) Sit with your back supported in a flat-backed chair. Rest your arm on something flat (arm of the chair, table, etc). 3) Sit still with your feet flat on the floor, resting, for at least 5 minutes.  4) Check your blood pressure. Take 1-2 readings.  5) Write down these readings and bring with you to any provider appointments.  Bring your home blood pressure machine with you to a provider's office for accuracy comparison at least once a year.   Make sure you take your blood pressure medications before you come to any office visit, even if you were asked to fast for labs.   Estelle Grumbles, PharmD, [facility] [phone]

## 2023-06-04 ENCOUNTER — Other Ambulatory Visit: Payer: Self-pay

## 2023-06-04 ENCOUNTER — Ambulatory Visit: Payer: Self-pay | Admitting: *Deleted

## 2023-06-04 NOTE — Patient Outreach (Signed)
 Complex Care Management   Visit Note  06/04/2023  Name:  Joanna Bishop MRN: [id number] DOB: January 30, 1956  Situation: Referral received for Complex Care Management related to SDOH Barriers:  Housing barriers Financial Resource Strain I obtained verbal consent from patient.  Visit completed with patient on the phone  Background:   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)    Vertigo     Assessment: Patient Reported Symptoms:  Cognitive Able to follow simple commands, Alert and oriented to person, place, and time, Insightful and able to interpret abstract concepts  Neurological      HEENT No symptoms reported    Cardiovascular No symptoms reported    Respiratory      Endocrine Blurry vision, Fast heartbeat, Irritability, Headaches, Increased urination, Increased thirst, Shortness of breath    Gastrointestinal No symptoms reported    Genitourinary Incontinence    Integumentary Not assessed    Musculoskeletal Difficulty walking, Unsteady gait    Psychosocial Depression - if selected complete PHQ 2-9     There were no vitals filed for this visit.  Medications Reviewed Today     Reviewed by Wenda Overland, LCSW (Social Worker) on 06/04/23 at 1012  Med List Status: <None>   Medication Order Taking? Sig Documenting Provider Last Dose Status Informant  acetaminophen (TYLENOL) 650 MG CR tablet [id number] No Take 650 mg by mouth every 8 (eight) hours as needed for pain. [provider] Taking Active   albuterol (VENTOLIN HFA) 108 (90 Base) MCG/ACT inhaler [id number] No INHALE 1-2 PUFFS BY MOUTH into THE lungs EVERY 4 TO 6 HOURS  AS NEEDED FOR WHEEZING AND/OR SHORTNESS OF BREATH Alfredia Ferguson, PA-C Taking Active   blood glucose meter kit and supplies [id number] No Dispense based on patient and insurance preference. Use daily as directed. 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 Erasmo Downer, MD Taking Active   budesonide-formoterol [facility]) 80-4.5 MCG/ACT inhaler [id number] No Inhale 2 puffs into the lungs 2 (two) times daily. Simmons-Robinson, Tawanna Cooler, MD Taking Active   Continuous Glucose Sensor (FREESTYLE LIBRE 3 PLUS SENSOR) MISC [id number]  Place 1 sensor on the skin every 15 days. Use to check glucose continuously Charlcie Cradle A, FNP  Active   Dulaglutide (TRULICITY) 4.5 MG/0.5ML Ivory Broad [id number] No Inject 4.5 mg into the skin once a week. Jacky Kindle, FNP Taking Active   glipiZIDE (GLUCOTROL XL) 10 MG 24 hr tablet [id number] No Take 1 tablet (10 mg total) by mouth every morning. Sallee Provencal, FNP Taking Active   glucose blood (ONETOUCH VERIO) test strip [id number] No Use to check blood sugars once daily as instructed Bacigalupo, Marzella Schlein, MD Taking Active   insulin glargine (LANTUS SOLOSTAR) 100 UNIT/ML Solostar Pen [id number] No Inject 36 Units into the skin daily. Sallee Provencal, FNP Taking Active   Insulin Pen Needle (BD PEN NEEDLE NANO U/F) 32G X 4 MM MISC [id number] No Use to inject insulin daily as directed Alfredia Ferguson, PA-C Taking Active   ipratropium (ATROVENT) 0.03 % nasal spray [id number] No Place 2 sprays into both nostrils every 12 (twelve) hours. Salli Quarry  R, NP Taking Active   losartan (COZAAR) 100 MG tablet [id number] No Take 1 tablet (100 mg total) by mouth daily. Sallee Provencal, FNP Taking Active   montelukast (SINGULAIR) 10 MG tablet [id number] No Take 1 tablet (10 mg total) by mouth at bedtime. Sallee Provencal, FNP Taking Active   Multiple Vitamin (MULTIVITAMIN WITH MINERALS) TABS tablet [id number] No Take 1 tablet by mouth daily. Centrum Silver Women's 50+ [provider] Taking Active    NON FORMULARY [id number] No Take 1 capsule by mouth in the morning and at bedtime. AZO Bladder Control Supplement [provider] Taking Active   OneTouch Delica Lancets 33G MISC [id number] No Use to check blood sugars once daily as directed. Erasmo Downer, MD Taking Active   oxybutynin (DITROPAN-XL) 10 MG 24 hr tablet [id number] No Take 1 tablet (10 mg total) by mouth at bedtime. Sallee Provencal, FNP Taking Active   propranolol ER (INDERAL LA) 60 MG 24 hr capsule [id number] No Take 1 capsule (60 mg total) by mouth daily. Sallee Provencal, FNP Taking Active   rosuvastatin (CRESTOR) 5 MG tablet [id number] No Take 1 tablet (5 mg total) by mouth daily. Sallee Provencal, FNP Taking Active             Recommendation:   CSW to follow up with patient regarding community resources to address financial strain   Follow Up Plan:   Telephone follow-up 06/14/23  Verna Czech, LCSW [place]  [facility], [facility] Licensed Clinical Social Worker Care Coordinator  Direct Dial: [phone]

## 2023-06-04 NOTE — Patient Instructions (Signed)
 Visit Information  Thank you for taking time to visit with me today. Please don't hesitate to contact me if I can be of assistance to you before our next scheduled appointment.  Our next appointment is by telephone on 06/14/23 at 10am Please call the care guide team at [phone] if you need to cancel or reschedule your appointment.   Following is a copy of your care plan:   Goals Addressed             This Visit's Progress    VBCI Social Work Care Plan       Problems:   Financial constraints related to inability to pay mortgage and additional household bills, Lacks knowledge of community resources related to financial crisis assistance, Lacks knowledge of how to connect to [employer] care resources.  CSW Clinical Goal(s):   Over the next 90 days  Patient will explore community resource options for unmet needs related to Financial Strain .  Interventions:  Social Determinants of Health in Patient with Depression: SDOH assessments completed: Depression  , Financial Strain , Geophysicist/field seismologist , Housing , Intimate Partner Violence, and Stress Evaluation of current treatment plan related to unmet needs Discussed continued follow up with the [facility] for mortgage support Discussed/encouraged contacting the Dept of Social Services in July to request available crisis assistance  Discussed/encouraged Systems analyst for available resources to assist with utilities Discussed/encouraged contacting [employer] for possible financial supports as well as the [employer] Discussed Applying for the Self Sufficiency program through [employer]  CSW will provide contact information for the [employer] program   Patient Goals/Self-Care Activities:  Patient to continue to follow up the [facility] regarding mortgage payment support as well as the Department of Social Services and [employer] for crisis  support and enrollment in the self sufficiency program   Plan:    Telephone follow up appointment with care management team member scheduled for:  06/14/23        Please call 911 if you are experiencing a Mental Health or Behavioral Health Crisis or need someone to talk to.  Patient verbalizes understanding of instructions and care plan provided today and agrees to view in MyChart. Active MyChart status and patient understanding of how to access instructions and care plan via MyChart confirmed with patient.     Dewayne Severe, LCSW [place]  [facility], [facility] Licensed Clinical Social Worker Care Coordinator  Direct Dial: [phone]

## 2023-06-05 ENCOUNTER — Ambulatory Visit: Payer: Medicare HMO | Admitting: Dietician

## 2023-06-14 ENCOUNTER — Other Ambulatory Visit: Payer: Self-pay | Admitting: *Deleted

## 2023-06-15 NOTE — Patient Outreach (Signed)
 Complex Care Management   Visit Note  06/15/2023 late entry  Name:  Joanna Bishop MRN: [id number] DOB: April 06, 1955  Situation: Referral received for Complex Care Management related to [facility]/Behavioral Health diagnosis depression and SDOH Barriers:  Financial Resource Strain I obtained verbal consent from Patient.  Visit completed with patient  on the phone  Background:   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)    Vertigo     Assessment: Patient Reported Symptoms:  Cognitive        Neurological Neurological Review of Symptoms: No symptoms reported    HEENT        Cardiovascular Cardiovascular Symptoms Reported: No symptoms reported    Respiratory Respiratory Symptoms Reported: Dry cough Additional Respiratory Details: uses cough drops, cough syrup if needed Respiratory Conditions: Asthma Respiratory Self-Management Outcome: 4 (good)  Endocrine Patient reports the following symptoms related to hypoglycemia or hyperglycemia : Blurry vision, Fast heartbeat, Irritability, Increased urination, Increased thirst, Shortness of breath Is patient diabetic?: Yes Is patient checking blood sugars at home?: Yes Endocrine Conditions: Osteoporosis Endocrine Management Strategies: Medication therapy, Diet modification Endocrine Self-Management Outcome: 3 (uncertain) Endocrine Comment: "I have good days and bad days"  Gastrointestinal        Genitourinary Genitourinary Symptoms Reported: Incontinence Additional Genitourinary Details: mostly due to diabetes Genitourinary Conditions: Incontinence Genitourinary Management Strategies: Diet  modification, Medication therapy Genitourinary Self-Management Outcome: 4 (good)  Integumentary Integumentary Symptoms Reported: No symptoms reported    Musculoskeletal          Psychosocial Psychosocial Symptoms Reported: Sadness - if selected complete PHQ 2-9 Behavioral Health Conditions: Depression Behavioral Management Strategies: Coping strategies, Counseling Behavioral Health Self-Management Outcome: 4 (good) Major Change/Loss/Stressor/Fears (CP): Resources Behaviors When Feeling Stressed/Fearful: becomes task oriented Techniques to Cope with Loss/Stress/Change: Diversional activities Quality of Family Relationships: supportive Do you feel physically threatened by others?: No      06/14/2023   10:33 AM  Depression screen PHQ 2/9  Decreased Interest 1  Down, Depressed, Hopeless 1  PHQ - 2 Score 2  Altered sleeping 1  Tired, decreased energy 1  Change in appetite 0  Feeling bad or failure about yourself  0  Trouble concentrating 1  Moving slowly or fidgety/restless 0  Suicidal thoughts 0  PHQ-9 Score 5  Difficult doing work/chores Somewhat difficult    There were no vitals filed for this visit.  Medications Reviewed Today     Reviewed by Ave Leisure, LCSW (Social Worker) on 06/14/23 at 1037  Med List Status: <None>   Medication Order Taking? Sig Documenting Provider Last Dose Status Informant  acetaminophen  (TYLENOL ) 650 MG CR tablet [id number] Yes Take 650 mg by mouth every 8 (eight) hours as needed for pain. [provider] Taking Active   albuterol  (VENTOLIN  HFA) 108 (90 Base) MCG/ACT inhaler [id number] Yes INHALE 1-2 PUFFS BY MOUTH into THE lungs EVERY 4 TO 6 HOURS AS NEEDED FOR WHEEZING AND/OR SHORTNESS OF BREATH Trenton Frock, PA-C Taking Active   blood glucose meter kit and supplies [id number] Yes Dispense based on patient and insurance preference. Use daily as  directed. 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 Mazie Speed, MD Taking Active    budesonide -formoterol  (SYMBICORT ) 80-4.5 MCG/ACT inhaler [id number] Yes Inhale 2 puffs into the lungs 2 (two) times daily. Simmons-Robinson, Judyann Number, MD Taking Active   Continuous Glucose Sensor (FREESTYLE LIBRE 3 PLUS SENSOR) MISC [id number] Yes Place 1 sensor on the skin every 15 days. Use to check glucose continuously Tasia Farr, FNP Taking Active   Dulaglutide  (TRULICITY ) 4.5 MG/0.5ML [place] [id number] Yes Inject 4.5 mg into the skin once a week. Normie Becton, FNP Taking Active   glipiZIDE  (GLUCOTROL  XL) 10 MG 24 hr tablet [id number] Yes Take 1 tablet (10 mg total) by mouth every morning. Tasia Farr, FNP Taking Active   glucose blood (ONETOUCH VERIO) test strip [id number] Yes Use to check blood sugars once daily as instructed Bacigalupo, Angela M, MD Taking Active   insulin glargine  (LANTUS  SOLOSTAR) 100 UNIT/ML Solostar Pen [id number] Yes Inject 36 Units into the skin daily. Tasia Farr, FNP Taking Active   Insulin Pen Needle (BD PEN NEEDLE NANO U/F) 32G X 4 MM MISC [id number] Yes Use to inject insulin daily as directed Trenton Frock, PA-C Taking Active   ipratropium (ATROVENT ) 0.03 % nasal spray [id number] Yes Place 2 sprays into both nostrils every 12 (twelve) hours. Reena Canning, NP Taking Active   losartan  (COZAAR ) 100 MG tablet [id number] Yes Take 1 tablet (100 mg total) by mouth daily. Tasia Farr, FNP Taking Active   montelukast  (SINGULAIR ) 10 MG tablet [id number] Yes Take 1 tablet (10 mg total) by mouth at bedtime. Clifton, Kellie A, FNP Taking Active   Multiple Vitamin (MULTIVITAMIN WITH MINERALS) TABS tablet [id number] Yes Take 1 tablet by mouth daily. Centrum Silver Women's 50+ [provider] Taking Active   NON FORMULARY [id number] Yes Take 1 capsule by mouth in the morning and at bedtime. AZO Bladder Control Supplement [provider] Taking Active   OneTouch Delica Lancets 33G MISC [id number] Yes Use to check blood sugars once daily as directed.  Mazie Speed, MD Taking Active   oxybutynin  (DITROPAN -XL) 10 MG 24 hr tablet [id number] Yes Take 1 tablet (10 mg total) by mouth at bedtime. Tasia Farr, FNP Taking Active   propranolol  ER (INDERAL  LA) 60 MG 24 hr capsule [id number] Yes Take 1 capsule (60 mg total) by mouth daily. Tasia Farr, FNP Taking Active   rosuvastatin  (CRESTOR ) 5 MG tablet [id number] Yes Take 1 tablet (5 mg total) by mouth daily. Tasia Farr, FNP Taking Active             Recommendation:   No provider recommendations at this time.   Follow Up Plan:   Patient has met all care management goals. Care Management case will be closed. Patient has been provided contact information should new needs arise.   Jamori Biggar, LCSW [place]  [facility], [facility] Licensed Clinical Social Worker Care Coordinator  Direct Dial: [phone]

## 2023-06-15 NOTE — Patient Instructions (Signed)
 Visit Information  Thank you for taking time to visit with me today. Please don't hesitate to contact me if I can be of assistance to you before our next scheduled appointment.   Patient has met all care management goals. Care Management case will be closed. Patient has been provided contact information should new needs arise.   Please call the care guide team at [phone] if you need to cancel, schedule, or reschedule an appointment.   Please call 911 if you are experiencing a Mental Health or Behavioral Health Crisis or need someone to talk to.  Louann Hopson, LCSW [place]  [facility], [facility] Licensed Clinical Social Worker Care Coordinator  Direct Dial: [phone]

## 2023-06-20 ENCOUNTER — Ambulatory Visit
Admission: RE | Admit: 2023-06-20 | Discharge: 2023-06-20 | Disposition: A | Discharge status: Home / Self Care | Source: Ambulatory Visit | Attending: Family Medicine | Admitting: Family Medicine

## 2023-06-20 VITALS — BP 141/64 | HR 103 | Temp 98.2°F | Resp 18

## 2023-06-20 DIAGNOSIS — H9201 Otalgia, right ear: Secondary | ICD-10-CM

## 2023-06-20 NOTE — ED Triage Notes (Signed)
 Pt presents with right ear pain x 2 days. Pt has taken tylenol  for the pain.

## 2023-06-20 NOTE — ED Provider Notes (Signed)
 [facility]    CSN: [id number] Arrival date & time: 06/20/23  1301      History   Chief Complaint Chief Complaint  Patient presents with   Otalgia    HPI Joanna Bishop is a 68 y.o. female presents for ear pain.  Patient reports 2 days of right ear pain.  No drainage, hearing changes, URI symptoms, fevers.  Does report she had some tenderness along her scalp on the right side prior to onset of ear pain.  No visible rashes.  No history of shingles.  She is not sure if she has been vaccinated for shingles.  Has been taking Tylenol  over-the-counter.  No other concerns at this time.   Otalgia   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)    Vertigo     Patient Active Problem List   Diagnosis Date Noted   Housing insecurity 05/08/2023   Seasonal allergic rhinitis due to pollen 04/17/2023   Moderate persistent asthma with exacerbation 10/18/2022   Microalbuminuria due to type 2 diabetes mellitus (HCC) 05/02/2022   Migraine with aura and without status migrainosus, not intractable 06/14/2021   Peptic ulcer disease with hemorrhage 05/12/2021   Hypertension associated with diabetes (HCC) 05/02/2021   Chronic 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 05/02/2021   OAB (overactive bladder) 01/12/2021   History of total knee arthroplasty 05/09/2020   Type 2 diabetes mellitus with stage 3a chronic kidney disease, without long-term current use of insulin (HCC) 10/17/2015   Carpal tunnel syndrome of left wrist 10/14/2015   Bipolar depression (HCC) 07/12/2015   Asthmatic bronchitis 08/02/2014   Hyperlipidemia associated  with type 2 diabetes mellitus (HCC) 08/02/2014   History of suicidal ideation 08/02/2014   Avitaminosis D 08/02/2014   Generalized hyperhidrosis 08/02/2014   Airway hyperreactivity 08/02/2014   Anxiety disorder 08/02/2014   Insomnia 08/02/2014    Past Surgical History:  Procedure Laterality Date   ABDOMINAL HYSTERECTOMY     ARTHROPLASTY     CATARACT EXTRACTION W/PHACO Left 02/21/2023   Procedure: CATARACT EXTRACTION PHACO AND INTRAOCULAR LENS PLACEMENT (IOC) LEFT DIABETIC 7.98 00:49.7;  Surgeon: Trudi Fus, MD;  Location: [facility];  Service: Ophthalmology;  Laterality: Left;   CATARACT EXTRACTION W/PHACO Right 03/07/2023   Procedure: CATARACT EXTRACTION PHACO AND INTRAOCULAR LENS PLACEMENT (IOC) RIGHT DIABETIC;  Surgeon: Trudi Fus, MD;  Location: [facility];  Service: Ophthalmology;  Laterality: Right;  5.73 0:39.9   CHOLECYSTECTOMY     FEMUR FRACTURE SURGERY     JOINT REPLACEMENT  12/16/2010   TOTAL KNEE ARTHROPLASTY Left    TUBAL LIGATION  10/08/1986    OB History   No obstetric history on file.      Home Medications    Prior to Admission medications   Medication Sig Start Date End Date Taking? Authorizing Provider  acetaminophen  (TYLENOL ) 650 MG CR tablet Take 650 mg by mouth every 8 (eight) hours as needed for pain.    [provider]  albuterol  (VENTOLIN  HFA) 108 (90 Base) MCG/ACT inhaler INHALE 1-2 PUFFS BY MOUTH  into THE lungs EVERY 4 TO 6 HOURS AS NEEDED FOR WHEEZING AND/OR SHORTNESS OF BREATH 08/01/22   Trenton Frock, PA-C  blood glucose meter kit and supplies Dispense based on patient and insurance preference. Use daily as directed. 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 01/23/21   Bacigalupo, Angela M, MD  budesonide -formoterol  (SYMBICORT ) 80-4.5 MCG/ACT inhaler Inhale 2 puffs into the lungs 2 (two) times daily. 10/17/22   Simmons-Robinson, Makiera, MD  Continuous Glucose Sensor (FREESTYLE LIBRE 3 PLUS SENSOR) MISC Place 1 sensor on  the skin every 15 days. Use to check glucose continuously 06/03/23   Clifton, Kellie A, FNP  Dulaglutide  (TRULICITY ) 4.5 MG/0.5ML SOAJ Inject 4.5 mg into the skin once a week. 02/13/23   Normie Becton, FNP  glipiZIDE  (GLUCOTROL  XL) 10 MG 24 hr tablet Take 1 tablet (10 mg total) by mouth every morning. 04/17/23   Tasia Farr, FNP  glucose blood (ONETOUCH VERIO) test strip Use to check blood sugars once daily as instructed 02/07/21   Bacigalupo, Angela M, MD  insulin glargine  (LANTUS  SOLOSTAR) 100 UNIT/ML Solostar Pen Inject 36 Units into the skin daily. 04/17/23   Clifton, Kellie A, FNP  Insulin Pen Needle (BD PEN NEEDLE NANO U/F) 32G X 4 MM MISC Use to inject insulin daily as directed 05/28/22   Drubel, Heidi Llamas, PA-C  ipratropium (ATROVENT ) 0.03 % nasal spray Place 2 sprays into both nostrils every 12 (twelve) hours. 12/07/22   White, Maybelle Spatz, NP  losartan  (COZAAR ) 100 MG tablet Take 1 tablet (100 mg total) by mouth daily. 04/17/23   Tasia Farr, FNP  montelukast  (SINGULAIR ) 10 MG tablet Take 1 tablet (10 mg total) by mouth at bedtime. 04/17/23   Clifton, Kellie A, FNP  Multiple Vitamin (MULTIVITAMIN WITH MINERALS) TABS tablet Take 1 tablet by mouth daily. Centrum Silver Women's 50+    [provider]  NON FORMULARY Take 1 capsule by mouth in the morning and at bedtime. AZO Bladder Control Supplement    [provider]  OneTouch Delica Lancets 33G MISC Use to check blood sugars once daily as directed. 02/07/21   Bacigalupo, Angela M, MD  oxybutynin  (DITROPAN -XL) 10 MG 24 hr tablet Take 1 tablet (10 mg total) by mouth at bedtime. 04/17/23   Tasia Farr, FNP  propranolol  ER (INDERAL  LA) 60 MG 24 hr capsule Take 1 capsule (60 mg total) by mouth daily. 04/17/23   Tasia Farr, FNP  rosuvastatin  (CRESTOR ) 5 MG tablet Take 1 tablet (5 mg total) by mouth daily. 04/17/23   Tasia Farr, FNP    Family History Family History  Problem Relation Age of Onset   Diabetes  Mother    COPD Mother    Diabetes Father    Coronary artery disease Father    Coronary artery disease Maternal Grandmother    Heart attack Maternal Grandmother    Diabetes Paternal Grandmother     Social History Social History   Tobacco Use   Smoking status: Never   Smokeless tobacco: Never  Vaping Use   Vaping status: Never Used  Substance Use Topics   Alcohol use: Not Currently    Comment: Maybe 6 beers a year   Drug use: Never     Allergies   Penicillins   Review of Systems Review of Systems  HENT:  Positive for ear pain.      Physical Exam Triage Vital Signs ED Triage Vitals  Encounter Vitals Group     BP 06/20/23 1315 (!) 141/64  Systolic BP Percentile --      Diastolic BP Percentile --      Pulse Rate 06/20/23 1315 (!) 103     Resp 06/20/23 1315 18     Temp 06/20/23 1315 98.2 F (36.8 C)     Temp Source 06/20/23 1315 Oral     SpO2 06/20/23 1315 95 %     Weight --      Height --      Head Circumference --      Peak Flow --      Pain Score 06/20/23 1314 5     Pain Loc --      Pain Education --      Exclude from Growth Chart --    No data found.  Updated Vital Signs BP (!) 141/64 (BP Location: Right Arm)   Pulse (!) 103   Temp 98.2 F (36.8 C) (Oral)   Resp 18   SpO2 95%   Visual Acuity Right Eye Distance:   Left Eye Distance:   Bilateral Distance:    Right Eye Near:   Left Eye Near:    Bilateral Near:     Physical Exam Vitals and nursing note reviewed.  Constitutional:      General: She is not in acute distress.    Appearance: Normal appearance. She is not ill-appearing.  HENT:     Head: Normocephalic and atraumatic.     Right Ear: Tympanic membrane and ear canal normal.     Left Ear: Tympanic membrane and ear canal normal.     Nose: Nose normal.  Eyes:     Pupils: Pupils are equal, round, and reactive to light.  Cardiovascular:     Rate and Rhythm: Tachycardia present.     Comments: Mildly tach at 103 likely in setting  of deconditioning Pulmonary:     Effort: Pulmonary effort is normal.  Skin:    General: Skin is warm and dry.     Findings: No rash.  Neurological:     General: No focal deficit present.     Mental Status: She is alert and oriented to person, place, and time.  Psychiatric:        Mood and Affect: Mood normal.        Behavior: Behavior normal.      UC Treatments / Results  Labs (all labs ordered are listed, but only abnormal results are displayed) Labs Reviewed - No data to display  EKG   Radiology No results found.  Procedures Procedures (including critical care time)  Medications Ordered in UC Medications - No data to display  Initial Impression / Assessment and Plan / UC Course  I have reviewed the triage vital signs and the nursing notes.  Pertinent labs & imaging results that were available during my care of the patient were reviewed by me and considered in my medical decision making (see chart for details).     Reviewed exam and symptoms with patient.  No red flags.  No signs of otitis media or externa on exam.  No visible rashes on scalp face or ear.  Advised to continue Tylenol  and can do warm compresses to the area as needed.  Follow-up with PCP if symptoms do not improve.  ER precautions reviewed and patient verbalized understanding. Final Clinical Impressions(s) / UC Diagnoses   Final diagnoses:  Otalgia of right ear     Discharge Instructions      Continue to monitor symptoms and you may take over-the-counter Tylenol  or  ibuprofen as needed.  Follow-up with your PCP if your symptoms do not improve.  Please go to the ER if you develop any worsening symptoms.  Hope you feel better soon!    ED Prescriptions   None    PDMP not reviewed this encounter.   Alleen Arbour, NP 06/20/23 1331

## 2023-06-20 NOTE — Discharge Instructions (Addendum)
 Continue to monitor symptoms and you may take over-the-counter Tylenol  or ibuprofen as needed.  Follow-up with your PCP if your symptoms do not improve.  Please go to the ER if you develop any worsening symptoms.  Hope you feel better soon!

## 2023-06-21 ENCOUNTER — Ambulatory Visit: Admitting: Family Medicine

## 2023-06-26 ENCOUNTER — Other Ambulatory Visit: Payer: Medicare HMO | Admitting: Pharmacist

## 2023-07-17 ENCOUNTER — Telehealth: Payer: Self-pay | Admitting: Pharmacist

## 2023-07-17 ENCOUNTER — Other Ambulatory Visit: Payer: Self-pay

## 2023-07-17 ENCOUNTER — Other Ambulatory Visit: Payer: Self-pay | Admitting: Pharmacist

## 2023-07-17 DIAGNOSIS — E1159 Type 2 diabetes mellitus with other circulatory complications: Secondary | ICD-10-CM

## 2023-07-17 DIAGNOSIS — J301 Allergic rhinitis due to pollen: Secondary | ICD-10-CM

## 2023-07-17 DIAGNOSIS — N3281 Overactive bladder: Secondary | ICD-10-CM

## 2023-07-17 DIAGNOSIS — N1831 Chronic kidney disease, stage 3a: Secondary | ICD-10-CM

## 2023-07-17 DIAGNOSIS — G43109 Migraine with aura, not intractable, without status migrainosus: Secondary | ICD-10-CM

## 2023-07-17 MED ORDER — OXYBUTYNIN CHLORIDE ER 10 MG PO TB24: 10.0000 mg | ORAL_TABLET | Freq: Every day | ORAL | 0 refills | Status: DC

## 2023-07-17 MED ORDER — PROPRANOLOL HCL ER 60 MG PO CP24: 60.0000 mg | ORAL_CAPSULE | Freq: Every day | ORAL | 0 refills | Status: DC

## 2023-07-17 MED ORDER — MONTELUKAST SODIUM 10 MG PO TABS: 10.0000 mg | ORAL_TABLET | Freq: Every day | ORAL | 0 refills | Status: DC

## 2023-07-17 MED ORDER — GLIPIZIDE ER 10 MG PO TB24: 10.0000 mg | ORAL_TABLET | Freq: Every morning | ORAL | 0 refills | Status: DC

## 2023-07-17 NOTE — Progress Notes (Signed)
 Patient requesting renewal of montelukast , glipizide , oxybutynin  and propranolol  prescriptions. Note latest prescriptions from 2/19 indicated office visit needed for further refill. Patient seen by PCP on 3/12. Next Office Visit scheduled for 09/09/2023  Would you please send renewals for patient to [facility]?  Thank you!  Joanna Bishop

## 2023-07-17 NOTE — Progress Notes (Unsigned)
 07/17/2023 Name: Joanna Bishop MRN: [id number] DOB: 07-18-1955  Chief Complaint  Patient presents with   Medication Management    KADIN BERA is a 68 y.o. year old female who presented for a telephone visit.   They were referred to the pharmacist by their PCP for assistance in managing diabetes.        Subjective:   Care Team: Primary Care Provider: Tasia Farr, FNP ; Next Scheduled Visit: 09/09/2023  Social Worker: [place], [place], [place] Dietitian: Kelvin Pattee, RD; Next Scheduled Visit: 07/29/2023    Medication Access/Adherence  Current Pharmacy:  [facility] [address] ([address]), [place] - [address] [place] ([place]) [place] [address] Phone: [phone] Fax: [phone]  [facility] - [place], [place] - [address] [facility] [address] [place] [address] Phone: [phone] Fax: [phone]   Patient reports affordability concerns with their medications: No  Patient reports access/transportation concerns to their pharmacy: No  Patient reports adherence concerns with their medications:  No     Note patient currently uses both [facility] mail order pharmacy and [facility]   Uses weekly pillbox for AM and PM medicine and Andree Bane App for daily reminders on her phone   Diabetes:   Note patient scheduled for appointment with dietitian now on 07/29/2023   Current medications:  - Trulicity  4.5 mg weekly on Mondays - glipizide  ER 10 mg daily every morning - Lantus  36 units daily   Medications tried in the past: metformin  (GI side effects)   Reports restarted using Freestyle Libre 3 continuous    Date of Download: 07/17/23 % Time CGM is active: 96% Average Glucose: 237 mg/dL Glucose Management Indicator: 9.0%  Glucose Variability: 24.1% (goal <36%) Time in Goal:  - Time in range 70-180: 16% - Time above range: 84% - Time below range: 0%     Reports has been working on improving dietary  habits recently, but having difficulty with appetite control. Reports has noticed an increase in weight, but denies weighing herself at home   Current dietary habits: Breakfast: scrambled eggs with tortilla or piece of toast  Lunch: chicken salad on crackers or tortilla Supper: hot dog with half of bun or chicken or beef with veggies (salad) or potato (limiting portion) or Ramen Drinks: water or diet Dr. Kathlene Paradise or diet Gingerale  Snacks: apple with peanut butter, canned citrus salad in natural juice, fruit snacks   Patient denies hypoglycemic s/sx including dizziness, shakiness, sweating - Patient carries glucose tablets    Current physical activity: walking limited by knee pain   Statin: rosuvastatin  5 mg daily     Asthma:   Current medications: - Symbicort  80-4.5 mcg inhaler - 2 puffs twice daily - albuterol  inhaler 1-2 puffs every 4-6 hours as needed for wheezing and/or shortness of breath - montelukast  10 mg at bedtime   Reports has noticed breathing worse recently with increased weight, but attributes to spending more time outdoors - tries to avoid triggers (going outside heat, overexertion, allergens in Spring and Fall)     Hypertension:   Current medications:  - losartan  100 mg daily - propranolol  ER 60 mg daily (takes for migraine prevention)     Patient has a home upper arm home BP cuff Recalls last checked last week, reading ~133/80   Current physical activity: walking limited by knee pain   Objective:  Lab Results  Component Value Date   HGBA1C 8.7 (H) 05/08/2023  Lab Results  Component Value Date   CREATININE 1.28 (H) 05/08/2023   BUN 23 05/08/2023   NA 138 05/08/2023   K 4.4 05/08/2023   CL 101 05/08/2023   CO2 22 05/08/2023    Lab Results  Component Value Date   CHOL 133 05/08/2023   HDL 43 05/08/2023   LDLCALC 46 05/08/2023   TRIG 289 (H) 05/08/2023   CHOLHDL 3.1 05/08/2023   BP Readings from Last 3 Encounters:  06/20/23 (!) 141/64   05/08/23 112/80  03/12/23 137/86   Pulse Readings from Last 3 Encounters:  06/20/23 (!) 103  05/08/23 (!) 102  03/12/23 92     Medications Reviewed Today     Reviewed by Ardis Becton, RPH-CPP (Pharmacist) on 07/17/23 at 1031  Med List Status: <None>   Medication Order Taking? Sig Documenting Provider Last Dose Status Informant  acetaminophen  (TYLENOL ) 650 MG CR tablet [id number]  Take 650 mg by mouth every 8 (eight) hours as needed for pain. [provider]  Active   albuterol  (VENTOLIN  HFA) 108 (90 Base) MCG/ACT inhaler [id number]  INHALE 1-2 PUFFS BY MOUTH into THE lungs EVERY 4 TO 6 HOURS AS NEEDED FOR WHEEZING AND/OR SHORTNESS OF BREATH Trenton Frock, PA-C  Active   blood glucose meter kit and supplies [id number]  Dispense based on patient and insurance preference. Use daily as directed. 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 Bacigalupo, Angela M, MD  Active   budesonide -formoterol  (SYMBICORT ) 80-4.5 MCG/ACT inhaler [id number]  Inhale 2 puffs into the lungs 2 (two) times daily. Simmons-Robinson, Makiera, MD  Active   Continuous Glucose Sensor (FREESTYLE LIBRE 3 PLUS SENSOR) [place] [id number]  Place 1 sensor on the skin every 15 days. Use to check glucose continuously Clifton, Kellie A, FNP  Active   Dulaglutide  (TRULICITY ) 4.5 MG/0.5ML [place] [id number] Yes Inject 4.5 mg into the skin once a week. Normie Becton, FNP Taking Active   glipiZIDE  (GLUCOTROL  XL) 10 MG 24 hr tablet [id number] Yes Take 1 tablet (10 mg total) by mouth every morning. Tasia Farr, FNP Taking Active   glucose blood (ONETOUCH VERIO) test strip [id number]  Use to check blood sugars once daily as instructed Bacigalupo, Angela M, MD  Active   insulin glargine  (LANTUS  SOLOSTAR) 100 UNIT/ML Solostar Pen [id number] Yes Inject 36 Units into the skin daily. Tasia Farr, FNP Taking Active   Insulin Pen Needle (BD PEN NEEDLE NANO U/F) 32G X 4 MM MISC [id number]  Use to inject insulin daily as directed Drubel, Heidi Llamas, PA-C   Active   ipratropium (ATROVENT ) 0.03 % nasal spray [id number]  Place 2 sprays into both nostrils every 12 (twelve) hours. Reena Canning, NP  Active   losartan  (COZAAR ) 100 MG tablet [id number] Yes Take 1 tablet (100 mg total) by mouth daily. Tasia Farr, FNP Taking Active   montelukast  (SINGULAIR ) 10 MG tablet [id number]  Take 1 tablet (10 mg total) by mouth at bedtime. Clifton, Kellie A, FNP  Active   Multiple Vitamin (MULTIVITAMIN WITH MINERALS) TABS tablet [id number]  Take 1 tablet by mouth daily. Centrum Silver Women's 50+ [provider]  Active   NON FORMULARY [id number]  Take 1 capsule by mouth in the morning and at bedtime. AZO Bladder Control Supplement [provider]  Active   OneTouch Delica Lancets 33G MISC [id number]  Use to check blood sugars once daily as directed. Bacigalupo, Angela M, MD  Active   oxybutynin  (DITROPAN -XL) 10 MG 24 hr tablet [id number]  Take 1 tablet (10 mg  total) by mouth at bedtime. Tasia Farr, FNP  Active   propranolol  ER (INDERAL  LA) 60 MG 24 hr capsule [id number] Yes Take 1 capsule (60 mg total) by mouth daily. Tasia Farr, FNP Taking Active   rosuvastatin  (CRESTOR ) 5 MG tablet [id number]  Take 1 tablet (5 mg total) by mouth daily. Tasia Farr, FNP  Active               Assessment/Plan:   Diabetes: - Have reviewed long term cardiovascular and renal outcomes of uncontrolled blood sugar - Have reviewed goal A1c, goal fasting, and goal 2 hour post prandial glucose - Reviewed dietary modifications including Encourage patient to have well-balanced meals and snacks spread throughout the day, while controlling carbohydrate portion sizes Discuss balanced snack ideas Encourage patient to increase consumption of non-starchy vegetables Encourage to review nutrition labels for carbohydrate content of foods - Will collaborate with providers covering for PCP regarding diabetes management options for patient  Contact [facility].  Speak with Bridgette to confirm [facility] on formulary  - Recommend to continue to use Freestyle Libre CGM to monitor blood sugar/as feedback on dietary choices Recommend to check glucose with fingerstick check when needed for symptoms and as back up to CGM. Patient to contact office if needed for readings outside of established parameters or symptoms     Hypertension: - Have encouraged patient to continue to take positional changes slowly - Recommend to monitor home blood pressure, keep log of results and have this record to review at upcoming medical appointments. Patient to contact provider office sooner if needed for readings outside of established parameters or symptoms   Asthma: - Patient to continue to take medications consistently - Patient to work on avoiding triggers and to contact office if needed for increased albuterol  use/symptoms    Follow Up Plan: Clinical Pharmacist will follow up with patient by telephone within the next 7 days    Arthur Lash, PharmD, [facility] [phone]

## 2023-07-24 ENCOUNTER — Other Ambulatory Visit: Payer: Self-pay | Admitting: Pharmacist

## 2023-07-24 DIAGNOSIS — N1831 Chronic kidney disease, stage 3a: Secondary | ICD-10-CM

## 2023-07-24 MED ORDER — TIRZEPATIDE 5 MG/0.5ML ~~LOC~~ SOAJ: 5.0000 mg | SUBCUTANEOUS | 0 refills | Status: DC

## 2023-07-24 NOTE — Progress Notes (Signed)
   07/24/2023  Patient ID: Joanna Bishop, female   DOB: [birth year], 68 y.o.   MRN: [id number]  Collaborated with Dr. David Escort (covering for PCP) to recommend switching patient from Trulicity  to Mounjaro 5 mg weekly (available through [facility]) for greater impact on blood sugar and appetite control   Provider agrees and sends prescription for [facility] sent to [facility] for patient  Follow up with patient today to provide update. Patient verbalizes understanding. Reports has 2 doses of Trulicity  remaining. Will follow up with [facility] and plan to start Mounjaro 5 mg weekly 1 week after takes last dose of Trulicity .  - Recommend to continue to use Freestyle Libre CGM to monitor blood sugar/as feedback on dietary choices Recommend to check glucose with fingerstick check when needed for symptoms and as back up to CGM. Patient to contact office if needed for readings outside of established parameters or symptoms  Follow Up Plan: Clinical Pharmacist will follow up with patient by telephone on 08/21/2023 at 10:30 AM    Arthur Lash, PharmD, [facility] [phone]

## 2023-07-24 NOTE — Patient Instructions (Addendum)
 Goals Addressed             This Visit's Progress    Pharmacy Goals       Our goal A1c is less than 7%. This corresponds with fasting sugars less than 130 and 2 hour after meal sugars less than 180. Please keep a log of your results when checking your blood sugar   Our goal bad cholesterol, or LDL, is less than 70 . This is why it is important to continue taking your rosuvastatin.  Check your blood pressure twice weekly, and any time you have concerning symptoms like headache, chest pain, dizziness, shortness of breath, or vision changes.   Our goal is less than 130/80.  To appropriately check your blood pressure, make sure you do the following:  1) Avoid caffeine, exercise, or tobacco products for 30 minutes before checking. Empty your bladder. 2) Sit with your back supported in a flat-backed chair. Rest your arm on something flat (arm of the chair, table, etc). 3) Sit still with your feet flat on the floor, resting, for at least 5 minutes.  4) Check your blood pressure. Take 1-2 readings.  5) Write down these readings and bring with you to any provider appointments.  Bring your home blood pressure machine with you to a provider's office for accuracy comparison at least once a year.   Make sure you take your blood pressure medications before you come to any office visit, even if you were asked to fast for labs.   Estelle Grumbles, PharmD, [facility] [phone]

## 2023-07-24 NOTE — Patient Instructions (Signed)
 Goals Addressed             This Visit's Progress    Pharmacy Goals       Our goal A1c is less than 7%. This corresponds with fasting sugars less than 130 and 2 hour after meal sugars less than 180. Please keep a log of your results when checking your blood sugar   Our goal bad cholesterol, or LDL, is less than 70 . This is why it is important to continue taking your rosuvastatin.  Check your blood pressure twice weekly, and any time you have concerning symptoms like headache, chest pain, dizziness, shortness of breath, or vision changes.   Our goal is less than 130/80.  To appropriately check your blood pressure, make sure you do the following:  1) Avoid caffeine, exercise, or tobacco products for 30 minutes before checking. Empty your bladder. 2) Sit with your back supported in a flat-backed chair. Rest your arm on something flat (arm of the chair, table, etc). 3) Sit still with your feet flat on the floor, resting, for at least 5 minutes.  4) Check your blood pressure. Take 1-2 readings.  5) Write down these readings and bring with you to any provider appointments.  Bring your home blood pressure machine with you to a provider's office for accuracy comparison at least once a year.   Make sure you take your blood pressure medications before you come to any office visit, even if you were asked to fast for labs.   Estelle Grumbles, PharmD, [facility] [phone]

## 2023-07-29 ENCOUNTER — Encounter: Attending: Family Medicine | Admitting: Dietician

## 2023-07-29 ENCOUNTER — Encounter: Payer: Self-pay | Admitting: Dietician

## 2023-07-29 VITALS — Ht 64.0 in | Wt 250.1 lb

## 2023-07-29 DIAGNOSIS — E1122 Type 2 diabetes mellitus with diabetic chronic kidney disease: Secondary | ICD-10-CM | POA: Diagnosis not present

## 2023-07-29 DIAGNOSIS — Z6841 Body Mass Index (BMI) 40.0 and over, adult: Secondary | ICD-10-CM | POA: Insufficient documentation

## 2023-07-29 DIAGNOSIS — Z713 Dietary counseling and surveillance: Secondary | ICD-10-CM | POA: Insufficient documentation

## 2023-07-29 DIAGNOSIS — N1831 Chronic kidney disease, stage 3a: Secondary | ICD-10-CM | POA: Diagnosis not present

## 2023-07-29 NOTE — Progress Notes (Signed)
 Diabetes Self-Management Education  Visit Type: First/Initial  Appt. Start Time: 1355 Appt. End Time: 1510  07/29/2023  [name], identified by name and date of birth, is a 68 y.o. female with a diagnosis of Diabetes: Type 2.   ASSESSMENT  Height 5\' 4"  (1.626 m), weight 250 lb 1.6 oz (113.4 kg). Body mass index is 42.93 kg/m.   Diabetes Self-Management Education - 07/29/23 1356       Visit Information   Visit Type First/Initial      Initial Visit   Diabetes Type Type 2    Are you currently following a meal plan? No    Are you taking your medications as prescribed? Yes      Health Coping   How would you rate your overall health? Fair      Psychosocial Assessment   Patient Belief/Attitude about Diabetes Denial    What is the hardest part about your diabetes right now, causing you the most concern, or is the most worrisome to you about your diabetes?   Making healty food and beverage choices;Being active    Self-care barriers None    Self-management support Family    Other persons present Family Member   granddaughter   Special Needs None    Preferred Learning Style Hands on    Learning Readiness Change in progress    How often do you need to have someone help you when you read instructions, pamphlets, or other written materials from your doctor or pharmacy? 3 - Sometimes    What is the last grade level you completed in school? 12      Pre-Education Assessment   Patient understands the diabetes disease and treatment process. Needs Instruction    Patient understands incorporating nutritional management into lifestyle. Needs Instruction    Patient undertands incorporating physical activity into lifestyle. Needs Review    Patient understands using medications safely. Comprehends key points    Patient understands monitoring blood glucose, interpreting and using results Comprehends key points    Patient understands prevention, detection, and treatment of acute  complications. Needs Review    Patient understands prevention, detection, and treatment of chronic complications. Needs Instruction    Patient understands how to develop strategies to address psychosocial issues. Needs Instruction    Patient understands how to develop strategies to promote health/change behavior. Comprehends key points      Complications   Last HgB A1C per patient/outside source 8.7 %   05/08/23   How often do you check your blood sugar? 3-4 times/day   freestyle Libre 3+   Fasting Blood glucose range (mg/dL) [phone];914-782   [phone]   Postprandial Blood glucose range (mg/dL) 865-784;696-295;>284   130s - >200   Number of hypoglycemic episodes per month 1    Can you tell when your blood sugar is low? No   had BG of 53 without symptoms, early am after long hours without eating per patient   Number of hyperglycemic episodes ( >200mg /dL): Occasional    Have you had a dilated eye exam in the past 12 months? Yes    Have you had a dental exam in the past 12 months? No    Are you checking your feet? Yes    How many days per week are you checking your feet? 7      Dietary Intake   Breakfast oatmeal and milk/ English muffin/ sometimes grapefruit only    Snack (morning) none    Lunch chicken salad/ imitation crab meat in  tortilla wrap with lettuce and tomato/ soup    Snack (afternoon) apple slices/ grapes/ blackberries/ cheese squares/ snack cake (eats 1/2)    Advertising account planner beef/ occ pork + 1-2 veg + potato/ bread -- limits starches    Snack (evening) same as pm -- often small pouch fruit gummy snack    Beverage(s) diet Dr Kathlene Paradise, water, 1 glass cranberry juice daily, milk, ginger ale sometimes at night (keeps mouth from drying out)      Activity / Exercise   Activity / Exercise Type ADL's   difficult due to leg/ knee issues; plans to try chair aerobics     Patient Education   Previous Diabetes Education No    Disease Pathophysiology Definition of diabetes, type 1 and 2, and  the diagnosis of diabetes;Factors that contribute to the development of diabetes;Explored patient's options for treatment of their diabetes    Healthy Eating Role of diet in the treatment of diabetes and the relationship between the three main macronutrients and blood glucose level;Plate Method;Meal timing in regards to the patients' current diabetes medication.    Being Active Role of exercise on diabetes management, blood pressure control and cardiac health.    Monitoring Taught/discussed recording of test results and interpretation of SMBG.;Purpose and frequency of SMBG.;Identified appropriate SMBG and/or A1C goals.    Acute complications Taught prevention, symptoms, and  treatment of hypoglycemia - the 15 rule.;Discussed and identified patients' prevention, symptoms, and treatment of hyperglycemia.    Chronic complications Relationship between chronic complications and blood glucose control;Lipid levels, blood glucose control and heart disease    Diabetes Stress and Support Role of stress on diabetes      Individualized Goals (developed by patient)   Nutrition General guidelines for healthy choices and portions discussed      Outcomes   Expected Outcomes Demonstrated interest in learning. Expect positive outcomes    Future DMSE 4-6 wks             Individualized Plan for Diabetes Self-Management Training:   Learning Objective:  Patient will have a greater understanding of diabetes self-management. Patient education plan is to attend individual and/or group sessions per assessed needs and concerns.   Plan:   Patient Instructions  Continue to check blood sugar 3-4 times each day Control carbs to 2-3 servings, or 30-45 grams, with each meal. Include a protein food + vegetables with meals.  Try chair exercise to increase physical activity.  Expected Outcomes:  Demonstrated interest in learning. Expect positive outcomes  Education material provided: ADA - How to Thrive: A Guide for  Your Journey with Diabetes; Plate planner with food lists  If problems or questions, patient to contact team via:  Phone  Future DSME appointment: 4-6 wks Class 2 09/12/22

## 2023-07-29 NOTE — Patient Instructions (Signed)
 Continue to check blood sugar 3-4 times each day Control carbs to 2-3 servings, or 30-45 grams, with each meal. Include a protein food + vegetables with meals.  Try chair exercise to increase physical activity.

## 2023-08-21 ENCOUNTER — Other Ambulatory Visit: Payer: Self-pay | Admitting: Pharmacist

## 2023-08-21 DIAGNOSIS — I152 Hypertension secondary to endocrine disorders: Secondary | ICD-10-CM

## 2023-08-21 DIAGNOSIS — N1831 Chronic kidney disease, stage 3a: Secondary | ICD-10-CM

## 2023-08-21 DIAGNOSIS — J4541 Moderate persistent asthma with (acute) exacerbation: Secondary | ICD-10-CM

## 2023-08-21 NOTE — Progress Notes (Signed)
 08/21/2023 Name: Joanna Bishop MRN: [id number] DOB: [birth year]  Chief Complaint  Patient presents with   Medication Management    Joanna Bishop is a 68 y.o. year old female who presented for a telephone visit.   They were referred to the pharmacist by their PCP for assistance in managing diabetes.        Subjective:   Care Team: Primary Care Provider: Wellington Curtis LABOR, FNP ; Next Scheduled Visit: 09/09/2023  Social Worker: [place], [place], [place] Dietitian: Gail Sharlet KIDD, RD; Next Scheduled Visit: 09/12/2023  Medication Access/Adherence  Current Pharmacy:  [facility] [address] ([address]), [place] - [address] ([address]) [place] [address] Phone: [phone] Fax: [phone]  [facility] - [place], [place] - [address] [facility] [address] [place] [address] Phone: [phone] Fax: [phone]   Patient reports affordability concerns with their medications: No  Patient reports access/transportation concerns to their pharmacy: No  Patient reports adherence concerns with their medications:  No     Note patient currently uses both [facility] mail order pharmacy and [facility]   Uses weekly pillbox for AM and PM medicine and [place] App for daily reminders on her phone   Diabetes:   Patient working with [facility] dietitian   Current medications:  - glipizide  ER 10 mg daily every morning - Lantus  36 units daily - Mounjaro  5 mg weekly on Mondays (started 6/16)  Reports had nausea with first dose of Mounjaro , but symptoms now improving   Medications tried in the past: metformin  (GI side effects), Trulicity  (stopped with start of Mounjaro )   Reports restarted using Freestyle Libre 3 Plus continuous    Date of Download: 08/21/23 % Time CGM is active: 95% Average Glucose: 218 mg/dL Glucose Management Indicator: 8.5%  Glucose Variability: 24.7% (goal <36%) Time in Goal:  - Time in range 70-180:  26% - Time above range: 74% - Time below range: 0%   Reports has noticed appetite control benefit with switch to Mounjaro .    Reports has been working on improving dietary habits as discussed with dietitian    Patient denies hypoglycemic s/sx including dizziness, shakiness, sweating - Patient carries glucose tablets    Current physical activity: walking limited by knee pain   Statin: rosuvastatin  5 mg daily     Asthma:   Current medications: - Symbicort  80-4.5 mcg inhaler - 2 puffs twice daily - albuterol  inhaler 1-2 puffs every 4-6 hours as needed for wheezing and/or shortness of breath - montelukast  10 mg at bedtime   Reports has noticed breathing worse recently with increased weight, but attributes to spending more time outdoors - tries to avoid triggers (going outside heat, overexertion, allergens in Spring and Fall)     Hypertension:   Current medications:  - losartan  100 mg daily - propranolol  ER 60 mg daily (takes for migraine prevention)    Patient has a home upper arm home BP cuff. Denies checking home BP recently  Denies symptoms of hypotension   Current physical activity: walking limited by knee pain   Objective:  Lab Results  Component Value Date   HGBA1C 8.7 (H) 05/08/2023    Lab Results  Component Value Date   CREATININE 1.28 (H) 05/08/2023   BUN 23 05/08/2023   NA 138 05/08/2023   K 4.4 05/08/2023   CL 101 05/08/2023   CO2 22 05/08/2023    Lab Results  Component Value Date   CHOL 133 05/08/2023  HDL 43 05/08/2023   LDLCALC 46 05/08/2023   TRIG 289 (H) 05/08/2023   CHOLHDL 3.1 05/08/2023   BP Readings from Last 3 Encounters:  06/20/23 (!) 141/64  05/08/23 112/80  03/12/23 137/86   Pulse Readings from Last 3 Encounters:  06/20/23 (!) 103  05/08/23 (!) 102  03/12/23 92     Medications Reviewed Today     Reviewed by Alana Sharyle LABOR, RPH-CPP (Pharmacist) on 08/21/23 at 1043  Med List Status: <None>   Medication Order  Taking? Sig Documenting Provider Last Dose Status Informant  acetaminophen  (TYLENOL ) 650 MG CR tablet [id number]  Take 650 mg by mouth every 8 (eight) hours as needed for pain. [provider]  Active   albuterol  (VENTOLIN  HFA) 108 (90 Base) MCG/ACT inhaler [id number] Yes INHALE 1-2 PUFFS BY MOUTH into THE lungs EVERY 4 TO 6 HOURS AS NEEDED FOR WHEEZING AND/OR SHORTNESS OF BREATH Cyndi Shaver, PA-C  Active   blood glucose meter kit and supplies [id number]  Dispense based on patient and insurance preference. Use daily as directed. 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 Bacigalupo, Angela M, MD  Active   budesonide -formoterol  (SYMBICORT ) 80-4.5 MCG/ACT inhaler [id number] Yes Inhale 2 puffs into the lungs 2 (two) times daily. Simmons-Robinson, Makiera, MD  Active   Continuous Glucose Sensor (FREESTYLE LIBRE 3 PLUS SENSOR) [place] [id number]  Place 1 sensor on the skin every 15 days. Use to check glucose continuously Wellington Curtis LABOR, FNP  Active     Discontinued 08/21/23 1037 (Change in therapy)   glipiZIDE  (GLUCOTROL  XL) 10 MG 24 hr tablet [id number] Yes Take 1 tablet (10 mg total) by mouth every morning. Simmons-Robinson, Makiera, MD  Active   glucose blood (ONETOUCH VERIO) test strip [id number]  Use to check blood sugars once daily as instructed Bacigalupo, Angela M, MD  Active   insulin glargine  (LANTUS  SOLOSTAR) 100 UNIT/ML Solostar Pen [id number] Yes Inject 36 Units into the skin daily. Wellington Curtis LABOR, FNP  Active   Insulin Pen Needle (BD PEN NEEDLE NANO U/F) 32G X 4 MM MISC [id number]  Use to inject insulin daily as directed Drubel, Shaver, PA-C  Active   ipratropium (ATROVENT ) 0.03 % nasal spray [id number]  Place 2 sprays into both nostrils every 12 (twelve) hours. Teresa Shelba SAUNDERS, NP  Active   losartan  (COZAAR ) 100 MG tablet [id number] Yes Take 1 tablet (100 mg total) by mouth daily. Wellington Curtis LABOR, FNP  Active   montelukast  (SINGULAIR ) 10 MG tablet [id number]  Take 1 tablet (10 mg total) by mouth at  bedtime. Simmons-Robinson, Makiera, MD  Active   Multiple Vitamin (MULTIVITAMIN WITH MINERALS) TABS tablet [id number]  Take 1 tablet by mouth daily. Centrum Silver Women's 50+ [provider]  Active   NON FORMULARY [id number]  Take 1 capsule by mouth in the morning and at bedtime. AZO Bladder Control Supplement [provider]  Active   OneTouch Delica Lancets 33G MISC [id number]  Use to check blood sugars once daily as directed. Bacigalupo, Angela M, MD  Active   oxybutynin  (DITROPAN -XL) 10 MG 24 hr tablet [id number]  Take 1 tablet (10 mg total) by mouth at bedtime. Simmons-Robinson, Makiera, MD  Active   propranolol  ER (INDERAL  LA) 60 MG 24 hr capsule [id number] Yes Take 1 capsule (60 mg total) by mouth daily. Simmons-Robinson, Makiera, MD  Active   rosuvastatin  (CRESTOR ) 5 MG tablet [id number] Yes Take 1 tablet (5 mg total) by mouth daily. Wellington Curtis LABOR, FNP  Active   tirzepatide  (MOUNJARO ) 5 MG/0.5ML Pen [id number]  Yes Inject 5 mg into the skin once a week. Myrla Jon HERO, MD  Active               Assessment/Plan:   Diabetes: - Have reviewed long term cardiovascular and renal outcomes of uncontrolled blood sugar - Have reviewed goal A1c, goal fasting, and goal 2 hour post prandial glucose - Reviewed dietary modifications including Encourage patient to have well-balanced meals and snacks spread throughout the day, while controlling carbohydrate portion sizes - Discuss strategies to aid with tolerability to Mounjaro . Patient to contact office if symptoms of nausea not continuing to improve/has new symtpoms - Recommend to continue to use Freestyle Libre CGM to monitor blood sugar/as feedback on dietary choices Recommend to check glucose with fingerstick check when needed for symptoms and as back up to CGM. Patient to contact office if needed for readings outside of established parameters or symptoms     Hypertension: - Have encouraged patient to continue to take  positional changes slowly - Recommend to monitor home blood pressure, keep log of results and have this record to review at upcoming medical appointments. Patient to contact provider office sooner if needed for readings outside of established parameters or symptoms   Asthma: - Patient to continue to take medications consistently - Patient to continue to avoid triggers and to contact office if needed for increased albuterol  use/symptoms    Follow Up Plan: Clinical Pharmacist will follow up with patient by telephone on 10/09/2023 at 3:00 PM     Sharyle Sia, PharmD, [facility] [phone]

## 2023-08-21 NOTE — Patient Instructions (Signed)
 Goals Addressed             This Visit's Progress    Pharmacy Goals       Our goal A1c is less than 7%. This corresponds with fasting sugars less than 130 and 2 hour after meal sugars less than 180. Please keep a log of your results when checking your blood sugar   Our goal bad cholesterol, or LDL, is less than 70 . This is why it is important to continue taking your rosuvastatin.  Check your blood pressure twice weekly, and any time you have concerning symptoms like headache, chest pain, dizziness, shortness of breath, or vision changes.   Our goal is less than 130/80.  To appropriately check your blood pressure, make sure you do the following:  1) Avoid caffeine, exercise, or tobacco products for 30 minutes before checking. Empty your bladder. 2) Sit with your back supported in a flat-backed chair. Rest your arm on something flat (arm of the chair, table, etc). 3) Sit still with your feet flat on the floor, resting, for at least 5 minutes.  4) Check your blood pressure. Take 1-2 readings.  5) Write down these readings and bring with you to any provider appointments.  Bring your home blood pressure machine with you to a provider's office for accuracy comparison at least once a year.   Make sure you take your blood pressure medications before you come to any office visit, even if you were asked to fast for labs.   Estelle Grumbles, PharmD, [facility] [phone]

## 2023-09-09 ENCOUNTER — Encounter: Payer: Self-pay | Admitting: Family Medicine

## 2023-09-09 ENCOUNTER — Ambulatory Visit: Admitting: Family Medicine

## 2023-09-09 ENCOUNTER — Ambulatory Visit: Payer: Self-pay | Admitting: Family Medicine

## 2023-09-09 VITALS — BP 129/89 | HR 101 | Temp 97.7°F | Ht 64.0 in | Wt 252.7 lb

## 2023-09-09 DIAGNOSIS — Z7984 Long term (current) use of oral hypoglycemic drugs: Secondary | ICD-10-CM | POA: Diagnosis not present

## 2023-09-09 DIAGNOSIS — E1159 Type 2 diabetes mellitus with other circulatory complications: Secondary | ICD-10-CM

## 2023-09-09 DIAGNOSIS — I152 Hypertension secondary to endocrine disorders: Secondary | ICD-10-CM

## 2023-09-09 DIAGNOSIS — N1831 Chronic kidney disease, stage 3a: Secondary | ICD-10-CM | POA: Diagnosis not present

## 2023-09-09 DIAGNOSIS — H539 Unspecified visual disturbance: Secondary | ICD-10-CM | POA: Diagnosis not present

## 2023-09-09 DIAGNOSIS — Z1211 Encounter for screening for malignant neoplasm of colon: Secondary | ICD-10-CM

## 2023-09-09 DIAGNOSIS — N3946 Mixed incontinence: Secondary | ICD-10-CM

## 2023-09-09 DIAGNOSIS — E1122 Type 2 diabetes mellitus with diabetic chronic kidney disease: Secondary | ICD-10-CM

## 2023-09-09 DIAGNOSIS — G44209 Tension-type headache, unspecified, not intractable: Secondary | ICD-10-CM | POA: Diagnosis not present

## 2023-09-09 DIAGNOSIS — Z1231 Encounter for screening mammogram for malignant neoplasm of breast: Secondary | ICD-10-CM

## 2023-09-09 DIAGNOSIS — Z23 Encounter for immunization: Secondary | ICD-10-CM | POA: Diagnosis not present

## 2023-09-09 DIAGNOSIS — N3281 Overactive bladder: Secondary | ICD-10-CM

## 2023-09-09 DIAGNOSIS — Z794 Long term (current) use of insulin: Secondary | ICD-10-CM

## 2023-09-09 DIAGNOSIS — G629 Polyneuropathy, unspecified: Secondary | ICD-10-CM

## 2023-09-09 LAB — POCT GLYCOSYLATED HEMOGLOBIN (HGB A1C): Hemoglobin A1C: 7.4 % — AB (ref 4.0–5.6)

## 2023-09-09 MED ORDER — GABAPENTIN 100 MG PO CAPS: 100.0000 mg | ORAL_CAPSULE | Freq: Three times a day (TID) | ORAL | 3 refills | Status: DC

## 2023-09-09 MED ORDER — MOUNJARO 7.5 MG/0.5ML ~~LOC~~ SOAJ: 7.5000 mg | SUBCUTANEOUS | 3 refills | Status: DC

## 2023-09-09 NOTE — Progress Notes (Signed)
 Established Patient Office Visit  Introduced to nurse practitioner role and practice setting.  All questions answered.  Discussed provider/patient relationship and expectations.  Subjective   Patient ID: Joanna Bishop, female    DOB: 02-28-1955  Age: 68 y.o. MRN: [id number]  Chief Complaint  Patient presents with   Medical Management of Chronic Issues    DM2, HTN   Urinary Incontinence    Patient feels as if it is worsening, says Joanna Bishop would like to see about changing medication dose of oxybutynin    Anxiety    Gad score 12   Depression    PHQ9 score 14   Discussed the use of AI scribe software for clinical note transcription with the patient, who gave verbal consent to proceed.  Numb/tingling feet L side of face - lasted couple times  [facility] L orthopedic = painful getting checked Extremi Headache = different than a migraine Feeling of something crawling on extremity - know its not there Only sleep about 2 - 4 hours, incontinence usually is what start the sleep awake  History of Present Illness Joanna Bishop is a 68 year old female who presents for chronic disease mgmt.    Joanna Bishop experiences worsening urinary incontinence, with episodes occurring several times a day. Joanna Bishop feels dry upon waking but becomes 'sopping wet' as soon as Joanna Bishop stands up. Movement, such as getting up from a chair, triggers incontinence. There is an associated odor and occasional burning, but Joanna Bishop has not had a urinary tract infection recently, which previously required hospitalization.  Joanna Bishop has a history of diabetes and is currently on Mounjaro , glipizide , and Lantus . Her A1c has improved from 8.7 to 7.4 over the past month. Joanna Bishop experienced nausea for the first few days after starting Mounjaro , but it subsided. Her blood glucose levels have been decreasing, with recent averages of 197 mg/dL, down from 778 mg/dL.  Joanna Bishop was experiencing severe leg cramps that wake her up at night, rendering her unable to  stand. Drinking half a sugar-free Gatorade before bed has helped alleviate the cramps.  Joanna Bishop reports numbness and burning sensations in her feet, particularly in the mornings, with super sensitivity upon touch. Joanna Bishop also describes intermittent soreness on the left side of her face, akin to an ingrown pimple, which resolved after a few days.  Joanna Bishop experiences daily headaches, described as a tight sensation around her head, lasting one to two hours after waking and recurring throughout the day. Joanna Bishop has tried changing her sleep routine to alleviate them. Joanna Bishop reports it is hard to tell if there are vision changes due to recent cataract surgery, but Joanna Bishop denies hearing changes or dizziness.  Joanna Bishop reports feeling extremely tired, with racing visions when her eyes are closed, and sensations of something crawling on her skin. Joanna Bishop sleeps in shifts, two to four hours at a time, often interrupted by the need to urinate.  Joanna Bishop has a history of asthma and uses albuterol  and Singulair . Asthma worst over summer d/t heat  States fell due to tripping in March, bumped head and wrist - no residual injuries.        09/09/2023   10:09 AM 07/29/2023    1:44 PM 06/14/2023   10:33 AM  Depression screen PHQ 2/9  Decreased Interest 2 0 1  Down, Depressed, Hopeless 1 0 1  PHQ - 2 Score 3 0 2  Altered sleeping 3  1  Tired, decreased energy 3  1  Change in appetite 2  0  Feeling  bad or failure about yourself  1  0  Trouble concentrating 2  1  Moving slowly or fidgety/restless 0  0  Suicidal thoughts 0  0  PHQ-9 Score 14  5  Difficult doing work/chores Very difficult  Somewhat difficult       09/09/2023   10:09 AM 05/08/2023   10:26 AM 10/18/2022   10:43 AM  GAD 7 : Generalized Anxiety Score  Nervous, Anxious, on Edge 2 3 2   Control/stop worrying 1 3 2   Worry too much - different things 1 3 2   Trouble relaxing 2 3 2   Restless 2 3 2   Easily annoyed or irritable 2 3 2   Afraid - awful might happen 2 3 2   Total GAD 7  Score 12 21 14   Anxiety Difficulty  Very difficult      Review of Systems  All other systems reviewed and are negative.   Negative unless indicated in HPI   Objective:     BP 129/89 (BP Location: Left Arm, Patient Position: Sitting, Cuff Size: Normal)   Pulse (!) 101   Temp 97.7 F (36.5 C) (Oral)   Ht 5' 4 (1.626 m)   Wt 252 lb 11.2 oz (114.6 kg)   SpO2 97%   BMI 43.38 kg/m    Physical Exam Constitutional:      General: Joanna Bishop is not in acute distress.    Appearance: Normal appearance. Joanna Bishop is obese. Joanna Bishop is not ill-appearing, toxic-appearing or diaphoretic.  HENT:     Head: Normocephalic.     Nose: Nose normal.     Mouth/Throat:     Mouth: Mucous membranes are moist.     Pharynx: Oropharynx is clear.  Eyes:     Extraocular Movements: Extraocular movements intact.     Pupils: Pupils are equal, round, and reactive to light.  Cardiovascular:     Rate and Rhythm: Normal rate and regular rhythm.     Pulses: Normal pulses.     Heart sounds: Normal heart sounds. No murmur heard.    No friction rub. No gallop.  Pulmonary:     Effort: No respiratory distress.     Breath sounds: No stridor. No wheezing, rhonchi or rales.  Chest:     Chest wall: No tenderness.  Musculoskeletal:     Right lower leg: No edema.     Left lower leg: No edema.  Skin:    General: Skin is warm and dry.     Capillary Refill: Capillary refill takes less than 2 seconds.  Neurological:     General: No focal deficit present.     Mental Status: Joanna Bishop is alert and oriented to person, place, and time. Mental status is at baseline.     Cranial Nerves: No cranial nerve deficit.     Sensory: No sensory deficit.     Motor: No weakness.     Gait: Gait normal.  Psychiatric:        Attention and Perception: Attention and perception normal.        Mood and Affect: Mood normal.        Speech: Speech normal.        Behavior: Behavior normal.        Thought Content: Thought content normal.        Cognition  and Memory: Cognition and memory normal.        Judgment: Judgment normal.      Results for orders placed or performed in visit on 09/09/23  POCT HgB A1C  Result Value Ref Range   Hemoglobin A1C 7.4 (A) 4.0 - 5.6 %   HbA1c POC (<> result, manual entry)     HbA1c, POC (prediabetic range)     HbA1c, POC (controlled diabetic range)        The 10-year ASCVD risk score (Arnett DK, et al., 2019) is: 14.2%    Assessment & Plan:  Type 2 diabetes mellitus with stage 3a chronic kidney disease, without long-term current use of insulin (HCC) -     POCT glycosylated hemoglobin (Hb A1C) -     Mounjaro ; Inject 7.5 mg into the skin once a week.  Dispense: 2 mL; Refill: 3  Encounter for screening mammogram for breast cancer -     3D Screening Mammogram, Left and Right  Immunization due -     Pneumococcal conjugate vaccine 20-valent  Screening for colon cancer -     Ambulatory referral to Gastroenterology  OAB (overactive bladder) -     Ambulatory referral to Urogynecology  Neuropathy -     Gabapentin ; Take 1 capsule (100 mg total) by mouth 3 (three) times daily.  Dispense: 90 capsule; Refill: 3  Mixed stress and urge urinary incontinence  Tension-type headache, not intractable, unspecified chronicity pattern -     Ambulatory referral to Neurology  Hypertension associated with diabetes (HCC)  Vision disturbance -     Ambulatory referral to Neurology     Assessment and Plan Assessment & Plan Urinary Incontinence & urgency Worsening urinary incontinence stress and urgency related, has to wear adult briefs. No recent UTIs.  On oxybutynin  - not improving  -Discussed referral to urogynecologist and potential treatments including pelvic floor therapy and pessary use. - Refer to urogynecologist for evaluation and management. - Discuss pelvic floor therapy as a treatment option.  Type 2 Diabetes Mellitus A1c improved from 8.7% to 7.4% with Mounjaro . Initial nausea subsided.  Discussed increasing dose to 7.5 mg for better glycemic control. On glipizide  and Lantus . - Increase Mounjaro  to 7.5 mg after current dose. - Continue glipizide  and Lantus  36 units nightly. - Follow up with A1c testing in three months. - Continue multidisciplinary approach including nutritionist appointments. - Continue with pharmacy for medication assistance - Continue freestyle libre for glucose monitoring  Peripheral Neuropathy Numbness, burning, and hypersensitivity in feet likely due to diabetic neuropathy. Discussed gabapentin  for symptom management. - Prescribe gabapentin  100 mg three times a day, starting with one dose at night to assess tolerance.  Asthma Using albuterol  and Singulair . - Continue current asthma management with albuterol  and Singulair .  Tension Headache Headaches likely tension-related due to stress. Band-like, but new to pt. - lasts 1-2hrs, few times per week - hx of migraines - described these as different, will have visions after looking at screen too long - increased family stress - on propanolol for migraines - advise on avoid triggers - headache journal - Advise on hydration and use of Tylenol  or ibuprofen as needed. - Given age, comorbidities, and new type of headache referral to neuro.  General Health Maintenance Due for screening colonoscopy and mammogram. s. - Refer for screening colonoscopy. - Refer for mammogram at [facility].  HTN GOAL<130/80 Elevated today - increased home stress Continue losartan  100mg   Follow-up Scheduled follow-up for diabetes and other health concerns. - Follow up in three months for A1c recheck. - Ensure follow-up with orthopedic appointment on July 30 for knee evaluation.    Return in about 3 months (around 12/10/2023) for a1c check.    Zelene Barga A  Wellington, FNP

## 2023-09-12 ENCOUNTER — Ambulatory Visit

## 2023-09-19 ENCOUNTER — Ambulatory Visit

## 2023-09-25 DIAGNOSIS — M1612 Unilateral primary osteoarthritis, left hip: Secondary | ICD-10-CM | POA: Diagnosis not present

## 2023-09-25 DIAGNOSIS — S7222XS Displaced subtrochanteric fracture of left femur, sequela: Secondary | ICD-10-CM | POA: Diagnosis not present

## 2023-09-27 ENCOUNTER — Encounter: Payer: Self-pay | Admitting: Neurology

## 2023-10-09 ENCOUNTER — Other Ambulatory Visit: Payer: Self-pay | Admitting: Pharmacist

## 2023-10-09 ENCOUNTER — Other Ambulatory Visit (INDEPENDENT_AMBULATORY_CARE_PROVIDER_SITE_OTHER): Payer: Self-pay

## 2023-10-09 DIAGNOSIS — E1122 Type 2 diabetes mellitus with diabetic chronic kidney disease: Secondary | ICD-10-CM

## 2023-10-09 DIAGNOSIS — N1831 Chronic kidney disease, stage 3a: Secondary | ICD-10-CM

## 2023-10-09 DIAGNOSIS — Z794 Long term (current) use of insulin: Secondary | ICD-10-CM

## 2023-10-09 DIAGNOSIS — Z7985 Long-term (current) use of injectable non-insulin antidiabetic drugs: Secondary | ICD-10-CM

## 2023-10-09 DIAGNOSIS — Z7984 Long term (current) use of oral hypoglycemic drugs: Secondary | ICD-10-CM

## 2023-10-09 MED ORDER — TIRZEPATIDE 10 MG/0.5ML ~~LOC~~ SOAJ: 10.0000 mg | SUBCUTANEOUS | 1 refills | Status: DC

## 2023-10-09 NOTE — Progress Notes (Signed)
 S:     Chief Complaint  Patient presents with   Diabetes    Reason for visit: ?  Joanna Bishop is a 68 y.o. female with a history of diabetes (type 2), who presents today for an initial diabetes pharmacotherapy visit.? Pertinent PMH also includes HTN, migraines, OAB, insomnia, anxiety, and PUD.  Known DM Complications: nephropathy, peripheral neuropathy   Care Team: Primary Care Provider: Wellington Curtis LABOR, FNP  At last visit on 09/09/22 with PCP, gabapentin  was initiated for neuropathy. Additionally, Mounjaro  dose was increased from 5 mg to 7.5 mg weekly.  Today, patient endorses a noticeable improvement in both nerve pain and blood sugar control.  Current diabetes medications include: Lantus  36 units daily, glipizide  ER 10 mg daily, Mounjaro  7.5 mg weekly  Previous diabetes medications include: metformin  (GI upset) Current hypertension medications include: losartan  100 mg daily, propranolol  ER 60 mg daily  Current hyperlipidemia medications include: rosuvastatin  5 mg daily  Patient reports adherence to taking all medications as prescribed. Endorses using a pill box and phone alarms.   Have you been experiencing any side effects to the medications prescribed? no Insurance coverage: [facility]  Patient reports hypoglycemic events. Confirmed treatment with 3-4 glucose tabs or 1/2 cup of juice  Patient reports nocturia (nighttime urination), however, this has improved.  Patient reports neuropathy (nerve pain), but states this has improved with use of gabapentin  Patient denies visual changes. Patient reports self foot exams.   Patient reported dietary habits: Eats 3 meals/day Breakfast: eggs and milk, bowl of cereal Lunch: salad or sandwich Dinner: protein, vegetables Drinks: sugar-free flavored water, Diet Dr. Nunzio *reports granddaughter is very strict on limiting carbohydrate intake  Patient-reported exercise habits: limited d/t leg pain following knee replacement/femur  fracture, just started chair aerobics  DM Prevention:  Statin: Taking Last UACR on 05/08/23 = 52 mg/g Last eye exam: 01/08/23 Lab Results  Component Value Date   HMDIABEYEEXA No Retinopathy 01/08/2023   Last foot exam: No foot exam found Tobacco Use:  Tobacco Use: Low Risk  (09/25/2023)   [facility]   Patient History    Smoking Tobacco Use: Never    Smokeless Tobacco Use: Never    Passive Exposure: Not on file   O:      Observed patterns: some overnight hypoglycemia observed and random hypoglycemia  Vitals:  Wt Readings from Last 3 Encounters:  09/09/23 252 lb 11.2 oz (114.6 kg)  07/29/23 250 lb 1.6 oz (113.4 kg)  05/08/23 248 lb 12.8 oz (112.9 kg)   BP Readings from Last 3 Encounters:  09/09/23 129/89  06/20/23 (!) 141/64  05/08/23 112/80   Pulse Readings from Last 3 Encounters:  09/09/23 (!) 101  06/20/23 (!) 103  05/08/23 (!) 102     Labs:?  Lab Results  Component Value Date   HGBA1C 7.4 (A) 09/09/2023   HGBA1C 8.7 (H) 05/08/2023   HGBA1C 6.8 (A) 10/18/2022   GLUCOSE 306 (H) 05/08/2023   MICRALBCREAT 52 (H) 05/08/2023   MICRALBCREAT 37 (H) 10/18/2022   MICRALBCREAT 187 (H) 05/15/2022   CREATININE 1.28 (H) 05/08/2023   CREATININE 1.17 (H) 09/06/2021   CREATININE 1.09 (H) 05/02/2021    Lab Results  Component Value Date   CHOL 133 05/08/2023   LDLCALC 46 05/08/2023   LDLCALC 49 09/06/2021   LDLCALC 82 01/26/2021   HDL 43 05/08/2023   TRIG 289 (H) 05/08/2023   TRIG 288 (H) 09/06/2021   TRIG 267 (H) 01/26/2021  ALT 52 (H) 05/08/2023   ALT 25 09/06/2021   AST 39 05/08/2023   AST 21 09/06/2021      Chemistry      Component Value Date/Time   NA 138 05/08/2023 1115   K 4.4 05/08/2023 1115   CL 101 05/08/2023 1115   CO2 22 05/08/2023 1115   BUN 23 05/08/2023 1115   CREATININE 1.28 (H) 05/08/2023 1115      Component Value Date/Time   CALCIUM  9.5 05/08/2023 1115   ALKPHOS 105 05/08/2023 1115   AST 39 05/08/2023 1115    ALT 52 (H) 05/08/2023 1115   BILITOT 0.6 05/08/2023 1115       The 10-year ASCVD risk score (Arnett DK, et al., 2019) is: 14.2%  Lab Results  Component Value Date   MICRALBCREAT 52 (H) 05/08/2023   MICRALBCREAT 37 (H) 10/18/2022   MICRALBCREAT 187 (H) 05/15/2022   MICRALBCREAT 249 (H) 09/06/2021   MICRALBCREAT 108.9 (H) 12/20/2017    A/P: Diabetes longstanding close to goal of <7-7.5% based on age.  Patient is able to verbalize appropriate hypoglycemia management plan. Medication adherence appears appropriate. CGM report shows some concern for random/overnight hypoglycemia, as well as daytime hyperglycemia. Recommend discontinuation of SFU agent given length of therapy and tolerance over time and concurrent hypoglycemia observed. Additionally, recommend increased GLP1/GIP dose given daytime hyperglycemia. -Continued basal insulin Lantus  (insulin glargine ) 36 units daily  - INCREASED dose of GLP-1/GIP Mounjaro  (tirzepatide ) from 7.5 mg to 10 mg - DISCONTINUED glipizide  ER 10 mg daily -Extensively discussed pathophysiology of diabetes, recommended lifestyle interventions, dietary effects on blood sugar control.  -Counseled on s/sx of and management of hypoglycemia.  -Next A1c anticipated 11/2023   ASCVD risk - primary prevention in patient with diabetes. Last LDL is 46 at goal of <70 mg/dL. Moderate intensity statin indicated.  -Continued rosuvastatin  5 mg daily.   Written patient instructions provided. Patient verbalized understanding of treatment plan.  Total time in telephone counseling 30 minutes.    Follow-up:  Pharmacist 10/31/23 PCP clinic visit in 12/11/23  Peyton CHARLENA Ferries, PharmD Clinical Pharmacist [facility] [phone]

## 2023-10-10 ENCOUNTER — Other Ambulatory Visit: Payer: Self-pay | Admitting: Family Medicine

## 2023-10-10 DIAGNOSIS — J301 Allergic rhinitis due to pollen: Secondary | ICD-10-CM

## 2023-10-10 DIAGNOSIS — G43109 Migraine with aura, not intractable, without status migrainosus: Secondary | ICD-10-CM

## 2023-10-10 DIAGNOSIS — N3281 Overactive bladder: Secondary | ICD-10-CM

## 2023-10-17 ENCOUNTER — Other Ambulatory Visit: Payer: Self-pay | Admitting: Family Medicine

## 2023-10-17 DIAGNOSIS — E1169 Type 2 diabetes mellitus with other specified complication: Secondary | ICD-10-CM

## 2023-10-17 DIAGNOSIS — N1831 Chronic kidney disease, stage 3a: Secondary | ICD-10-CM

## 2023-10-17 DIAGNOSIS — G43109 Migraine with aura, not intractable, without status migrainosus: Secondary | ICD-10-CM

## 2023-10-17 DIAGNOSIS — J301 Allergic rhinitis due to pollen: Secondary | ICD-10-CM

## 2023-10-17 DIAGNOSIS — E1122 Type 2 diabetes mellitus with diabetic chronic kidney disease: Secondary | ICD-10-CM

## 2023-10-17 DIAGNOSIS — E1159 Type 2 diabetes mellitus with other circulatory complications: Secondary | ICD-10-CM

## 2023-10-17 NOTE — Telephone Encounter (Signed)
 Copied from CRM [id number]. Topic: General - Other >> Oct 16, 2023  4:19 PM Turkey B wrote: Reason for CRM: Patient called in says wants these meds to go to [facility] from now on,montelukast  (SINGULAIR ) 10 MG tablet propranolol  ER (INDERAL  LA) 60 MG 24 hr /rosuvastatin  (CRESTOR ) 5 MG tablet

## 2023-10-18 MED ORDER — PROPRANOLOL HCL ER 60 MG PO CP24
60.0000 mg | ORAL_CAPSULE | Freq: Every day | ORAL | 0 refills | Status: DC
Start: 2023-10-18 — End: 2024-01-15

## 2023-10-18 MED ORDER — MONTELUKAST SODIUM 10 MG PO TABS: 10.0000 mg | ORAL_TABLET | Freq: Every day | ORAL | 0 refills | Status: DC

## 2023-10-18 MED ORDER — ROSUVASTATIN CALCIUM 5 MG PO TABS
5.0000 mg | ORAL_TABLET | Freq: Every day | ORAL | 0 refills | Status: DC
Start: 2023-10-18 — End: 2024-01-15

## 2023-10-18 NOTE — Addendum Note (Signed)
 Addended by: Caulin Begley on: 10/18/2023 01:14 PM   Modules accepted: Orders

## 2023-10-31 ENCOUNTER — Ambulatory Visit

## 2023-11-06 ENCOUNTER — Ambulatory Visit

## 2023-11-11 NOTE — Progress Notes (Deleted)
 NEUROLOGY CONSULTATION NOTE  Joanna Bishop MRN: [id number] DOB: 1956-01-17  Referring provider: Curtis Boom, FNP Primary care provider: Curtis Boom, FNP  Reason for consult:  headache, vision changes  Assessment/Plan:   ***   Subjective:  Joanna Bishop is a 68 year old ***-handed female with HTN, DM 2 with neuropathy, CKD, anxiety who presents for headache and vision changes.  History supplemented by referring provider's note.  ***  She has had cataract surgery ***.  She has diabetes.  Hgb A1c from 09/09/2023 was 7.4%.   Past NSAIDS/analgesics:  Excedrin Migraine, Fioricet , naproxen Past abortive triptans:  *** Past abortive ergotamine:  *** Past muscle relaxants:  baclofen , Flexeril  Past anti-emetic:  *** Past antihypertensive medications:  *** Past antidepressant medications:  Sertraline , Wellbutrin , citalopram  Past anticonvulsant medications:  divalproex  Past anti-CGRP:  *** Past vitamins/Herbal/Supplements:  *** Past antihistamines/decongestants:  *** Other past therapies:  ***  Current NSAIDS/analgesics:  Tylenol  Current triptans:  none Current ergotamine:  none Current anti-emetic:  none Current muscle relaxants:  none Current Antihypertensive medications:  propranolol  ER 60mg  daily, losartan  Current Antidepressant medications:  none Current Anticonvulsant medications:  gabapentin  100mg  TID (neuropathy) Current anti-CGRP:  none   Caffeine :  *** Alcohol:  *** Smoker:  *** Diet:  *** Exercise:  *** Depression:  ***; Anxiety:  *** Other pain:  *** Sleep hygiene:  ***  History of TBI/concussion:  *** Family history of headache:  *** Family history of cerebral aneurysm:  ***      PAST MEDICAL HISTORY: Past Medical History:  Diagnosis Date   Allergy 02/26/1958   Anxiety    Arthritis    shoulders, legs   Asthma    Blood transfusion without reported diagnosis 05/05/2021   Depression    Hyperlipidemia    Migraine headache    2x/month    Morbid obesity with BMI of 40.0-44.9, adult (HCC)    Osteoporosis 11/27/2010   Type 2 diabetes mellitus with stage 3a chronic kidney disease (HCC)    Vertigo     PAST SURGICAL HISTORY: Past Surgical History:  Procedure Laterality Date   ABDOMINAL HYSTERECTOMY     ARTHROPLASTY     CATARACT EXTRACTION W/PHACO Left 02/21/2023   Procedure: CATARACT EXTRACTION PHACO AND INTRAOCULAR LENS PLACEMENT (IOC) LEFT DIABETIC 7.98 00:49.7;  Surgeon: Enola Feliciano Hugger, MD;  Location: [facility];  Service: Ophthalmology;  Laterality: Left;   CATARACT EXTRACTION W/PHACO Right 03/07/2023   Procedure: CATARACT EXTRACTION PHACO AND INTRAOCULAR LENS PLACEMENT (IOC) RIGHT DIABETIC;  Surgeon: Enola Feliciano Hugger, MD;  Location: [facility];  Service: Ophthalmology;  Laterality: Right;  5.73 0:39.9   CHOLECYSTECTOMY     FEMUR FRACTURE SURGERY     JOINT REPLACEMENT  12/16/2010   TOTAL KNEE ARTHROPLASTY Left    TUBAL LIGATION  10/08/1986    MEDICATIONS: Current Outpatient Medications on File Prior to Visit  Medication Sig Dispense Refill   acetaminophen  (TYLENOL ) 650 MG CR tablet Take 650 mg by mouth every 8 (eight) hours as needed for pain.     albuterol  (VENTOLIN  HFA) 108 (90 Base) MCG/ACT inhaler INHALE 1-2 PUFFS BY MOUTH into THE lungs EVERY 4 TO 6 HOURS AS NEEDED FOR WHEEZING AND/OR SHORTNESS OF BREATH 18 g 4   blood glucose meter kit and supplies Dispense based on patient and insurance preference. Use daily as directed. 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 1 each 0   budesonide -formoterol  (SYMBICORT ) 80-4.5 MCG/ACT inhaler Inhale 2 puffs into the lungs 2 (two) times daily. 3 each 3  Continuous Glucose Sensor (FREESTYLE LIBRE 3 PLUS SENSOR) MISC Place 1 sensor on the skin every 15 days. Use to check glucose continuously 2 each 12   gabapentin  (NEURONTIN ) 100 MG capsule Take 1 capsule (100 mg total) by mouth 3 (three) times daily. 90 capsule 3   glucose blood (ONETOUCH VERIO) test strip Use to check  blood sugars once daily as instructed 100 each 12   insulin glargine  (LANTUS  SOLOSTAR) 100 UNIT/ML Solostar Pen Inject 36 Units into the skin daily. 33 mL 4   Insulin Pen Needle (BD PEN NEEDLE NANO U/F) 32G X 4 MM MISC Use to inject insulin daily as directed 100 each 0   ipratropium (ATROVENT ) 0.03 % nasal spray Place 2 sprays into both nostrils every 12 (twelve) hours. 30 mL 12   losartan  (COZAAR ) 100 MG tablet Take 1 tablet by mouth once daily 90 tablet 0   montelukast  (SINGULAIR ) 10 MG tablet Take 1 tablet (10 mg total) by mouth at bedtime. 90 tablet 0   Multiple Vitamin (MULTIVITAMIN WITH MINERALS) TABS tablet Take 1 tablet by mouth daily. Centrum Silver Women's 50+     NON FORMULARY Take 1 capsule by mouth in the morning and at bedtime. AZO Bladder Control Supplement     OneTouch Delica Lancets 33G MISC Use to check blood sugars once daily as directed. 100 each 12   oxybutynin  (DITROPAN -XL) 10 MG 24 hr tablet TAKE 1 TABLET BY MOUTH AT BEDTIME 90 tablet 0   propranolol  ER (INDERAL  LA) 60 MG 24 hr capsule Take 1 capsule (60 mg total) by mouth daily. 90 capsule 0   rosuvastatin  (CRESTOR ) 5 MG tablet Take 1 tablet (5 mg total) by mouth daily. 90 tablet 0   tirzepatide  (MOUNJARO ) 10 MG/0.5ML Pen Inject 10 mg into the skin once a week. 2 mL 1   No current facility-administered medications on file prior to visit.    ALLERGIES: Allergies  Allergen Reactions   Penicillins Swelling, Anaphylaxis and Other (See Comments)    FAMILY HISTORY: Family History  Problem Relation Age of Onset   Diabetes Mother    COPD Mother    Diabetes Father    Coronary artery disease Father    Coronary artery disease Maternal Grandmother    Heart attack Maternal Grandmother    Diabetes Paternal Grandmother     Objective:  *** General: No acute distress.  Patient appears well-groomed.   Head:  Normocephalic/atraumatic Eyes:  fundi examined but not visualized Neck: supple, no paraspinal tenderness, full  range of motion Heart: regular rate and rhythm Neurological Exam: Mental status: alert and oriented to person, place, and time, speech fluent and not dysarthric, language intact. Cranial nerves: CN I: not tested CN II: pupils equal, round and reactive to light, visual fields intact CN III, IV, VI:  full range of motion, no nystagmus, no ptosis CN V: facial sensation intact. CN VII: upper and lower face symmetric CN VIII: hearing intact CN IX, X: gag intact, uvula midline CN XI: sternocleidomastoid and trapezius muscles intact CN XII: tongue midline Bulk & Tone: normal, no fasciculations. Motor:  muscle strength 5/5 throughout Sensation:  Pinprick and vibratory sensation intact. Deep Tendon Reflexes:  2+ throughout,  toes downgoing.   Finger to nose testing:  Without dysmetria.   Gait:  Normal station and stride.  Romberg negative.    Thank you for allowing me to take part in the care of this patient.  Juliene Dunnings, DO  CC: ***

## 2023-11-12 ENCOUNTER — Other Ambulatory Visit: Payer: Self-pay | Admitting: Family Medicine

## 2023-11-12 ENCOUNTER — Encounter: Payer: Self-pay | Admitting: Neurology

## 2023-11-12 ENCOUNTER — Ambulatory Visit: Admitting: Neurology

## 2023-11-12 ENCOUNTER — Telehealth: Payer: Self-pay | Admitting: Family Medicine

## 2023-11-12 DIAGNOSIS — N1831 Type 2 diabetes mellitus with diabetic chronic kidney disease: Secondary | ICD-10-CM

## 2023-11-12 MED ORDER — FREESTYLE LIBRE 3 PLUS SENSOR MISC: 12 refills | Status: AC

## 2023-11-12 NOTE — Telephone Encounter (Signed)
 Copied from CRM [id number]. Topic: Appointments - Scheduling Inquiry for Clinic >> Nov 12, 2023  8:50 AM Everette C wrote: Reason for CRM: The patient would like to be contacted to reschedule their recently cancelled appointment with the practice pharmacy staff. Please contact when possible

## 2023-11-13 ENCOUNTER — Ambulatory Visit

## 2023-11-28 ENCOUNTER — Other Ambulatory Visit (INDEPENDENT_AMBULATORY_CARE_PROVIDER_SITE_OTHER)

## 2023-11-28 DIAGNOSIS — E1122 Type 2 diabetes mellitus with diabetic chronic kidney disease: Secondary | ICD-10-CM

## 2023-11-28 DIAGNOSIS — Z7984 Long term (current) use of oral hypoglycemic drugs: Secondary | ICD-10-CM

## 2023-11-28 DIAGNOSIS — Z7985 Long-term (current) use of injectable non-insulin antidiabetic drugs: Secondary | ICD-10-CM

## 2023-11-28 DIAGNOSIS — Z794 Long term (current) use of insulin: Secondary | ICD-10-CM

## 2023-11-28 DIAGNOSIS — N1831 Chronic kidney disease, stage 3a: Secondary | ICD-10-CM

## 2023-11-28 MED ORDER — MOUNJARO 12.5 MG/0.5ML ~~LOC~~ SOAJ: 12.5000 mg | SUBCUTANEOUS | 1 refills | Status: DC

## 2023-11-28 MED ORDER — GLIPIZIDE ER 5 MG PO TB24: 5.0000 mg | ORAL_TABLET | Freq: Every day | ORAL | 2 refills | Status: DC

## 2023-11-28 NOTE — Progress Notes (Signed)
 S:     Chief Complaint  Patient presents with   Diabetes    Reason for visit: ?  Joanna Bishop is a 68 y.o. female with a history of diabetes (type 2), who presents today for an initial diabetes pharmacotherapy visit.? Pertinent PMH also includes HTN, migraines, OAB, insomnia, anxiety, and PUD.  Known DM Complications: nephropathy, peripheral neuropathy   Care Team: Primary Care Provider: Wellington Curtis LABOR, FNP  At last visit on 10/09/23 with clinical pharmacist, patient was experincing some overnight and daytime hypoglycemia. Mounjaro  was increased to 10 mg weekly and glipizide  was discontinued at that time.  Today, patient reports stopping glipizide  for a day or two, but restarting it d/t some hyperglycemia. She also denies any issues with the increase of Mounjaro . She just received her next refill of Mounjaro  10 mg.   Today, patient endorses a noticeable improvement in both nerve pain and blood sugar control.  Current diabetes medications include: Lantus  36 units daily, glipizide  ER 10 mg daily, Mounjaro  10 mg weekly (Monday) Previous diabetes medications include: metformin  (GI upset) Current hypertension medications include: losartan  100 mg daily, propranolol  ER 60 mg daily  Current hyperlipidemia medications include: rosuvastatin  5 mg daily  Patient reports adherence to taking all medications as prescribed. Endorses using a pill box and phone alarms.   Have you been experiencing any side effects to the medications prescribed? no Insurance coverage: [facility]  Patient reports hypoglycemic events. Confirmed treatment with 3-4 glucose tabs or 1/2 cup of juice  Patient reports nocturia (nighttime urination), however, this has improved.  Patient reports neuropathy (nerve pain), but states this has improved with use of gabapentin  Patient denies visual changes. Patient reports self foot exams.   Patient reported dietary habits: Eats 3 meals/day Breakfast: eggs and milk, bowl  of cereal Lunch: salad or sandwich Dinner: protein, vegetables Drinks: sugar-free flavored water, Diet Dr. Nunzio *reports granddaughter is very strict on limiting carbohydrate intake  Patient-reported exercise habits: limited d/t leg pain following knee replacement/femur fracture, just started chair aerobics  DM Prevention:  Statin: Taking Last UACR on 05/08/23 = 52 mg/g Last eye exam: 01/08/23 Lab Results  Component Value Date   HMDIABEYEEXA No Retinopathy 01/08/2023   Last foot exam: No foot exam found Tobacco Use:  Tobacco Use: Low Risk  (09/25/2023)   Received from [facility]   Patient History    Smoking Tobacco Use: Never    Smokeless Tobacco Use: Never    Passive Exposure: Not on file   O:  LibreView Report:      Vitals:  Wt Readings from Last 3 Encounters:  09/09/23 252 lb 11.2 oz (114.6 kg)  07/29/23 250 lb 1.6 oz (113.4 kg)  05/08/23 248 lb 12.8 oz (112.9 kg)   BP Readings from Last 3 Encounters:  09/09/23 129/89  06/20/23 (!) 141/64  05/08/23 112/80   Pulse Readings from Last 3 Encounters:  09/09/23 (!) 101  06/20/23 (!) 103  05/08/23 (!) 102     Labs:?  Lab Results  Component Value Date   HGBA1C 7.4 (A) 09/09/2023   HGBA1C 8.7 (H) 05/08/2023   HGBA1C 6.8 (A) 10/18/2022   GLUCOSE 306 (H) 05/08/2023   MICRALBCREAT 52 (H) 05/08/2023   MICRALBCREAT 37 (H) 10/18/2022   MICRALBCREAT 187 (H) 05/15/2022   CREATININE 1.28 (H) 05/08/2023   CREATININE 1.17 (H) 09/06/2021   CREATININE 1.09 (H) 05/02/2021    Lab Results  Component Value Date   CHOL 133 05/08/2023   LDLCALC  46 05/08/2023   LDLCALC 49 09/06/2021   LDLCALC 82 01/26/2021   HDL 43 05/08/2023   TRIG 289 (H) 05/08/2023   TRIG 288 (H) 09/06/2021   TRIG 267 (H) 01/26/2021   ALT 52 (H) 05/08/2023   ALT 25 09/06/2021   AST 39 05/08/2023   AST 21 09/06/2021      Chemistry      Component Value Date/Time   NA 138 05/08/2023 1115   K 4.4 05/08/2023 1115   CL 101 05/08/2023 1115    CO2 22 05/08/2023 1115   BUN 23 05/08/2023 1115   CREATININE 1.28 (H) 05/08/2023 1115      Component Value Date/Time   CALCIUM  9.5 05/08/2023 1115   ALKPHOS 105 05/08/2023 1115   AST 39 05/08/2023 1115   ALT 52 (H) 05/08/2023 1115   BILITOT 0.6 05/08/2023 1115       The 10-year ASCVD risk score (Arnett DK, et al., 2019) is: 14.2%  Lab Results  Component Value Date   MICRALBCREAT 52 (H) 05/08/2023   MICRALBCREAT 37 (H) 10/18/2022   MICRALBCREAT 187 (H) 05/15/2022   MICRALBCREAT 249 (H) 09/06/2021   MICRALBCREAT 108.9 (H) 12/20/2017    A/P: Diabetes longstanding close to goal of <7-7.5% based on age.  Patient is able to verbalize appropriate hypoglycemia management plan. Medication adherence appears appropriate. CGM report significantly improved with time in range of 74%, up from 59% at last visit. She continues to have some daytime hypoglycemia around 4-6 pm prior to dinner. Patient self-restarted glipizide  after last visit when BG began to rise. Will decrease dose today and consider discontinuation at follow up. Patient just received her next refill of Mounjaro  10 mg. Will increase dose at next fill.  -Continued basal insulin Lantus  (insulin glargine ) 36 units daily  - Increased dose of GLP-1/GIP Mounjaro  (tirzepatide ) from 10 mg to 12.5 mg weekly starting 12/30/23 -Decreased glipizide  from 10 mg to 5 mg daily  -Extensively discussed pathophysiology of diabetes, recommended lifestyle interventions, dietary effects on blood sugar control.  -Counseled on s/sx of and management of hypoglycemia.  -Next A1c anticipated 11/2023   ASCVD risk - primary prevention in patient with diabetes. Last LDL is 46 at goal of <70 mg/dL. Moderate intensity statin indicated.  -Continued rosuvastatin  5 mg daily.   Written patient instructions provided. Patient verbalized understanding of treatment plan. Total time counseling 15 minutes.   Follow-up:  Pharmacist 01/15/24 PCP clinic visit in  12/11/23  Peyton CHARLENA Ferries, PharmD Clinical Pharmacist [facility] [phone]

## 2023-12-11 ENCOUNTER — Ambulatory Visit: Admitting: Family Medicine

## 2023-12-11 ENCOUNTER — Ambulatory Visit: Payer: Self-pay | Admitting: Family Medicine

## 2023-12-11 ENCOUNTER — Encounter: Payer: Self-pay | Admitting: Family Medicine

## 2023-12-11 VITALS — BP 137/80 | HR 90 | Ht 64.0 in | Wt 256.3 lb

## 2023-12-11 DIAGNOSIS — N1831 Chronic kidney disease, stage 3a: Secondary | ICD-10-CM

## 2023-12-11 DIAGNOSIS — Z23 Encounter for immunization: Secondary | ICD-10-CM

## 2023-12-11 DIAGNOSIS — Z794 Long term (current) use of insulin: Secondary | ICD-10-CM

## 2023-12-11 DIAGNOSIS — E1122 Type 2 diabetes mellitus with diabetic chronic kidney disease: Secondary | ICD-10-CM | POA: Diagnosis not present

## 2023-12-11 DIAGNOSIS — I152 Hypertension secondary to endocrine disorders: Secondary | ICD-10-CM | POA: Diagnosis not present

## 2023-12-11 DIAGNOSIS — Z1231 Encounter for screening mammogram for malignant neoplasm of breast: Secondary | ICD-10-CM

## 2023-12-11 DIAGNOSIS — J4541 Moderate persistent asthma with (acute) exacerbation: Secondary | ICD-10-CM | POA: Diagnosis not present

## 2023-12-11 DIAGNOSIS — G43109 Migraine with aura, not intractable, without status migrainosus: Secondary | ICD-10-CM

## 2023-12-11 DIAGNOSIS — E1159 Type 2 diabetes mellitus with other circulatory complications: Secondary | ICD-10-CM | POA: Diagnosis not present

## 2023-12-11 DIAGNOSIS — G629 Polyneuropathy, unspecified: Secondary | ICD-10-CM

## 2023-12-11 DIAGNOSIS — E1169 Type 2 diabetes mellitus with other specified complication: Secondary | ICD-10-CM | POA: Diagnosis not present

## 2023-12-11 DIAGNOSIS — E785 Hyperlipidemia, unspecified: Secondary | ICD-10-CM | POA: Diagnosis not present

## 2023-12-11 DIAGNOSIS — N3281 Overactive bladder: Secondary | ICD-10-CM | POA: Diagnosis not present

## 2023-12-11 LAB — POCT GLYCOSYLATED HEMOGLOBIN (HGB A1C): Hemoglobin A1C: 6.7 % — AB (ref 4.0–5.6)

## 2023-12-11 NOTE — Progress Notes (Addendum)
 Established Patient Visit  Introduced to nurse practitioner role and practice setting.  All questions answered.  Discussed provider/patient relationship and expectations.   Subjective    Patient ID: Joanna Bishop, female    DOB: 23-Feb-1956  Age: 68 y.o. MRN: [id number]  CC:  Chief Complaint  Patient presents with   Medical Management of Chronic Issues    Cologuard declined Mammogram ok to order, send to [facility] ([place]) Influenza ok to order   Diabetes    Patient reports avg readying from 100-150 mg/dL with occasional hypoglycemic episodes where she feels lightheaded. Her eye exam is coming up in November. She reports symptoms of excessive thirst, urinating and being fatigue    HPI  Discussed the use of AI scribe software for clinical note transcription with the patient, who gave verbal consent to proceed.  History of Present Illness Joanna Bishop is a 68 year old female with hx of  DMII, migraines, PUD, asthma, HLD, OAB, and Mood disorder, presents for diabetes management.  She has experienced more stable blood sugar levels, often in the 'green' range per CGM, but will have intermittent nocturnal hypoglycemia. Upon waking, her fasting blood sugars range between 150-170 mg/dL.  Her current medication regimen includes Mounjaro  10mg  weekly. She also takes Lantus  at 36 units and has reduced her glipizide  to 5 mg. An attempt to stop glipizide  resulted in blood sugars reaching 300 mg/dL, prompting her to resume it. Her A1c has improved to 6.7%. She is frustrated that her son lost weight on a similar medication regimen while she has gained weight.  She has a history of knee replacement 13 years ago and is experiencing leg pain, which she attributes to favoring her leg and affecting her hip. She is scheduled for an injection in two weeks.  She shares a family history of diabetes-related complications, including a cousin who recently had a foot amputation and an uncle and  great-grandmother who had leg amputations.    Flowsheet Row Office Visit from 12/11/2023 in [facility]  PHQ-9 Total Score 14      12/11/2023    9:58 AM 09/09/2023   10:09 AM 05/08/2023   10:26 AM 10/18/2022   10:43 AM  GAD 7 : Generalized Anxiety Score  Nervous, Anxious, on Edge 2 2 3 2   Control/stop worrying 1 1 3 2   Worry too much - different things 1 1 3 2   Trouble relaxing 1 2 3 2   Restless 1 2 3 2   Easily annoyed or irritable 1 2 3 2   Afraid - awful might happen 1 2 3 2   Total GAD 7 Score 8 12 21 14   Anxiety Difficulty Somewhat difficult  Very difficult      Outpatient Encounter Medications as of 12/11/2023  Medication Sig   acetaminophen  (TYLENOL ) 650 MG CR tablet Take 650 mg by mouth every 8 (eight) hours as needed for pain.   albuterol  (VENTOLIN  HFA) 108 (90 Base) MCG/ACT inhaler INHALE 1-2 PUFFS BY MOUTH into THE lungs EVERY 4 TO 6 HOURS AS NEEDED FOR WHEEZING AND/OR SHORTNESS OF BREATH   blood glucose meter kit and supplies Dispense based on patient and insurance preference. Use daily as directed. 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   budesonide -formoterol  (SYMBICORT ) 80-4.5 MCG/ACT inhaler Inhale 2 puffs into the lungs 2 (two) times daily.   Continuous Glucose Sensor (FREESTYLE LIBRE 3 PLUS SENSOR) MISC Place 1 sensor on the skin every 15 days. Use to check glucose continuously   gabapentin  (NEURONTIN ) 100  MG capsule Take 1 capsule (100 mg total) by mouth 3 (three) times daily.   glipiZIDE  (GLUCOTROL  XL) 5 MG 24 hr tablet Take 1 tablet (5 mg total) by mouth daily with breakfast.   glucose blood (ONETOUCH VERIO) test strip Use to check blood sugars once daily as instructed   insulin glargine  (LANTUS  SOLOSTAR) 100 UNIT/ML Solostar Pen Inject 36 Units into the skin daily.   Insulin Pen Needle (BD PEN NEEDLE NANO U/F) 32G X 4 MM MISC Use to inject insulin daily as directed   ipratropium (ATROVENT ) 0.03 % nasal spray Place 2 sprays into both nostrils every 12  (twelve) hours.   losartan  (COZAAR ) 100 MG tablet Take 1 tablet by mouth once daily   montelukast  (SINGULAIR ) 10 MG tablet Take 1 tablet (10 mg total) by mouth at bedtime.   Multiple Vitamin (MULTIVITAMIN WITH MINERALS) TABS tablet Take 1 tablet by mouth daily. Centrum Silver Women's 50+   NON FORMULARY Take 1 capsule by mouth in the morning and at bedtime. AZO Bladder Control Supplement   OneTouch Delica Lancets 33G MISC Use to check blood sugars once daily as directed.   oxybutynin  (DITROPAN -XL) 10 MG 24 hr tablet TAKE 1 TABLET BY MOUTH AT BEDTIME   propranolol  ER (INDERAL  LA) 60 MG 24 hr capsule Take 1 capsule (60 mg total) by mouth daily.   rosuvastatin  (CRESTOR ) 5 MG tablet Take 1 tablet (5 mg total) by mouth daily.   tirzepatide  (MOUNJARO ) 12.5 MG/0.5ML Pen Inject 12.5 mg into the skin once a week.   No facility-administered encounter medications on file as of 12/11/2023.    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)    Vertigo     Past Surgical History:  Procedure Laterality Date   ABDOMINAL HYSTERECTOMY     ARTHROPLASTY     CATARACT EXTRACTION W/PHACO Left 02/21/2023   Procedure: CATARACT EXTRACTION PHACO AND INTRAOCULAR LENS PLACEMENT (IOC) LEFT DIABETIC 7.98 00:49.7;  Surgeon: Enola Feliciano Hugger, MD;  Location[facility];  Service: Ophthalmology;  Laterality: Left;   CATARACT EXTRACTION W/PHACO Right 03/07/2023   Procedure: CATARACT EXTRACTION PHACO AND INTRAOCULAR LENS PLACEMENT (IOC) RIGHT DIABETIC;  Surgeon: Enola Feliciano Hugger, MD;  Location: [facility];  Service: Ophthalmology;  Laterality: Right;   5.73 0:39.9   CHOLECYSTECTOMY     FEMUR FRACTURE SURGERY     JOINT REPLACEMENT  12/16/2010   TOTAL KNEE ARTHROPLASTY Left    TUBAL LIGATION  10/08/1986    Family History  Problem Relation Age of Onset   Diabetes Mother    COPD Mother    Diabetes Father    Coronary artery disease Father    Coronary artery disease Maternal Grandmother    Heart attack Maternal Grandmother    Diabetes Paternal Grandmother     Social History   Socioeconomic History   Marital status: Widowed    Spouse name: Not on file   Number of children: 3   Years of education: Not on file   Highest education level: Some college, no degree  Occupational History   Occupation: disability  Tobacco  Use   Smoking status: Never   Smokeless tobacco: Never  Vaping Use   Vaping status: Never Used  Substance and Sexual Activity   Alcohol use: Not Currently    Comment: rarely <5 drinks per year   Drug use: Never   Sexual activity: Not Currently    Birth control/protection: Abstinence  Other Topics Concern   Not on file  Social History Narrative   Not on file   Social Drivers of Health   Financial Resource Strain: High Risk (10/30/2023)   Overall Financial Resource Strain (CARDIA)    Difficulty of Paying Living Expenses: Hard  Food Insecurity: Food Insecurity Present (10/30/2023)   Hunger Vital Sign    Worried About Running Out of Food in the Last Year: Sometimes true    Ran Out of Food in the Last Year: Sometimes true  Transportation Needs: Unmet Transportation Needs (10/30/2023)   PRAPARE - Transportation    Lack of Transportation (Medical): Yes    Lack of Transportation (Non-Medical): Yes  Physical Activity: Inactive (10/30/2023)   Exercise Vital Sign    Days of Exercise per Week: 0 days    Minutes of Exercise per Session: Not on file  Stress: Stress Concern Present (10/30/2023)   [employer] of Occupational Health - Occupational Stress Questionnaire    Feeling of Stress: Rather much  Social  Connections: Socially Isolated (10/30/2023)   Social Connection and Isolation Panel    Frequency of Communication with Friends and Family: Three times a week    Frequency of Social Gatherings with Friends and Family: Once a week    Attends Religious Services: Never    Database administrator or Organizations: No    Attends Engineer, structural: Not on file    Marital Status: Widowed  Intimate Partner Violence: Not At Risk (05/20/2023)   Humiliation, Afraid, Rape, and Kick questionnaire    Fear of Current or Ex-Partner: No    Emotionally Abused: No    Physically Abused: No    Sexually Abused: No    ROS      Objective    BP 137/80 (BP Location: Left Arm, Patient Position: Sitting, Cuff Size: Large)   Pulse 90   Ht 5' 4 (1.626 m)   Wt 256 lb 4.8 oz (116.3 kg)   SpO2 96%   BMI 43.99 kg/m   Physical Exam Constitutional:      General: She is not in acute distress.    Appearance: Normal appearance. She is obese. She is not ill-appearing, toxic-appearing or diaphoretic.  HENT:     Head: Normocephalic.     Nose: Nose normal.     Mouth/Throat:     Mouth: Mucous membranes are moist.     Pharynx: Oropharynx is clear.  Eyes:     Extraocular Movements: Extraocular movements intact.     Pupils: Pupils are equal, round, and reactive to light.  Neck:     Vascular: No carotid bruit.  Cardiovascular:     Rate and Rhythm: Normal rate and regular rhythm.     Pulses: Normal pulses.     Heart sounds: Normal heart sounds. No murmur heard.    No friction rub. No gallop.  Pulmonary:     Effort: No respiratory distress.     Breath sounds: No stridor. No wheezing, rhonchi or rales.  Chest:     Chest wall: No tenderness.  Musculoskeletal:     Right lower leg: No edema.     Left lower leg: No edema.  Lymphadenopathy:     Cervical: No cervical adenopathy.  Skin:    General: Skin is warm and dry.     Capillary Refill: Capillary refill takes less than 2 seconds.  Neurological:      General: No focal deficit present.     Mental Status: She is alert and oriented to person, place, and time. Mental status is at baseline.  Psychiatric:        Mood and Affect: Mood normal.        Behavior: Behavior normal.        Thought Content: Thought content normal.        Judgment: Judgment normal.         Assessment & Plan:  Assessment and Plan Assessment & Plan Type 2 diabetes mellitus, CKD Stage 3a  - POC today = 6.7, controlled  - She is on Mounjaro , glipizide , and Lantus .  - Recent adjustment to Mounjaro  dosage from 10 mg to 12.5 mg, with a plan to increase to 15 mg if tolerated after four weeks. - Glipizide  reduced to 5 mg with a plan to discontinue.  - Lantus  at 36 units, with consideration for future discontinuation.  - Reports fasting blood sugars between 150-170 mg/dL and occasional nocturnal hypoglycemia. Encouraged healthy snack before bed to limit hypoglycemic event, like apples with peanut butter - Work closely with pharmacist monthly - UTD on foot and eye exam - on statin and arb - Urine ACR UTD - next visit recommend CMP, Lipid panel check  Hyperlipidemia - chronic, controlled - continue rosuvastatin  5mg  daily  Migraines - chronic, controlled today - continue propanolol daily  HTN - chronic, borderline today, historically control - GOAL<130/80 - monitor at home - DASH diet, exercise - continue losartan  100mg  daily  Moderate persistent asthma - controlled - continue prn albuterol  - continue Symbicort  daily - continue montelukast   Overactive Bladder - continue oxybutynin   Neuropathy  - chronic, controlled at this time - continue gabapentin   General Health Maintenance - Administer flu vaccine today. - Order mammogram screening for breast cancer. - Hold off on colonoscopy referral until ready- declines today.   Type 2 diabetes mellitus with stage 3a chronic kidney disease, with long-term current use of insulin (HCC) -     POCT  glycosylated hemoglobin (Hb A1C)  Hyperlipidemia associated with type 2 diabetes mellitus (HCC)  Hypertension associated with diabetes (HCC)  Moderate persistent asthma with exacerbation  OAB (overactive bladder)  Migraine with aura and without status migrainosus, not intractable  Neuropathy  Immunization due -     Flu vaccine HIGH DOSE PF(Fluzone Trivalent)  Encounter for screening mammogram for malignant neoplasm of breast -     3D Screening Mammogram, Left and Right; Future    Return in about 3 months (around 03/12/2024) for DMII & a1c check.   I, Curtis DELENA Boom, FNP, have reviewed all documentation for this visit. The documentation on 12/11/23 for the exam, diagnosis, procedures, and orders are all accurate and complete.   Curtis DELENA Boom, FNP

## 2023-12-11 NOTE — Patient Instructions (Signed)
 Please contact [facility] to schedule your mammogram. Upon results being received our office will contact you. As well as all results can be viewed through your MyChart. Please feel free to contact us  if you have any further questions or concerns.

## 2023-12-16 ENCOUNTER — Ambulatory Visit: Admitting: Neurology

## 2024-01-02 DIAGNOSIS — M1612 Unilateral primary osteoarthritis, left hip: Secondary | ICD-10-CM | POA: Diagnosis not present

## 2024-01-03 ENCOUNTER — Ambulatory Visit: Payer: Self-pay

## 2024-01-03 NOTE — Telephone Encounter (Signed)
 Please call patient and let her know she can increase her lantus  to 44units daily until blood glucose resumes to previous readings. She can then return to her 36units once numbers normalize. If she is continuing to get unreadable elevated readings please seek ED care.

## 2024-01-03 NOTE — Telephone Encounter (Signed)
 FYI Only or Action Required?: Action required by provider: update on patient condition.  Patient was last seen in primary care on 12/11/2023 by Wellington Curtis LABOR, FNP.  Called Nurse Triage reporting Hyperglycemia.  Symptoms began yesterday.  Interventions attempted: Prescription medications: see below and Dietary changes.  Symptoms are: unchanged.  Triage Disposition: Call PCP Now  Patient/caregiver understands and will follow disposition?: Yes          Copied from CRM [id number]. Topic: Clinical - Red Word Triage >> Jan 03, 2024  8:45 AM Victoria A wrote: [facility] that prompted transfer to Nurse Triage: Patient just had Cortisone Shot in left hip and Glucose is so high it would not register on test strip Reason for Disposition  [1] Blood glucose > 300 mg/dL (83.2 mmol/L) AND [7] two or more times in a row    Pt received cortisone shot yesterday and has had elevated blood sugar levels. Pt endorsed had one reading that was too high to register around 0300 this AM.  BG is currently fluctuating in the 350s. Patient has taken all medications as prescribed and would like additional guidance from PCP.  Triager will forward encounter for Curtis, NP 's office to review and advise. Patient verbalized understanding and is expecting call back from office for next steps. Triager also advised that if pt does not hear back from office, to go to UC/ED if glucose monitor is unable to detect value again, along with any worsening sx.  Answer Assessment - Initial Assessment Questions 1. BLOOD GLUCOSE: What is your blood glucose level?      375 @ 0845 Not registered @ 0300 2. ONSET: When did you check the blood glucose?     Last night 3. USUAL RANGE: What is your glucose level usually? (e.g., usual fasting morning value, usual evening value)     Baseline usually around 100-150 in AM 4. KETONES: Do you check for ketones (urine or blood test strips)? If Yes, ask: What does the test show  now?      N/a 5. TYPE 1 or 2:  Do you know what type of diabetes you have?  (e.g., Type 1, Type 2, Gestational; doesn't know)      2 6. INSULIN: Do you take insulin? What type of insulin(s) do you use? What is the mode of delivery? (syringe, pen; injection or pump)?      insulin glargine  (LANTUS  SOLOSTAR) 7. DIABETES PILLS: Do you take any pills for your diabetes? If Yes, ask: Have you missed taking any pills recently?     glipiZIDE  (GLUCOTROL  XL) 8. OTHER SYMPTOMS: Do you have any symptoms? (e.g., fever, frequent urination, difficulty breathing, dizziness, weakness, vomiting)     Slight increase in urination 9. PREGNANCY: Is there any chance you are pregnant? When was your last menstrual period?     N/a  Protocols used: Diabetes - High Blood Sugar-A-AH

## 2024-01-03 NOTE — Telephone Encounter (Signed)
 Pt advised. Verbalized understanding.

## 2024-01-07 ENCOUNTER — Other Ambulatory Visit: Payer: Self-pay | Admitting: Medical Genetics

## 2024-01-08 ENCOUNTER — Other Ambulatory Visit

## 2024-01-09 ENCOUNTER — Other Ambulatory Visit
Admission: RE | Admit: 2024-01-09 | Discharge: 2024-01-09 | Disposition: A | Discharge status: Home / Self Care | Payer: Self-pay | Source: Ambulatory Visit | Attending: Medical Genetics | Admitting: Medical Genetics

## 2024-01-15 ENCOUNTER — Ambulatory Visit (INDEPENDENT_AMBULATORY_CARE_PROVIDER_SITE_OTHER)

## 2024-01-15 VITALS — BP 138/83 | HR 88

## 2024-01-15 DIAGNOSIS — E1122 Type 2 diabetes mellitus with diabetic chronic kidney disease: Secondary | ICD-10-CM | POA: Diagnosis not present

## 2024-01-15 DIAGNOSIS — J301 Allergic rhinitis due to pollen: Secondary | ICD-10-CM | POA: Diagnosis not present

## 2024-01-15 DIAGNOSIS — E1169 Type 2 diabetes mellitus with other specified complication: Secondary | ICD-10-CM | POA: Diagnosis not present

## 2024-01-15 DIAGNOSIS — Z794 Long term (current) use of insulin: Secondary | ICD-10-CM

## 2024-01-15 DIAGNOSIS — J4540 Moderate persistent asthma, uncomplicated: Secondary | ICD-10-CM | POA: Diagnosis not present

## 2024-01-15 DIAGNOSIS — E785 Hyperlipidemia, unspecified: Secondary | ICD-10-CM

## 2024-01-15 DIAGNOSIS — G43109 Migraine with aura, not intractable, without status migrainosus: Secondary | ICD-10-CM

## 2024-01-15 DIAGNOSIS — E1159 Type 2 diabetes mellitus with other circulatory complications: Secondary | ICD-10-CM

## 2024-01-15 DIAGNOSIS — I152 Hypertension secondary to endocrine disorders: Secondary | ICD-10-CM

## 2024-01-15 DIAGNOSIS — N1831 Chronic kidney disease, stage 3a: Secondary | ICD-10-CM | POA: Diagnosis not present

## 2024-01-15 DIAGNOSIS — N3281 Overactive bladder: Secondary | ICD-10-CM | POA: Diagnosis not present

## 2024-01-15 MED ORDER — MOUNJARO 15 MG/0.5ML ~~LOC~~ SOAJ: 15.0000 mg | SUBCUTANEOUS | 1 refills | Status: DC

## 2024-01-15 MED ORDER — ROSUVASTATIN CALCIUM 5 MG PO TABS: 5.0000 mg | ORAL_TABLET | Freq: Every day | ORAL | 0 refills | Status: DC

## 2024-01-15 MED ORDER — OXYBUTYNIN CHLORIDE ER 10 MG PO TB24: 10.0000 mg | ORAL_TABLET | Freq: Every day | ORAL | 0 refills | Status: DC

## 2024-01-15 MED ORDER — PROPRANOLOL HCL ER 60 MG PO CP24: 60.0000 mg | ORAL_CAPSULE | Freq: Every day | ORAL | 0 refills | Status: DC

## 2024-01-15 MED ORDER — MONTELUKAST SODIUM 10 MG PO TABS: 10.0000 mg | ORAL_TABLET | Freq: Every day | ORAL | 0 refills | Status: DC

## 2024-01-15 NOTE — Progress Notes (Signed)
 S:     Reason for visit: ?  Joanna Bishop is a 68 y.o. female with a history of diabetes (type 2), who presents today for a follow up diabetes Face to Face pharmacotherapy visit.? Pertinent PMH also includes HTN, migraines, OAB, insomnia, anxiety, and PUD.   Known DM Complications: nephropathy and peripheral neuropathy   Care Team: Primary Care Provider: Wellington Curtis LABOR, FNP  At last visit with clinical pharmacist on 11/28/23, Mounjaro  was increased from 10 to 12.5 mg weekly and glipizide  was decreased from 10 to 5 mg daily.    In the interim, patient received a corticosteroid injection and BG readings were elevated in the 300 mg/dL. Patient was instructed temporarily increase Lantus  to 44 units daily.   Current diabetes medications include: Lantus  36 units daily, glipizide  ER 5 mg daily, Mounjaro  12.5 mg weekly (Monday) Previous diabetes medications include: metformin  (GI upset) Current hypertension medications include: losartan  100 mg daily, propranolol  ER 60 mg daily  Current hyperlipidemia medications include: rosuvastatin  5 mg daily  Patient reports adherence to taking all medications as prescribed.   Have you been experiencing any side effects to the medications prescribed? no Do you have any problems obtaining medications due to transportation or finances? no Insurance coverage: [employer]  Patient denies hypoglycemic events.  Patient reported dietary habits: Eats 3 meals/day Breakfast: eggs and milk, bowl of cereal Lunch: salad or sandwich Dinner: protein, vegetables Drinks: sugar-free flavored water, Diet Dr. Nunzio *reports granddaughter is very strict on limiting carbohydrate intake   Patient-reported exercise habits: limited d/t leg pain following knee replacement/femur fracture, just started chair aerobics  Hypertension: Patient has a home BP cuff Current blood pressure readings readings: 130s (did not bring log to clinic)  DM Prevention:  Statin: Taking;  moderate intensity.?  ACE/ARB: yes; losartan  Last urinary albumin/creatinine ratio:  Lab Results  Component Value Date   MICRALBCREAT 52 (H) 05/08/2023   MICRALBCREAT 37 (H) 10/18/2022   MICRALBCREAT 187 (H) 05/15/2022   MICRALBCREAT 249 (H) 09/06/2021   MICRALBCREAT 108.9 (H) 12/20/2017   Last eye exam:  Lab Results  Component Value Date   HMDIABEYEEXA No Retinopathy 01/08/2023   Lab Results  Component Value Date   HMDIABEYEEXA No Retinopathy 01/08/2023   Last foot exam: No foot exam found Tobacco Use:  Tobacco Use: Low Risk  (12/11/2023)   Patient History    Smoking Tobacco Use: Never    Smokeless Tobacco Use: Never    Passive Exposure: Not on file   O:  LibreView Report    Vitals:  Wt Readings from Last 3 Encounters:  12/11/23 256 lb 4.8 oz (116.3 kg)  09/09/23 252 lb 11.2 oz (114.6 kg)  07/29/23 250 lb 1.6 oz (113.4 kg)   BP Readings from Last 3 Encounters:  12/11/23 137/80  09/09/23 129/89  06/20/23 (!) 141/64   Pulse Readings from Last 3 Encounters:  12/11/23 90  09/09/23 (!) 101  06/20/23 (!) 103     Labs:?  Lab Results  Component Value Date   HGBA1C 6.7 (A) 12/11/2023   HGBA1C 7.4 (A) 09/09/2023   HGBA1C 8.7 (H) 05/08/2023   GLUCOSE 306 (H) 05/08/2023   MICRALBCREAT 52 (H) 05/08/2023   MICRALBCREAT 37 (H) 10/18/2022   MICRALBCREAT 187 (H) 05/15/2022   CREATININE 1.28 (H) 05/08/2023   CREATININE 1.17 (H) 09/06/2021   CREATININE 1.09 (H) 05/02/2021    Lab Results  Component Value Date   CHOL 133 05/08/2023   LDLCALC 46 05/08/2023   LDLCALC 49  09/06/2021   LDLCALC 82 01/26/2021   HDL 43 05/08/2023   TRIG 289 (H) 05/08/2023   TRIG 288 (H) 09/06/2021   TRIG 267 (H) 01/26/2021   ALT 52 (H) 05/08/2023   ALT 25 09/06/2021   AST 39 05/08/2023   AST 21 09/06/2021      Chemistry      Component Value Date/Time   NA 138 05/08/2023 1115   K 4.4 05/08/2023 1115   CL 101 05/08/2023 1115   CO2 22 05/08/2023 1115   BUN 23 05/08/2023 1115    CREATININE 1.28 (H) 05/08/2023 1115      Component Value Date/Time   CALCIUM  9.5 05/08/2023 1115   ALKPHOS 105 05/08/2023 1115   AST 39 05/08/2023 1115   ALT 52 (H) 05/08/2023 1115   BILITOT 0.6 05/08/2023 1115       The 10-year ASCVD risk score (Arnett DK, et al., 2019) is: 15.9%  Lab Results  Component Value Date   MICRALBCREAT 52 (H) 05/08/2023   MICRALBCREAT 37 (H) 10/18/2022   MICRALBCREAT 187 (H) 05/15/2022   MICRALBCREAT 249 (H) 09/06/2021   MICRALBCREAT 108.9 (H) 12/20/2017    A/P: Diabetes currently controlled with a most recent A1c of 6.7% on 12/11/23. Patient is able to verbalize appropriate hypoglycemia management plan. Medication adherence appears appropriate. GMI is elevated at 8.3% given recent steroid injection, however, BG readings have been improving. Will plan to max out GLP/GIP therapy and discontinue SFU at this time. Will discuss possible SGLT2i therapy at follow up given hx of microalbuminuria.  -Continued basal insulin Lantus  (insulin glargine )  36 units daily.  -Discontinued glipizide  -Increased dose of GLP-1 Mounjaro  (tirzepatide )  from 12.5 to 15 mg weekly.  -Patient educated on purpose, proper use, and potential adverse effects of SGLT2i therapy.  -Extensively discussed pathophysiology of diabetes, recommended lifestyle interventions, dietary effects on blood sugar control.  -Counseled on s/sx of and management of hypoglycemia.  -Next A1c anticipated 02/2024.   ASCVD risk - primary prevention in patient with diabetes. Last LDL is 46 at goal of <70 mg/dL. Moderate intensity statin indicated.  -Continued rosuvastatin  5 mg daily.   Hypertension longstanding currently elevated in clinic, but reported home BP readings close to goal of <130 mmHg. Medication adherence appropriate. -Continued losartan  100 mg daily. -Continued propranolol  ER 60 mg daily  Patient verbalized understanding of treatment plan. Total time patient counseling 30  minutes.  Follow-up:  Pharmacist on 02/12/24 PCP clinic visit on 03/12/24  Peyton CHARLENA Ferries, PharmD, CPP Clinical Pharmacist [facility] [phone]

## 2024-01-16 ENCOUNTER — Telehealth: Payer: Self-pay

## 2024-01-16 DIAGNOSIS — E1122 Type 2 diabetes mellitus with diabetic chronic kidney disease: Secondary | ICD-10-CM

## 2024-01-16 DIAGNOSIS — N3281 Overactive bladder: Secondary | ICD-10-CM

## 2024-01-16 DIAGNOSIS — E1169 Type 2 diabetes mellitus with other specified complication: Secondary | ICD-10-CM

## 2024-01-16 DIAGNOSIS — J301 Allergic rhinitis due to pollen: Secondary | ICD-10-CM

## 2024-01-16 DIAGNOSIS — G43109 Migraine with aura, not intractable, without status migrainosus: Secondary | ICD-10-CM

## 2024-01-16 NOTE — Telephone Encounter (Signed)
 Copied from CRM [id number]. Topic: Clinical - Prescription Issue >> Jan 16, 2024  8:51 AM Donna BRAVO wrote: Reason for CRM: patient calling asking to speak with [facility] pharmacist, medication refill request was sent to incorrect pharmacy.  Medication: oxybutynin  (DITROPAN -XL) 10 MG 24 hr tablet propranolol  ER (INDERAL  LA) 60 MG 24 hr capsule rosuvastatin  (CRESTOR ) 5 MG tablet tirzepatide  (MOUNJARO ) 15 MG/0.5ML Pen gabapentin  (NEURONTIN ) 100 MG capsule losartan  (COZAAR ) 100 MG tablet montelukast  (SINGULAIR ) 10 MG tablet   Please send refills to this pharmacy:  [facility] - [place], [place] - [address] [facility] [address] [place] [address] [place] [address] Phone: [phone] Fax: [phone] Hours: Not open 24 hours

## 2024-01-16 NOTE — Telephone Encounter (Signed)
 Copied from CRM #[id number]. Topic: Clinical - Medication Question >> Jan 15, 2024  4:43 PM Drema MATSU wrote: Reason for CRM: Patient wants to let Isaiah the Pharmacist know that she needs Gabapentitin as well.

## 2024-01-17 ENCOUNTER — Other Ambulatory Visit: Payer: Self-pay | Admitting: Family Medicine

## 2024-01-17 DIAGNOSIS — G629 Polyneuropathy, unspecified: Secondary | ICD-10-CM

## 2024-01-17 DIAGNOSIS — N1831 Chronic kidney disease, stage 3a: Secondary | ICD-10-CM

## 2024-01-17 DIAGNOSIS — E1169 Type 2 diabetes mellitus with other specified complication: Secondary | ICD-10-CM

## 2024-01-17 DIAGNOSIS — J301 Allergic rhinitis due to pollen: Secondary | ICD-10-CM

## 2024-01-17 DIAGNOSIS — G43109 Migraine with aura, not intractable, without status migrainosus: Secondary | ICD-10-CM

## 2024-01-17 DIAGNOSIS — N3281 Overactive bladder: Secondary | ICD-10-CM

## 2024-01-17 DIAGNOSIS — E119 Type 2 diabetes mellitus without complications: Secondary | ICD-10-CM | POA: Diagnosis not present

## 2024-01-17 LAB — OPHTHALMOLOGY REPORT-SCANNED

## 2024-01-17 MED ORDER — PROPRANOLOL HCL ER 60 MG PO CP24: 60.0000 mg | ORAL_CAPSULE | Freq: Every day | ORAL | 0 refills | Status: DC

## 2024-01-17 MED ORDER — MOUNJARO 15 MG/0.5ML ~~LOC~~ SOAJ: 15.0000 mg | SUBCUTANEOUS | 1 refills | Status: DC

## 2024-01-17 MED ORDER — ROSUVASTATIN CALCIUM 5 MG PO TABS: 5.0000 mg | ORAL_TABLET | Freq: Every day | ORAL | 0 refills | Status: DC

## 2024-01-17 MED ORDER — GABAPENTIN 100 MG PO CAPS: 100.0000 mg | ORAL_CAPSULE | Freq: Three times a day (TID) | ORAL | 3 refills | Status: DC

## 2024-01-17 MED ORDER — MONTELUKAST SODIUM 10 MG PO TABS: 10.0000 mg | ORAL_TABLET | Freq: Every day | ORAL | 0 refills | Status: DC

## 2024-01-17 MED ORDER — OXYBUTYNIN CHLORIDE ER 10 MG PO TB24: 10.0000 mg | ORAL_TABLET | Freq: Every day | ORAL | 0 refills | Status: DC

## 2024-01-20 LAB — GENECONNECT MOLECULAR SCREEN: Genetic Analysis Overall Interpretation: NEGATIVE

## 2024-01-24 ENCOUNTER — Telehealth: Payer: Self-pay

## 2024-01-27 ENCOUNTER — Ambulatory Visit: Admitting: Neurology

## 2024-02-11 ENCOUNTER — Ambulatory Visit: Admitting: Neurology

## 2024-02-12 ENCOUNTER — Other Ambulatory Visit

## 2024-02-12 DIAGNOSIS — E1122 Type 2 diabetes mellitus with diabetic chronic kidney disease: Secondary | ICD-10-CM

## 2024-02-12 DIAGNOSIS — Z794 Long term (current) use of insulin: Secondary | ICD-10-CM

## 2024-02-12 DIAGNOSIS — N1831 Chronic kidney disease, stage 3a: Secondary | ICD-10-CM

## 2024-02-12 NOTE — Progress Notes (Signed)
 S:     Reason for visit: ?  Joanna Bishop is a 68 y.o. female with a history of diabetes (type 2), who presents today for a follow up diabetes Telephone pharmacotherapy visit.? Pertinent PMH also includes HTN, migraines, OAB, insomnia, anxiety, and PUD.   Known DM Complications: nephropathy and peripheral neuropathy   Care Team: Primary Care Provider: Isaiah Pepper, MD  At last visit with clinical pharmacist on 01/15/24, Mounjaro  was increased to 15 mg weekly and glipizide  was discontinued.  Today, she reports she had a delay with getting all of her prescriptions from the pharmacy. She increased to Mounjaro  15 mg weekly 3 days ago. Additionally, she ran out of her insulin for about a week until a few days ago.   Current diabetes medications include: Lantus  36 units daily, Mounjaro  15 mg weekly (Monday) Previous diabetes medications include: metformin  (GI upset), glipizide  Current hypertension medications include: losartan  100 mg daily, propranolol  ER 60 mg daily  Current hyperlipidemia medications include: rosuvastatin  5 mg daily  Patient reports adherence to taking all medications as prescribed.   Have you been experiencing any side effects to the medications prescribed? no Do you have any problems obtaining medications due to transportation or finances? no Insurance coverage: [employer]  Patient denies hypoglycemic events.  Patient reported dietary habits: Eats 3 meals/day Breakfast: eggs and milk, bowl of cereal Lunch: salad or sandwich Dinner: protein, vegetables Drinks: sugar-free flavored water, Diet Dr. Nunzio *reports granddaughter is very strict on limiting carbohydrate intake   Patient-reported exercise habits: limited d/t leg pain following knee replacement/femur fracture, just started chair aerobics   DM Prevention:  Statin: Taking; moderate intensity.?  ACE/ARB: yes; losartan  Last urinary albumin/creatinine ratio:  Lab Results  Component Value Date    MICRALBCREAT 52 (H) 05/08/2023   MICRALBCREAT 37 (H) 10/18/2022   MICRALBCREAT 187 (H) 05/15/2022   MICRALBCREAT 249 (H) 09/06/2021   MICRALBCREAT 108.9 (H) 12/20/2017   Last eye exam:  Lab Results  Component Value Date   HMDIABEYEEXA No Retinopathy 01/17/2024   Lab Results  Component Value Date   HMDIABEYEEXA No Retinopathy 01/17/2024   Last foot exam: No foot exam found Tobacco Use:  Tobacco Use: Low Risk (12/11/2023)   Patient History    Smoking Tobacco Use: Never    Smokeless Tobacco Use: Never    Passive Exposure: Not on file   O:  LibreView Report    Vitals:  Wt Readings from Last 3 Encounters:  12/11/23 256 lb 4.8 oz (116.3 kg)  09/09/23 252 lb 11.2 oz (114.6 kg)  07/29/23 250 lb 1.6 oz (113.4 kg)   BP Readings from Last 3 Encounters:  01/15/24 138/83  12/11/23 137/80  09/09/23 129/89   Pulse Readings from Last 3 Encounters:  01/15/24 88  12/11/23 90  09/09/23 (!) 101     Labs:?  Lab Results  Component Value Date   HGBA1C 6.7 (A) 12/11/2023   HGBA1C 7.4 (A) 09/09/2023   HGBA1C 8.7 (H) 05/08/2023   GLUCOSE 306 (H) 05/08/2023   MICRALBCREAT 52 (H) 05/08/2023   MICRALBCREAT 37 (H) 10/18/2022   MICRALBCREAT 187 (H) 05/15/2022   CREATININE 1.28 (H) 05/08/2023   CREATININE 1.17 (H) 09/06/2021   CREATININE 1.09 (H) 05/02/2021    Lab Results  Component Value Date   CHOL 133 05/08/2023   LDLCALC 46 05/08/2023   LDLCALC 49 09/06/2021   LDLCALC 82 01/26/2021   HDL 43 05/08/2023   TRIG 289 (H) 05/08/2023   TRIG 288 (H) 09/06/2021  TRIG 267 (H) 01/26/2021   ALT 52 (H) 05/08/2023   ALT 25 09/06/2021   AST 39 05/08/2023   AST 21 09/06/2021      Chemistry      Component Value Date/Time   NA 138 05/08/2023 1115   K 4.4 05/08/2023 1115   CL 101 05/08/2023 1115   CO2 22 05/08/2023 1115   BUN 23 05/08/2023 1115   CREATININE 1.28 (H) 05/08/2023 1115      Component Value Date/Time   CALCIUM  9.5 05/08/2023 1115   ALKPHOS 105 05/08/2023 1115    AST 39 05/08/2023 1115   ALT 52 (H) 05/08/2023 1115   BILITOT 0.6 05/08/2023 1115       The 10-year ASCVD risk score (Arnett DK, et al., 2019) is: 16.1%  Lab Results  Component Value Date   MICRALBCREAT 52 (H) 05/08/2023   MICRALBCREAT 37 (H) 10/18/2022   MICRALBCREAT 187 (H) 05/15/2022   MICRALBCREAT 249 (H) 09/06/2021   MICRALBCREAT 108.9 (H) 12/20/2017    A/P: Diabetes currently controlled with a most recent A1c of 6.7% on 12/11/23. Patient is able to verbalize appropriate hypoglycemia management plan. Medication adherence appears appropriate. GMI is elevated at 8.9%, given a week lapse in insulin therapy and since she just increased Mounjaro  to 15 mg weekly 3 days ago. No medication changes will be made at this time. Will discuss possible SGLT2i therapy once GMI drops below 8% given hx of microalbuminuria.  -Continued basal insulin Lantus  (insulin glargine )  36 units daily.  -Continued GLP-1 Mounjaro  (tirzepatide )  from 15 mg weekly.  -Patient educated on purpose, proper use, and potential adverse effects of SGLT2i therapy.  -Extensively discussed pathophysiology of diabetes, recommended lifestyle interventions, dietary effects on blood sugar control.  -Counseled on s/sx of and management of hypoglycemia.  -Next A1c anticipated 02/2024.   ASCVD risk - primary prevention in patient with diabetes. Last LDL is 46 at goal of <70 mg/dL. Moderate intensity statin indicated.  -Continued rosuvastatin  5 mg daily.   Patient verbalized understanding of treatment plan. Total time patient counseling 30 minutes.  Follow-up:  Pharmacist on 04/02/24 PCP clinic visit on 03/12/24  Peyton CHARLENA Ferries, PharmD, CPP Clinical Pharmacist [facility] [phone]

## 2024-02-18 ENCOUNTER — Ambulatory Visit: Admitting: Obstetrics and Gynecology

## 2024-03-12 ENCOUNTER — Ambulatory Visit

## 2024-03-12 VITALS — BP 126/88 | HR 96 | Temp 98.1°F | Wt 254.4 lb

## 2024-03-12 DIAGNOSIS — E1169 Type 2 diabetes mellitus with other specified complication: Secondary | ICD-10-CM | POA: Diagnosis not present

## 2024-03-12 DIAGNOSIS — N1831 Chronic kidney disease, stage 3a: Secondary | ICD-10-CM

## 2024-03-12 DIAGNOSIS — J4540 Moderate persistent asthma, uncomplicated: Secondary | ICD-10-CM

## 2024-03-12 DIAGNOSIS — F419 Anxiety disorder, unspecified: Secondary | ICD-10-CM

## 2024-03-12 DIAGNOSIS — G629 Polyneuropathy, unspecified: Secondary | ICD-10-CM | POA: Diagnosis not present

## 2024-03-12 DIAGNOSIS — E1122 Type 2 diabetes mellitus with diabetic chronic kidney disease: Secondary | ICD-10-CM | POA: Diagnosis not present

## 2024-03-12 DIAGNOSIS — Z1211 Encounter for screening for malignant neoplasm of colon: Secondary | ICD-10-CM

## 2024-03-12 DIAGNOSIS — E1159 Type 2 diabetes mellitus with other circulatory complications: Secondary | ICD-10-CM | POA: Diagnosis not present

## 2024-03-12 DIAGNOSIS — Z7985 Long-term (current) use of injectable non-insulin antidiabetic drugs: Secondary | ICD-10-CM

## 2024-03-12 DIAGNOSIS — G43109 Migraine with aura, not intractable, without status migrainosus: Secondary | ICD-10-CM

## 2024-03-12 DIAGNOSIS — N3281 Overactive bladder: Secondary | ICD-10-CM | POA: Diagnosis not present

## 2024-03-12 DIAGNOSIS — I152 Hypertension secondary to endocrine disorders: Secondary | ICD-10-CM | POA: Diagnosis not present

## 2024-03-12 DIAGNOSIS — F322 Major depressive disorder, single episode, severe without psychotic features: Secondary | ICD-10-CM | POA: Insufficient documentation

## 2024-03-12 LAB — POCT GLYCOSYLATED HEMOGLOBIN (HGB A1C): Hemoglobin A1C: 7.5 % — AB (ref 4.0–5.6)

## 2024-03-12 MED ORDER — PROPRANOLOL HCL ER 60 MG PO CP24: 60.0000 mg | ORAL_CAPSULE | Freq: Every day | ORAL | 3 refills | Status: AC

## 2024-03-12 MED ORDER — LOSARTAN POTASSIUM 100 MG PO TABS: 100.0000 mg | ORAL_TABLET | Freq: Every day | ORAL | 3 refills | Status: AC

## 2024-03-12 MED ORDER — MOUNJARO 15 MG/0.5ML ~~LOC~~ SOAJ: 15.0000 mg | SUBCUTANEOUS | 3 refills | Status: AC

## 2024-03-12 MED ORDER — OXYBUTYNIN CHLORIDE ER 10 MG PO TB24: 10.0000 mg | ORAL_TABLET | Freq: Every day | ORAL | 3 refills | Status: AC

## 2024-03-12 MED ORDER — MONTELUKAST SODIUM 10 MG PO TABS: 10.0000 mg | ORAL_TABLET | Freq: Every day | ORAL | 3 refills | Status: AC

## 2024-03-12 MED ORDER — EMPAGLIFLOZIN 10 MG PO TABS: 10.0000 mg | ORAL_TABLET | Freq: Every day | ORAL | 3 refills | Status: AC

## 2024-03-12 MED ORDER — GABAPENTIN 100 MG PO CAPS: 200.0000 mg | ORAL_CAPSULE | Freq: Three times a day (TID) | ORAL | 3 refills | Status: AC

## 2024-03-12 MED ORDER — ROSUVASTATIN CALCIUM 5 MG PO TABS: 5.0000 mg | ORAL_TABLET | Freq: Every day | ORAL | 3 refills | Status: AC

## 2024-03-12 MED ORDER — BUDESONIDE-FORMOTEROL FUMARATE 80-4.5 MCG/ACT IN AERO: 2.0000 | INHALATION_SPRAY | Freq: Two times a day (BID) | RESPIRATORY_TRACT | 3 refills | Status: AC

## 2024-03-12 MED ORDER — LANTUS SOLOSTAR 100 UNIT/ML ~~LOC~~ SOPN: 36.0000 [IU] | PEN_INJECTOR | Freq: Every day | SUBCUTANEOUS | 4 refills | Status: DC

## 2024-03-12 NOTE — Assessment & Plan Note (Signed)
 Chronic, controlled Continue Losartan  100mg  and propranolol  60mg 

## 2024-03-12 NOTE — Assessment & Plan Note (Signed)
 BMI 43. Continue Mounjaro . Discussed importance of healthy weight management.

## 2024-03-12 NOTE — Progress Notes (Signed)
 "     Established patient visit   Patient: Joanna Bishop   DOB: Aug 19, 1955   69 y.o. Female  MRN: [id number] Visit Date: 03/12/2024  Today's healthcare provider: Isaiah DELENA Pepper, MD   Chief Complaint  Patient presents with   Diabetes    Type 2  180-270s sugar   Medication Refill    All of them   Subjective    HPI  Discussed the use of AI scribe software for clinical note transcription with the patient, who gave verbal consent to proceed.  History of Present Illness Joanna Bishop is a 69 year old female with diabetes who presents for follow-up regarding her blood sugar management.  Her A1c is 7.5, an increase from her previous result of 6.0. She attributes this to stopping glipizide  due to false low readings from a faulty blood sugar monitor. Since discontinuing glipizide , her blood sugar levels have increased, ranging between 170 and 250 mg/dL. She has been on Mounjaro  15 mg for over a month and uses a Libre monitor for glucose tracking.  She has a history of kidney issues. Her last kidney function test showed a low GFR. She is concerned about her kidney health.  She inquires about increasing her gabapentin  dosage, which she currently takes 100 mg three times a day for nerve pain. She mentions experiencing symptoms of depression and anxiety. She has a history of being diagnosed with bipolar disorder, which she questions, and has previously been on medications that made her feel like a 'zombie'. She prefers to try increasing gabapentin  to manage her symptoms.  She takes several other medications, including Singulair  for asthma, Symbicort  inhaler, propranolol  for migraines and possibly blood pressure, and oxybutynin  for overactive bladder. She has a history of recurrent urinary tract infections but has not had any in several months. She also mentions a family history of asthma, as both her granddaughters have been diagnosed with it.  She discusses her social history,  including raising her two granddaughters due to her daughter's absence, and her granddaughter's academic achievements. She shares personal anecdotes about her family, including her late husband who had ALS, and her experiences with her daughter's drug addiction.   Medications: Show/hide medication list[1]  Review of Systems as noted in HPI.      Objective    BP 126/88   Pulse 96   Temp 98.1 F (36.7 C) (Oral)   Wt 254 lb 6.4 oz (115.4 kg)   SpO2 95%   BMI 43.67 kg/m    Physical Exam Constitutional:      Appearance: Normal appearance.  HENT:     Head: Normocephalic and atraumatic.     Mouth/Throat:     Mouth: Mucous membranes are moist.  Eyes:     Pupils: Pupils are equal, round, and reactive to light.  Pulmonary:     Effort: Pulmonary effort is normal.  Skin:    General: Skin is warm.  Neurological:     General: No focal deficit present.     Mental Status: She is alert.      Results for orders placed or performed in visit on 03/12/24  POCT HgB A1C  Result Value Ref Range   Hemoglobin A1C 7.5 (A) 4.0 - 5.6 %   HbA1c POC (<> result, manual entry)     HbA1c, POC (prediabetic range)     HbA1c, POC (controlled diabetic range)      Assessment & Plan     Problem List Items Addressed This Visit  Cardiovascular and Mediastinum   Hypertension associated with diabetes (HCC)   Chronic, controlled Continue Losartan  100mg  and propranolol  60mg       Relevant Medications   empagliflozin  (JARDIANCE ) 10 MG TABS tablet   insulin glargine  (LANTUS  SOLOSTAR) 100 UNIT/ML Solostar Pen   losartan  (COZAAR ) 100 MG tablet   propranolol  ER (INDERAL  LA) 60 MG 24 hr capsule   rosuvastatin  (CRESTOR ) 5 MG tablet   tirzepatide  (MOUNJARO ) 15 MG/0.5ML Pen   Migraine with aura and without status migrainosus, not intractable   Relevant Medications   gabapentin  (NEURONTIN ) 100 MG capsule   losartan  (COZAAR ) 100 MG tablet   propranolol  ER (INDERAL  LA) 60 MG 24 hr capsule    rosuvastatin  (CRESTOR ) 5 MG tablet     Respiratory   Asthmatic bronchitis   Relevant Medications   montelukast  (SINGULAIR ) 10 MG tablet   budesonide -formoterol  (SYMBICORT ) 80-4.5 MCG/ACT inhaler     Endocrine   Hyperlipidemia associated with type 2 diabetes mellitus (HCC)   Chronic Rosuvastatin  5 mg daily Continue statin        Relevant Medications   empagliflozin  (JARDIANCE ) 10 MG TABS tablet   insulin glargine  (LANTUS  SOLOSTAR) 100 UNIT/ML Solostar Pen   losartan  (COZAAR ) 100 MG tablet   propranolol  ER (INDERAL  LA) 60 MG 24 hr capsule   rosuvastatin  (CRESTOR ) 5 MG tablet   tirzepatide  (MOUNJARO ) 15 MG/0.5ML Pen   Type 2 diabetes mellitus with stage 3a chronic kidney disease, without long-term current use of insulin (HCC) - Primary   Relevant Medications   empagliflozin  (JARDIANCE ) 10 MG TABS tablet   insulin glargine  (LANTUS  SOLOSTAR) 100 UNIT/ML Solostar Pen   losartan  (COZAAR ) 100 MG tablet   rosuvastatin  (CRESTOR ) 5 MG tablet   tirzepatide  (MOUNJARO ) 15 MG/0.5ML Pen   Other Relevant Orders   POCT HgB A1C (Completed)     Genitourinary   OAB (overactive bladder)   Relevant Medications   oxybutynin  (DITROPAN -XL) 10 MG 24 hr tablet     Other   Anxiety   Obesity, morbid (HCC)   BMI 43. Continue Mounjaro . Discussed importance of healthy weight management.      Relevant Medications   empagliflozin  (JARDIANCE ) 10 MG TABS tablet   insulin glargine  (LANTUS  SOLOSTAR) 100 UNIT/ML Solostar Pen   tirzepatide  (MOUNJARO ) 15 MG/0.5ML Pen   Other Visit Diagnoses       Neuropathy       Relevant Medications   gabapentin  (NEURONTIN ) 100 MG capsule     Stage 3a chronic kidney disease (HCC)       Relevant Medications   empagliflozin  (JARDIANCE ) 10 MG TABS tablet     Screening for colon cancer       Relevant Orders   Ambulatory referral to Gastroenterology      Assessment & Plan Type 2 diabetes mellitus with diabetic chronic kidney disease stage 3a (HCC) A1c at 7.5,  above target. Blood glucose 170-250 mg/dL. Comorbidity of CKD, HTN, HLD, neuropathy. Discontinued glipizide  due to hypoglycemic episodes previously. Taking Mounjaro  15 mg, Lantus  36 units nightly. Jardiance  discussed for blood sugar control and kidney protection. - Started Jardiance  with 30-day supply and refills. Discussed side effects in detail. - Continue Mounjaro  15 mg and Lantus  36 units nightly - Follow up with clinical pharmacist - Follow up in 1 month  Stage 3a chronic kidney disease (HCC) Last eGFR of 46. Will start Jardiance  10mg  as above. Will check GFR at next visit.  Neuropathy Anxiety Patient is taking Gabapentin  100 mg TID. Discussed increasing dose for neuropathy and anxiety.  Caution due to kidney function impact. - Increase gabapentin  to 200 mg at night, then 200 mg TID if tolerated. - Monitor for sedation and adjust dose as needed.  Migraine with aura Managed with propranolol , also for blood pressure. - Continue propranolol  as prescribed.  Moderate persistent asthma Managed with montelukast  and Symbicort  inhaler. - Continue montelukast  and Symbicort  inhaler as prescribed.  Overactive bladder Managed with oxybutynin . - Continue oxybutynin  as prescribed.  General Health Maintenance Due for shingles and tetanus vaccines. Mammogram order in place. Colonoscopy discussed due to family history of colon cancer. - Get shingles and tetanus vaccines at pharmacy. - Contact mammogram provider to schedule. - Referred to GI for colonoscopy consultation.    Return in about 6 weeks (around 04/23/2024) for Follow Up.       Joanna DELENA Pepper, MD  [facility] [phone] (phone) [phone] (fax)     [1]  Outpatient Medications Prior to Visit  Medication Sig   acetaminophen  (TYLENOL ) 650 MG CR tablet Take 650 mg by mouth every 8 (eight) hours as needed for pain.   albuterol  (VENTOLIN  HFA) 108 (90 Base) MCG/ACT inhaler INHALE 1-2 PUFFS BY  MOUTH into THE lungs EVERY 4 TO 6 HOURS AS NEEDED FOR WHEEZING AND/OR SHORTNESS OF BREATH   ipratropium (ATROVENT ) 0.03 % nasal spray Place 2 sprays into both nostrils every 12 (twelve) hours.   Multiple Vitamin (MULTIVITAMIN WITH MINERALS) TABS tablet Take 1 tablet by mouth daily. Centrum Silver Women's 50+   [DISCONTINUED] budesonide -formoterol  (SYMBICORT ) 80-4.5 MCG/ACT inhaler Inhale 2 puffs into the lungs 2 (two) times daily.   [DISCONTINUED] gabapentin  (NEURONTIN ) 100 MG capsule Take 1 capsule (100 mg total) by mouth 3 (three) times daily.   [DISCONTINUED] insulin glargine  (LANTUS  SOLOSTAR) 100 UNIT/ML Solostar Pen Inject 36 Units into the skin daily.   [DISCONTINUED] losartan  (COZAAR ) 100 MG tablet Take 1 tablet by mouth once daily   [DISCONTINUED] montelukast  (SINGULAIR ) 10 MG tablet Take 1 tablet (10 mg total) by mouth at bedtime.   [DISCONTINUED] oxybutynin  (DITROPAN -XL) 10 MG 24 hr tablet Take 1 tablet (10 mg total) by mouth at bedtime.   [DISCONTINUED] propranolol  ER (INDERAL  LA) 60 MG 24 hr capsule Take 1 capsule (60 mg total) by mouth daily.   [DISCONTINUED] rosuvastatin  (CRESTOR ) 5 MG tablet Take 1 tablet (5 mg total) by mouth daily.   [DISCONTINUED] tirzepatide  (MOUNJARO ) 15 MG/0.5ML Pen Inject 15 mg into the skin once a week.   blood glucose meter kit and supplies Dispense based on patient and insurance preference. Use daily as directed. 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   Continuous Glucose Sensor (FREESTYLE LIBRE 3 PLUS SENSOR) MISC Place 1 sensor on the skin every 15 days. Use to check glucose continuously   glucose blood (ONETOUCH VERIO) test strip Use to check blood sugars once daily as instructed   Insulin Pen Needle (BD PEN NEEDLE NANO U/F) 32G X 4 MM MISC Use to inject insulin daily as directed   NON FORMULARY Take 1 capsule by mouth in the morning and at bedtime. AZO Bladder Control Supplement   OneTouch Delica Lancets 33G MISC Use to check blood sugars once daily as directed.    [DISCONTINUED] glipiZIDE  (GLUCOTROL  XL) 5 MG 24 hr tablet Take 1 tablet (5 mg total) by mouth daily with breakfast. (Patient not taking: Reported on 03/12/2024)   No facility-administered medications prior to visit.   "

## 2024-03-12 NOTE — Assessment & Plan Note (Signed)
 Chronic Rosuvastatin  5 mg daily Continue statin

## 2024-03-13 NOTE — Telephone Encounter (Signed)
 SABRA

## 2024-03-23 ENCOUNTER — Other Ambulatory Visit: Payer: Self-pay

## 2024-03-23 ENCOUNTER — Telehealth: Payer: Self-pay

## 2024-03-23 DIAGNOSIS — Z1211 Encounter for screening for malignant neoplasm of colon: Secondary | ICD-10-CM

## 2024-03-23 MED ORDER — PEG 3350-KCL-NA BICARB-NACL 420 G PO SOLR: 4000.0000 mL | Freq: Once | ORAL | 0 refills | Status: AC

## 2024-03-23 NOTE — Telephone Encounter (Signed)
 Gastroenterology Pre-Procedure Review  Request Date: 04/09/24 Requesting Physician: Dr. theophilus  PATIENT REVIEW QUESTIONS: The patient responded to the following health history questions as indicated:    1. Are you having any GI issues? no 2. Do you have a personal history of Polyps? no 3. Do you have a family history of Colon Cancer or Polyps? yes (grandfather colon cancer) 4. Diabetes Mellitus? yes (has been advised to stop mounjaro  7 days prior and stop jardiance  3 days  prior) 5. Joint replacements in the past 12 months?no 6. Major health problems in the past 3 months?no 7. Any artificial heart valves, MVP, or defibrillator?no    MEDICATIONS & ALLERGIES:    Patient reports the following regarding taking any anticoagulation/antiplatelet therapy:   Plavix, Coumadin, Eliquis, Xarelto, Lovenox, Pradaxa, Brilinta, or Effient? no Aspirin ? no  Patient confirms/reports the following medications:  Current Outpatient Medications  Medication Sig Dispense Refill   polyethylene glycol-electrolytes (NULYTELY) 420 g solution Take 4,000 mLs by mouth once for 1 dose. 4000 mL 0   acetaminophen  (TYLENOL ) 650 MG CR tablet Take 650 mg by mouth every 8 (eight) hours as needed for pain.     albuterol  (VENTOLIN  HFA) 108 (90 Base) MCG/ACT inhaler INHALE 1-2 PUFFS BY MOUTH into THE lungs EVERY 4 TO 6 HOURS AS NEEDED FOR WHEEZING AND/OR SHORTNESS OF BREATH 18 g 4   blood glucose meter kit and supplies Dispense based on patient and insurance preference. Use daily as directed. 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 1 each 0   budesonide -formoterol  (SYMBICORT ) 80-4.5 MCG/ACT inhaler Inhale 2 puffs into the lungs 2 (two) times daily. 3 each 3   Continuous Glucose Sensor (FREESTYLE LIBRE 3 PLUS SENSOR) MISC Place 1 sensor on the skin every 15 days. Use to check glucose continuously 2 each 12   empagliflozin  (JARDIANCE ) 10 MG TABS tablet Take 1 tablet (10 mg total) by mouth daily. 30 tablet 3   gabapentin  (NEURONTIN ) 100 MG  capsule Take 2 capsules (200 mg total) by mouth 3 (three) times daily. 180 capsule 3   glucose blood (ONETOUCH VERIO) test strip Use to check blood sugars once daily as instructed 100 each 12   insulin glargine  (LANTUS  SOLOSTAR) 100 UNIT/ML Solostar Pen Inject 36 Units into the skin daily. 33 mL 4   Insulin Pen Needle (BD PEN NEEDLE NANO U/F) 32G X 4 MM MISC Use to inject insulin daily as directed 100 each 0   ipratropium (ATROVENT ) 0.03 % nasal spray Place 2 sprays into both nostrils every 12 (twelve) hours. 30 mL 12   losartan  (COZAAR ) 100 MG tablet Take 1 tablet (100 mg total) by mouth daily. 90 tablet 3   montelukast  (SINGULAIR ) 10 MG tablet Take 1 tablet (10 mg total) by mouth at bedtime. 90 tablet 3   Multiple Vitamin (MULTIVITAMIN WITH MINERALS) TABS tablet Take 1 tablet by mouth daily. Centrum Silver Women's 50+     NON FORMULARY Take 1 capsule by mouth in the morning and at bedtime. AZO Bladder Control Supplement     OneTouch Delica Lancets 33G MISC Use to check blood sugars once daily as directed. 100 each 12   oxybutynin  (DITROPAN -XL) 10 MG 24 hr tablet Take 1 tablet (10 mg total) by mouth at bedtime. 90 tablet 3   propranolol  ER (INDERAL  LA) 60 MG 24 hr capsule Take 1 capsule (60 mg total) by mouth daily. 90 capsule 3   rosuvastatin  (CRESTOR ) 5 MG tablet Take 1 tablet (5 mg total) by mouth daily. 90 tablet 3  tirzepatide  (MOUNJARO ) 15 MG/0.5ML Pen Inject 15 mg into the skin once a week. 6 mL 3   No current facility-administered medications for this visit.    Patient confirms/reports the following allergies:  Allergies[1]  No orders of the defined types were placed in this encounter.   AUTHORIZATION INFORMATION Primary Insurance: 1D#: Group #:  Secondary Insurance: 1D#: Group #:  SCHEDULE INFORMATION: Date: 04/09/24 Time: Location: [facility]    [1]  Allergies Allergen Reactions   Penicillins Swelling, Anaphylaxis and Other (See Comments)

## 2024-04-01 NOTE — Progress Notes (Signed)
 ANETT RANKER                                          MRN: [id number]   04/01/2024   The [facility] Quality Team Specialist reviewed this patient medical record for the purposes of chart review for care gap closure. The following were reviewed: chart review for care gap closure-breast cancer screening.    [facility] Quality Team

## 2024-04-02 ENCOUNTER — Other Ambulatory Visit

## 2024-04-02 DIAGNOSIS — N1831 Chronic kidney disease, stage 3a: Secondary | ICD-10-CM

## 2024-04-02 MED ORDER — LANTUS SOLOSTAR 100 UNIT/ML ~~LOC~~ SOPN: PEN_INJECTOR | SUBCUTANEOUS | 2 refills | Status: AC

## 2024-04-02 NOTE — Progress Notes (Signed)
 "  S:     Reason for visit: ?  Joanna Bishop is a 69 y.o. female with a history of diabetes (type 2), who presents today for a follow up diabetes Telephone pharmacotherapy visit.? Pertinent PMH also includes HTN, migraines, OAB, insomnia, anxiety, and PUD.   Known DM Complications: nephropathy and peripheral neuropathy   Care Team: Primary Care Provider: Isaiah Pepper, MD  At last visit with clinical pharmacist on 02/12/24, she reported adelay with getting all of her prescriptions from the pharmacy. She increased to Mounjaro  15 mg weekly 3 days prior to that visit.  At follow up with her PCP on 03/12/24, she was started on Jardiance  10 mg daily.   Today, she reports she has not yet received Jardiance  from the [facility] pharmacy.   Current diabetes medications include: Lantus  36 units daily, Mounjaro  15 mg weekly (Monday), Jardiance  10 mg daily (not taking) Previous diabetes medications include: metformin  (GI upset), glipizide  Current hypertension medications include: losartan  100 mg daily, propranolol  ER 60 mg daily  Current hyperlipidemia medications include: rosuvastatin  5 mg daily  Patient reports adherence to taking all medications as prescribed.   Have you been experiencing any side effects to the medications prescribed? no Do you have any problems obtaining medications due to transportation or finances? no Insurance coverage: [employer]  Patient denies hypoglycemic events.  Patient reported dietary habits: Eats 3 meals/day Breakfast: eggs and milk, bowl of cereal Lunch: salad or sandwich Dinner: protein, vegetables Drinks: sugar-free flavored water, Diet Dr. Nunzio *reports granddaughter is very strict on limiting carbohydrate intake   Patient-reported exercise habits: limited d/t leg pain following knee replacement/femur fracture, just started chair aerobics   DM Prevention:  Statin: Taking; moderate intensity.?  ACE/ARB: yes; losartan  Last urinary albumin/creatinine  ratio:  Lab Results  Component Value Date   MICRALBCREAT 52 (H) 05/08/2023   MICRALBCREAT 37 (H) 10/18/2022   MICRALBCREAT 187 (H) 05/15/2022   MICRALBCREAT 249 (H) 09/06/2021   MICRALBCREAT 108.9 (H) 12/20/2017   Last eye exam:  Lab Results  Component Value Date   HMDIABEYEEXA No Retinopathy 01/17/2024   Lab Results  Component Value Date   HMDIABEYEEXA No Retinopathy 01/17/2024   Last foot exam: No foot exam found Tobacco Use:  Tobacco Use: Low Risk (03/12/2024)   Patient History    Smoking Tobacco Use: Never    Smokeless Tobacco Use: Never    Passive Exposure: Not on file   O:  [employer] Report     Vitals:  Wt Readings from Last 3 Encounters:  03/12/24 254 lb 6.4 oz (115.4 kg)  12/11/23 256 lb 4.8 oz (116.3 kg)  09/09/23 252 lb 11.2 oz (114.6 kg)   BP Readings from Last 3 Encounters:  03/12/24 126/88  01/15/24 138/83  12/11/23 137/80   Pulse Readings from Last 3 Encounters:  03/12/24 96  01/15/24 88  12/11/23 90     Labs:?  Lab Results  Component Value Date   HGBA1C 7.5 (A) 03/12/2024   HGBA1C 6.7 (A) 12/11/2023   HGBA1C 7.4 (A) 09/09/2023   GLUCOSE 306 (H) 05/08/2023   MICRALBCREAT 52 (H) 05/08/2023   MICRALBCREAT 37 (H) 10/18/2022   MICRALBCREAT 187 (H) 05/15/2022   CREATININE 1.28 (H) 05/08/2023   CREATININE 1.17 (H) 09/06/2021   CREATININE 1.09 (H) 05/02/2021    Lab Results  Component Value Date   CHOL 133 05/08/2023   LDLCALC 46 05/08/2023   LDLCALC 49 09/06/2021   LDLCALC 82 01/26/2021   HDL 43 05/08/2023   TRIG  289 (H) 05/08/2023   TRIG 288 (H) 09/06/2021   TRIG 267 (H) 01/26/2021   ALT 52 (H) 05/08/2023   ALT 25 09/06/2021   AST 39 05/08/2023   AST 21 09/06/2021      Chemistry      Component Value Date/Time   NA 138 05/08/2023 1115   K 4.4 05/08/2023 1115   CL 101 05/08/2023 1115   CO2 22 05/08/2023 1115   BUN 23 05/08/2023 1115   CREATININE 1.28 (H) 05/08/2023 1115      Component Value Date/Time   CALCIUM  9.5  05/08/2023 1115   ALKPHOS 105 05/08/2023 1115   AST 39 05/08/2023 1115   ALT 52 (H) 05/08/2023 1115   BILITOT 0.6 05/08/2023 1115       The 10-year ASCVD risk score (Arnett DK, et al., 2019) is: 13.6%  Lab Results  Component Value Date   MICRALBCREAT 52 (H) 05/08/2023   MICRALBCREAT 37 (H) 10/18/2022   MICRALBCREAT 187 (H) 05/15/2022   MICRALBCREAT 249 (H) 09/06/2021   MICRALBCREAT 108.9 (H) 12/20/2017    A/P: Diabetes currently controlled with a most recent A1c of 7.5% on 03/12/24 (goal <7-7.5% based on age and comorbidities). Patient is able to verbalize appropriate hypoglycemia management plan. Medication adherence appears appropriate. GMI is elevated at 8.6%. Concern with initiation of SGLT2i as GMI remains about 8%, d/t risk of GU infections. Will titrate insulin dose at this time and discuss initiation at follow up.  -Increased dose of basal insulin Lantus  (insulin glargine )  to 42 units daily. Can increase by 2 units every 3 days for fasting BG >130 mg/dL. Max dose of 52 units daily.  -Continued GLP-1 Mounjaro  (tirzepatide )  from 15 mg weekly.  -Hold SGLT2i Jardiance  (empagliflozin ) 10 mg daily  -Patient educated on purpose, proper use, and potential adverse effects of SGLT2i therapy.  -Extensively discussed pathophysiology of diabetes, recommended lifestyle interventions, dietary effects on blood sugar control.  -Counseled on s/sx of and management of hypoglycemia.  -Next A1c anticipated 05/2024.  -Contacted [employer] and confirmed shipment of Jardiance   ASCVD risk - primary prevention in patient with diabetes. Last LDL is 46 at goal of <70 mg/dL. Moderate intensity statin indicated.  -Continued rosuvastatin  5 mg daily.   Patient verbalized understanding of treatment plan. Total time patient counseling 30 minutes.  Follow-up:  Pharmacist on 04/23/24 PCP clinic visit on 04/23/24  Peyton CHARLENA Ferries, PharmD, BCACP, CPP Clinical Pharmacist [facility] [phone]    "

## 2024-04-02 NOTE — Patient Instructions (Signed)
 Thanks for visiting with me today!  HOLD Jardiance  10 mg daily. Increase Lantus  to 42 units daily. Increase by 2 units every 3 days for fasting (morning) blood sugar >130 mg/dL. Max of 52 units daily   Let me know if you have any questions!  Knute Mazzuca E. Marsh, PharmD, BCACP, CPP Clinical Pharmacist [facility] [phone]

## 2024-04-08 ENCOUNTER — Ambulatory Visit: Payer: Medicare HMO

## 2024-04-09 ENCOUNTER — Ambulatory Visit: Admit: 2024-04-09

## 2024-04-23 ENCOUNTER — Ambulatory Visit

## 2024-06-17 ENCOUNTER — Ambulatory Visit
